# Patient Record
Sex: Male | Born: 1965 | Race: White | Hispanic: No | Marital: Married | Smoking: Current every day smoker
Health system: Southern US, Community
[De-identification: ages and names within clinical notes are randomized; demographics above are authoritative.]

## PROBLEM LIST (undated history)

## (undated) DIAGNOSIS — E669 Obesity, unspecified: Secondary | ICD-10-CM

## (undated) DIAGNOSIS — J302 Other seasonal allergic rhinitis: Secondary | ICD-10-CM

## (undated) DIAGNOSIS — R519 Headache, unspecified: Secondary | ICD-10-CM

## (undated) DIAGNOSIS — E7800 Pure hypercholesterolemia, unspecified: Secondary | ICD-10-CM

## (undated) DIAGNOSIS — I2510 Atherosclerotic heart disease of native coronary artery without angina pectoris: Secondary | ICD-10-CM

## (undated) DIAGNOSIS — G4733 Obstructive sleep apnea (adult) (pediatric): Secondary | ICD-10-CM

## (undated) DIAGNOSIS — I4891 Unspecified atrial fibrillation: Secondary | ICD-10-CM

## (undated) DIAGNOSIS — I4819 Other persistent atrial fibrillation: Secondary | ICD-10-CM

## (undated) DIAGNOSIS — Z8719 Personal history of other diseases of the digestive system: Secondary | ICD-10-CM

## (undated) DIAGNOSIS — I5032 Chronic diastolic (congestive) heart failure: Secondary | ICD-10-CM

## (undated) DIAGNOSIS — R9431 Abnormal electrocardiogram [ECG] [EKG]: Secondary | ICD-10-CM

## (undated) DIAGNOSIS — T4145XA Adverse effect of unspecified anesthetic, initial encounter: Secondary | ICD-10-CM

## (undated) DIAGNOSIS — J189 Pneumonia, unspecified organism: Secondary | ICD-10-CM

## (undated) DIAGNOSIS — K219 Gastro-esophageal reflux disease without esophagitis: Secondary | ICD-10-CM

## (undated) DIAGNOSIS — Z720 Tobacco use: Secondary | ICD-10-CM

## (undated) DIAGNOSIS — I10 Essential (primary) hypertension: Secondary | ICD-10-CM

## (undated) DIAGNOSIS — I219 Acute myocardial infarction, unspecified: Secondary | ICD-10-CM

## (undated) DIAGNOSIS — M1990 Unspecified osteoarthritis, unspecified site: Secondary | ICD-10-CM

## (undated) DIAGNOSIS — I639 Cerebral infarction, unspecified: Secondary | ICD-10-CM

## (undated) DIAGNOSIS — T8859XA Other complications of anesthesia, initial encounter: Secondary | ICD-10-CM

## (undated) DIAGNOSIS — R51 Headache: Secondary | ICD-10-CM

## (undated) HISTORY — DX: Obstructive sleep apnea (adult) (pediatric): G47.33

## (undated) HISTORY — DX: Other persistent atrial fibrillation: I48.19

## (undated) HISTORY — DX: Atherosclerotic heart disease of native coronary artery without angina pectoris: I25.10

## (undated) HISTORY — DX: Chronic diastolic (congestive) heart failure: I50.32

## (undated) HISTORY — DX: Cerebral infarction, unspecified: I63.9

## (undated) HISTORY — PX: ESOPHAGOGASTRODUODENOSCOPY (EGD) WITH ESOPHAGEAL DILATION: SHX5812

## (undated) HISTORY — PX: KNEE ARTHROSCOPY: SUR90

## (undated) HISTORY — DX: Obesity, unspecified: E66.9

## (undated) HISTORY — DX: Unspecified atrial fibrillation: I48.91

---
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
## 1996-11-03 HISTORY — PX: REPAIR OF ESOPHAGUS: SHX6062

## 1999-03-29 ENCOUNTER — Ambulatory Visit: Admission: RE | Admit: 1999-03-29 | Discharge: 1999-03-29 | Payer: Self-pay | Admitting: Unknown Physician Specialty

## 2013-05-03 DIAGNOSIS — I219 Acute myocardial infarction, unspecified: Secondary | ICD-10-CM

## 2013-05-03 HISTORY — DX: Acute myocardial infarction, unspecified: I21.9

## 2013-05-30 HISTORY — PX: CORONARY ANGIOPLASTY WITH STENT PLACEMENT: SHX49

## 2013-11-03 HISTORY — PX: LAPAROSCOPIC CHOLECYSTECTOMY: SUR755

## 2016-01-08 ENCOUNTER — Encounter: Payer: Self-pay | Admitting: Internal Medicine

## 2016-05-12 DIAGNOSIS — I2510 Atherosclerotic heart disease of native coronary artery without angina pectoris: Secondary | ICD-10-CM

## 2016-05-12 DIAGNOSIS — I4891 Unspecified atrial fibrillation: Secondary | ICD-10-CM

## 2016-05-12 DIAGNOSIS — D72829 Elevated white blood cell count, unspecified: Secondary | ICD-10-CM

## 2016-05-12 DIAGNOSIS — Z720 Tobacco use: Secondary | ICD-10-CM

## 2016-05-12 DIAGNOSIS — R0789 Other chest pain: Secondary | ICD-10-CM

## 2016-05-12 DIAGNOSIS — E785 Hyperlipidemia, unspecified: Secondary | ICD-10-CM

## 2016-05-12 DIAGNOSIS — E669 Obesity, unspecified: Secondary | ICD-10-CM

## 2016-05-12 DIAGNOSIS — R7989 Other specified abnormal findings of blood chemistry: Secondary | ICD-10-CM

## 2016-05-12 DIAGNOSIS — I10 Essential (primary) hypertension: Secondary | ICD-10-CM

## 2016-05-13 DIAGNOSIS — I4891 Unspecified atrial fibrillation: Secondary | ICD-10-CM | POA: Diagnosis not present

## 2016-05-13 DIAGNOSIS — R7989 Other specified abnormal findings of blood chemistry: Secondary | ICD-10-CM | POA: Diagnosis not present

## 2016-05-13 DIAGNOSIS — R0789 Other chest pain: Secondary | ICD-10-CM | POA: Diagnosis not present

## 2016-05-13 DIAGNOSIS — D72829 Elevated white blood cell count, unspecified: Secondary | ICD-10-CM | POA: Diagnosis not present

## 2016-05-14 DIAGNOSIS — E669 Obesity, unspecified: Secondary | ICD-10-CM

## 2016-05-14 DIAGNOSIS — E785 Hyperlipidemia, unspecified: Secondary | ICD-10-CM

## 2016-05-14 DIAGNOSIS — I2510 Atherosclerotic heart disease of native coronary artery without angina pectoris: Secondary | ICD-10-CM

## 2016-05-14 DIAGNOSIS — I4891 Unspecified atrial fibrillation: Secondary | ICD-10-CM

## 2016-05-14 DIAGNOSIS — I10 Essential (primary) hypertension: Secondary | ICD-10-CM

## 2016-05-14 DIAGNOSIS — I5043 Acute on chronic combined systolic (congestive) and diastolic (congestive) heart failure: Secondary | ICD-10-CM

## 2016-05-14 DIAGNOSIS — K219 Gastro-esophageal reflux disease without esophagitis: Secondary | ICD-10-CM

## 2016-05-15 DIAGNOSIS — E669 Obesity, unspecified: Secondary | ICD-10-CM | POA: Diagnosis not present

## 2016-05-15 DIAGNOSIS — E785 Hyperlipidemia, unspecified: Secondary | ICD-10-CM | POA: Diagnosis not present

## 2016-05-15 DIAGNOSIS — I2510 Atherosclerotic heart disease of native coronary artery without angina pectoris: Secondary | ICD-10-CM | POA: Diagnosis not present

## 2016-05-15 DIAGNOSIS — I4891 Unspecified atrial fibrillation: Secondary | ICD-10-CM | POA: Diagnosis not present

## 2016-05-16 DIAGNOSIS — I4891 Unspecified atrial fibrillation: Secondary | ICD-10-CM | POA: Diagnosis not present

## 2016-05-16 DIAGNOSIS — E669 Obesity, unspecified: Secondary | ICD-10-CM | POA: Diagnosis not present

## 2016-05-16 DIAGNOSIS — I2510 Atherosclerotic heart disease of native coronary artery without angina pectoris: Secondary | ICD-10-CM | POA: Diagnosis not present

## 2016-05-16 DIAGNOSIS — E785 Hyperlipidemia, unspecified: Secondary | ICD-10-CM | POA: Diagnosis not present

## 2016-05-25 NOTE — H&P (Signed)
OFFICE VISIT NOTES COPIED TO EPIC FOR DOCUMENTATION  . History of Present Illness Neldon Labella AGNP-C; 06/16/2016 10:26 AM) The patient is a 50 year old male, accompanied by his wife during the visit, who presents with atrial fibrillation. He has a history of CAD status post PCI and stent placement to left Cx and OM1 in 2014, hypertension, hyperlipidemia, and recent history of tobacco use, quit in March 2017. Family history significant for premature CAD in both parents, mother had CABG at age 80 and father had MI at age 36. He states he has been told in the past he has paroxysmal atrial fibrillation but was told nothing needed to be done about this. He had reported intermittent episodes of sharp left-sided chest pain since coronary angiogram in 2014. As symptoms have persisted, he was scheduled for stress test in March 2017 and was told this was normal. He presented to the emergency department on 05/12/2016 with a one-month history of progressively worsening shortness of breath with exertion and orthopnea. He had been experiencing chest discomfort on exertion over the previous day. He had been previously evaluated for similar symptoms and diagnosed with bronchitis and treated with antibiotics and steroids. Symptoms continued to worsen prompting reevaluation. He was noted to be in A. fib with RVR and BNP was markedly elevated at 2200. He was discharged home after rate control acheived with diltiazem and carvedilol and he was adequately diuresed. Out patient follow up was scheduled to discuss cardioversion after he had been on anticoagulation for 3 weeks. He was referred here by a friend as he had become discontent with his previous cardiologist he was seeing in [place].  Echocardiogram performed during hospitalization revealed reduced LVEF of 40-45%. He continues to report significant fatigue and dyspnea on exertion, however, no longer has abdominal swelling that he had reported during  hospitalization. Continues to have intermittent episodes of sharp chest pain. No PND, orthopnea, edema, dizziness, syncope, or symptoms suggestive of claudication or TIA. He also has a history of familial hyperlipidemia. Per his report, he has been on multiple statins without any change in LDL level. LDL presently in the 140s on Zetia.    Problem List/Past Medical Anderson Malta Sergeant; 06-16-16 9:15 AM) CAD (coronary artery disease) (I25.10)  Stent in 2014 Afib (I48.91)  CHF (congestive heart failure) (I50.9)  Arthritis of knee (M17.10)  HTN (hypertension) (I10)  Sleep apnea (G47.30)   Allergies Anderson Malta Sergeant; 16-Jun-2016 8:55 AM) Erythromycins   Family History Anderson Malta Sergeant; 06-16-2016 8:57 AM) Mother  Deceased. around age 54, cancer, heart issues Father  Deceased. at age 48, chf Sister 1  older, 2 MI's and TIA, diabetes; pt doesnt known age of when these happened  Social History Anderson Malta Sergeant; 2016/06/16 8:59 AM) Current tobacco use  Former smoker. quit 01/07/2015, smoked for 20 years Non Drinker/No Alcohol Use  Marital status  Married. Living Situation  Lives with spouse. Number of Children  2.  Past Surgical History Anderson Malta Sergeant; June 16, 2016 9:01 AM) Appendectomy  knee surgery  Cholecystectomy 04/2014 esophageal reconstruction  Stents placed 2014  Medication History Anderson Malta Sergeant; June 16, 2016 9:14 AM) Eliquis (5MG  Tablet, 1 Oral two times daily) Active. Carvedilol (6.25MG  Tablet, 1 Oral two times daily) Active. DilTIAZem HCl (60MG  Tablet, 1 Oral every 12 hours) Active. Spironolactone (25MG  Tablet, 1 Oral daily) Active. Torsemide (20MG  Tablet, 1 Oral daily) Active. Zolpidem Tartrate (5MG  Tablet, 1 Oral at night) Active. Fexofenadine HCl (180MG  Tablet, 1 Oral as needed) Active. Dulcolax Stool Softener (10mg  Oral as needed) Specific dose unknown -  Active. Calcium Carbonate (Antacid) (500MG  Tablet Chewable, 1 Oral daily)  Active. Cholecalciferol (2000UNIT Capsule, 1 Oral daily) Active. (Vitamin D3) Cyanocobalamin (1000MCG Tablet, 1 Oral daily) Active. (Vitamin B12) Zetia (10MG  Tablet, 1 Oral daily) Active. Garlic (300MG  Tablet, 1 Oral daily) Active. Lisinopril (2.5MG  Tablet, 1 Oral daily) Active. Magnesium Oxide (250MG  Tablet, 1 Oral daily) Active. Multivital (1 Oral daily) Active. Fish Oil (1,400mg  Oral daily) Specific dose unknown - Active. Tumeric Root Extract (1000mg  daily) Active. B Complex + C TR (1 Oral daily) Active. Zinc (100MG  Tablet, 1 Oral daily) Active. Medications Reconciled (List from hospital present)  Diagnostic Studies History (Bridgette Ebony Hail, AGNP-C; 05/23/2016 4:09 PM) Worthy Keeler  05/23/2016: Creatinine 1.19, potassium 5.6 05/12/2016: CBC normal, creatinine 0.9, potassium 4.1, BNP 2160, total cholesterol 225, triglycerides 99, HDL 60, LDL 145, HbA1c 5.9% Echocardiogram 05/13/2016 Mild LVH, LVEF 40-45%. Left atrium moderately dilated. Nuclear stress test 05/27/2013 Only able to achieve 65% of MPHR, stress EKG inconclusive. Focal antero-apical ischemia. Normal LVEF at 52%. Coronary Angiogram 05/30/2013 3.5 x 18 mm Xience DES to dominant distal left circumflex, 2.5 x 12 mm Xience to OM1. Nuclear Stress Test 04/01/2014 Negative for evidence of ischemia. LVEF 51%. Echocardiogram 04/17/2014 LVEF 55-60%. Left atrium is normal in size. Chest X-ray 01/07/2016 Cardiomegaly and pulmonary vascular congestion    Review of Systems (Bridgette Allison AGNP-C; 05/23/2016 10:22 AM) General Present- Fatigue. Not Present- Anorexia and Fever. Respiratory Present- Decreased Exercise Tolerance and Difficulty Breathing on Exertion. Cardiovascular Present- Chest Pain. Not Present- Claudications, Edema, Orthopnea, Palpitations and Paroxysmal Nocturnal Dyspnea. Gastrointestinal Not Present- Black, Tarry Stool, Change in Bowel Habits and Nausea. Neurological Not Present- Focal Neurological  Symptoms and Syncope. Endocrine Not Present- Cold Intolerance, Excessive Sweating, Heat Intolerance and Thyroid Problems. Hematology Not Present- Anemia, Easy Bruising, Petechiae and Prolonged Bleeding.  Vitals Anderson Malta Sergeant; 05/23/2016 9:30 AM) 05/23/2016 8:47 AM Weight: 238 lb Pulse: 50 (Regular)  P.OX: 95% (Room air) BP: 129/50 (Sitting, Left Arm, Standard)  Assessment & Plan (Bridgette Allison AGNP-C; 05/23/2016 4:15 PM) Paroxysmal atrial fibrillation (I48.0) Impression: EKG 05/23/2016: Atrial fibrillation with controlled ventricular response at a rate of 76 bpm, normal axis, diffuse nonspecific T-wave abnormality. Current Plans Complete electrocardiogram ([id number]) METABOLIC PANEL, BASIC ([id number]) Atherosclerosis of native coronary artery of native heart without angina pectoris (I25.10) Story: Coronary angiogram 05/30/2013: 3.5 x 18 mm Xience DES to dominant distal left circumflex, 2.5 x 12 mm Xience to OM1.  Nuclear stress test 04/01/2014: Negative for evidence of ischemia. LVEF 51%.  Nuclear stress test 05/27/2013: only able to achieve 65% of MPHR, stress EKG inconclusive. Focal antero-apical ischemia. Normal LVEF at 52%. S/P PTCA (percutaneous transluminal coronary angioplasty) (Z98.61) OSA (obstructive sleep apnea) (G47.33) Story: Does not use CPAP Chronic diastolic heart failure (99991111) Story: Echocardiogram 05/13/2016: Mild LVH, LVEF 40-45%. Left atrium moderately dilated.  CXR 01/07/2016: cardiomegaly and pulmonary vascular congestion  Echocardiogram 04/17/2014: LVEF 55-60%. Left atrium is normal in size. Current Plans Discontinued Spironolactone 25MG  (hyperkalemia). Benign essential hypertension (I10)  Current Plans Mechanism of underlying disease process and action of medications discussed with the patient. I discussed primary/secondary prevention. He presents with establish care for continued follow-up and management of atrial fibrillation and recent acute on chronic  diastolic heart failure. Rate is controlled, however given persistent fatigue, will plan for cardioversion to try to reestablish sinus rhythm. This will be scheduled after he has completed 3 weeks of anticoagulation (started eliquis July 10th 2017). I have discussed regarding risks benefits rate control vs rhythm control with the patient. Patient understands  cardiac arrest and need for CPR, aspiration pneumonia, but not limited to these.  I'll obtain records from previous cardiologist to review stress test that was performed in March 2017. Based on these records, we'll consider repeat stress testing versus coronary angiogram if symptoms persist. He has a subjective history of multiple statin failure, able to tolerate, however, was apparently ineffective at lowering LDL. He is presently on Zetia with LDL of 140. Will consider PCSK9 inhibitor if we can prove statin failure. Will discuss this further at follow up after cardioversion.  BMP obtained today reveals hyperkalemia, will discontinue spironolactone.  *I have discussed this case with Dr. Einar Gip and he personally examined the patient and participated in formulating the plan.*   Signed by Neldon Labella, AGNP-C (05/23/2016 4:16 PM)

## 2016-05-27 ENCOUNTER — Encounter (HOSPITAL_COMMUNITY)
Admission: RE | Disposition: A | Discharge status: Home / Self Care | Payer: Self-pay | Source: Ambulatory Visit | Attending: Cardiology

## 2016-05-27 ENCOUNTER — Encounter (HOSPITAL_COMMUNITY): Payer: Self-pay

## 2016-05-27 ENCOUNTER — Ambulatory Visit (HOSPITAL_COMMUNITY): Payer: BLUE CROSS/BLUE SHIELD | Admitting: Certified Registered Nurse Anesthetist

## 2016-05-27 ENCOUNTER — Ambulatory Visit (HOSPITAL_COMMUNITY)
Admission: RE | Admit: 2016-05-27 | Discharge: 2016-05-27 | Disposition: A | Discharge status: Home / Self Care | Payer: BLUE CROSS/BLUE SHIELD | Source: Ambulatory Visit | Attending: Cardiology | Admitting: Cardiology

## 2016-05-27 DIAGNOSIS — G4733 Obstructive sleep apnea (adult) (pediatric): Secondary | ICD-10-CM | POA: Diagnosis not present

## 2016-05-27 DIAGNOSIS — I4891 Unspecified atrial fibrillation: Secondary | ICD-10-CM | POA: Diagnosis not present

## 2016-05-27 DIAGNOSIS — I10 Essential (primary) hypertension: Secondary | ICD-10-CM | POA: Diagnosis not present

## 2016-05-27 DIAGNOSIS — I252 Old myocardial infarction: Secondary | ICD-10-CM | POA: Diagnosis not present

## 2016-05-27 DIAGNOSIS — Z87891 Personal history of nicotine dependence: Secondary | ICD-10-CM | POA: Insufficient documentation

## 2016-05-27 DIAGNOSIS — Z6835 Body mass index (BMI) 35.0-35.9, adult: Secondary | ICD-10-CM | POA: Insufficient documentation

## 2016-05-27 HISTORY — DX: Gastro-esophageal reflux disease without esophagitis: K21.9

## 2016-05-27 HISTORY — DX: Acute myocardial infarction, unspecified: I21.9

## 2016-05-27 HISTORY — DX: Essential (primary) hypertension: I10

## 2016-05-27 HISTORY — DX: Adverse effect of unspecified anesthetic, initial encounter: T41.45XA

## 2016-05-27 HISTORY — DX: Other complications of anesthesia, initial encounter: T88.59XA

## 2016-05-27 HISTORY — PX: CARDIOVERSION: SHX1299

## 2016-05-27 SURGERY — CARDIOVERSION
Anesthesia: Monitor Anesthesia Care

## 2016-05-27 MED ORDER — LIDOCAINE 2% (20 MG/ML) 5 ML SYRINGE
INTRAMUSCULAR | Status: AC
  Filled 2016-05-27: qty 5

## 2016-05-27 MED ORDER — PROPOFOL 10 MG/ML IV BOLUS
INTRAVENOUS | Status: AC
  Filled 2016-05-27: qty 60

## 2016-05-27 MED ORDER — SUCCINYLCHOLINE CHLORIDE 200 MG/10ML IV SOSY
PREFILLED_SYRINGE | INTRAVENOUS | Status: AC
  Filled 2016-05-27: qty 20

## 2016-05-27 MED ORDER — PROPOFOL 10 MG/ML IV BOLUS
INTRAVENOUS | Status: DC | PRN
  Administered 2016-05-27: 60 mg via INTRAVENOUS
  Administered 2016-05-27 (×2): 20 mg via INTRAVENOUS

## 2016-05-27 MED ORDER — SODIUM CHLORIDE 0.9 % IV SOLN
INTRAVENOUS | Status: DC | PRN
  Administered 2016-05-27: 13:00:00 via INTRAVENOUS

## 2016-05-27 MED ORDER — LIDOCAINE 2% (20 MG/ML) 5 ML SYRINGE
INTRAMUSCULAR | Status: AC
  Filled 2016-05-27: qty 10

## 2016-05-27 MED ORDER — EPHEDRINE 5 MG/ML INJ
INTRAVENOUS | Status: AC
  Filled 2016-05-27: qty 10

## 2016-05-27 MED ORDER — PHENYLEPHRINE 40 MCG/ML (10ML) SYRINGE FOR IV PUSH (FOR BLOOD PRESSURE SUPPORT)
PREFILLED_SYRINGE | INTRAVENOUS | Status: AC
  Filled 2016-05-27: qty 10

## 2016-05-27 NOTE — CV Procedure (Signed)
Direct current cardioversion:  Indication symptomatic A. Fibrillation.  Procedure: Using 100 mg of IV Propofol  for achieving deep sedation, synchronized direct current cardioversion performed. Patient was delivered with 120, 150, 200  Joules of electricity X 1 with success to NSR. Patient tolerated the procedure well. No immediate complication noted.

## 2016-05-27 NOTE — Interval H&P Note (Signed)
History and Physical Interval Note:  05/27/2016 1:18 PM  Danny Kelley  has presented today for surgery, with the diagnosis of A FIB   The various methods of treatment have been discussed with the patient and family. After consideration of risks, benefits and other options for treatment, the patient has consented to  Procedure(s): CARDIOVERSION (N/A) as a surgical intervention .  The patient's history has been reviewed, patient examined, no change in status, stable for surgery.  I have reviewed the patient's chart and labs.  Questions were answered to the patient's satisfaction.     [name]

## 2016-05-27 NOTE — Transfer of Care (Signed)
Immediate Anesthesia Transfer of Care Note  [name]  Procedure(s) Performed: Procedure(s): CARDIOVERSION (N/A)  Patient Location: Endoscopy Unit  Anesthesia Type:General  Level of Consciousness: awake, alert , oriented and patient cooperative  Airway & Oxygen Therapy: Patient Spontanous Breathing  Post-op Assessment: Report given to RN and Post -op Vital signs reviewed and stable  Post vital signs: Reviewed and stable  Last Vitals:  Vitals:   05/27/16 1207  BP: 115/76  Pulse: 87  Resp: 20    Last Pain: There were no vitals filed for this visit.       Complications: No apparent anesthesia complications

## 2016-05-27 NOTE — Discharge Instructions (Addendum)
Electrical Cardioversion, Care After °Refer to this sheet in the next few weeks. These instructions provide you with information on caring for yourself after your procedure. Your health care provider may also give you more specific instructions. Your treatment has been planned according to current medical practices, but problems sometimes occur. Call your health care provider if you have any problems or questions after your procedure. °WHAT TO EXPECT AFTER THE PROCEDURE °After your procedure, it is typical to have the following sensations: °· Some redness on the skin where the shocks were delivered. If this is tender, a sunburn lotion or hydrocortisone cream may help. °· Possible return of an abnormal heart rhythm within hours or days after the procedure. °HOME CARE INSTRUCTIONS °· Take medicines only as directed by your health care provider. Be sure you understand how and when to take your medicine. °· Learn how to feel your pulse and check it often. °· Limit your activity for 48 hours after the procedure or as directed by your health care provider. °· Avoid or minimize caffeine and other stimulants as directed by your health care provider. °SEEK MEDICAL CARE IF: °· You feel like your heart is beating too fast or your pulse is not regular. °· You have any questions about your medicines. °· You have bleeding that will not stop. °SEEK IMMEDIATE MEDICAL CARE IF: °· You are dizzy or feel faint. °· It is hard to breathe or you feel short of breath. °· There is a change in discomfort in your chest. °· Your speech is slurred or you have trouble moving an arm or leg on one side of your body. °· You get a serious muscle cramp that does not go away. °· Your fingers or toes turn cold or blue. °  °This information is not intended to replace advice given to you by your health care provider. Make sure you discuss any questions you have with your health care provider. °  °Document Released: 08/10/2013 Document Revised: 11/10/2014  Document Reviewed: 08/10/2013 °Elsevier Interactive Patient Education ©2016 [employer] ° °

## 2016-05-29 NOTE — Anesthesia Postprocedure Evaluation (Signed)
Anesthesia Post Note  [name]  Procedure(s) Performed: Procedure(s) (LRB): CARDIOVERSION (N/A)  Patient location during evaluation: Endoscopy Anesthesia Type: General Level of consciousness: awake Pain management: pain level controlled Vital Signs Assessment: post-procedure vital signs reviewed and stable Respiratory status: spontaneous breathing Cardiovascular status: stable Postop Assessment: no signs of nausea or vomiting Anesthetic complications: no    Last Vitals:  Vitals:   05/27/16 1340 05/27/16 1350  BP: 103/75 119/87  Pulse: 79 84  Resp: 20 17  Temp: 36.6 C     Last Pain:  Vitals:   05/27/16 1340  TempSrc: Oral                 Merwyn Hodapp

## 2016-05-29 NOTE — Anesthesia Preprocedure Evaluation (Signed)
Anesthesia Evaluation  Patient identified by MRN, date of birth, ID band Patient awake    Reviewed: Allergy & Precautions, NPO status , Patient's Chart, lab work & pertinent test results  History of Anesthesia Complications Negative for: history of anesthetic complications  Airway Mallampati: III  TM Distance: >3 FB Neck ROM: Full    Dental  (+) Dental Advisory Given   Pulmonary shortness of breath, sleep apnea , former smoker,    breath sounds clear to auscultation       Cardiovascular hypertension, + Past MI  + dysrhythmias Atrial Fibrillation  Rhythm:Irregular     Neuro/Psych negative neurological ROS  negative psych ROS   GI/Hepatic Neg liver ROS, GERD  Medicated and Controlled,  Endo/Other  Morbid obesity  Renal/GU negative Renal ROS     Musculoskeletal   Abdominal   Peds  Hematology   Anesthesia Other Findings   Reproductive/Obstetrics                             Anesthesia Physical Anesthesia Plan  ASA: III  Anesthesia Plan: General   Post-op Pain Management:    Induction: Intravenous  Airway Management Planned: Mask  Additional Equipment: None  Intra-op Plan:   Post-operative Plan:   Informed Consent: I have reviewed the patients History and Physical, chart, labs and discussed the procedure including the risks, benefits and alternatives for the proposed anesthesia with the patient or authorized representative who has indicated his/her understanding and acceptance.   Dental advisory given  Plan Discussed with: CRNA and Surgeon  Anesthesia Plan Comments:         Anesthesia Quick Evaluation

## 2016-06-09 ENCOUNTER — Other Ambulatory Visit: Payer: Self-pay | Admitting: Cardiology

## 2016-06-09 ENCOUNTER — Observation Stay (HOSPITAL_COMMUNITY)
Admission: RE | Admit: 2016-06-09 | Discharge: 2016-06-11 | Disposition: A | Discharge status: Home / Self Care | Payer: BLUE CROSS/BLUE SHIELD | Source: Ambulatory Visit | Attending: Cardiology | Admitting: Cardiology

## 2016-06-09 ENCOUNTER — Encounter (HOSPITAL_COMMUNITY): Payer: Self-pay | Admitting: General Practice

## 2016-06-09 ENCOUNTER — Institutional Professional Consult (permissible substitution): Payer: BLUE CROSS/BLUE SHIELD | Admitting: Neurology

## 2016-06-09 DIAGNOSIS — I2510 Atherosclerotic heart disease of native coronary artery without angina pectoris: Secondary | ICD-10-CM | POA: Diagnosis not present

## 2016-06-09 DIAGNOSIS — Z87891 Personal history of nicotine dependence: Secondary | ICD-10-CM | POA: Insufficient documentation

## 2016-06-09 DIAGNOSIS — G4733 Obstructive sleep apnea (adult) (pediatric): Secondary | ICD-10-CM | POA: Insufficient documentation

## 2016-06-09 DIAGNOSIS — I110 Hypertensive heart disease with heart failure: Secondary | ICD-10-CM | POA: Insufficient documentation

## 2016-06-09 DIAGNOSIS — Z8249 Family history of ischemic heart disease and other diseases of the circulatory system: Secondary | ICD-10-CM | POA: Insufficient documentation

## 2016-06-09 DIAGNOSIS — E785 Hyperlipidemia, unspecified: Secondary | ICD-10-CM | POA: Diagnosis not present

## 2016-06-09 DIAGNOSIS — Z955 Presence of coronary angioplasty implant and graft: Secondary | ICD-10-CM | POA: Insufficient documentation

## 2016-06-09 DIAGNOSIS — I252 Old myocardial infarction: Secondary | ICD-10-CM | POA: Diagnosis not present

## 2016-06-09 DIAGNOSIS — I4891 Unspecified atrial fibrillation: Secondary | ICD-10-CM | POA: Diagnosis present

## 2016-06-09 DIAGNOSIS — I480 Paroxysmal atrial fibrillation: Secondary | ICD-10-CM | POA: Diagnosis present

## 2016-06-09 DIAGNOSIS — Z7901 Long term (current) use of anticoagulants: Secondary | ICD-10-CM | POA: Insufficient documentation

## 2016-06-09 DIAGNOSIS — I5032 Chronic diastolic (congestive) heart failure: Secondary | ICD-10-CM | POA: Insufficient documentation

## 2016-06-09 LAB — BASIC METABOLIC PANEL
Anion gap: 6 (ref 5–15)
BUN: 21 mg/dL — ABNORMAL HIGH (ref 6–20)
CALCIUM: 8.8 mg/dL — AB (ref 8.9–10.3)
CO2: 27 mmol/L (ref 22–32)
CREATININE: 0.85 mg/dL (ref 0.61–1.24)
Chloride: 107 mmol/L (ref 101–111)
Glucose, Bld: 109 mg/dL — ABNORMAL HIGH (ref 65–99)
Potassium: 3.6 mmol/L (ref 3.5–5.1)
SODIUM: 140 mmol/L (ref 135–145)

## 2016-06-09 MED ORDER — SODIUM CHLORIDE 0.9% FLUSH: 3.0000 mL | INTRAVENOUS | Status: DC | PRN

## 2016-06-09 MED ORDER — SODIUM CHLORIDE 0.9% FLUSH
3.0000 mL | Freq: Two times a day (BID) | INTRAVENOUS | Status: DC
  Administered 2016-06-09 – 2016-06-11 (×4): 3 mL via INTRAVENOUS

## 2016-06-09 MED ORDER — ONDANSETRON HCL 4 MG/2ML IJ SOLN: 4.0000 mg | Freq: Four times a day (QID) | INTRAMUSCULAR | Status: DC | PRN

## 2016-06-09 MED ORDER — APIXABAN 5 MG PO TABS
5.0000 mg | ORAL_TABLET | Freq: Two times a day (BID) | ORAL | Status: DC
  Administered 2016-06-09 – 2016-06-11 (×4): 5 mg via ORAL
  Filled 2016-06-09 (×4): qty 1

## 2016-06-09 MED ORDER — SODIUM CHLORIDE 0.9 % IV SOLN: 250.0000 mL | INTRAVENOUS | Status: DC | PRN

## 2016-06-09 MED ORDER — TORSEMIDE 20 MG PO TABS
20.0000 mg | ORAL_TABLET | Freq: Every day | ORAL | Status: DC
  Administered 2016-06-10 – 2016-06-11 (×2): 20 mg via ORAL
  Filled 2016-06-09 (×2): qty 1

## 2016-06-09 MED ORDER — ZOLPIDEM TARTRATE 5 MG PO TABS
5.0000 mg | ORAL_TABLET | Freq: Every day | ORAL | Status: DC
  Administered 2016-06-09: 5 mg via ORAL
  Filled 2016-06-09 (×2): qty 1

## 2016-06-09 MED ORDER — EZETIMIBE 10 MG PO TABS
10.0000 mg | ORAL_TABLET | Freq: Every day | ORAL | Status: DC
  Administered 2016-06-10 – 2016-06-11 (×2): 10 mg via ORAL
  Filled 2016-06-09 (×2): qty 1

## 2016-06-09 MED ORDER — ROSUVASTATIN CALCIUM 10 MG PO TABS
20.0000 mg | ORAL_TABLET | Freq: Every day | ORAL | Status: DC
  Administered 2016-06-10 – 2016-06-11 (×2): 20 mg via ORAL
  Filled 2016-06-09 (×2): qty 2

## 2016-06-09 MED ORDER — SOTALOL HCL 80 MG PO TABS
80.0000 mg | ORAL_TABLET | Freq: Two times a day (BID) | ORAL | Status: DC
  Administered 2016-06-09 – 2016-06-10 (×2): 80 mg via ORAL
  Filled 2016-06-09 (×2): qty 1

## 2016-06-09 MED ORDER — ACETAMINOPHEN 325 MG PO TABS: 650.0000 mg | ORAL_TABLET | ORAL | Status: DC | PRN

## 2016-06-09 NOTE — Progress Notes (Signed)
Pt has arrived to [facility] as a direct admit. Telemetry box has been applied and CCMD has been notified. IV team has been consulted to start an IV. Pt reported he is a difficult stick and would like IV team to start IV. MD and admission nurse have been notified of pt's arrival. Pt oriented to room. Will continue to monitor.   Grant Fontana BSN, RN

## 2016-06-09 NOTE — H&P (Signed)
Danny Kelley is an 50 y.o. male.   Chief Complaint: Fatigue HPI: [name]  is a 50 y.o. male  With history of CAD status post PCI and stent placement to left Cx and OM1 in 2014, hypertension, hyperlipidemia, and recent history of tobacco use, quit in March 2017. Family history significant for premature CAD in both parents, mother had CABG at age 55 and father had MI at age 27. He has obstructive sleep apnea but has not been using CPAP, now scheduled for sleep study.  He has history of paroxysmal atrial fibrillation, hypertension, hyperlipidemia. Patient underwent direct current cardioversion on 05/27/2016, felt well for almost a week until 2 days ago suddenly felt marked generalized weakness and thought that he was back in atrial fibrillation on his OV on 06/05/2016. After long discussion, we decided to initiate Sotalol therapy to see if we could maintain sinus rhythm and now being admitted for therapeutic drug administration.   No new symptoms, patient otherwise doing well.  Denies any chest pain, shortness of breath has been stable, no PND or orthopnea.  No dizziness or syncope.  Tolerating and correlation with Eliquis with the reading diathesis.  Past Medical History:  Diagnosis Date  . Complication of anesthesia    Pt reports "they have a hard time waking me up"  . Coronary artery disease   . GERD (gastroesophageal reflux disease)   . Hypertension   . Myocardial infarction [facility])    June 2014; stents: left PDA, 1st OM  . Paroxysmal atrial fibrillation (HCC)   . Shortness of breath dyspnea   . Sleep apnea     Past Surgical History:  Procedure Laterality Date  . APPENDECTOMY  1984  . CARDIAC CATHETERIZATION    . CARDIOVERSION N/A 05/27/2016   Procedure: CARDIOVERSION;  Surgeon: [name], MD;  Location: [facility];  Service: Cardiovascular;  Laterality: N/A;  . CHOLECYSTECTOMY  2015  . CORONARY STENT PLACEMENT  04/19/2013  . ESOPHAGOGASTRODUODENOSCOPY (EGD) WITH ESOPHAGEAL  DILATION    . KNEE ARTHROSCOPY  1986  . REPAIR OF ESOPHAGUS  1998   perforation of the distal esophagus    History reviewed. No pertinent family history. Social History:  reports that he quit smoking about 4 months ago. His smoking use included Cigarettes and E-cigarettes. He has never used smokeless tobacco. He reports that he does not drink alcohol or use drugs.  Allergies:  Allergies  Allergen Reactions  . Erythromycin Other (See Comments)    Hallucinations     Review of Systems - Negative except fatigue and mild dyspnea  Blood pressure 128/66, pulse 82, temperature 98.3 F (36.8 C), temperature source Oral, resp. rate 18, height '5\' 10"'  (1.778 m), weight 107.7 kg (237 lb 8 oz), SpO2 98 %.   Body mass index is 34.08 kg/m.   General appearance: alert, appears older than stated age, no distress and moderately obese Eyes: negative findings: lids and lashes normal Neck: no adenopathy, no carotid bruit, no JVD, supple, symmetrical, trachea midline and thyroid not enlarged, symmetric, no tenderness/mass/nodules Neck: JVP - normal, carotids 2+= without bruits Resp: clear to auscultation bilaterally Chest wall: no tenderness Cardio: irregularly irregular rhythm, S1, S2 normal and no S3 or S4 GI: soft, non-tender; bowel sounds normal; no masses,  no organomegaly Extremities: extremities normal, atraumatic, no cyanosis or edema Pulses: 2+ and symmetric Skin: Skin color, texture, turgor normal. No rashes or lesions Neurologic: Grossly normal  Results for orders placed or performed during the hospital encounter of 06/09/16 (from  the past 48 hour(s))  Basic metabolic panel     Status: Abnormal   Collection Time: 06/09/16  4:27 PM  Result Value Ref Range   Sodium 140 135 - 145 mmol/L   Potassium 3.6 3.5 - 5.1 mmol/L   Chloride 107 101 - 111 mmol/L   CO2 27 22 - 32 mmol/L   Glucose, Bld 109 (H) 65 - 99 mg/dL   BUN 21 (H) 6 - 20 mg/dL   Creatinine, Ser 0.85 0.61 - 1.24 mg/dL    Calcium 8.8 (L) 8.9 - 10.3 mg/dL   GFR calc non Af Amer >60 >60 mL/min   GFR calc Af Amer >60 >60 mL/min    Comment: (NOTE) The eGFR has been calculated using the CKD EPI equation. This calculation has not been validated in all clinical situations. eGFR's persistently <60 mL/min signify possible Chronic Kidney Disease.    Anion gap 6 5 - 15   EKG (office) 06/05/2016: Atrial fibrillation with controlled ventricular response at the rate of 91 bpm, normal axis, diffuse nonspecific T-wave abnormality.  Echo Oval [place]) 05/13/2016: Mild LVH, LVEF 40-45%.  Left atrium moderately dilated.  Lexiscan sestamibi 01/08/2016: Normal sinus rhythm, nondiagnostic stress EKG, fixed apical defect insistent with scar, apical hypokinesis. LVEF 36%. No evidence of ischemia.  Coronary angiogram 05/30/2013: 3.5 x 18 mm Xience DES to dominant distal left circumflex, 2.5 x 12 mm Xience to OM1.   Medications Prior to Admission  Medication Sig Dispense Refill  . apixaban (ELIQUIS) 5 MG TABS tablet Take 5 mg by mouth 2 (two) times daily.    Marland Kitchen b complex vitamins tablet Take 1 tablet by mouth daily.    . carvedilol (COREG) 6.25 MG tablet Take 6.25 mg by mouth 2 (two) times daily with a meal.    . diltiazem (CARDIZEM) 60 MG tablet Take 60 mg by mouth 2 (two) times daily.    Marland Kitchen ezetimibe (ZETIA) 10 MG tablet Take 10 mg by mouth daily.    . Flaxseed, Linseed, (FLAX SEED OIL PO) Take 2 capsules by mouth daily.    Marland Kitchen MAGNESIUM PO Take 1 tablet by mouth daily.    . Omega-3 Fatty Acids (FISH OIL) 1200 MG CAPS Take 1,200 mg by mouth daily.    . rosuvastatin (CRESTOR) 20 MG tablet Take 20 mg by mouth daily.    Marland Kitchen torsemide (DEMADEX) 20 MG tablet Take 20 mg by mouth daily.    Marland Kitchen zolpidem (AMBIEN) 5 MG tablet Take 5 mg by mouth at bedtime.        Current Facility-Administered Medications:  .  0.9 %  sodium chloride infusion, 250 mL, Intravenous, PRN, [name], MD .  acetaminophen (TYLENOL) tablet 650 mg, 650 mg, Oral, Q4H PRN,  [name], MD .  apixaban (ELIQUIS) tablet 5 mg, 5 mg, Oral, BID, [name], MD .  ondansetron [facility]) injection 4 mg, 4 mg, Intravenous, Q6H PRN, [name], MD .  sodium chloride flush (NS) 0.9 % injection 3 mL, 3 mL, Intravenous, Q12H, [name], MD .  sodium chloride flush (NS) 0.9 % injection 3 mL, 3 mL, Intravenous, PRN, [name], MD .  sotalol (BETAPACE) tablet 80 mg, 80 mg, Oral, Q12H, [name], MD, 80 mg at 06/09/16 1728  Assessment/Plan Paroxysmal atrial fibrillation  Direct current cardioversion 05/27/2016: 120J, 150J & 200J, A. Fib to NSR.  Atherosclerosis of native coronary artery of native heart without angina pectoris   Chronic diastolic heart failure  OSA (obstructive sleep apnea)   Hypertension  Hyperlipidemia  Rec: After long discussions regarding proceeding with antiarrhythmic therapy cessation and reattempting cardioversion, patient is willing to be admitted to the hospital today, I will initiate sotalol.  His other option would be Tikosyn or amiodarone, given his young age of leg avoid amiodarone.  Blood pressure is well controlled, she is now been started on the prednisone therapy recently and will eventually need with profile testing in a month or so.  Encouraged therapy and management of sleep apnea with use of CPAP.  [name], MD 06/09/2016, 8:38 PM [facility] [place] Pager: [phone] Office: [phone] If no answer: Cell:  [phone]

## 2016-06-10 DIAGNOSIS — G4733 Obstructive sleep apnea (adult) (pediatric): Secondary | ICD-10-CM | POA: Diagnosis not present

## 2016-06-10 DIAGNOSIS — E785 Hyperlipidemia, unspecified: Secondary | ICD-10-CM | POA: Diagnosis not present

## 2016-06-10 DIAGNOSIS — I480 Paroxysmal atrial fibrillation: Secondary | ICD-10-CM | POA: Diagnosis not present

## 2016-06-10 DIAGNOSIS — I5032 Chronic diastolic (congestive) heart failure: Secondary | ICD-10-CM | POA: Diagnosis not present

## 2016-06-10 MED ORDER — SOTALOL HCL 80 MG PO TABS
80.0000 mg | ORAL_TABLET | Freq: Once | ORAL | Status: AC
  Administered 2016-06-10: 80 mg via ORAL
  Filled 2016-06-10: qty 1

## 2016-06-10 MED ORDER — SOTALOL HCL 80 MG PO TABS
120.0000 mg | ORAL_TABLET | Freq: Two times a day (BID) | ORAL | Status: DC
  Administered 2016-06-10 – 2016-06-11 (×3): 120 mg via ORAL
  Filled 2016-06-10 (×3): qty 2

## 2016-06-10 NOTE — Progress Notes (Signed)
Danny Kelley at [place] at 0.35. Will administer Sotalol as ordered.

## 2016-06-10 NOTE — Progress Notes (Signed)
At about 1444, Pt stated "I can't catch my breath, it comes and goes, and I feel dizzy." Blood pressure 111/84, pulse (!) 138, temperature 98.5 F (36.9 C), temperature source Oral, resp. rate 20, height 5\' 10"  (1.778 m), weight 107.7 kg (237 lb 8 oz), SpO2 100 %.  [name], MD notified with one time order of Sotalol 80mg  and to give 1700 dose late. Will continue to monitor.

## 2016-06-11 ENCOUNTER — Encounter (HOSPITAL_COMMUNITY)
Admission: RE | Disposition: A | Discharge status: Home / Self Care | Payer: Self-pay | Source: Ambulatory Visit | Attending: Cardiology

## 2016-06-11 ENCOUNTER — Encounter (HOSPITAL_COMMUNITY): Payer: Self-pay

## 2016-06-11 ENCOUNTER — Observation Stay (HOSPITAL_COMMUNITY): Payer: BLUE CROSS/BLUE SHIELD | Admitting: Anesthesiology

## 2016-06-11 ENCOUNTER — Other Ambulatory Visit: Payer: Self-pay

## 2016-06-11 DIAGNOSIS — I480 Paroxysmal atrial fibrillation: Secondary | ICD-10-CM | POA: Diagnosis not present

## 2016-06-11 DIAGNOSIS — E785 Hyperlipidemia, unspecified: Secondary | ICD-10-CM | POA: Diagnosis not present

## 2016-06-11 DIAGNOSIS — G4733 Obstructive sleep apnea (adult) (pediatric): Secondary | ICD-10-CM | POA: Diagnosis not present

## 2016-06-11 DIAGNOSIS — I5032 Chronic diastolic (congestive) heart failure: Secondary | ICD-10-CM | POA: Diagnosis not present

## 2016-06-11 HISTORY — PX: CARDIOVERSION: SHX1299

## 2016-06-11 LAB — MAGNESIUM: MAGNESIUM: 2.2 mg/dL (ref 1.7–2.4)

## 2016-06-11 LAB — BASIC METABOLIC PANEL
ANION GAP: 5 (ref 5–15)
BUN: 14 mg/dL (ref 6–20)
CO2: 27 mmol/L (ref 22–32)
Calcium: 8.8 mg/dL — ABNORMAL LOW (ref 8.9–10.3)
Chloride: 104 mmol/L (ref 101–111)
Creatinine, Ser: 0.82 mg/dL (ref 0.61–1.24)
GFR calc non Af Amer: >60 mL/min (ref >60)
GLUCOSE: 111 mg/dL — AB (ref 65–99)
POTASSIUM: 3.8 mmol/L (ref 3.5–5.1)
Sodium: 136 mmol/L (ref 135–145)

## 2016-06-11 SURGERY — CARDIOVERSION
Anesthesia: General

## 2016-06-11 MED ORDER — IRBESARTAN 150 MG PO TABS
75.0000 mg | ORAL_TABLET | Freq: Every day | ORAL | Status: DC
  Administered 2016-06-11: 75 mg via ORAL
  Filled 2016-06-11: qty 1

## 2016-06-11 MED ORDER — SOTALOL HCL 120 MG PO TABS: 120.0000 mg | ORAL_TABLET | Freq: Two times a day (BID) | ORAL | 2 refills | Status: DC

## 2016-06-11 MED ORDER — LIDOCAINE HCL (CARDIAC) 20 MG/ML IV SOLN
INTRAVENOUS | Status: DC | PRN
  Administered 2016-06-11: 100 mg via INTRAVENOUS

## 2016-06-11 MED ORDER — VALSARTAN-HYDROCHLOROTHIAZIDE 160-12.5 MG PO TABS: 1.0000 | ORAL_TABLET | Freq: Every day | ORAL | 1 refills | Status: DC

## 2016-06-11 MED ORDER — TORSEMIDE 20 MG PO TABS: 20.0000 mg | ORAL_TABLET | Freq: Every day | ORAL | Status: DC | PRN

## 2016-06-11 MED ORDER — SODIUM CHLORIDE 0.9 % IV SOLN
INTRAVENOUS | Status: DC | PRN
  Administered 2016-06-11: 11:00:00 via INTRAVENOUS

## 2016-06-11 MED ORDER — PROPOFOL 10 MG/ML IV BOLUS
INTRAVENOUS | Status: DC | PRN
Start: 2016-06-11 — End: 2016-06-11
  Administered 2016-06-11: 100 mg via INTRAVENOUS

## 2016-06-11 NOTE — Progress Notes (Signed)
Danny Kelley to be D/C'd Home per MD order. Discussed with the patient and all questions fully answered.    Medication List    STOP taking these medications   carvedilol 6.25 MG tablet Commonly known as:  COREG   diltiazem 60 MG tablet Commonly known as:  CARDIZEM     TAKE these medications   b complex vitamins tablet Take 1 tablet by mouth daily.   ELIQUIS 5 MG Tabs tablet Generic drug:  apixaban Take 5 mg by mouth 2 (two) times daily.   ezetimibe 10 MG tablet Commonly known as:  ZETIA Take 10 mg by mouth daily.   Fish Oil 1200 MG Caps Take 1,200 mg by mouth daily.   FLAX SEED OIL PO Take 2 capsules by mouth daily.   MAGNESIUM PO Take 1 tablet by mouth daily.   rosuvastatin 20 MG tablet Commonly known as:  CRESTOR Take 20 mg by mouth daily.   sotalol 120 MG tablet Commonly known as:  BETAPACE Take 1 tablet (120 mg total) by mouth every 12 (twelve) hours.   torsemide 20 MG tablet Commonly known as:  DEMADEX Take 1 tablet (20 mg total) by mouth daily as needed. Take only if leg swelling or shortness of breath What changed:  when to take this  reasons to take this  additional instructions   valsartan-hydrochlorothiazide 160-12.5 MG tablet Commonly known as:  DIOVAN HCT Take 1 tablet by mouth daily.   zolpidem 5 MG tablet Commonly known as:  AMBIEN Take 5 mg by mouth at bedtime.       VVS, Skin clean, dry and intact without evidence of skin break down, no evidence of skin tears noted.  IV catheter discontinued intact. Site without signs and symptoms of complications. Dressing and pressure applied.  An After Visit Summary was printed and given to the patient.  Patient escorted via [place], and D/C home via private auto.  Cyndra Numbers  06/11/2016 5:08 PM

## 2016-06-11 NOTE — Anesthesia Preprocedure Evaluation (Addendum)
Anesthesia Evaluation  Patient identified by MRN, date of birth, ID band Patient awake    Reviewed: Allergy & Precautions, NPO status , Patient's Chart, lab work & pertinent test results  History of Anesthesia Complications Negative for: history of anesthetic complications  Airway Mallampati: II  TM Distance: >3 FB     Dental  (+) Teeth Intact   Pulmonary shortness of breath, sleep apnea , Current Smoker, former smoker,  Quit smoking March 2017   Pulmonary exam normal        Cardiovascular Exercise Tolerance: Poor hypertension, + CAD and + Past MI   Rhythm:Irregular Rate:Abnormal     Neuro/Psych    GI/Hepatic GERD  ,  Endo/Other    Renal/GU      Musculoskeletal   Abdominal (+) + obese,  Abdomen: soft.    Peds  Hematology   Anesthesia Other Findings   Reproductive/Obstetrics                            Anesthesia Physical Anesthesia Plan  ASA: III  Anesthesia Plan: General   Post-op Pain Management:    Induction: Intravenous  Airway Management Planned: Mask  Additional Equipment:   Intra-op Plan:   Post-operative Plan:   Informed Consent: I have reviewed the patients History and Physical, chart, labs and discussed the procedure including the risks, benefits and alternatives for the proposed anesthesia with the patient or authorized representative who has indicated his/her understanding and acceptance.     Plan Discussed with: CRNA and Surgeon  Anesthesia Plan Comments:        Anesthesia Quick Evaluation

## 2016-06-11 NOTE — CV Procedure (Signed)
Direct current cardioversion:  Indication symptomatic A. Fibrillation.  Procedure: Using 100 mg of IV Propofol and 100 IV Lidocaine (for reducing venous pain) for achieving deep sedation, synchronized direct current cardioversion performed. Patient was delivered with 150 x 2 then 200  Joules of electricity X 1 with success to NSR. Patient tolerated the procedure well. No immediate complication noted.

## 2016-06-11 NOTE — Interval H&P Note (Signed)
History and Physical Interval Note:  06/11/2016 12:01 PM  Danny Kelley  has presented today for surgery, with the diagnosis of afib  The various methods of treatment have been discussed with the patient and family. After consideration of risks, benefits and other options for treatment, the patient has consented to  Procedure(s): CARDIOVERSION (N/A) as a surgical intervention .  The patient's history has been reviewed, patient examined, no change in status, stable for surgery.  I have reviewed the patient's chart and labs.  Questions were answered to the patient's satisfaction.     [name]

## 2016-06-11 NOTE — Discharge Summary (Signed)
Physician Discharge Summary  Patient ID: Danny Kelley MRN: [id number] DOB/AGE: 48-Apr-1967 48 y.o.  Admit date: 06/09/2016 Discharge date: 06/11/2016  Primary Discharge Diagnosis 1. Symptomatic PAF. Direct current cardioversion 05/27/2016: 120J, 150J & 200J, A. Fib to NSR. However back in A. Fib on f/u. S/P repeat cardioversion 06/11/2016 on Sotalol to sinus rhythm. CHA2DS2-VASCScore: Risk Score  2,  Yearly risk of stroke  2.2. Recommendation: Anticoagulation Yes  2. Malaise and fatigue and dyspnea 3. Chronic diastolic heart failure 4. OSA sleep study being persued. 5. Hypertension 6. Hyperlipidemia 7. Moderate Obesity. 8. CAD native vessel without angina.  Coronary angiogram 05/30/2013: 3.5 x 18 mm Xience DES to dominant distal left circumflex, 2.5 x 12 mm Xience to OM1. 58. Family h/o premature CAD, in both parents, mother had CABG at age 27 and father had MI at age 53.   Hospital Course:  Danny Kelley  is a 50 y.o. male  With history of CAD status post PCI and stent placement to left Cx and OM1 in 2014, hypertension, hyperlipidemia, and recent history of tobacco use, quit in March 2017. Family history significant for premature CAD in both parents, mother had CABG at age 45 and father had MI at age 60. He has obstructive sleep apnea but has not been using CPAP, now scheduled for sleep study.  He has history of paroxysmal atrial fibrillation, hypertension, hyperlipidemia. OSA not on thearpy, admitted for therapeutic drug administration for sotalol. After he received 5 escalating doses, underwent successful cardioversion on 06/11/16, felt stable for discharge.   Recommendations on discharge: Weight loss, keep sleep study appointment, sotalol and valsartan new and make torsemide PRN. Return to work 06/13/2016.  Discharge Exam: Blood pressure 127/87, pulse 71, temperature 97.7 F (36.5 C), resp. rate 11, height 5\' 10"  (1.778 m), weight 107.7 kg (237 lb 8 oz), SpO2 96 %.  General appearance:  alert, appears older than stated age, no distress and moderately obese Eyes: negative findings: lids and lashes normal Neck: no adenopathy, no carotid bruit, no JVD, supple, symmetrical, trachea midline and thyroid not enlarged, symmetric, no tenderness/mass/nodules Neck: JVP - normal, carotids 2+= without bruits Resp: clear to auscultation bilaterally Chest wall: no tenderness Cardio: regularly rhythm, S1, S2 normal and no S3 or S4 GI: soft, non-tender; bowel sounds normal; no masses,  no organomegaly Extremities: extremities normal, atraumatic, no cyanosis or edema Pulses: 2+ and symmetric Skin: Skin color, texture, turgor normal. No rashes or lesions Neurologic: Grossly normal  Labs:  Recent Labs Lab 06/11/16 1352  NA 136  K 3.8  CL 104  CO2 27  BUN 14  CREATININE 0.82  CALCIUM 8.8*  GLUCOSE 111*   EKG (office) 06/05/2016: Atrial fibrillation with controlled ventricular response at the rate of 91 bpm, normal axis, diffuse nonspecific T-wave abnormality.  EKG 06/11/2016: Normal sinus rhythm at rate of 72 bpm, leftward axis, normal EKG.  No evidence of ischemia, normal QT interval  Echo Oval Linsey) 05/13/2016: Mild LVH, LVEF 40-45%.  Left atrium moderately dilated.  Lexiscan sestamibi 01/08/2016: Normal sinus rhythm, nondiagnostic stress EKG, fixed apical defect insistent with scar, apical hypokinesis. LVEF 36%. No evidence of ischemia.  Coronary angiogram 05/30/2013: 3.5 x 18 mm Xience DES to dominant distal left circumflex, 2.5 x 12 mm Xience to OM1.  FOLLOW UP PLANS AND APPOINTMENTS    Medication List    STOP taking these medications   carvedilol 6.25 MG tablet Commonly known as:  COREG   diltiazem 60 MG tablet Commonly known as:  CARDIZEM  TAKE these medications   b complex vitamins tablet Take 1 tablet by mouth daily.   ELIQUIS 5 MG Tabs tablet Generic drug:  apixaban Take 5 mg by mouth 2 (two) times daily.   ezetimibe 10 MG tablet Commonly known as:   ZETIA Take 10 mg by mouth daily.   Fish Oil 1200 MG Caps Take 1,200 mg by mouth daily.   FLAX SEED OIL PO Take 2 capsules by mouth daily.   MAGNESIUM PO Take 1 tablet by mouth daily.   rosuvastatin 20 MG tablet Commonly known as:  CRESTOR Take 20 mg by mouth daily.   sotalol 120 MG tablet Commonly known as:  BETAPACE Take 1 tablet (120 mg total) by mouth every 12 (twelve) hours.   torsemide 20 MG tablet Commonly known as:  DEMADEX Take 1 tablet (20 mg total) by mouth daily as needed. Take only if leg swelling or shortness of breath What changed:  when to take this  reasons to take this  additional instructions   valsartan-hydrochlorothiazide 160-12.5 MG tablet Commonly known as:  DIOVAN HCT Take 1 tablet by mouth daily.   zolpidem 5 MG tablet Commonly known as:  AMBIEN Take 5 mg by mouth at bedtime.       [name], MD 06/11/2016, 3:45 PM  Pager: [phone] Office: [phone] If no answer: [phone]

## 2016-06-11 NOTE — Anesthesia Postprocedure Evaluation (Signed)
Anesthesia Post Note  [name]  Procedure(s) Performed: Procedure(s) (LRB): CARDIOVERSION (N/A)  Patient location during evaluation: PACU Anesthesia Type: General Level of consciousness: awake and alert Pain management: pain level controlled Vital Signs Assessment: post-procedure vital signs reviewed and stable Respiratory status: spontaneous breathing, nonlabored ventilation, respiratory function stable and patient connected to nasal cannula oxygen Cardiovascular status: blood pressure returned to baseline and stable Postop Assessment: no signs of nausea or vomiting Anesthetic complications: no    Last Vitals:  Vitals:   06/11/16 1052 06/11/16 1218  BP: (!) 138/112   Pulse: 86 72  Resp: 11 15  Temp:  36.5 C    Last Pain:  Vitals:   06/11/16 1052  TempSrc: Oral                 Lonette Stevison DAVID

## 2016-06-11 NOTE — H&P (View-Only) (Signed)
Late entry from yesterday  Subjective:  No specific complaints. Wife present at bedside and is wondering why he is in A. Fib.  Objective:  Vital Signs in the last 24 hours: Temp:  [97.8 F (36.6 C)-98.5 F (36.9 C)] 98.3 F (36.8 C) (08/09 0624) Pulse Rate:  [76-138] 76 (08/09 0624) Resp:  [18-20] 20 (08/09 0624) BP: (111-135)/(84-92) 116/92 (08/09 0624) SpO2:  [96 %-100 %] 96 % (08/09 0624)  Intake/Output from previous day: 08/08 0701 - 08/09 0700 In: 1203 [P.O.:1200; I.V.:3] Out: -    Blood pressure (!) 116/92, pulse 76, temperature 98.3 F (36.8 C), temperature source Oral, resp. rate 20, height 5\' 10"  (1.778 m), weight 107.7 kg (237 lb 8 oz), SpO2 96 %.   Physical Exam:  General appearance: alert, appears older than stated age, no distress and moderately obese Eyes: negative findings: lids and lashes normal Neck: no adenopathy, no carotid bruit, no JVD, supple, symmetrical, trachea midline and thyroid not enlarged, symmetric, no tenderness/mass/nodules Neck: JVP - normal, carotids 2+= without bruits Resp: clear to auscultation bilaterally Chest wall: no tenderness Cardio: irregularly irregular rhythm, S1, S2 normal and no S3 or S4 GI: soft, non-tender; bowel sounds normal; no masses,  no organomegaly Extremities: extremities normal, atraumatic, no cyanosis or edema Pulses: 2+ and symmetric Lab Results: BMP  Recent Labs  06/09/16 1627  NA 140  K 3.6  CL 107  CO2 27  GLUCOSE 109*  BUN 21*  CREATININE 0.85  CALCIUM 8.8*  GFRNONAA >60  GFRAA >60   Cardiac Studies:  EKG:06/10/2016: A. Fib with CVR @ 78/min. LAD, non specific T abnormality.   Echo Oval Linsey) 05/13/2016: Mild LVH, LVEF 40-45%.  Left atrium moderately dilated.  Lexiscan sestamibi 01/08/2016 Oval Linsey): Normal sinus rhythm, nondiagnostic stress EKG, fixed apical defect insistent with scar, apical hypokinesis. LVEF 36%. No evidence of ischemia.  Coronary angiogram 05/30/2013: 3.5 x 18 mm Xience DES  to dominant distal left circumflex, 2.5 x 12 mm Xience to OM1.  Assessment/Plan:  1. Symptomatic PAF. Direct current cardioversion 05/27/2016: 120J, 150J & 200J, A. Fib to NSR. However back in A. Fib on f/u. CHA2DS2-VASCScore: Risk Score  2,  Yearly risk of stroke  2.2. Recommendation: Anticoagulation Yes  2. Malaise and fatigue and dyspnea 3. Chronic diastolic heart failure 4. OSA sleep study being persued. 5. Hypertension 6. Hyperlipidemia 7. Moderate Obesity. 8. CAD native vessel without angina.  Rec: For cardioversion tomorrow. Increase Sotalol to 120 mg BID as patient has had break through RVR on tele. No NSVT, QT stable. Discussed with patient and wife regarding A. Fib risk factors.    [name], M.D. 06/11/2016, 7:09 AM [facility] Pager: [phone] Office: [phone] If no answer: [phone]

## 2016-06-11 NOTE — Progress Notes (Signed)
Late entry from yesterday  Subjective:  No specific complaints. Wife present at bedside and is wondering why he is in A. Fib.  Objective:  Vital Signs in the last 24 hours: Temp:  [97.8 F (36.6 C)-98.5 F (36.9 C)] 98.3 F (36.8 C) (08/09 0624) Pulse Rate:  [76-138] 76 (08/09 0624) Resp:  [18-20] 20 (08/09 0624) BP: (111-135)/(84-92) 116/92 (08/09 0624) SpO2:  [96 %-100 %] 96 % (08/09 0624)  Intake/Output from previous day: 08/08 0701 - 08/09 0700 In: 1203 [P.O.:1200; I.V.:3] Out: -    Blood pressure (!) 116/92, pulse 76, temperature 98.3 F (36.8 C), temperature source Oral, resp. rate 20, height 5\' 10"  (1.778 m), weight 107.7 kg (237 lb 8 oz), SpO2 96 %.   Physical Exam:  General appearance: alert, appears older than stated age, no distress and moderately obese Eyes: negative findings: lids and lashes normal Neck: no adenopathy, no carotid bruit, no JVD, supple, symmetrical, trachea midline and thyroid not enlarged, symmetric, no tenderness/mass/nodules Neck: JVP - normal, carotids 2+= without bruits Resp: clear to auscultation bilaterally Chest wall: no tenderness Cardio: irregularly irregular rhythm, S1, S2 normal and no S3 or S4 GI: soft, non-tender; bowel sounds normal; no masses,  no organomegaly Extremities: extremities normal, atraumatic, no cyanosis or edema Pulses: 2+ and symmetric Lab Results: BMP  Recent Labs  06/09/16 1627  NA 140  K 3.6  CL 107  CO2 27  GLUCOSE 109*  BUN 21*  CREATININE 0.85  CALCIUM 8.8*  GFRNONAA >60  GFRAA >60   Cardiac Studies:  EKG:06/10/2016: A. Fib with CVR @ 78/min. LAD, non specific T abnormality.   Echo Oval Linsey) 05/13/2016: Mild LVH, LVEF 40-45%.  Left atrium moderately dilated.  Lexiscan sestamibi 01/08/2016 Oval Linsey): Normal sinus rhythm, nondiagnostic stress EKG, fixed apical defect insistent with scar, apical hypokinesis. LVEF 36%. No evidence of ischemia.  Coronary angiogram 05/30/2013: 3.5 x 18 mm Xience DES  to dominant distal left circumflex, 2.5 x 12 mm Xience to OM1.  Assessment/Plan:  1. Symptomatic PAF. Direct current cardioversion 05/27/2016: 120J, 150J & 200J, A. Fib to NSR. However back in A. Fib on f/u. CHA2DS2-VASCScore: Risk Score  2,  Yearly risk of stroke  2.2. Recommendation: Anticoagulation Yes  2. Malaise and fatigue and dyspnea 3. Chronic diastolic heart failure 4. OSA sleep study being persued. 5. Hypertension 6. Hyperlipidemia 7. Moderate Obesity. 8. CAD native vessel without angina.  Rec: For cardioversion tomorrow. Increase Sotalol to 120 mg BID as patient has had break through RVR on tele. No NSVT, QT stable. Discussed with patient and wife regarding A. Fib risk factors.    [name], M.D. 06/11/2016, 7:09 AM [facility] Pager: [phone] Office: [phone] If no answer: [phone]

## 2016-06-11 NOTE — Transfer of Care (Signed)
Immediate Anesthesia Transfer of Care Note  [name]  Procedure(s) Performed: Procedure(s): CARDIOVERSION (N/A)  Patient Location: PACU  Anesthesia Type:MAC and General  Level of Consciousness: awake, alert , oriented and patient cooperative  Airway & Oxygen Therapy: Patient Spontanous Breathing and Patient connected to nasal cannula oxygen  Post-op Assessment: Report given to RN, Post -op Vital signs reviewed and stable and Patient moving all extremities  Post vital signs: Reviewed and stable  Last Vitals:  Vitals:   06/11/16 0624 06/11/16 1052  BP: (!) 116/92 (!) 138/112  Pulse: 76 86  Resp: 20 11  Temp: 36.8 C     Last Pain:  Vitals:   06/11/16 1052  TempSrc: Oral         Complications: No apparent anesthesia complications

## 2016-06-12 ENCOUNTER — Encounter (HOSPITAL_COMMUNITY): Payer: Self-pay | Admitting: Cardiology

## 2016-07-14 ENCOUNTER — Observation Stay (HOSPITAL_COMMUNITY)
Admission: EM | Admit: 2016-07-14 | Discharge: 2016-07-15 | Disposition: A | Discharge status: Home / Self Care | Payer: BLUE CROSS/BLUE SHIELD | Attending: Internal Medicine | Admitting: Internal Medicine

## 2016-07-14 ENCOUNTER — Encounter (HOSPITAL_COMMUNITY): Payer: Self-pay

## 2016-07-14 DIAGNOSIS — R7989 Other specified abnormal findings of blood chemistry: Secondary | ICD-10-CM | POA: Diagnosis present

## 2016-07-14 DIAGNOSIS — I480 Paroxysmal atrial fibrillation: Secondary | ICD-10-CM | POA: Diagnosis not present

## 2016-07-14 DIAGNOSIS — I110 Hypertensive heart disease with heart failure: Secondary | ICD-10-CM | POA: Diagnosis not present

## 2016-07-14 DIAGNOSIS — Z7901 Long term (current) use of anticoagulants: Secondary | ICD-10-CM | POA: Insufficient documentation

## 2016-07-14 DIAGNOSIS — R944 Abnormal results of kidney function studies: Secondary | ICD-10-CM | POA: Insufficient documentation

## 2016-07-14 DIAGNOSIS — E876 Hypokalemia: Secondary | ICD-10-CM | POA: Insufficient documentation

## 2016-07-14 DIAGNOSIS — Z6835 Body mass index (BMI) 35.0-35.9, adult: Secondary | ICD-10-CM | POA: Insufficient documentation

## 2016-07-14 DIAGNOSIS — E785 Hyperlipidemia, unspecified: Secondary | ICD-10-CM | POA: Diagnosis not present

## 2016-07-14 DIAGNOSIS — I252 Old myocardial infarction: Secondary | ICD-10-CM | POA: Diagnosis not present

## 2016-07-14 DIAGNOSIS — I10 Essential (primary) hypertension: Secondary | ICD-10-CM

## 2016-07-14 DIAGNOSIS — R55 Syncope and collapse: Secondary | ICD-10-CM | POA: Diagnosis not present

## 2016-07-14 DIAGNOSIS — E668 Other obesity: Secondary | ICD-10-CM | POA: Insufficient documentation

## 2016-07-14 DIAGNOSIS — Z87891 Personal history of nicotine dependence: Secondary | ICD-10-CM | POA: Diagnosis not present

## 2016-07-14 DIAGNOSIS — I2510 Atherosclerotic heart disease of native coronary artery without angina pectoris: Secondary | ICD-10-CM | POA: Diagnosis not present

## 2016-07-14 DIAGNOSIS — R748 Abnormal levels of other serum enzymes: Secondary | ICD-10-CM

## 2016-07-14 DIAGNOSIS — G4733 Obstructive sleep apnea (adult) (pediatric): Secondary | ICD-10-CM | POA: Diagnosis not present

## 2016-07-14 DIAGNOSIS — I959 Hypotension, unspecified: Secondary | ICD-10-CM | POA: Diagnosis not present

## 2016-07-14 DIAGNOSIS — I2583 Coronary atherosclerosis due to lipid rich plaque: Secondary | ICD-10-CM | POA: Insufficient documentation

## 2016-07-14 DIAGNOSIS — Z955 Presence of coronary angioplasty implant and graft: Secondary | ICD-10-CM | POA: Diagnosis not present

## 2016-07-14 DIAGNOSIS — I5032 Chronic diastolic (congestive) heart failure: Secondary | ICD-10-CM | POA: Diagnosis not present

## 2016-07-14 DIAGNOSIS — Z79899 Other long term (current) drug therapy: Secondary | ICD-10-CM | POA: Insufficient documentation

## 2016-07-14 DIAGNOSIS — I4581 Long QT syndrome: Secondary | ICD-10-CM | POA: Diagnosis not present

## 2016-07-14 DIAGNOSIS — R9431 Abnormal electrocardiogram [ECG] [EKG]: Secondary | ICD-10-CM | POA: Diagnosis present

## 2016-07-14 HISTORY — DX: Unspecified osteoarthritis, unspecified site: M19.90

## 2016-07-14 HISTORY — DX: Headache: R51

## 2016-07-14 HISTORY — DX: Pure hypercholesterolemia, unspecified: E78.00

## 2016-07-14 HISTORY — DX: Headache, unspecified: R51.9

## 2016-07-14 HISTORY — DX: Pneumonia, unspecified organism: J18.9

## 2016-07-14 HISTORY — DX: Other seasonal allergic rhinitis: J30.2

## 2016-07-14 HISTORY — DX: Personal history of other diseases of the digestive system: Z87.19

## 2016-07-14 LAB — BASIC METABOLIC PANEL
Anion gap: 11 (ref 5–15)
BUN: 23 mg/dL — ABNORMAL HIGH (ref 6–20)
CHLORIDE: 102 mmol/L (ref 101–111)
CO2: 24 mmol/L (ref 22–32)
CREATININE: 1.36 mg/dL — AB (ref 0.61–1.24)
Calcium: 9.2 mg/dL (ref 8.9–10.3)
Glucose, Bld: 177 mg/dL — ABNORMAL HIGH (ref 65–99)
POTASSIUM: 3.4 mmol/L — AB (ref 3.5–5.1)
SODIUM: 137 mmol/L (ref 135–145)

## 2016-07-14 LAB — CBC
HCT: 50.1 % (ref 39.0–52.0)
Hemoglobin: 17.6 g/dL — ABNORMAL HIGH (ref 13.0–17.0)
MCH: 29.7 pg (ref 26.0–34.0)
MCHC: 35.1 g/dL (ref 30.0–36.0)
MCV: 84.5 fL (ref 78.0–100.0)
PLATELETS: 248 10*3/uL (ref 150–400)
RBC: 5.93 MIL/uL — AB (ref 4.22–5.81)
RDW: 13.6 % (ref 11.5–15.5)
WBC: 10.1 10*3/uL (ref 4.0–10.5)

## 2016-07-14 LAB — URINALYSIS, ROUTINE W REFLEX MICROSCOPIC
Bilirubin Urine: NEGATIVE
GLUCOSE, UA: NEGATIVE mg/dL
Hgb urine dipstick: NEGATIVE
KETONES UR: NEGATIVE mg/dL
LEUKOCYTES UA: NEGATIVE
NITRITE: NEGATIVE
PROTEIN: NEGATIVE mg/dL
Specific Gravity, Urine: 1.01 (ref 1.005–1.030)
pH: 6 (ref 5.0–8.0)

## 2016-07-14 LAB — MAGNESIUM: MAGNESIUM: 2.1 mg/dL (ref 1.7–2.4)

## 2016-07-14 LAB — TROPONIN I

## 2016-07-14 MED ORDER — EZETIMIBE 10 MG PO TABS
10.0000 mg | ORAL_TABLET | Freq: Every day | ORAL | Status: DC
  Administered 2016-07-15: 10 mg via ORAL
  Filled 2016-07-14: qty 1

## 2016-07-14 MED ORDER — ACETAMINOPHEN 325 MG PO TABS
650.0000 mg | ORAL_TABLET | Freq: Four times a day (QID) | ORAL | Status: DC | PRN
Start: 2016-07-14 — End: 2016-07-15
  Administered 2016-07-14: 650 mg via ORAL
  Filled 2016-07-14: qty 2

## 2016-07-14 MED ORDER — ZOLPIDEM TARTRATE 5 MG PO TABS
5.0000 mg | ORAL_TABLET | Freq: Every evening | ORAL | Status: DC | PRN
  Administered 2016-07-15: 5 mg via ORAL
  Filled 2016-07-14: qty 1

## 2016-07-14 MED ORDER — ACETAMINOPHEN 650 MG RE SUPP: 650.0000 mg | Freq: Four times a day (QID) | RECTAL | Status: DC | PRN

## 2016-07-14 MED ORDER — ONDANSETRON HCL 4 MG/2ML IJ SOLN
4.0000 mg | Freq: Once | INTRAMUSCULAR | Status: AC
  Administered 2016-07-14: 4 mg via INTRAVENOUS
  Filled 2016-07-14: qty 2

## 2016-07-14 MED ORDER — MAGNESIUM 200 MG PO TABS: ORAL_TABLET | Freq: Every day | ORAL | Status: DC

## 2016-07-14 MED ORDER — ROSUVASTATIN CALCIUM 10 MG PO TABS
20.0000 mg | ORAL_TABLET | Freq: Every day | ORAL | Status: DC
  Administered 2016-07-15: 20 mg via ORAL
  Filled 2016-07-14: qty 2

## 2016-07-14 MED ORDER — SOTALOL HCL 80 MG PO TABS
80.0000 mg | ORAL_TABLET | Freq: Two times a day (BID) | ORAL | Status: DC
  Administered 2016-07-14: 80 mg via ORAL
  Filled 2016-07-14: qty 1

## 2016-07-14 MED ORDER — SODIUM CHLORIDE 0.9 % IV BOLUS (SEPSIS)
1000.0000 mL | Freq: Once | INTRAVENOUS | Status: AC
  Administered 2016-07-14: 1000 mL via INTRAVENOUS

## 2016-07-14 MED ORDER — ACETAMINOPHEN 325 MG PO TABS
650.0000 mg | ORAL_TABLET | Freq: Once | ORAL | Status: AC
  Administered 2016-07-14: 650 mg via ORAL
  Filled 2016-07-14: qty 2

## 2016-07-14 MED ORDER — APIXABAN 5 MG PO TABS
5.0000 mg | ORAL_TABLET | Freq: Two times a day (BID) | ORAL | Status: DC
  Administered 2016-07-14 – 2016-07-15 (×2): 5 mg via ORAL
  Filled 2016-07-14 (×2): qty 1

## 2016-07-14 MED ORDER — POTASSIUM CHLORIDE CRYS ER 20 MEQ PO TBCR
40.0000 meq | EXTENDED_RELEASE_TABLET | Freq: Once | ORAL | Status: AC
  Administered 2016-07-14: 40 meq via ORAL
  Filled 2016-07-14: qty 2

## 2016-07-14 NOTE — ED Triage Notes (Signed)
Pt. Coming from work via [place] for near syncope. Pt. Sitting in break room when he felt hot and nauseous. Pt. Walked and had to sit down because he felt like he was going to pass out. Pt. Found to be cool and clammy by EMS. Pt. Hx of Afib and sts that he had these symptoms when he had afib before. Pt. Had cardioversion 06/19/16 and once before that. Pt. Initial pressure 90/60, but increased to 126/74 after 245mL normal saline.

## 2016-07-14 NOTE — ED Provider Notes (Signed)
[facility] DEPT Provider Note   CSN: [id number] Arrival date & time: 07/14/16  1622     History   Chief Complaint Chief Complaint  Patient presents with  . Near Syncope    HPI Danny Kelley is a 50 y.o. male.   Illness  This is a new problem. The current episode started 1 to 2 hours ago. The problem occurs constantly. The problem has been resolved. Associated symptoms include headaches. Pertinent negatives include no chest pain and no shortness of breath. Nothing aggravates the symptoms. Nothing relieves the symptoms. He has tried nothing for the symptoms.    Past Medical History:  Diagnosis Date  . Arthritis    "a little in my right knee" (07/14/2016)  . Complication of anesthesia    Pt reports "they have a hard time waking me up"  . Coronary artery disease   . GERD (gastroesophageal reflux disease)    hx  . Headache    "with every heart issue that I have" (07/14/2016)  . High cholesterol   . History of hiatal hernia 1990s   "fixed it w/scope down my throat"  . Hypertension   . Myocardial infarction ([place]) 05/2013   stents: left PDA, 1st OM  . Paroxysmal atrial fibrillation (HCC)   . Pneumonia ~ 1992  . Seasonal allergies    "I take Allegra prn" (07/14/2016)  . Sleep apnea    "test done years ago; never treated" (07/14/2016)    Patient Active Problem List   Diagnosis Date Noted  . Near syncope 07/14/2016  . Coronary artery disease due to lipid rich plaque 07/14/2016  . Essential hypertension 07/14/2016  . Chronic diastolic CHF (congestive heart failure) ([place]) 07/14/2016  . Elevated serum creatinine 07/14/2016  . Hypotension, unspecified 07/14/2016  . Paroxysmal atrial fibrillation ([facility]) 06/09/2016    Past Surgical History:  Procedure Laterality Date  . APPENDECTOMY  1984  . CARDIOVERSION N/A 05/27/2016   Procedure: CARDIOVERSION;  Surgeon: [name], MD;  Location: [facility] ENDOSCOPY;  Service: Cardiovascular;  Laterality: N/A;  . CARDIOVERSION N/A 06/11/2016   Procedure: CARDIOVERSION;  Surgeon: [name], MD;  Location: [facility];  Service: Cardiovascular;  Laterality: N/A;  . CORONARY ANGIOPLASTY WITH STENT PLACEMENT  05/30/2013   "2 stents put in at [facility]" & /stent card  . ESOPHAGOGASTRODUODENOSCOPY (EGD) WITH ESOPHAGEAL DILATION  1990s X 2  . KNEE ARTHROSCOPY Right 1986; ~ 1995 X 2  . LAPAROSCOPIC CHOLECYSTECTOMY  2015  . REPAIR OF ESOPHAGUS  1998   perforation of the distal esophagus from bad heartburn       Home Medications    Prior to Admission medications   Medication Sig Start Date End Date Taking? Authorizing Provider  acetaminophen (TYLENOL) 325 MG tablet Take 325-650 mg by mouth every 6 (six) hours as needed for mild pain or headache.   Yes Historical Provider, MD  apixaban (ELIQUIS) 5 MG TABS tablet Take 5 mg by mouth 2 (two) times daily.   Yes Historical Provider, MD  b complex vitamins tablet Take 1 tablet by mouth daily.   Yes Historical Provider, MD  ezetimibe (ZETIA) 10 MG tablet Take 10 mg by mouth daily.   Yes Historical Provider, MD  Flaxseed, Linseed, (FLAX SEED OIL PO) Take 2 capsules by mouth daily.   Yes Historical Provider, MD  MAGNESIUM PO Take 1 tablet by mouth daily.   Yes Historical Provider, MD  Multiple Vitamins-Minerals (ONE-A-DAY MENS 50+ ADVANTAGE) TABS Take 1 tablet by mouth daily with breakfast.  Yes Historical Provider, MD  rosuvastatin (CRESTOR) 20 MG tablet Take 20 mg by mouth daily.   Yes Historical Provider, MD  sotalol (BETAPACE) 120 MG tablet Take 1 tablet (120 mg total) by mouth every 12 (twelve) hours. 06/11/16  Yes [name], MD  torsemide (DEMADEX) 20 MG tablet Take 1 tablet (20 mg total) by mouth daily as needed. Take only if leg swelling or shortness of breath 06/11/16  Yes [name], MD  valsartan-hydrochlorothiazide (DIOVAN HCT) 160-12.5 MG tablet Take 1 tablet by mouth daily. 06/11/16  Yes [name], MD  zolpidem (AMBIEN) 5 MG tablet Take 5 mg by mouth at bedtime.   Yes Historical  Provider, MD    Family History Family History  Problem Relation Age of Onset  . Heart disease Mother 22    CABG age 51  . Breast cancer Mother   . Tongue cancer Mother   . Diabetes Mother   . Heart attack Father 59  . Diabetes Father   . Lung cancer Father   . Congestive Heart Failure Father   . Lung cancer Paternal Uncle     Social History Social History  Substance Use Topics  . Smoking status: Former Smoker    Packs/day: 1.00    Years: 25.00    Types: Cigarettes, E-cigarettes    Quit date: 01/07/2015  . Smokeless tobacco: Never Used  . Alcohol use No     Allergies   Erythromycin   Review of Systems Review of Systems  Constitutional: Positive for appetite change, diaphoresis and fatigue.  Respiratory: Negative for shortness of breath.   Cardiovascular: Negative for chest pain.  Gastrointestinal: Positive for nausea. Negative for vomiting.  Skin: Positive for pallor.  Neurological: Positive for headaches.  All other systems reviewed and are negative.    Physical Exam Updated Vital Signs BP 119/74 (BP Location: Left Arm)   Pulse 60   Temp 98.1 F (36.7 C) (Oral)   Resp 16   Ht 5\' 9"  (1.753 m)   Wt 240 lb 1.6 oz (108.9 kg)   SpO2 99%   BMI 35.46 kg/m   Physical Exam  Constitutional: He appears well-developed and well-nourished.  HENT:  Head: Normocephalic and atraumatic.  Eyes: Conjunctivae are normal.  Neck: Neck supple.  Cardiovascular: Normal rate and regular rhythm.  Exam reveals no gallop and no friction rub.   No murmur heard. hypotensive  Pulmonary/Chest: Effort normal and breath sounds normal. No respiratory distress.  Abdominal: Soft. There is no tenderness.  Musculoskeletal: Normal range of motion. He exhibits no edema or deformity.  Neurological: He is alert.  Skin: Skin is warm and dry.  Psychiatric: He has a normal mood and affect.  Nursing note and vitals reviewed.    ED Treatments / Results  Labs (all labs ordered are listed,  but only abnormal results are displayed) Labs Reviewed  BASIC METABOLIC PANEL - Abnormal; Notable for the following:       Result Value   Potassium 3.4 (*)    Glucose, Bld 177 (*)    BUN 23 (*)    Creatinine, Ser 1.36 (*)    All other components within normal limits  CBC - Abnormal; Notable for the following:    RBC 5.93 (*)    Hemoglobin 17.6 (*)    All other components within normal limits  URINALYSIS, ROUTINE W REFLEX MICROSCOPIC (NOT AT [facility])  MAGNESIUM  TROPONIN I  TROPONIN I  BASIC METABOLIC PANEL  CBC  SODIUM, URINE, RANDOM  CREATININE, URINE,  RANDOM    EKG  EKG Interpretation  Date/Time:  Monday July 14 2016 16:29:22 EDT Ventricular Rate:  70 PR Interval:    QRS Duration: 113 QT Interval:  473 QTC Calculation: 511 R Axis:   41 Text Interpretation:  Sinus rhythm Ventricular premature complex Borderline intraventricular conduction delay Repol abnrm suggests ischemia, anterolateral Prolonged QT interval Confirmed by [facility] MD, Corene Cornea [phone]) on 07/14/2016 4:53:25 PM       Radiology No results found.  Procedures Procedures (including critical care time)  Medications Ordered in ED Medications  apixaban (ELIQUIS) tablet 5 mg (5 mg Oral Given 07/14/16 2206)  sotalol (BETAPACE) tablet 80 mg (80 mg Oral Given 07/14/16 2206)  ezetimibe (ZETIA) tablet 10 mg (not administered)  rosuvastatin (CRESTOR) tablet 20 mg (not administered)  zolpidem (AMBIEN) tablet 5 mg (not administered)  acetaminophen (TYLENOL) tablet 650 mg (650 mg Oral Given 07/14/16 2214)    Or  acetaminophen (TYLENOL) suppository 650 mg ( Rectal See Alternative 07/14/16 2214)  sodium chloride 0.9 % bolus 1,000 mL (0 mLs Intravenous Stopped 07/14/16 1852)  acetaminophen (TYLENOL) tablet 650 mg (650 mg Oral Given 07/14/16 1833)  sodium chloride 0.9 % bolus 1,000 mL (0 mLs Intravenous Stopped 07/14/16 2014)  ondansetron (ZOFRAN) injection 4 mg (4 mg Intravenous Given 07/14/16 1852)  potassium chloride SA  (K-DUR,KLOR-CON) CR tablet 40 mEq (40 mEq Oral Given 07/14/16 2206)     Initial Impression / Assessment and Plan / ED Course  I have reviewed the triage vital signs and the nursing notes.  Pertinent labs & imaging results that were available during my care of the patient were reviewed by me and considered in my medical decision making (see chart for details).  Near syncopal episode and some intermittent hypotenstion. Possibly over beta blockade. Will speak with Dr. Einar Gip regarding admission.   Clinical Course  Comment By Time  Dr. Einar Gip consulted and agrees with medicine admission for cardiac monitoring 2/2 prolonged QTc. Also recommends decreasing sotalol to 80 mg and he will see patient in morning.  Merrily Pew, MD 09/11 [phone]    Final Clinical Impressions(s) / ED Diagnoses   Final diagnoses:  Near syncope    New Prescriptions Current Discharge Medication List       Merrily Pew, MD 07/15/16 [phone]

## 2016-07-14 NOTE — H&P (Signed)
History and Physical  Patient Name: Danny Kelley     [id number]    DOB: Jul 13, 1966    DOA: 07/14/2016 PCP: No PCP Per Patient   Patient coming from: Home via EMS  Chief Complaint: Lucky Rathke  HPI: Danny Kelley is a 50 y.o. male with a past medical history significant for CAD s/p PCI x2 in 2014, pAF on apixaban and sotalol, and HTN who presents with pre-syncopal symptoms.  The patient experienced some vague malaise and headache over the weekend, but was otherwise in his usual health until this morning when he was walking into work, and had very sudden onset diaphoresis, dizziness (so that he had to hold onto the wall), and nausea. He was so weak and dizzy that he couldn't walk, so EMS were called, and he reports that a first responder at the scene told him his heart rate was "fast", and EMS also found his heart rate fast and irregular and his blood pressure low, and so brought him to the ER.   There was no chest pain, and he felt no palpitations or shortness of breath. Over the weekend he had had no fever, chills, new cough, dysuria, hematuria, or other urinary symptoms.  ED course: -Afebrile, heart rate 70s, respirations 20, blood pressure 90/60, pulse oximetry normal -Na 137, K 3.4, Cr 1.36 (baseline 0.8), WBC 10.1 K, Hgb 17.6, magnesium 2.1, troponin negative -Urinalysis without pyuria, hematuria, bacteriuria -ECG showed sinus rhythm with a PVC and new inverted T waves  Of note, the patient has had paroxysmal Afib for some time, on metoprolol, followed in [place].  He was very symptomatic, however, and so recently was evaluated here in [place] and underwent cardioversion with Dr. Einar Gip last July.  This was successful, but he reverted after 1 week and had repeat cardioversion and started sotalol, which he has been taking regularly, for 1 month without problems.    He also notes some odd, left-sided chest wall swelling (notes it in his left breast and left abdomen, shows a picture on  his phone, of very subtle *possible* chest wall swelling, never associated with leg swelling).  He was taking Demadex for this PRN, took last dose last Friday before this weekend.       ROS: Review of Systems  Constitutional: Positive for chills and diaphoresis. Negative for fever and malaise/fatigue.  Respiratory: Negative for cough, sputum production, shortness of breath and wheezing.   Cardiovascular: Negative for chest pain, palpitations, orthopnea, claudication, leg swelling and PND.  Gastrointestinal: Positive for nausea and vomiting. Negative for abdominal pain and diarrhea.  Genitourinary: Positive for flank pain (after taking Demadex only). Negative for dysuria, frequency, hematuria and urgency.  Musculoskeletal: Negative for myalgias.  Neurological: Positive for dizziness.  All other systems reviewed and are negative.         Past Medical History:  Diagnosis Date  . Complication of anesthesia    Pt reports "they have a hard time waking me up"  . Coronary artery disease   . GERD (gastroesophageal reflux disease)   . Hypertension   . Myocardial infarction [facility])    June 2014; stents: left PDA, 1st OM  . Paroxysmal atrial fibrillation (HCC)   . Shortness of breath dyspnea   . Sleep apnea     Past Surgical History:  Procedure Laterality Date  . APPENDECTOMY  1984  . CARDIAC CATHETERIZATION    . CARDIOVERSION N/A 05/27/2016   Procedure: CARDIOVERSION;  Surgeon: [name], MD;  Location: [facility];  Service:  Cardiovascular;  Laterality: N/A;  . CARDIOVERSION N/A 06/11/2016   Procedure: CARDIOVERSION;  Surgeon: [name], MD;  Location: [facility];  Service: Cardiovascular;  Laterality: N/A;  . CHOLECYSTECTOMY  2015  . CORONARY STENT PLACEMENT  04/19/2013  . ESOPHAGOGASTRODUODENOSCOPY (EGD) WITH ESOPHAGEAL DILATION    . KNEE ARTHROSCOPY  1986  . REPAIR OF ESOPHAGUS  1998   perforation of the distal esophagus    Social History: Patient lives with his wife and  son.  The patient walks unassisted.  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  He is a former smoker.  Lives in [place], [place].  No alcohol.  Allergies  Allergen Reactions  . Erythromycin Other (See Comments)    Hallucinations     Family history: family history includes Breast cancer in his mother; Congestive Heart Failure in his father; Diabetes in his father and mother; Heart attack (age of onset: 53) in his father; Heart disease (age of onset: 68) in his mother; Lung cancer in his father and paternal uncle; Tongue cancer in his mother.  Prior to Admission medications   Medication Sig Start Date End Date Taking? Authorizing Provider  acetaminophen (TYLENOL) 325 MG tablet Take 325-650 mg by mouth every 6 (six) hours as needed for mild pain or headache.   Yes Historical Provider, MD  apixaban (ELIQUIS) 5 MG TABS tablet Take 5 mg by mouth 2 (two) times daily.   Yes Historical Provider, MD  b complex vitamins tablet Take 1 tablet by mouth daily.   Yes Historical Provider, MD  ezetimibe (ZETIA) 10 MG tablet Take 10 mg by mouth daily.   Yes Historical Provider, MD  Flaxseed, Linseed, (FLAX SEED OIL PO) Take 2 capsules by mouth daily.   Yes Historical Provider, MD  MAGNESIUM PO Take 1 tablet by mouth daily.   Yes Historical Provider, MD  Multiple Vitamins-Minerals (ONE-A-DAY MENS 50+ ADVANTAGE) TABS Take 1 tablet by mouth daily with breakfast.   Yes Historical Provider, MD  rosuvastatin (CRESTOR) 20 MG tablet Take 20 mg by mouth daily.   Yes Historical Provider, MD  sotalol (BETAPACE) 120 MG tablet Take 1 tablet (120 mg total) by mouth every 12 (twelve) hours. 06/11/16  Yes [name], MD  torsemide (DEMADEX) 20 MG tablet Take 1 tablet (20 mg total) by mouth daily as needed. Take only if leg swelling or shortness of breath 06/11/16  Yes [name], MD  valsartan-hydrochlorothiazide (DIOVAN HCT) 160-12.5 MG tablet Take 1 tablet by mouth daily. 06/11/16  Yes [name], MD  zolpidem (AMBIEN) 5 MG  tablet Take 5 mg by mouth at bedtime.   Yes Historical Provider, MD       Physical Exam: BP 128/70   Pulse 58   Temp 97.8 F (36.6 C) (Oral)   Resp 15   Ht 5\' 9"  (1.753 m)   Wt 108 kg (238 lb)   SpO2 97%   BMI 35.15 kg/m  General appearance: Well-developed, adult male, alert and in no acte distress.  Has some chills when I first enter, these resolve.   Eyes: Anicteric, conjunctiva pink, lids and lashes normal. PERRL.    ENT: No nasal deformity, discharge, epistaxis.  Hearing normal. OP moist without lesions.   Neck: No neck masses.  Trachea midline.  No thyromegaly/tenderness. Lymph: No cervical or supraclavicular lymphadenopathy. Skin: Warm and dry.  No jaundice.  No suspicious rashes or lesions. Cardiac: RRR, nl S1-S2, no murmurs appreciated.  Capillary refill is brisk.  JVP not visible.  No  LE edema.  Radial and DP pulses 2+ and symmetric. Respiratory: Normal respiratory rate and rhythm.  CTAB without rales or wheezes. Abdomen: Abdomen soft.  No TTP. No ascites, distension, hepatosplenomegaly.   MSK: No deformities or effusions.  No cyanosis or clubbing. Neuro: Cranial nerves normal.  Sensation intact to light touch. Speech is fluent.  Muscle strength 5/5 in upper and lower extremities.    Psych: Sensorium intact and responding to questions, attention normal.  Behavior appropriate.  Affect normal.  Judgment and insight appear normal.     Labs on Admission:  I have personally reviewed following labs and imaging studies: CBC:  Recent Labs Lab 07/14/16 1630  WBC 10.1  HGB 17.6*  HCT 50.1  MCV 84.5  PLT [id number]   Basic Metabolic Panel:  Recent Labs Lab 07/14/16 1630  NA 137  K 3.4*  CL 102  CO2 24  GLUCOSE 177*  BUN 23*  CREATININE 1.36*  CALCIUM 9.2  MG 2.1   GFR: Estimated Creatinine Clearance: 81.3 mL/min (by C-G formula based on SCr of 1.36 mg/dL).  Cardiac Enzymes:  Recent Labs Lab 07/14/16 1759  TROPONINI <0.03        EKG: Independently  reviewed. Rate 70, QTc 511, prolonged.  New T wave inversions, diffusely.  Single PVC, otherwise sinus rhtythm.    Assessment/Plan 1. Near syncope and hypotension:  Suspect the patient had an episode of arrhythmia that caused his symptoms. It is also possible he was hypovolemic (although only took a single dose Demadex last Friday).  He improved rapidly on arrival to the ER, at which time he was back in sinus rhythm and was given fluids. No evidence of infection.  Cardiogenic shock doubted.  -Monitor on telemetry for Afib or ventricular arrhythmia -Obtain echocardiogram -Obtain cultures for temp > 101F -Hold ARB and HCTZ -Decrease sotalol    2. Elevated creatinine:  Suspect this is prerenal contraction after Demadex. -Check urine electrolytes -Repeat BMP tomorrow after fluids -Strict ins and outs -Hold ARB  3. Hypokalemia:  Magnesium normal, mild hypokalemia. Goal greater than 4. -Oral potassium now -Repeat BMP tomorrow  4. pAF:  CHADS2-VASc 3. On apixaban and sotalol. -Decreased sotalol -Continue apixaban -Monitor on telemetry  5. Chronic diastolic CHF:  Previous outside Echocardiogram with reported EF 40-45%.  None available within our system.  Appears euvolemic. -Hold PRN torsemide -Obtain echocardiogram  6. CAD and HTN:  -Hold ARB and HCTZ given hypotension -Continue Crestor and Zetia  7. Prolonged QTc:  -Decrease sotalol to 80 mg -Avoid additional QT prolonging meds -Repeat ECG tomorrow        DVT prophylaxis: On apixaban, not needed  Code Status: FULL  Family Communication: Wife and son at bedside.  An opportunity for questions was given, all questions were answered.  Disposition Plan: Anticipate observation overnight, fluids, monitor on telemetry.  Repeat ECG and obtain echocardiogram tomorrow.  Adjust sotalol. Eval by Cardiology tomorrow, and if BP stable and no new abnormal labs, home tomorrow likely. Consults called: Dr. Einar Gip Admission status:  OBS, tele At the point of initial evaluation, it is my clinical opinion that admission for OBSERVATION is reasonable and necessary because the patient's presenting complaints in the context of their chronic conditions represent sufficient risk of deterioration or significant morbidity to constitute reasonable grounds for close observation in the hospital setting, but that the patient may be medically stable for discharge from the hospital within 24 to 48 hours.    Medical decision making: Patient seen at 7:30 PM on  07/14/2016.  The patient was discussed with Dr. Dayna Barker and Dr. Einar Gip.  What exists of the patient's chart was reviewed in depth and summarized above.  Clinical condition: stable, BP improved with fluids, no further symptoms.        Edwin Dada [facility] Pager [phone]

## 2016-07-14 NOTE — ED Notes (Signed)
Admitting at bedside 

## 2016-07-14 NOTE — ED Notes (Signed)
EDP at bedside  

## 2016-07-14 NOTE — ED Notes (Signed)
Placed patient into a gown and on the monitor 

## 2016-07-15 ENCOUNTER — Other Ambulatory Visit (HOSPITAL_COMMUNITY): Payer: Self-pay

## 2016-07-15 DIAGNOSIS — R55 Syncope and collapse: Secondary | ICD-10-CM | POA: Diagnosis not present

## 2016-07-15 DIAGNOSIS — I110 Hypertensive heart disease with heart failure: Secondary | ICD-10-CM | POA: Diagnosis not present

## 2016-07-15 DIAGNOSIS — R9431 Abnormal electrocardiogram [ECG] [EKG]: Secondary | ICD-10-CM | POA: Diagnosis present

## 2016-07-15 DIAGNOSIS — I2510 Atherosclerotic heart disease of native coronary artery without angina pectoris: Secondary | ICD-10-CM | POA: Diagnosis not present

## 2016-07-15 DIAGNOSIS — I4581 Long QT syndrome: Secondary | ICD-10-CM

## 2016-07-15 DIAGNOSIS — I2583 Coronary atherosclerosis due to lipid rich plaque: Secondary | ICD-10-CM | POA: Diagnosis not present

## 2016-07-15 HISTORY — DX: Abnormal electrocardiogram (ECG) (EKG): R94.31

## 2016-07-15 LAB — BASIC METABOLIC PANEL
ANION GAP: 6 (ref 5–15)
BUN: 23 mg/dL — ABNORMAL HIGH (ref 6–20)
CALCIUM: 8.6 mg/dL — AB (ref 8.9–10.3)
CO2: 27 mmol/L (ref 22–32)
CREATININE: 1.13 mg/dL (ref 0.61–1.24)
Chloride: 106 mmol/L (ref 101–111)
Glucose, Bld: 124 mg/dL — ABNORMAL HIGH (ref 65–99)
Potassium: 3.7 mmol/L (ref 3.5–5.1)
Sodium: 139 mmol/L (ref 135–145)

## 2016-07-15 LAB — CBC
HCT: 47.6 % (ref 39.0–52.0)
HEMOGLOBIN: 16.4 g/dL (ref 13.0–17.0)
MCH: 29.1 pg (ref 26.0–34.0)
MCHC: 34.5 g/dL (ref 30.0–36.0)
MCV: 84.5 fL (ref 78.0–100.0)
PLATELETS: 229 10*3/uL (ref 150–400)
RBC: 5.63 MIL/uL (ref 4.22–5.81)
RDW: 13.9 % (ref 11.5–15.5)
WBC: 11.5 10*3/uL — ABNORMAL HIGH (ref 4.0–10.5)

## 2016-07-15 LAB — TROPONIN I

## 2016-07-15 NOTE — Consult Note (Signed)
CARDIOLOGY CONSULT NOTE  Patient ID: Danny Kelley MRN: [id number] DOB/AGE: 03/27/1966 50 y.o.  Admit date: 07/14/2016 Referring Physician  [place] Primary Physician:  No PCP Per Patient Reason for Consultation  Abnormal EKG  HPI: Danny Kelley  is a 50 y.o. male  With history of CAD status post PCI and stent placement to left Cx and OM1 in 2014, hypertension, hyperlipidemia, and recent history of tobacco use, quit in March 2017. Family history significant for premature CAD in both parents, mother had CABG at age 73 and father had MI at age 22. He has obstructive sleep apnea but has not been using CPAP, Repeat sleep study positive for severe sleep apnea, he is awaiting equipment supply.  He has history of paroxysmal atrial fibrillation, hypertension, hyperlipidemia. Patient underwent direct current cardioversion on 05/27/2016, felt well for almost a week until 2 days ago suddenly felt marked generalized weakness and thought that he was back in atrial fibrillation on his OV on 06/05/2016. Underwent cardioversion on 06/11/2016 on Sotalol and has maintained sinus rhythm.  He been doing well until yesterday while at work, suddenly felt dizzy, felt presyncopal, nauseated and diaphoretic. EMS was called, and was told to have elevated heart rate, low blood pressure, hence was recommended to come to the emergency room. In the emergency room she was found to be in sinus rhythm, serum creatinine mildly increased, he was admitted for hydration and further evaluation.  Patient states that the whole episode lasted for about 15 minutes and since then he has felt well. He states that since being in sinus rhythm, his energy level is improved, dyspnea has improved and no further fatigue. Is presently tolerating Eliquis without any bleeding diathesis. This morning he remains asymptomatic.  Past Medical History:  Diagnosis Date  . Arthritis    "a little in my right knee" (07/14/2016)  . Complication of anesthesia    Pt reports "they have a hard time waking me up"  . Coronary artery disease   . GERD (gastroesophageal reflux disease)    hx  . Headache    "with every heart issue that I have" (07/14/2016)  . High cholesterol   . History of hiatal hernia 1990s   "fixed it w/scope down my throat"  . Hypertension   . Myocardial infarction ([place]) 05/2013   stents: left PDA, 1st OM  . Paroxysmal atrial fibrillation (HCC)   . Pneumonia ~ 1992  . Seasonal allergies    "I take Allegra prn" (07/14/2016)  . Sleep apnea    "test done years ago; never treated" (07/14/2016)     Past Surgical History:  Procedure Laterality Date  . APPENDECTOMY  1984  . CARDIOVERSION N/A 05/27/2016   Procedure: CARDIOVERSION;  Surgeon: [name], MD;  Location: [facility] ENDOSCOPY;  Service: Cardiovascular;  Laterality: N/A;  . CARDIOVERSION N/A 06/11/2016   Procedure: CARDIOVERSION;  Surgeon: [name], MD;  Location: [place];  Service: Cardiovascular;  Laterality: N/A;  . CORONARY ANGIOPLASTY WITH STENT PLACEMENT  05/30/2013   "2 stents put in at [facility]" & /stent card  . ESOPHAGOGASTRODUODENOSCOPY (EGD) WITH ESOPHAGEAL DILATION  1990s X 2  . KNEE ARTHROSCOPY Right 1986; ~ 1995 X 2  . LAPAROSCOPIC CHOLECYSTECTOMY  2015  . REPAIR OF ESOPHAGUS  1998   perforation of the distal esophagus from bad heartburn     Family History  Problem Relation Age of Onset  . Heart disease Mother 66    CABG age 15  . Breast cancer Mother   .  Tongue cancer Mother   . Diabetes Mother   . Heart attack Father 36  . Diabetes Father   . Lung cancer Father   . Congestive Heart Failure Father   . Lung cancer Paternal Uncle      Social History: Married, employed, does not use tobacco products, does not drink alcohol. No history of illicit drug use.    Prescriptions Prior to Admission  Medication Sig Dispense Refill Last Dose  . acetaminophen (TYLENOL) 325 MG tablet Take 325-650 mg by mouth every 6 (six) hours as needed for mild pain or  headache.   07/14/2016 at am  . apixaban (ELIQUIS) 5 MG TABS tablet Take 5 mg by mouth 2 (two) times daily.   07/14/2016 at am  . b complex vitamins tablet Take 1 tablet by mouth daily.   07/14/2016 at am  . ezetimibe (ZETIA) 10 MG tablet Take 10 mg by mouth daily.   07/14/2016 at am  . Flaxseed, Linseed, (FLAX SEED OIL PO) Take 2 capsules by mouth daily.   07/14/2016 at am  . MAGNESIUM PO Take 1 tablet by mouth daily.   07/14/2016 at am  . Multiple Vitamins-Minerals (ONE-A-DAY MENS 50+ ADVANTAGE) TABS Take 1 tablet by mouth daily with breakfast.   07/14/2016 at am  . rosuvastatin (CRESTOR) 20 MG tablet Take 20 mg by mouth daily.   07/14/2016 at am  . sotalol (BETAPACE) 120 MG tablet Take 1 tablet (120 mg total) by mouth every 12 (twelve) hours. 60 tablet 2 07/14/2016 at am  . torsemide (DEMADEX) 20 MG tablet Take 1 tablet (20 mg total) by mouth daily as needed. Take only if leg swelling or shortness of breath   07/11/2016 at am  . valsartan-hydrochlorothiazide (DIOVAN HCT) 160-12.5 MG tablet Take 1 tablet by mouth daily. 30 tablet 1 07/14/2016 at am  . zolpidem (AMBIEN) 5 MG tablet Take 5 mg by mouth at bedtime.   07/13/2016 at pm     ROS: General: no fevers/chills/night sweats Eyes: no blurry vision, diplopia, or amaurosis ENT: no sore throat or hearing loss Resp: no cough, wheezing, or hemoptysis CV: no edema or palpitations GI: no abdominal pain, nausea, vomiting, diarrhea, or constipation GU: no dysuria, frequency, or hematuria Skin: no rash Neuro: no headache, numbness, tingling, or weakness of extremities Musculoskeletal: no joint pain or swelling Heme: no bleeding, DVT, or easy bruising Endo: no polydipsia or polyuria    Physical Exam: Blood pressure 109/66, pulse (!) 55, temperature 98.1 F (36.7 C), temperature source Oral, resp. rate 16, height 5\' 9"  (1.753 m), weight 109.1 kg (240 lb 8 oz), SpO2 96 %.  Body mass index is 35.52 kg/m.  General appearance: alert, cooperative, appears  older than stated age, no distress and moderately obese Lungs: clear to auscultation bilaterally Heart: regular rate and rhythm, S1, S2 normal, no murmur, click, rub or gallop Abdomen: soft, non-tender; bowel sounds normal; no masses,  no organomegaly Extremities: extremities normal, atraumatic, no cyanosis or edema Pulses: 2+ and symmetric Neurologic: Grossly normal  Labs:   Lab Results  Component Value Date   WBC 11.5 (H) 07/15/2016   HGB 16.4 07/15/2016   HCT 47.6 07/15/2016   MCV 84.5 07/15/2016   PLT 229 07/15/2016    Recent Labs Lab 07/15/16 0012  NA 139  K 3.7  CL 106  CO2 27  BUN 23*  CREATININE 1.13  CALCIUM 8.6*  GLUCOSE 124*   Recent Labs  07/14/16 1759 07/15/16 0012  TROPONINI <0.03 <0.03  EKG 07/14/2016: Normal sinus rhythm at a rate of 70 bpm, normal axis, PVC, diffuse nonspecific T-wave inversion in anterolateral leads and inferior leads with prolonged QT at 470 ms. Corrected QT 511 ms.  EKG 07/15/2016: Normal sinus rhythm/sinus pericardia tear 52 beats a minute, normal axis, nonspecific inferior and lateral T-wave inversion, prolonged QT at 558 ms, corrected QT 518 ms.  Echo Oval [place]) 05/13/2016: Mild LVH, LVEF 40-45%.  Left atrium moderately dilated.  Lexiscan sestamibi 01/08/2016: Normal sinus rhythm, nondiagnostic stress EKG, fixed apical defect insistent with scar, apical hypokinesis. LVEF 36%. No evidence of ischemia.  Coronary angiogram 05/30/2013: 3.5 x 18 mm Xience DES to dominant distal left circumflex, 2.5 x 12 mm Xience to OM1.  Echo: Pending  Scheduled Meds: . apixaban  5 mg Oral BID  . ezetimibe  10 mg Oral Daily  . rosuvastatin  20 mg Oral Daily   Continuous Infusions:  PRN Meds:.acetaminophen **OR** acetaminophen, zolpidem  ASSESSMENT AND PLAN:  1. Near syncope, this could be probably related to recurrence of atrial fibrillation or could be nonspecific, no EKG tracings obtained by the EMS. Although mental tachycardia with  prolonged QT is possible, clinical picture does not appear to fit into this. 2. Abnormal EKG, prolonged QT secondary to sotalol. 3. Atherosclerosis of native coronary artery of native heart without angina pectoris  H/O 05/30/2013: 3.5 x 18 mm Xience DES to dominant distal left circumflex, 2.5 x 12 mm Xience to OM1. 4. Chronic diastolic heart failure 5. Moderate obesity with OSA (obstructive sleep apnea) awaiting equipment supply  6. Hypertension 7. Hyperlipidemia  Recommendation: Discontinue sotalol, repeat EKG at 4 PM today. As long as QT does not continue to prolonged, he can be discharged home today and I will see back in the office in 3-4 days with repeat EKG for normalization of QT interval. I will consider Multaq as an antiarrhythmic therapy. Certainly he has felt well with maintaining sinus rhythm, weight loss and treatment of sleep apnea importance discussed with the patient. His ACE inhibitor and HCTZ can be restarted.  [name], MD 07/15/2016, 9:24 AM [facility]. [place] Pager: [phone] Office: [phone] If no answer Cell [phone]

## 2016-07-15 NOTE — Progress Notes (Signed)
[name] to be D/C'd Home per MD order. Discussed with the patient and all questions fully answered.    Medication List    STOP taking these medications   sotalol 120 MG tablet Commonly known as:  BETAPACE   torsemide 20 MG tablet Commonly known as:  DEMADEX     TAKE these medications   acetaminophen 325 MG tablet Commonly known as:  TYLENOL Take 325-650 mg by mouth every 6 (six) hours as needed for mild pain or headache.   b complex vitamins tablet Take 1 tablet by mouth daily.   ELIQUIS 5 MG Tabs tablet Generic drug:  apixaban Take 5 mg by mouth 2 (two) times daily.   ezetimibe 10 MG tablet Commonly known as:  ZETIA Take 10 mg by mouth daily.   FLAX SEED OIL PO Take 2 capsules by mouth daily.   MAGNESIUM PO Take 1 tablet by mouth daily.   ONE-A-DAY MENS 50+ ADVANTAGE Tabs Take 1 tablet by mouth daily with breakfast.   rosuvastatin 20 MG tablet Commonly known as:  CRESTOR Take 20 mg by mouth daily.   valsartan-hydrochlorothiazide 160-12.5 MG tablet Commonly known as:  DIOVAN HCT Take 1 tablet by mouth daily.   zolpidem 5 MG tablet Commonly known as:  AMBIEN Take 5 mg by mouth at bedtime.       VVS, Skin clean, dry and intact without evidence of skin break down, no evidence of skin tears noted.  IV catheter discontinued intact. Site without signs and symptoms of complications. Dressing and pressure applied.  An After Visit Summary was printed and given to the patient.  Patient escorted via [place], and D/C home via private auto.  [place] Numbers  07/15/2016 7:11 PM

## 2016-07-15 NOTE — Discharge Summary (Signed)
Physician Discharge Summary  Danny Kelley [id number] DOB: June 28, 1966 DOA: 07/14/2016  PCP: No PCP Per Patient  Admit date: 07/14/2016 Discharge date: 07/15/2016  Time spent: 60 minutes  Recommendations for Outpatient Follow-up:  1. Follow-up with Dr. Einar Gip, cardiology in 3-4 days. On follow-up basic metabolic profile need to be obtained to follow-up on electrolytes and renal function as well as a magnesium level. Repeat EKG will also need to be obtained.   Discharge Diagnoses:  Principal Problem:   Near syncope Active Problems:   Abnormal EKG   Hypotension, unspecified   QT prolongation   Paroxysmal atrial fibrillation (HCC)   Coronary artery disease due to lipid rich plaque   Essential hypertension   Chronic diastolic CHF (congestive heart failure) (HCC)   Elevated serum creatinine   Discharge Condition: Stable and improved  Diet recommendation: Heart healthy  Filed Weights   07/14/16 1631 07/14/16 2041 07/15/16 0407  Weight: 108 kg (238 lb) 108.9 kg (240 lb 1.6 oz) 109.1 kg (240 lb 8 oz)    History of present illness:  Per Dr. Nolon Rod is a 50 y.o. male with a past medical history significant for CAD s/p PCI x2 in 2014, pAF on apixaban and sotalol, and HTN who presented with pre-syncopal symptoms.  The patient experienced some vague malaise and headache over the weekend, but was otherwise in his usual health until the morning of admission, when he was walking into work, and had very sudden onset diaphoresis, dizziness (so that he had to hold onto the wall), and nausea. He was so weak and dizzy that he couldn't walk, so EMS were called, and he reported that a first responder at the scene told him his heart rate was "fast", and EMS also found his heart rate fast and irregular and his blood pressure low, and so brought him to the ER.   There was no chest pain, and he felt no palpitations or shortness of breath. Over the weekend he had had no fever, chills,  new cough, dysuria, hematuria, or other urinary symptoms.  ED course: -Afebrile, heart rate 70s, respirations 20, blood pressure 90/60, pulse oximetry normal -Na 137, K 3.4, Cr 1.36 (baseline 0.8), WBC 10.1 K, Hgb 17.6, magnesium 2.1, troponin negative -Urinalysis without pyuria, hematuria, bacteriuria -ECG showed sinus rhythm with a PVC and new inverted T waves  Of note, the patient has had paroxysmal Afib for some time, on metoprolol, followed in .  He was very symptomatic, however, and so recently was evaluated here in [place] and underwent cardioversion with Dr. Einar Gip last July.  This was successful, but he reverted after 1 week and had repeat cardioversion and started sotalol, which he had been taking regularly, for 1 month without problems.    He also noted some odd, left-sided chest wall swelling (notes it in his left breast and left abdomen, shows a picture on his phone, of very subtle *possible* chest wall swelling, never associated with leg swelling).  He was taking Demadex for this PRN, took last dose last Friday before this weekend.    Hospital Course:  #1 near syncope and hypotension secondary to probable atrial arrhythmia Patient was admitted with a near syncopal episode as well as hypotension felt to be secondary to an atrial arrhythmia. Patient was also felt to be hypovolemic as he had taken a dose of Demadex prior to this. Patient improved rapidly on arrival to the ED and was back in normal sinus after getting IV fluids. Patient  had no evidence of infection. Patient was placed on telemetry and monitored. Patient remained afebrile. Patient's HCTZ and ARB were held. Patient's sotalol dose was decreased and subsequently discontinued. Cardiac enzymes cycled were negative 2. Patient's slight hypokalemia was repleted. Patient was seen in consultation by cardiology Dr. Einar Gip, who reviewed the films. It was recommended to discontinue sotalol. Repeat EKG was done which  showed resolution of QTc prolongation. Was recommended that patient be discharged home with close outpatient follow-up with cardiology. Patient patient did not have any further episodes. Patient be discharged home in stable and improved condition.  #2 QTc prolongation/abnormal EKG Patient was seen in consultation by cardiology. It was felt patient's QTc prolongation/abnormal EKG was secondary to sotalol. Sotalol was subsequently discontinued. EKG was repeated that showed resolution of QTc prolongation. Patient will follow-up with cardiology in the outpatient setting approximately 3-4 days.  #3 chronic diastolic heart failure Remained stable.  #4 obesity with obstructive sleep apnea Stable.  #5 hypertension Stable.  #6 hypokalemia Repleted.  Procedures:  None  Consultations:  Cardiology: Dr. Einar Gip 07/15/2016  Discharge Exam: Vitals:   07/15/16 0409 07/15/16 1235  BP: 109/66 (!) 134/94  Pulse:  63  Resp:  18  Temp:  98.3 F (36.8 C)    General: NAD Cardiovascular: RRR Respiratory: CTAB  Discharge Instructions   Discharge Instructions    Diet - low sodium heart healthy    Complete by:  As directed   Discharge instructions    Complete by:  As directed   Stop taking sotalol and demadex. Follow up with Dr Einar Gip.   Increase activity slowly    Complete by:  As directed     Current Discharge Medication List    CONTINUE these medications which have NOT CHANGED   Details  acetaminophen (TYLENOL) 325 MG tablet Take 325-650 mg by mouth every 6 (six) hours as needed for mild pain or headache.    apixaban (ELIQUIS) 5 MG TABS tablet Take 5 mg by mouth 2 (two) times daily.    b complex vitamins tablet Take 1 tablet by mouth daily.    ezetimibe (ZETIA) 10 MG tablet Take 10 mg by mouth daily.    Flaxseed, Linseed, (FLAX SEED OIL PO) Take 2 capsules by mouth daily.    MAGNESIUM PO Take 1 tablet by mouth daily.    Multiple Vitamins-Minerals (ONE-A-DAY MENS 50+ ADVANTAGE)  TABS Take 1 tablet by mouth daily with breakfast.    rosuvastatin (CRESTOR) 20 MG tablet Take 20 mg by mouth daily.    valsartan-hydrochlorothiazide (DIOVAN HCT) 160-12.5 MG tablet Take 1 tablet by mouth daily. Qty: 30 tablet, Refills: 1    zolpidem (AMBIEN) 5 MG tablet Take 5 mg by mouth at bedtime.      STOP taking these medications     sotalol (BETAPACE) 120 MG tablet      torsemide (DEMADEX) 20 MG tablet        Allergies  Allergen Reactions  . Erythromycin Other (See Comments)    Hallucinations    Follow-up Information    [name], MD Follow up in 3 day(s).   Specialty:  Cardiology Why:  Office will call with appointment time to be seen either this Friday or on Monday. Contact information: [address] [place] [address] [phone]            The results of significant diagnostics from this hospitalization (including imaging, microbiology, ancillary and laboratory) are listed below for reference.    Significant Diagnostic Studies:  No results found.  Microbiology: No results found for this or any previous visit (from the past 240 hour(s)).   Labs: Basic Metabolic Panel:  Recent Labs Lab 07/14/16 1630 07/15/16 0012  NA 137 139  K 3.4* 3.7  CL 102 106  CO2 24 27  GLUCOSE 177* 124*  BUN 23* 23*  CREATININE 1.36* 1.13  CALCIUM 9.2 8.6*  MG 2.1  --    Liver Function Tests: No results for input(s): AST, ALT, ALKPHOS, BILITOT, PROT, ALBUMIN in the last 168 hours. No results for input(s): LIPASE, AMYLASE in the last 168 hours. No results for input(s): AMMONIA in the last 168 hours. CBC:  Recent Labs Lab 07/14/16 1630 07/15/16 0012  WBC 10.1 11.5*  HGB 17.6* 16.4  HCT 50.1 47.6  MCV 84.5 84.5  PLT 248 229   Cardiac Enzymes:  Recent Labs Lab 07/14/16 1759 07/15/16 0012  TROPONINI <0.03 <0.03   BNP: BNP (last 3 results) No results for input(s): BNP in the last 8760 hours.  ProBNP (last 3 results) No results for  input(s): PROBNP in the last 8760 hours.  CBG: No results for input(s): GLUCAP in the last 168 hours.     SignedIrine Seal MD.  [facility] 07/15/2016, 6:57 PM

## 2016-08-20 ENCOUNTER — Institutional Professional Consult (permissible substitution): Payer: Self-pay | Admitting: Neurology

## 2016-08-20 ENCOUNTER — Telehealth: Payer: Self-pay

## 2016-08-20 NOTE — Telephone Encounter (Signed)
Pt did not show for their appt with Dr. Dohmeier today.  

## 2016-08-21 ENCOUNTER — Encounter: Payer: Self-pay | Admitting: Neurology

## 2016-09-02 ENCOUNTER — Encounter: Payer: Self-pay | Admitting: Internal Medicine

## 2016-09-07 ENCOUNTER — Encounter (HOSPITAL_COMMUNITY): Payer: Self-pay

## 2016-09-07 ENCOUNTER — Inpatient Hospital Stay (HOSPITAL_COMMUNITY)
Admission: RE | Admit: 2016-09-07 | Discharge: 2016-09-09 | DRG: 309 | Disposition: A | Discharge status: Home / Self Care | Payer: BLUE CROSS/BLUE SHIELD | Source: Ambulatory Visit | Attending: Cardiology | Admitting: Cardiology

## 2016-09-07 DIAGNOSIS — E668 Other obesity: Secondary | ICD-10-CM | POA: Diagnosis present

## 2016-09-07 DIAGNOSIS — I110 Hypertensive heart disease with heart failure: Secondary | ICD-10-CM | POA: Diagnosis present

## 2016-09-07 DIAGNOSIS — Z833 Family history of diabetes mellitus: Secondary | ICD-10-CM

## 2016-09-07 DIAGNOSIS — E7800 Pure hypercholesterolemia, unspecified: Secondary | ICD-10-CM | POA: Diagnosis present

## 2016-09-07 DIAGNOSIS — M1710 Unilateral primary osteoarthritis, unspecified knee: Secondary | ICD-10-CM | POA: Diagnosis present

## 2016-09-07 DIAGNOSIS — G4709 Other insomnia: Secondary | ICD-10-CM | POA: Diagnosis present

## 2016-09-07 DIAGNOSIS — M79606 Pain in leg, unspecified: Secondary | ICD-10-CM | POA: Diagnosis present

## 2016-09-07 DIAGNOSIS — I4581 Long QT syndrome: Secondary | ICD-10-CM | POA: Diagnosis not present

## 2016-09-07 DIAGNOSIS — Z881 Allergy status to other antibiotic agents status: Secondary | ICD-10-CM | POA: Diagnosis not present

## 2016-09-07 DIAGNOSIS — I480 Paroxysmal atrial fibrillation: Secondary | ICD-10-CM | POA: Diagnosis present

## 2016-09-07 DIAGNOSIS — Z823 Family history of stroke: Secondary | ICD-10-CM | POA: Diagnosis not present

## 2016-09-07 DIAGNOSIS — I2510 Atherosclerotic heart disease of native coronary artery without angina pectoris: Secondary | ICD-10-CM | POA: Diagnosis present

## 2016-09-07 DIAGNOSIS — G4733 Obstructive sleep apnea (adult) (pediatric): Secondary | ICD-10-CM | POA: Diagnosis present

## 2016-09-07 DIAGNOSIS — Z87891 Personal history of nicotine dependence: Secondary | ICD-10-CM

## 2016-09-07 DIAGNOSIS — Z955 Presence of coronary angioplasty implant and graft: Secondary | ICD-10-CM

## 2016-09-07 DIAGNOSIS — Z7902 Long term (current) use of antithrombotics/antiplatelets: Secondary | ICD-10-CM

## 2016-09-07 DIAGNOSIS — Z79899 Other long term (current) drug therapy: Secondary | ICD-10-CM | POA: Diagnosis not present

## 2016-09-07 DIAGNOSIS — Z6835 Body mass index (BMI) 35.0-35.9, adult: Secondary | ICD-10-CM | POA: Diagnosis not present

## 2016-09-07 DIAGNOSIS — R109 Unspecified abdominal pain: Secondary | ICD-10-CM | POA: Diagnosis present

## 2016-09-07 DIAGNOSIS — T466X5A Adverse effect of antihyperlipidemic and antiarteriosclerotic drugs, initial encounter: Secondary | ICD-10-CM | POA: Diagnosis present

## 2016-09-07 DIAGNOSIS — G4489 Other headache syndrome: Secondary | ICD-10-CM | POA: Diagnosis present

## 2016-09-07 DIAGNOSIS — T462X5A Adverse effect of other antidysrhythmic drugs, initial encounter: Secondary | ICD-10-CM | POA: Diagnosis not present

## 2016-09-07 DIAGNOSIS — Z8249 Family history of ischemic heart disease and other diseases of the circulatory system: Secondary | ICD-10-CM

## 2016-09-07 DIAGNOSIS — I5032 Chronic diastolic (congestive) heart failure: Secondary | ICD-10-CM | POA: Diagnosis present

## 2016-09-07 DIAGNOSIS — Z885 Allergy status to narcotic agent status: Secondary | ICD-10-CM | POA: Diagnosis not present

## 2016-09-07 LAB — BASIC METABOLIC PANEL
ANION GAP: 6 (ref 5–15)
BUN: 12 mg/dL (ref 6–20)
CALCIUM: 9.2 mg/dL (ref 8.9–10.3)
CHLORIDE: 105 mmol/L (ref 101–111)
CO2: 26 mmol/L (ref 22–32)
Creatinine, Ser: 0.69 mg/dL (ref 0.61–1.24)
GFR calc Af Amer: >60 mL/min (ref >60)
GFR calc non Af Amer: >60 mL/min (ref >60)
GLUCOSE: 98 mg/dL (ref 65–99)
POTASSIUM: 4 mmol/L (ref 3.5–5.1)
Sodium: 137 mmol/L (ref 135–145)

## 2016-09-07 LAB — MAGNESIUM: Magnesium: 2.3 mg/dL (ref 1.7–2.4)

## 2016-09-07 MED ORDER — LORATADINE 10 MG PO TABS
10.0000 mg | ORAL_TABLET | Freq: Every day | ORAL | Status: DC
  Administered 2016-09-08 – 2016-09-09 (×2): 10 mg via ORAL
  Filled 2016-09-07 (×2): qty 1

## 2016-09-07 MED ORDER — CARVEDILOL 6.25 MG PO TABS
6.2500 mg | ORAL_TABLET | Freq: Two times a day (BID) | ORAL | Status: DC
  Administered 2016-09-07: 6.25 mg via ORAL
  Filled 2016-09-07: qty 1

## 2016-09-07 MED ORDER — VITAMIN B-12 1000 MCG PO TABS
1000.0000 ug | ORAL_TABLET | Freq: Every day | ORAL | Status: DC
  Administered 2016-09-08 – 2016-09-09 (×2): 1000 ug via ORAL
  Filled 2016-09-07 (×2): qty 1

## 2016-09-07 MED ORDER — SODIUM CHLORIDE 0.9% FLUSH: 3.0000 mL | INTRAVENOUS | Status: DC | PRN

## 2016-09-07 MED ORDER — POTASSIUM CHLORIDE CRYS ER 20 MEQ PO TBCR
20.0000 meq | EXTENDED_RELEASE_TABLET | Freq: Every day | ORAL | Status: DC | PRN
  Administered 2016-09-08: 20 meq via ORAL
  Filled 2016-09-07: qty 1

## 2016-09-07 MED ORDER — SODIUM CHLORIDE 0.9 % IV SOLN: 250.0000 mL | INTRAVENOUS | Status: DC | PRN

## 2016-09-07 MED ORDER — SODIUM CHLORIDE 0.9% FLUSH
3.0000 mL | Freq: Two times a day (BID) | INTRAVENOUS | Status: DC
  Administered 2016-09-07 – 2016-09-09 (×4): 3 mL via INTRAVENOUS

## 2016-09-07 MED ORDER — SPIRONOLACTONE 25 MG PO TABS
25.0000 mg | ORAL_TABLET | Freq: Every day | ORAL | Status: DC
  Administered 2016-09-08 – 2016-09-09 (×2): 25 mg via ORAL
  Filled 2016-09-07 (×2): qty 1

## 2016-09-07 MED ORDER — ACETAMINOPHEN 325 MG PO TABS
650.0000 mg | ORAL_TABLET | ORAL | Status: DC | PRN
  Administered 2016-09-07 – 2016-09-09 (×2): 650 mg via ORAL
  Filled 2016-09-07 (×2): qty 2

## 2016-09-07 MED ORDER — IRBESARTAN 150 MG PO TABS
150.0000 mg | ORAL_TABLET | Freq: Every day | ORAL | Status: DC
  Administered 2016-09-08: 150 mg via ORAL
  Filled 2016-09-07: qty 1

## 2016-09-07 MED ORDER — ZOLPIDEM TARTRATE 5 MG PO TABS
5.0000 mg | ORAL_TABLET | Freq: Every day | ORAL | Status: DC
  Administered 2016-09-07 – 2016-09-08 (×2): 5 mg via ORAL
  Filled 2016-09-07 (×2): qty 1

## 2016-09-07 MED ORDER — BISOPROLOL FUMARATE 5 MG PO TABS
10.0000 mg | ORAL_TABLET | Freq: Every day | ORAL | Status: DC
  Administered 2016-09-08: 10 mg via ORAL
  Filled 2016-09-07: qty 2

## 2016-09-07 MED ORDER — APIXABAN 5 MG PO TABS
5.0000 mg | ORAL_TABLET | Freq: Two times a day (BID) | ORAL | Status: DC
  Administered 2016-09-07 – 2016-09-09 (×4): 5 mg via ORAL
  Filled 2016-09-07 (×4): qty 1

## 2016-09-07 MED ORDER — ADULT MULTIVITAMIN W/MINERALS CH
1.0000 | ORAL_TABLET | Freq: Every day | ORAL | Status: DC
  Administered 2016-09-08 – 2016-09-09 (×2): 1 via ORAL
  Filled 2016-09-07 (×2): qty 1

## 2016-09-07 MED ORDER — MAGNESIUM OXIDE 400 (241.3 MG) MG PO TABS
200.0000 mg | ORAL_TABLET | Freq: Three times a day (TID) | ORAL | Status: DC
Start: 2016-09-07 — End: 2016-09-09
  Administered 2016-09-07 – 2016-09-09 (×5): 200 mg via ORAL
  Filled 2016-09-07 (×5): qty 1

## 2016-09-07 MED ORDER — MAGNESIUM OXIDE 400 (241.3 MG) MG PO TABS: 400.0000 mg | ORAL_TABLET | Freq: Every day | ORAL | Status: DC | PRN

## 2016-09-07 MED ORDER — EZETIMIBE 10 MG PO TABS
10.0000 mg | ORAL_TABLET | Freq: Every day | ORAL | Status: DC
  Administered 2016-09-08 – 2016-09-09 (×2): 10 mg via ORAL
  Filled 2016-09-07 (×2): qty 1

## 2016-09-07 MED ORDER — ROSUVASTATIN CALCIUM 10 MG PO TABS
20.0000 mg | ORAL_TABLET | Freq: Every day | ORAL | Status: DC
  Filled 2016-09-07: qty 2

## 2016-09-07 MED ORDER — DOFETILIDE 250 MCG PO CAPS
500.0000 ug | ORAL_CAPSULE | Freq: Two times a day (BID) | ORAL | Status: DC
  Administered 2016-09-07: 500 ug via ORAL
  Filled 2016-09-07 (×2): qty 2

## 2016-09-07 NOTE — Progress Notes (Signed)
Outreach made to attending.  Received verbal order to use cardiology PRN order set.  Order entered.

## 2016-09-07 NOTE — Progress Notes (Signed)
Pharmacy Review for Dofetilide (Tikosyn) Initiation  Admit Complaint: 50 y.o. male admitted 09/07/2016 with atrial fibrillation to be initiated on dofetilide.   Assessment:  Patient Exclusion Criteria: If any screening criteria checked as "Yes", then  patient  should NOT receive dofetilide until criteria item is corrected. If "Yes" please indicate correction plan.  YES  NO Patient  Exclusion Criteria Correction Plan  []  [x]  Baseline QTc interval is greater than or equal to 440 msec. IF above YES box checked dofetilide contraindicated unless patient has ICD; then may proceed if QTc 500-550 msec or with known ventricular conduction abnormalities may proceed with QTc 550-600 msec. QTc =  430   []  [x]  Magnesium level is less than 1.8 mEq/l : Last magnesium:  Lab Results  Component Value Date   MG 2.3 09/07/2016         []  [x]  Potassium level is less than 4 mEq/l : Last potassium:  Lab Results  Component Value Date   K 4.0 09/07/2016         []  [x]  Patient is known or suspected to have a digoxin level greater than 2 ng/ml: No results found for: DIGOXIN    []  [x]  Creatinine clearance less than 20 ml/min (calculated using Cockcroft-Gault, actual body weight and serum creatinine): Estimated Creatinine Clearance: 143.2 mL/min (by C-G formula based on SCr of 0.69 mg/dL).    []  [x]  Patient has received drugs known to prolong the QT intervals within the last 48 hours (phenothiazines, tricyclics or tetracyclic antidepressants, erythromycin, H-1 antihistamines, cisapride, fluoroquinolones, azithromycin). Drugs not listed above may have an, as yet, undetected potential to prolong the QT interval, updated information on QT prolonging agents is available at this website:QT prolonging agents   [x]  []  Patient received a dose of hydrochlorothiazide (Oretic) alone or in any combination including triamterene (Dyazide, Maxzide) in the last 48 hours. Diovan-HCT pta w/ last dose this morning. Spoke to Dr.  Einar Gip who was ok starting tikosyn since QTc was normal.   []  [x]  Patient received a medication known to increase dofetilide plasma concentrations prior to initial dofetilide dose:  . Trimethoprim (Primsol, Proloprim) in the last 36 hours . Verapamil (Calan, Verelan) in the last 36 hours or a sustained release dose in the last 72 hours . Megestrol (Megace) in the last 5 days  . Cimetidine (Tagamet) in the last 6 hours . Ketoconazole (Nizoral) in the last 24 hours . Itraconazole (Sporanox) in the last 48 hours  . Prochlorperazine (Compazine) in the last 36 hours    []  [x]  Patient is known to have a history of torsades de pointes; congenital or acquired long QT syndromes.   []  [x]  Patient has received a Class 1 antiarrhythmic with less than 2 half-lives since last dose. (Disopyramide, Quinidine, Procainamide, Lidocaine, Mexiletine, Flecainide, Propafenone)   []  [x]  Patient has received amiodarone therapy in the past 3 months or amiodarone level is greater than 0.3 ng/ml.    Patient has been appropriately anticoagulated with Apixaban.  Ordering provider was confirmed at [email or web address] if they are not listed on the [place] Prescribers list.  Goal of Therapy: Follow renal function, electrolytes, potential drug interactions, and dose adjustment. Provide education and 1 week supply at discharge.  Plan:  [x]   Physician selected initial dose within range recommended for patients level of renal function - will monitor for response.  []   Physician selected initial dose outside of range recommended for patients level of renal function - will discuss if the dose should be altered  at this time.   Select One Calculated CrCl  Dose q12h  [x]  > 60 ml/min 500 mcg  []  40-60 ml/min 250 mcg  []  20-40 ml/min 125 mcg   2. Follow up QTc after the first 5 doses, renal function, electrolytes (K & Mg) daily x 3     days, dose adjustment, success of initiation and facilitate 1 week discharge supply  as     clinically indicated.  3. Initiate Tikosyn education video (Call [phone] and ask for Tikosyn Video # [phone]).  4. Place Enrollment Form on the chart for discharge supply of dofetilide.   Deboraha Sprang 3:18 PM 09/07/2016

## 2016-09-07 NOTE — H&P (Signed)
OFFICE VISIT NOTES COPIED TO EPIC FOR DOCUMENTATION  . History of Present Illness Danny Page MD; 09-25-16 2:07 PM) Patient words: Acute visit for BP, HR; Last OV 07/18/2016; pt states his BP was 192/92 yesterday with irregular heart rate. He c/o pain on right flank pain, extreme leg pain to where he can barely walk and atributes to Crestor.  The patient is a 50 year old male who presents for a follow-up for Atrial fibrillation. He has a history of CAD status post PCI and stent placement to left Cx and OM1 in 2014, hypertension, hyperlipidemia, and recent history of tobacco use, quit in March 2017. Family history significant for premature CAD in both parents, mother had CABG at age 52 and father had MI at age 61. He has obstructive sleep apnea but has not been using CPAP awaiting insurance to supply him the machine.  Patient underwent direct current cardioversion on 05/27/2016, felt well for almost a week and then was back in A. Fib. He was admitted for sotalol administration and therapeutic drug monitoring for persistent atrial fibrillation, underwent successful cardioversion, did well for one month, however presented to [facility] on 07/14/2016 with near syncope and was found to have markedly prolonged QT interval, low blood pressure. Sotalol has been discontinued. He was started on Multaq and had maintained sinus until yesterday 09/01/16 stating he does not feel well and was back in A. Fib. He now presents here for add-on visit. Please see his complaints in his own words. Denies any chest pain or shortness of breath.   Problem List/Past Medical Anderson Malta Sergeant; September 25, 2016 12:22 PM) Paroxysmal atrial fibrillation (I48.0)  Direct current cardioversion ( Ist 05/27/16) 06/11/2016: 150x2, 200 x1 to NSR on Sotalol. CHA2DS2-VASc Score is 2 with yearly risk of stroke of 2.2%. (CHF; HTN; Age <34, 49-74 and >62; DM; stroke; vasc disease; gender) Atherosclerosis of native coronary  artery of native heart without angina pectoris (I25.10)  Coronary angiogram 05/30/2013: 3.5 x 18 mm Xience DES to dominant distal left circumflex, 2.5 x 12 mm Xience to OM1. Lexiscan Myoview stress test 01/08/2016: Normal sinus rhythm, nondiagnostic stress EKG, fixed apical defect consistent with scar, apical hypokinesis. LVEF 36%. No evidence of ischemia. OSA (obstructive sleep apnea) (G47.33)  Does not use CPAP Chronic diastolic heart failure (99991111)  Echocardiogram 05/13/2016: Mild LVH, LVEF 40-45%. Left atrium moderately dilated. CXR 01/07/2016: cardiomegaly and pulmonary vascular congestion Echocardiogram 04/17/2014: LVEF 55-60%. Left atrium is normal in size. Benign essential hypertension (I10)  Hypercholesteremia (E78.00)  Other insomnia (G47.09)  Labwork  Labs 06/16/2016: HB 15.8/HCT 45.7, platelets on 41. Normal indicis. BUN 15, serum creatinine 1.23, potassium 5.4. CMP otherwise normal with normal liver enzymes. 05/23/2016: Creatinine 1.19, potassium 5.6 05/12/2016: CBC normal, creatinine 0.9, potassium 4.1, BNP 2160, total cholesterol 225, triglycerides 99, HDL 60, LDL 145, HbA1c 5.9% Headache, vasomotor (G44.009)  Arthritis of knee (M17.10)  S/P PTCA (percutaneous transluminal coronary angioplasty) (Z98.61) 05/30/2013  Allergies Anderson Malta Sergeant; 09-25-2016 12:22 PM) Erythromycins  Percocet *ANALGESICS - OPIOID*  Itching.  Family History Anderson Malta Sergeant; 09-25-16 12:22 PM) Mother  Deceased. around age 75, cancer, heart issues Father  Deceased. at age 61, chf Sister 1  older, 2 MI's and TIA, diabetes; pt doesnt known age of when these happened  Social History Lolita Lenz; 2016-09-25 12:22 PM) Current tobacco use  Former smoker. quit 01/07/2015, smoked for 20 years Non Drinker/No Alcohol Use  Marital status  Married. Living Situation  Lives with spouse. Number of Children  2.  Past  Surgical History Anderson Malta Sergeant; 09/02/2016 12:22 PM) Appendectomy   knee surgery  esophageal reconstruction  Stents placed 2014 Cholecystectomy 04/2014  Medication History Anderson Malta Sergeant; 09/02/2016 12:25 PM) Zolpidem Tartrate (5MG  Tablet, 1 Tablet Oral at bedtime, Taken starting 08/17/2016) Active. Valsartan-Hydrochlorothiazide (160-12.5MG  Tablet, 1 Tablet Oral daily, Taken starting 08/17/2016) Active. Bisoprolol Fumarate (10MG  Tablet, 1 (one) Tablet Oral daily, Taken starting 07/30/2016) Active. (D/C Metoprolol) Multaq (400MG  Tablet, 1 (one) Tablet Oral two times daily, Taken starting 07/18/2016) Active. Eliquis (5MG  Tablet, 1 Tablet Tablet Tablet Oral two times daily, Taken starting 06/13/2016) Active. Zetia (10MG  Tablet, 1 Tablet Tablet Tablet Tablet Oral daily, Taken starting 05/25/2016) Active. Crestor (20MG  Tablet, 1 (one) Tablet Tablet Tablet T Oral daily, Taken starting 05/25/2016) Active. Fexofenadine HCl (180MG  Tablet, 1 Oral as needed) Active. Dulcolax Stool Softener (10mg  Oral as needed) Specific dose unknown - Active. Calcium Carbonate (Antacid) (500MG  Tablet Chewable, 1 Oral daily) Active. Cholecalciferol (2000UNIT Capsule, 1 Oral daily) Active. (Vitamin D3) Cyanocobalamin (1000MCG Tablet, 1 Oral daily) Active. (Vitamin 123456) Garlic (300MG  Tablet, 1 Oral daily) Active. Magnesium Oxide (250MG  Tablet, 1 Oral daily) Active. Multivital (1 Oral daily) Active. Fish Oil (1,400mg  Oral daily) Specific dose unknown - Active. Tumeric Root Extract (1000mg  daily) Active. B Complex + C TR (1 Oral daily) Active. Zinc (100MG  Tablet, 1 Oral daily) Active. Flax Seed Oil (1000MG  Capsule, 2400mg  Oral daily) Active. Medications Reconciled (verbally)  Diagnostic Studies History Anderson Malta Sergeant; 09/02/2016 12:22 PM) Nuclear stress test 05/27/2013 Only able to achieve 65% of MPHR, stress EKG inconclusive. Focal antero-apical ischemia. Normal LVEF at 52%. Coronary Angiogram 05/30/2013 3.5 x 18 mm Xience DES to dominant distal  left circumflex, 2.5 x 12 mm Xience to OM1. Nuclear Stress Test 04/01/2014 Negative for evidence of ischemia. LVEF 51%. Echocardiogram 04/17/2014 LVEF 55-60%. Left atrium is normal in size. Chest X-ray 01/07/2016 Cardiomegaly and pulmonary vascular congestion Lexiscan myoview stress 01/08/2016 Normal sinus rhythm, nondiagnostic stress EKG, fixed apical defect consistent with scar, apical hypokinesis. LVEF 36%. No evidence of ischemia. Echocardiogram 05/13/2016 Mild LVH, LVEF 40-45%. Left atrium moderately dilated. Cardioversion 05/27/2016 Direct current cardioversion: 120J, 150J & 200J, A. Fib to NSR.    Review of Systems Danny Page MD; 09/02/2016 2:07 PM) General Present- Fatigue. Not Present- Anorexia and Fever. Respiratory Present- Decreased Exercise Tolerance. Cardiovascular Not Present- Chest Pain, Claudications, Edema, Orthopnea, Palpitations and Paroxysmal Nocturnal Dyspnea. Gastrointestinal Not Present- Black, Tarry Stool, Change in Bowel Habits and Nausea. Neurological Present- Headaches. Not Present- Focal Neurological Symptoms and Syncope. Endocrine Not Present- Cold Intolerance, Excessive Sweating, Heat Intolerance and Thyroid Problems. Hematology Not Present- Anemia, Easy Bruising, Petechiae and Prolonged Bleeding.  Vitals Danny Page MD; 09/02/2016 2:07 PM) 09/02/2016 12:22 PM Weight: 246.06 lb Height: 70in Body Surface Area: 2.28 m Body Mass Index: 35.31 kg/m  Pulse: 70 (Irregular)  P.OX: 95% (Room air) BP: 118/80 (Sitting, Left Arm, Standard)       Physical Exam Danny Page MD; 09/02/2016 2:08 PM) General Mental Status-Alert. General Appearance-Cooperative and Appears stated age. Build & Nutrition-Well built and Moderately obese.  Head and Neck Thyroid Gland Characteristics - normal size and consistency and no palpable nodules.  Chest and Lung Exam Chest and lung exam reveals -quiet, even and easy respiratory  effort with no use of accessory muscles, non-tender and on auscultation, normal breath sounds, no adventitious sounds.  Cardiovascular Cardiovascular examination reveals -carotid auscultation reveals no bruits, abdominal aorta auscultation reveals no bruits and no prominent pulsation, femoral artery auscultation bilaterally reveals normal pulses, no  bruits, no thrills, normal pedal pulses bilaterally and no digital clubbing, cyanosis, edema, increased warmth or tenderness. Auscultation Rhythm - Irregular. Heart Sounds - S1 is variable and S2 WNL. Murmurs & Other Heart Sounds - Murmur - No murmur.  Abdomen Palpation/Percussion Normal exam - Non Tender and No hepatosplenomegaly.  Neurologic Neurologic evaluation reveals -alert and oriented x 3 with no impairment of recent or remote memory. Motor-Grossly intact without any focal deficits.  Musculoskeletal Global Assessment Left Lower Extremity - no deformities, masses or tenderness, no known fractures. Right Lower Extremity - no deformities, masses or tenderness, no known fractures.    Assessment & Plan Danny Page MD; 09/02/2016 2:11 PM) Paroxysmal atrial fibrillation (I48.0) Story: Direct current cardioversion ( Ist 05/27/16) 06/11/2016: 150x2, 200 x1 to NSR on Sotalol.  CHA2DS2-VASc Score is 2 with yearly risk of stroke of 2.2%. (CHF; HTN; Age <65, 24-74 and >75; DM; stroke; vasc disease; gender) Impression: EKG 09/02/2016: Atrial fibrillation with controlled ventricular response at the rate of 68 bpm, normal axis, normal QT interval.  EKG 9/50/2017: Normal sinus rhythm at rate of 77 bpm, normal axis. Nonspecific T abnormality. Normal QT interval 430 ms.  EKG 06/05/2016: Atrial fibrillation with controlled ventricular response at the rate of 91 bpm, normal axis, diffuse nonspecific T-wave abnormality. No significant change from EKG 05/23/2016. Current Plans Complete electrocardiogram (93000) Atherosclerosis of native  coronary artery of native heart without angina pectoris (I25.10) Story: Coronary angiogram 05/30/2013: 3.5 x 18 mm Xience DES to dominant distal left circumflex, 2.5 x 12 mm Xience to OM1.  Lexiscan Myoview stress test 01/08/2016: Normal sinus rhythm, nondiagnostic stress EKG, fixed apical defect consistent with scar, apical hypokinesis. LVEF 36%. No evidence of ischemia. Labwork Story: Labs 06/16/2016: HB 15.8/HCT 45.7, platelets on 41. Normal indicis. BUN 15, serum creatinine 1.23, potassium 5.4. CMP otherwise normal with normal liver enzymes.  05/23/2016: Creatinine 1.19, potassium 5.6  05/12/2016: CBC normal, creatinine 0.9, potassium 4.1, BNP 2160, total cholesterol 225, triglycerides 99, HDL 60, LDL 145, HbA1c 5.9% Benign essential hypertension (I10) OSA (obstructive sleep apnea) (G47.33) Story: Has still not scheduled for CPAP titration. Current Plans Mechanism of underlying disease process and action of medications discussed with the patient. He is back in atrial fibrillation with controlled ventricular response. Advised him to discontinue Multac as it has been ineffective. His only options are in amiodarone which I'm very reluctant to starting due to long-term side effects. Dofetilide would be a second choice, but needs admission and therapeutic drug monitoring. Patient is willing to do this. Advised him to discontinue Multac 3 days prior to initiation of dofetilide and patient will be admitted to the hospital over the next 4 days and if he does not convert to sinus rhythm, will need direct current cardioversion.  Otherwise blood pressure is well controlled. He has developed severe myalgias on Crestor, previously did not tolerate Lipitor, advised him to have a statin free holiday for 2 weeks followed by decreasing the dose from 20 mg to 10 mg.  With regard to his right flank pain, it is clearly musculoskeletal and easily reproducible. I'll simply reassured him. I'll see him back in the office  in 2 weeks. His blood pressure is well controlled, discussed regarding sleep apnea.  Signed by Danny Page, MD (09/02/2016 2:11 PM)

## 2016-09-08 ENCOUNTER — Other Ambulatory Visit: Payer: Self-pay

## 2016-09-08 ENCOUNTER — Encounter (HOSPITAL_COMMUNITY): Payer: Self-pay | Admitting: *Deleted

## 2016-09-08 LAB — BASIC METABOLIC PANEL
Anion gap: 8 (ref 5–15)
BUN: 10 mg/dL (ref 6–20)
CO2: 27 mmol/L (ref 22–32)
CREATININE: 0.78 mg/dL (ref 0.61–1.24)
Calcium: 8.5 mg/dL — ABNORMAL LOW (ref 8.9–10.3)
Chloride: 102 mmol/L (ref 101–111)
Glucose, Bld: 93 mg/dL (ref 65–99)
POTASSIUM: 3.6 mmol/L (ref 3.5–5.1)
SODIUM: 137 mmol/L (ref 135–145)

## 2016-09-08 LAB — MAGNESIUM: MAGNESIUM: 2.3 mg/dL (ref 1.7–2.4)

## 2016-09-08 MED ORDER — ROSUVASTATIN CALCIUM 10 MG PO TABS: 10.0000 mg | ORAL_TABLET | Freq: Every day | ORAL | Status: DC

## 2016-09-08 MED ORDER — IRBESARTAN 300 MG PO TABS
300.0000 mg | ORAL_TABLET | Freq: Every day | ORAL | Status: DC
  Administered 2016-09-09: 300 mg via ORAL
  Filled 2016-09-08: qty 1

## 2016-09-08 MED ORDER — BISOPROLOL FUMARATE 5 MG PO TABS
20.0000 mg | ORAL_TABLET | Freq: Every day | ORAL | Status: DC
  Administered 2016-09-09: 20 mg via ORAL
  Filled 2016-09-08: qty 4

## 2016-09-08 MED ORDER — DOFETILIDE 250 MCG PO CAPS
250.0000 ug | ORAL_CAPSULE | Freq: Once | ORAL | Status: AC
  Administered 2016-09-08: 250 ug via ORAL

## 2016-09-08 NOTE — Care Management Note (Addendum)
Case Management Note  Patient Details  Name: Danny Kelley MRN: [id number] Date of Birth: 05/26/1966  Subjective/Objective:      Afib, CAD, Tikosyn Load           Action/Plan: Discharge Planning: Chart reviewed. Tikosyn dc. Pt states his copay for his medications are $0 dollars.   Expected Discharge Date:  09/08/2016               Expected Discharge Plan:  Home/Self Care  In-House Referral:  NA  Discharge planning Services  NA  Post Acute Care Choice:  NA Choice offered to:     DME Arranged:  N/A DME Agency:  NA  HH Arranged:  NA HH Agency:  NA  Status of Service:  Completed, signed off  If discussed at [place] of Stay Meetings, dates discussed:    Additional Comments:  Erenest Rasher, RN 09/08/2016, 2:02 PM

## 2016-09-08 NOTE — Progress Notes (Signed)
Subjective:  Doing well and remains asymptomatic. States he could tell he was back in sinus rhythm 2 days ago.  Objective:  Vital Signs in the last 24 hours: Temp:  [97.8 F (36.6 C)-98.7 F (37.1 C)] 97.8 F (36.6 C) (11/06 0343) Pulse Rate:  [62-80] 62 (11/06 0343) Resp:  [16-20] 18 (11/06 0343) BP: (140-163)/(84-100) 146/90 (11/06 0343) SpO2:  [96 %] 96 % (11/06 0343) Weight:  [112.2 kg (247 lb 5.7 oz)] 112.2 kg (247 lb 5.7 oz) (11/05 1321)   Blood pressure (!) 144/91, pulse 64, temperature 97.8 F (36.6 C), temperature source Oral, resp. rate 20, height 5\' 10"  (1.778 m), weight 112.2 kg (247 lb 5.7 oz), SpO2 97 %.   Intake/Output from previous day: No intake/output data recorded.  Physical Exam: General appearance: alert, cooperative, appears older than stated age, no distress and moderately obese Lungs: clear to auscultation bilaterally Heart: regular rate and rhythm, S1, S2 normal, no murmur, click, rub or gallop Abdomen: soft, non-tender; bowel sounds normal; no masses,  no organomegaly Extremities: extremities normal, atraumatic, no cyanosis or edema Pulses: 2+ and symmetric Neurologic: Grossly normal Lab Results: BMP  Recent Labs  07/15/16 0012 09/07/16 1313 09/08/16 0332  NA 139 137 137  K 3.7 4.0 3.6  CL 106 105 102  CO2 27 26 27   GLUCOSE 124* 98 93  BUN 23* 12 10  CREATININE 1.13 0.69 0.78  CALCIUM 8.6* 9.2 8.5*  GFRNONAA >60 >60 >60  GFRAA >60 >60 >60   Cardiac Studies:  EKG: Normal sinus rhythm, normal axis, QT prolonged at 580 ms.  Compared to admission EKG, QT prolongation is new  Echocardiogram 08/07/2016: Left ventricle cavity is mildly dilated at 5.6 cm. Moderate concentric hypertrophy of the left ventricle. Normal global wall motion. Normal diastolic filling pattern. Calculated EF 56%. Left atrial cavity is moderately dilated at 4.7 cm. Trace tricuspid regurgitation. Unable to estimate PA pressure due to absence/minimal TR  signal..  Lexiscan sestamibi 01/08/2016: Normal sinus rhythm, nondiagnostic stress EKG, fixed apical defect insistent with scar, apical hypokinesis. LVEF 36%. No evidence of ischemia.  Coronary angiogram 05/30/2013: 3.5 x 18 mm Xience DES to dominant distal left circumflex, 2.5 x 12 mm Xience to OM1.  Scheduled Meds: . apixaban  5 mg Oral BID  . bisoprolol  10 mg Oral Daily  . dofetilide  500 mcg Oral BID  . ezetimibe  10 mg Oral Daily  . irbesartan  150 mg Oral Daily  . loratadine  10 mg Oral Daily  . magnesium oxide  200 mg Oral TID  . multivitamin with minerals  1 tablet Oral Q breakfast  . [START ON 09/16/2016] rosuvastatin  10 mg Oral Daily  . sodium chloride flush  3 mL Intravenous Q12H  . spironolactone  25 mg Oral Daily  . cyanocobalamin  1,000 mcg Oral Daily  . zolpidem  5 mg Oral QHS   Continuous Infusions: PRN Meds:.sodium chloride, acetaminophen, magnesium oxide, potassium chloride, sodium chloride flush   Assessment/Plan:  1.  Symptomatic paroxysmal atrial fibrillation. CHA2DS2-VASc Score is 2 with yearly risk of stroke of 2.2%.  2.  Coronary artery disease of native vessel without recurrence of angina pectoris 3.  Hypertension  Recommendation: Patient has receivd HCTZ.  This has been held since yesterday and patient did receive Tikosyn as per minute instructions.  I will hold off any further Tikosyn, we will recheck his EKG in the morning and if prolonged QT still persists, he would not be an ideal candidate for  Tikosyn.  I'll make recommendations tomorrow. Increase avapro to 300 mg due to hypertension.   [name], M.D. 09/08/2016, 1:13 PM [facility] [place] Pager: [phone] Office: [phone] If no answer: [phone]

## 2016-09-08 NOTE — Progress Notes (Signed)
MD paged aware of QTc >500. Orders received to hold further Tikosyn and prepare pt for discharge. Will continue to monitor pt closely.

## 2016-09-08 NOTE — Progress Notes (Signed)
MD made aware of QTc >500. Orders received to give 250 mcg dose Tikosyn now. Will reevaluate subsequent doses with MD after post dose EKG. Will continue to monitor pt closely.

## 2016-09-09 ENCOUNTER — Encounter (HOSPITAL_COMMUNITY)
Admission: RE | Disposition: A | Discharge status: Home / Self Care | Payer: Self-pay | Source: Ambulatory Visit | Attending: Cardiology

## 2016-09-09 LAB — BASIC METABOLIC PANEL
ANION GAP: 8 (ref 5–15)
BUN: 10 mg/dL (ref 6–20)
CHLORIDE: 104 mmol/L (ref 101–111)
CO2: 27 mmol/L (ref 22–32)
Calcium: 8.6 mg/dL — ABNORMAL LOW (ref 8.9–10.3)
Creatinine, Ser: 0.76 mg/dL (ref 0.61–1.24)
GFR calc non Af Amer: >60 mL/min (ref >60)
Glucose, Bld: 99 mg/dL (ref 65–99)
POTASSIUM: 3.9 mmol/L (ref 3.5–5.1)
Sodium: 139 mmol/L (ref 135–145)

## 2016-09-09 LAB — MAGNESIUM: Magnesium: 2.4 mg/dL (ref 1.7–2.4)

## 2016-09-09 SURGERY — CARDIOVERSION
Anesthesia: Monitor Anesthesia Care

## 2016-09-09 MED ORDER — BISOPROLOL FUMARATE 10 MG PO TABS: 20.0000 mg | ORAL_TABLET | Freq: Every day | ORAL | 1 refills | Status: DC

## 2016-09-09 MED ORDER — SODIUM CHLORIDE 0.9 % IV SOLN: 250.0000 mL | INTRAVENOUS | Status: DC

## 2016-09-09 MED ORDER — SODIUM CHLORIDE 0.9% FLUSH: 3.0000 mL | Freq: Two times a day (BID) | INTRAVENOUS | Status: DC

## 2016-09-09 MED ORDER — SODIUM CHLORIDE 0.9% FLUSH: 3.0000 mL | INTRAVENOUS | Status: DC | PRN

## 2016-09-09 NOTE — Progress Notes (Signed)
Discharge instructions given. Pt verbalized understanding and all questions were answered.  

## 2016-09-09 NOTE — Discharge Summary (Signed)
Physician Discharge Summary  Patient ID: Danny Kelley MRN: [id number] DOB/AGE: July 10, 1966 50 y.o.  Admit date: 09/07/2016 Discharge date: 09/09/2016   Primary Discharge Diagnosis 1. Symptomatic paroxysmal atrial fibrillation with rapid ventricular response CHA2DS2-VASc Score is 2 with yearly risk of stroke of 2.2%. 2. Coronary artery disease of the native vessel  Secondary Discharge Diagnosis 1. Hypertension 2. Hyperlipidemia  Prior procedures: Echocardiogram 08/07/2016: Left ventricle cavity is mildly dilated at 5.6 cm. Moderate concentric hypertrophy of the left ventricle. Normal global wall motion. Normal diastolic filling pattern. Calculated EF 56%. Left atrial cavity is moderately dilated at 4.7 cm. Trace tricuspid regurgitation. Unable to estimate PA pressure due to absence/minimal TR signal..  Lexiscan sestamibi 01/08/2016: Normal sinus rhythm, nondiagnostic stress EKG, fixed apical defect insistent with scar, apical hypokinesis. LVEF 36%. No evidence of ischemia.  Coronary angiogram 05/30/2013: 3.5 x 18 mm Xience DES to dominant distal left circumflex, 2.5 x 12 mm Xience to OM1.   Hospital Course: Patient was admitted to the hospital for therapeutic drug monitoring for Tikosyn. Previously he had failed sotalol due to prolonged QT interval. He was on multac, but had recurrence of atrial fibrillation. Patient developed prolonged QT with third dose administration of Tikosyn. Hence Tikosyn dose was held and watch for additional 24 hours. The QT interval did not come back to baseline. Hence it was felt that it is not safe and patient not appropriate candidate for Tikosyn.  He'll be restarted back on multaq in 24-48 hours, will be referred to electrophysiology for consideration for atrial fibrillation ablation. The dosage for bisoprolol was increased from 10 mg to 20 mg due to uncontrolled hypertension.  Discharge Exam: Blood pressure (!) 158/92, pulse (!) 58, temperature 97.8 F  (36.6 C), temperature source Oral, resp. rate 18, height 5\' 10"  (1.778 m), weight 112.2 kg (247 lb 5.7 oz), SpO2 98 %. General appearance: alert, cooperative, appears older than stated age, no distress and moderately obese Lungs: clear to auscultation bilaterally Heart: regular rate and rhythm, S1, S2 normal, no murmur, click, rub or gallop Abdomen: soft, non-tender; bowel sounds normal; no masses, no organomegaly Extremities: extremities normal, atraumatic, no cyanosis or edema Pulses: 2+ and symmetric Labs:   Lab Results  Component Value Date   WBC 11.5 (H) 07/15/2016   HGB 16.4 07/15/2016   HCT 47.6 07/15/2016   MCV 84.5 07/15/2016   PLT 229 07/15/2016    Recent Labs Lab 09/09/16 0344  NA 139  K 3.9  CL 104  CO2 27  BUN 10  CREATININE 0.76  CALCIUM 8.6*  GLUCOSE 99   EKG 09/09/2016: Normal sinus rhythm at rate of 60 beats a minute, prolonged QT interval at 530 ms.  FOLLOW UP PLANS AND APPOINTMENTS    Medication List    STOP taking these medications   Magnesium 500 MG Tabs     TAKE these medications   acetaminophen 500 MG tablet Commonly known as:  TYLENOL Take 1,000 mg by mouth every 6 (six) hours as needed for headache (pain).   b complex vitamins tablet Take 1 tablet by mouth daily.   bisoprolol 10 MG tablet Commonly known as:  ZEBETA Take 2 tablets (20 mg total) by mouth daily. What changed:  how much to take   cholecalciferol 1000 units tablet Commonly known as:  VITAMIN D Take 1,000 Units by mouth daily.   ELIQUIS 5 MG Tabs tablet Generic drug:  apixaban Take 5 mg by mouth 2 (two) times daily.   ezetimibe 10 MG tablet  Commonly known as:  ZETIA Take 10 mg by mouth daily.   fexofenadine 180 MG tablet Commonly known as:  ALLEGRA Take 180 mg by mouth daily as needed (seasonal allergies).   Fish Oil 1200 MG Caps Take 1,200 mg by mouth daily.   FLAX SEED OIL PO Take 2,400 mg by mouth daily.   multivitamin with minerals Tabs tablet Take 1  tablet by mouth daily. Centrum   phenylephrine-shark liver oil-mineral oil-petrolatum 0.25-3-14-71.9 % rectal ointment Commonly known as:  PREPARATION H Place 1 application rectally 2 (two) times daily as needed for hemorrhoids.   rosuvastatin 20 MG tablet Commonly known as:  CRESTOR Take 10 mg by mouth daily.   shark liver oil-cocoa butter 0.25-3-85.5 % suppository Commonly known as:  PREPARATION H Place 1 suppository rectally 2 (two) times daily as needed for hemorrhoids.   valsartan-hydrochlorothiazide 160-12.5 MG tablet Commonly known as:  DIOVAN HCT Take 1 tablet by mouth daily.   zolpidem 5 MG tablet Commonly known as:  AMBIEN Take 5 mg by mouth at bedtime.       [name], MD 09/09/2016, 1:29 PM  Pager: [phone] Office: [phone] If no answer: [phone]

## 2016-09-10 ENCOUNTER — Ambulatory Visit: Payer: MEDICAID | Admitting: Neurology

## 2016-09-29 ENCOUNTER — Ambulatory Visit (INDEPENDENT_AMBULATORY_CARE_PROVIDER_SITE_OTHER): Payer: BLUE CROSS/BLUE SHIELD | Admitting: Internal Medicine

## 2016-09-29 ENCOUNTER — Encounter: Payer: Self-pay | Admitting: Internal Medicine

## 2016-09-29 VITALS — BP 128/100 | HR 58 | Ht 70.5 in | Wt 249.4 lb

## 2016-09-29 DIAGNOSIS — I480 Paroxysmal atrial fibrillation: Secondary | ICD-10-CM

## 2016-09-29 DIAGNOSIS — I4819 Other persistent atrial fibrillation: Secondary | ICD-10-CM

## 2016-09-29 DIAGNOSIS — I481 Persistent atrial fibrillation: Secondary | ICD-10-CM | POA: Diagnosis not present

## 2016-09-29 DIAGNOSIS — G4733 Obstructive sleep apnea (adult) (pediatric): Secondary | ICD-10-CM

## 2016-09-29 NOTE — Patient Instructions (Signed)
Medication Instructions:  Your physician recommends that you continue on your current medications as directed. Please refer to the Current Medication list given to you today.    Labwork: Your physician recommends that you return for lab work on 10/29/16 at 11:00am---You do not have to fast   Testing/Procedures: Your physician has requested that you have cardiac CT. Cardiac computed tomography (CT) is a painless test that uses an x-ray machine to take clear, detailed pictures of your heart. For further information please visit [email or web address]. Please follow instruction sheet as given----week of 11/04/16---office will call with time and instructions  Your physician has recommended that you have an ablation. Catheter ablation is a medical procedure used to treat some cardiac arrhythmias (irregular heartbeats). During catheter ablation, a long, thin, flexible tube is put into a blood vessel in your groin (upper thigh), or neck. This tube is called an ablation catheter. It is then guided to your heart through the blood vessel. Radio frequency waves destroy small areas of heart tissue where abnormal heartbeats may cause an arrhythmia to start. Please see the instruction sheet given to you today.---11/13/16  Please arrive at [facility] [place] not eat or drink after midnight the night prior to the procedure Do not take any medications the morning of the procedure Plan for one night stay and you will need someone to drive you home at discharge  Follow-Up: Your physician recommends that you schedule a follow-up appointment in: 4 weeks from 11/13/16 in afib clinic and 3 months from 11/13/16 with Dr Rayann Heman

## 2016-09-29 NOTE — Progress Notes (Signed)
Electrophysiology Office Note   Date:  09/29/2016   ID:  Danny Kelley, DOB [birth year], MRN [id number]  PCP:  none Cardiologist:  Dr Einar Gip Primary Electrophysiologist: Danny Grayer, MD    Chief Complaint  Patient presents with  . Atrial Fibrillation     History of Present Illness: Danny Kelley is a 50 y.o. male who presents today for electrophysiology evaluation.   The patient reports inially being diagnosed with atrial fibrillation June 2017 after presenting with symptoms of shortness of breath, orthopnea, edema, and decreased exercise tolerance.  He presented to the ER in [place] and was diagnosed with URI.  His symptoms worsened and he presented to urgent care where he was found to have afib.  He went to [facility] and was initiated on medical therapy.  He went to Dr Einar Gip for further evaluation.  He was placed on eliquis and underwent cardioversion 05/27/2016.  He felt well for about a week before returning to afib.  He was admitted for sotalol and underwent repeat cardioversion.  He did well initially.  He developed presyncope and was found to have prolonged QT.  Sotalol was discontinued.  He was tried on [place] but could not continue this due to qt prolongation.  He was placed on multaq.  He has had intermittent afib since that time.  He feels "much better" in normal rhythm.  He is unaware of triggers for afib. Today, he denies symptoms of chest pain, shortness of breath, orthopnea, PND, lower extremity edema, claudication, dizziness, syncope, bleeding, or neurologic sequela. The patient is tolerating medications without difficulties and is otherwise without complaint today.    Past Medical History:  Diagnosis Date  . Arthritis    "a little in my right knee" (07/14/2016)  . CAD (coronary artery disease)   . Chronic congestive heart failure with left ventricular diastolic dysfunction ([place])   . Complication of anesthesia    Pt reports "they have a hard time waking me up"    . GERD (gastroesophageal reflux disease)    hx  . Headache    "with every heart issue that I have" (07/14/2016)  . High cholesterol   . History of hiatal hernia 1990s   "fixed it w/scope down my throat"  . Hypertension   . Myocardial infarction 05/2013   stents: left PDA, 1st OM at [facility]  . Obesity   . Obstructive sleep apnea    previous test "inconclusive" per patient,  has appointment with [facility] 10/06/16 for evaluation  . Persistent atrial fibrillation ([place])   . Pneumonia ~ 1992  . Seasonal allergies    "I take Allegra prn" (07/14/2016)   Past Surgical History:  Procedure Laterality Date  . APPENDECTOMY  1984  . CARDIOVERSION N/A 05/27/2016   Procedure: CARDIOVERSION;  Surgeon: [name], MD;  Location: [facility] ENDOSCOPY;  Service: Cardiovascular;  Laterality: N/A;  . CARDIOVERSION N/A 06/11/2016   Procedure: CARDIOVERSION;  Surgeon: [name], MD;  Location: [place];  Service: Cardiovascular;  Laterality: N/A;  . CORONARY ANGIOPLASTY WITH STENT PLACEMENT  05/30/2013   "2 stents put in at [facility]" & /stent card  . ESOPHAGOGASTRODUODENOSCOPY (EGD) WITH ESOPHAGEAL DILATION  1990s X 2  . KNEE ARTHROSCOPY Right 1986; ~ 1995 X 2  . LAPAROSCOPIC CHOLECYSTECTOMY  2015  . REPAIR OF ESOPHAGUS  1998   perforation of the distal esophagus from bad heartburn     Current Outpatient Prescriptions  Medication Sig Dispense Refill  . acetaminophen (TYLENOL) 500 MG  tablet Take 1,000 mg by mouth every 6 (six) hours as needed for headache (pain).    Marland Kitchen apixaban (ELIQUIS) 5 MG TABS tablet Take 5 mg by mouth 2 (two) times daily.    Marland Kitchen b complex vitamins tablet Take 1 tablet by mouth daily.    . bisoprolol (ZEBETA) 10 MG tablet Take 2 tablets (20 mg total) by mouth daily. 60 tablet 1  . cholecalciferol (VITAMIN D) 1000 units tablet Take 1,000 Units by mouth daily.    Marland Kitchen dronedarone (MULTAQ) 400 MG tablet Take 400 mg by mouth 2 (two) times daily with a meal.    . ezetimibe  (ZETIA) 10 MG tablet Take 10 mg by mouth daily.    . fexofenadine (ALLEGRA) 180 MG tablet Take 180 mg by mouth daily as needed (seasonal allergies).     . Flaxseed, Linseed, (FLAX SEED OIL PO) Take 2,400 mg by mouth daily.    . Multiple Vitamin (MULTIVITAMIN WITH MINERALS) TABS tablet Take 1 tablet by mouth daily. Centrum    . Omega-3 Fatty Acids (FISH OIL) 1200 MG CAPS Take 1,200 mg by mouth daily.    . phenylephrine-shark liver oil-mineral oil-petrolatum (PREPARATION H) 0.25-3-14-71.9 % rectal ointment Place 1 application rectally 2 (two) times daily as needed for hemorrhoids.    . shark liver oil-cocoa butter (PREPARATION H) 0.25-3-85.5 % suppository Place 1 suppository rectally 2 (two) times daily as needed for hemorrhoids.    . valsartan-hydrochlorothiazide (DIOVAN HCT) 160-12.5 MG tablet Take 1 tablet by mouth daily. 30 tablet 1  . zolpidem (AMBIEN) 5 MG tablet Take 5 mg by mouth at bedtime.     No current facility-administered medications for this visit.     Allergies:   Crestor [rosuvastatin]; Erythromycin; and Percocet [oxycodone-acetaminophen]   Social History:  The patient  reports that he has been smoking Cigarettes and E-cigarettes.  He has a 25.00 pack-year smoking history. He has never used smokeless tobacco. He reports that he does not drink alcohol or use drugs.   Family History:  The patient's family history includes Breast cancer in his mother; Congestive Heart Failure in his father; Diabetes in his father and mother; Heart attack (age of onset: 27) in his father; Heart disease (age of onset: 75) in his mother; Lung cancer in his father and paternal uncle; Tongue cancer in his mother.    ROS:  Please see the history of present illness.   All other systems are reviewed and negative.    PHYSICAL EXAM: VS:  BP (!) 128/100   Pulse (!) 58   Ht 5' 10.5" (1.791 m)   Wt 249 lb 6.4 oz (113.1 kg)   BMI 35.28 kg/m  , BMI Body mass index is 35.28 kg/m. GEN: overweight, in no  acute distress  HEENT: normal  Neck: no JVD, carotid bruits, or masses Cardiac: RRR; no murmurs, rubs, or gallops,no edema  Respiratory:  clear to auscultation bilaterally, normal work of breathing GI: soft, nontender, nondistended, + BS MS: no deformity or atrophy  Skin: warm and dry  Neuro:  Strength and sensation are intact Psych: euthymic mood, full affect  EKG:  EKG is ordered today. The ekg ordered today shows sinus bradycardia 58 bpm, PR 158 msec, QRS 100 msec, Qtc 435 msec, nonspecific St/T changes  Recent Labs: 07/15/2016: Hemoglobin 16.4; Platelets 229 09/09/2016: BUN 10; Creatinine, Ser 0.76; Magnesium 2.4; Potassium 3.9; Sodium 139    Lipid Panel  No results found for: CHOL, TRIG, HDL, CHOLHDL, VLDL, LDLCALC, LDLDIRECT   Wt  Readings from Last 3 Encounters:  09/29/16 249 lb 6.4 oz (113.1 kg)  09/07/16 247 lb 5.7 oz (112.2 kg)  07/15/16 240 lb 8 oz (109.1 kg)      Other studies Reviewed: Additional studies/ records that were reviewed today include: Dr Irven Shelling records (12 pages), echo 08/07/2016 Review of the above records today demonstrates: EF 56%, LVEDD 56 msec, LA 4.7 cm, trace MR, trace TR   ASSESSMENT AND PLAN:  1.  Persistent atrial fibrillation The patient has symptomatic atrial fibrillation.  He has required cardioversion twice frequently.  He has failed medical therapy with sotalol, tikosyn, and multaq.  He has GI symptoms with multaq. Therapeutic strategies for afib including medicine and ablation were discussed in detail with the patient today. Risk, benefits, and alternatives to EP study and radiofrequency ablation for afib were also discussed in detail today. These risks include but are not limited to stroke, bleeding, vascular damage, tamponade, perforation, damage to the esophagus, lungs, and other structures, pulmonary vein stenosis, worsening renal function, and death.  He is aware that with prior esophageal surgery that risks to esophagus may be  increased. The patient understands these risk and wishes to proceed.  We will therefore proceed with catheter ablation at the next available time. Will obtain cardiac CT prior to ablation to evaluate for LAA thrombus and to evaluate his CAD further.   He has had some tachypalpitations since childhood.  EPS will therefore be performed at time of ablation.  2. ? OSA Importance of evaluation and treatment were discussed today He has a scheduled appointment ordered with neurology  3. CAD Cardiac CT to evaluate prior to ablation  4. Overweight I have reviewed the patients BMI and decreased success rates with ablation at length today.  Weight loss is strongly advised.  Per Guijian et al (PACE 2013; [id number]), patients with BMI 25-29.9 (obese) have a 27% increase in AF recurrence post ablation.  Patients with BMI >30 have a 31% increase in AF recurrence post ablation when compared to those with BMI <25.  5. HTN Stable No change required today   Signed, Danny Grayer, MD  09/29/2016   [place] [address] [phone] (office) [phone] (fax)

## 2016-10-06 ENCOUNTER — Telehealth: Payer: Self-pay | Admitting: *Deleted

## 2016-10-06 ENCOUNTER — Ambulatory Visit: Payer: MEDICAID | Admitting: Neurology

## 2016-10-06 NOTE — Telephone Encounter (Signed)
no showed new patient appt 

## 2016-10-09 ENCOUNTER — Encounter: Payer: Self-pay | Admitting: Neurology

## 2016-10-20 ENCOUNTER — Telehealth: Payer: Self-pay | Admitting: Internal Medicine

## 2016-10-20 NOTE — Telephone Encounter (Signed)
LVMOM to call and schedule Cardiac Ct.

## 2016-10-22 ENCOUNTER — Encounter: Payer: Self-pay | Admitting: Internal Medicine

## 2016-10-29 ENCOUNTER — Other Ambulatory Visit: Payer: BLUE CROSS/BLUE SHIELD | Admitting: *Deleted

## 2016-10-29 ENCOUNTER — Telehealth: Payer: Self-pay | Admitting: Neurology

## 2016-10-29 DIAGNOSIS — I480 Paroxysmal atrial fibrillation: Secondary | ICD-10-CM

## 2016-10-29 LAB — CBC WITH DIFFERENTIAL/PLATELET
BASOS PCT: 0 %
Basophils Absolute: 0 cells/uL (ref 0–200)
Eosinophils Absolute: 214 cells/uL (ref 15–500)
Eosinophils Relative: 2 %
HCT: 47.4 % (ref 38.5–50.0)
Hemoglobin: 16.1 g/dL (ref 13.2–17.1)
LYMPHS PCT: 22 %
Lymphs Abs: 2354 cells/uL (ref 850–3900)
MCH: 29.4 pg (ref 27.0–33.0)
MCHC: 34 g/dL (ref 32.0–36.0)
MCV: 86.5 fL (ref 80.0–100.0)
MONO ABS: 856 {cells}/uL (ref 200–950)
MONOS PCT: 8 %
MPV: 11 fL (ref 7.5–12.5)
Neutro Abs: 7276 cells/uL (ref 1500–7800)
Neutrophils Relative %: 68 %
PLATELETS: 253 10*3/uL (ref 140–400)
RBC: 5.48 MIL/uL (ref 4.20–5.80)
RDW: 13.3 % (ref 11.0–15.0)
WBC: 10.7 10*3/uL (ref 3.8–10.8)

## 2016-10-29 LAB — BASIC METABOLIC PANEL
BUN: 13 mg/dL (ref 7–25)
CO2: 28 mmol/L (ref 20–31)
CREATININE: 0.79 mg/dL (ref 0.60–1.35)
Calcium: 8.7 mg/dL (ref 8.6–10.3)
Chloride: 105 mmol/L (ref 98–110)
Glucose, Bld: 137 mg/dL — ABNORMAL HIGH (ref 65–99)
POTASSIUM: 3.9 mmol/L (ref 3.5–5.3)
Sodium: 139 mmol/L (ref 135–146)

## 2016-10-29 NOTE — Telephone Encounter (Signed)
Pt called to r/s appt. Operator advised him he had 2 no show and 2 c/a appointment and could not r/s. Advised him to call referring provider to schedule an appt at another practice. He understood the  Policy   FYI

## 2016-11-10 ENCOUNTER — Ambulatory Visit (HOSPITAL_COMMUNITY)
Admission: RE | Admit: 2016-11-10 | Discharge: 2016-11-10 | Disposition: A | Discharge status: Home / Self Care | Payer: BLUE CROSS/BLUE SHIELD | Source: Ambulatory Visit | Attending: Internal Medicine | Admitting: Internal Medicine

## 2016-11-10 ENCOUNTER — Encounter (HOSPITAL_COMMUNITY): Payer: Self-pay

## 2016-11-10 DIAGNOSIS — I4891 Unspecified atrial fibrillation: Secondary | ICD-10-CM | POA: Diagnosis not present

## 2016-11-10 DIAGNOSIS — I480 Paroxysmal atrial fibrillation: Secondary | ICD-10-CM | POA: Diagnosis present

## 2016-11-10 DIAGNOSIS — I2510 Atherosclerotic heart disease of native coronary artery without angina pectoris: Secondary | ICD-10-CM | POA: Insufficient documentation

## 2016-11-10 MED ORDER — METOPROLOL TARTRATE 5 MG/5ML IV SOLN
5.0000 mg | INTRAVENOUS | Status: DC | PRN
  Administered 2016-11-10: 5 mg via INTRAVENOUS

## 2016-11-10 MED ORDER — NITROGLYCERIN 0.4 MG SL SUBL
SUBLINGUAL_TABLET | SUBLINGUAL | Status: AC
  Filled 2016-11-10: qty 1

## 2016-11-10 MED ORDER — METOPROLOL TARTRATE 5 MG/5ML IV SOLN
INTRAVENOUS | Status: AC
  Filled 2016-11-10: qty 10

## 2016-11-10 MED ORDER — NITROGLYCERIN 0.4 MG SL SUBL
0.4000 mg | SUBLINGUAL_TABLET | Freq: Once | SUBLINGUAL | Status: AC
Start: 2016-11-10 — End: 2016-11-10
  Administered 2016-11-10: 0.4 mg via SUBLINGUAL

## 2016-11-10 MED ORDER — IOPAMIDOL (ISOVUE-370) INJECTION 76%
INTRAVENOUS | Status: AC
  Administered 2016-11-10: 100 mL
  Filled 2016-11-10: qty 100

## 2016-11-12 ENCOUNTER — Other Ambulatory Visit: Payer: Self-pay | Admitting: Nurse Practitioner

## 2016-11-13 ENCOUNTER — Encounter (HOSPITAL_COMMUNITY): Payer: Self-pay

## 2016-11-13 ENCOUNTER — Encounter (HOSPITAL_COMMUNITY)
Admission: RE | Disposition: A | Discharge status: Home / Self Care | Payer: Self-pay | Source: Ambulatory Visit | Attending: Internal Medicine

## 2016-11-13 ENCOUNTER — Ambulatory Visit (HOSPITAL_BASED_OUTPATIENT_CLINIC_OR_DEPARTMENT_OTHER): Payer: BLUE CROSS/BLUE SHIELD

## 2016-11-13 ENCOUNTER — Ambulatory Visit (HOSPITAL_COMMUNITY): Payer: BLUE CROSS/BLUE SHIELD | Admitting: Anesthesiology

## 2016-11-13 ENCOUNTER — Ambulatory Visit (HOSPITAL_COMMUNITY)
Admission: RE | Admit: 2016-11-13 | Discharge: 2016-11-14 | Disposition: A | Discharge status: Home / Self Care | Payer: BLUE CROSS/BLUE SHIELD | Source: Ambulatory Visit | Attending: Internal Medicine | Admitting: Internal Medicine

## 2016-11-13 DIAGNOSIS — Z803 Family history of malignant neoplasm of breast: Secondary | ICD-10-CM | POA: Insufficient documentation

## 2016-11-13 DIAGNOSIS — I2510 Atherosclerotic heart disease of native coronary artery without angina pectoris: Secondary | ICD-10-CM | POA: Insufficient documentation

## 2016-11-13 DIAGNOSIS — Z801 Family history of malignant neoplasm of trachea, bronchus and lung: Secondary | ICD-10-CM | POA: Insufficient documentation

## 2016-11-13 DIAGNOSIS — E669 Obesity, unspecified: Secondary | ICD-10-CM | POA: Insufficient documentation

## 2016-11-13 DIAGNOSIS — I4891 Unspecified atrial fibrillation: Secondary | ICD-10-CM | POA: Diagnosis present

## 2016-11-13 DIAGNOSIS — Z6835 Body mass index (BMI) 35.0-35.9, adult: Secondary | ICD-10-CM | POA: Insufficient documentation

## 2016-11-13 DIAGNOSIS — G4733 Obstructive sleep apnea (adult) (pediatric): Secondary | ICD-10-CM | POA: Insufficient documentation

## 2016-11-13 DIAGNOSIS — I5032 Chronic diastolic (congestive) heart failure: Secondary | ICD-10-CM | POA: Insufficient documentation

## 2016-11-13 DIAGNOSIS — Z885 Allergy status to narcotic agent status: Secondary | ICD-10-CM | POA: Diagnosis not present

## 2016-11-13 DIAGNOSIS — F17210 Nicotine dependence, cigarettes, uncomplicated: Secondary | ICD-10-CM | POA: Insufficient documentation

## 2016-11-13 DIAGNOSIS — I481 Persistent atrial fibrillation: Secondary | ICD-10-CM | POA: Diagnosis not present

## 2016-11-13 DIAGNOSIS — E7800 Pure hypercholesterolemia, unspecified: Secondary | ICD-10-CM | POA: Diagnosis not present

## 2016-11-13 DIAGNOSIS — Z833 Family history of diabetes mellitus: Secondary | ICD-10-CM | POA: Insufficient documentation

## 2016-11-13 DIAGNOSIS — K449 Diaphragmatic hernia without obstruction or gangrene: Secondary | ICD-10-CM | POA: Insufficient documentation

## 2016-11-13 DIAGNOSIS — K219 Gastro-esophageal reflux disease without esophagitis: Secondary | ICD-10-CM | POA: Diagnosis not present

## 2016-11-13 DIAGNOSIS — Z8249 Family history of ischemic heart disease and other diseases of the circulatory system: Secondary | ICD-10-CM | POA: Insufficient documentation

## 2016-11-13 DIAGNOSIS — Z7901 Long term (current) use of anticoagulants: Secondary | ICD-10-CM | POA: Diagnosis not present

## 2016-11-13 DIAGNOSIS — M1990 Unspecified osteoarthritis, unspecified site: Secondary | ICD-10-CM | POA: Insufficient documentation

## 2016-11-13 DIAGNOSIS — I110 Hypertensive heart disease with heart failure: Secondary | ICD-10-CM | POA: Insufficient documentation

## 2016-11-13 DIAGNOSIS — I252 Old myocardial infarction: Secondary | ICD-10-CM | POA: Insufficient documentation

## 2016-11-13 DIAGNOSIS — I4819 Other persistent atrial fibrillation: Secondary | ICD-10-CM | POA: Diagnosis present

## 2016-11-13 HISTORY — PX: TEE WITHOUT CARDIOVERSION: SHX5443

## 2016-11-13 HISTORY — PX: ELECTROPHYSIOLOGIC STUDY: SHX172A

## 2016-11-13 LAB — POCT ACTIVATED CLOTTING TIME
ACTIVATED CLOTTING TIME: 285 s
ACTIVATED CLOTTING TIME: 296 s
ACTIVATED CLOTTING TIME: 180 s
ACTIVATED CLOTTING TIME: 191 s
Activated Clotting Time: 312 seconds

## 2016-11-13 LAB — MRSA PCR SCREENING: MRSA by PCR: NEGATIVE

## 2016-11-13 SURGERY — ATRIAL FIBRILLATION ABLATION
Anesthesia: General

## 2016-11-13 SURGERY — ECHOCARDIOGRAM, TRANSESOPHAGEAL
Anesthesia: Moderate Sedation

## 2016-11-13 MED ORDER — FENTANYL CITRATE (PF) 100 MCG/2ML IJ SOLN
INTRAMUSCULAR | Status: AC
  Filled 2016-11-13: qty 2

## 2016-11-13 MED ORDER — MIDAZOLAM HCL 5 MG/ML IJ SOLN
INTRAMUSCULAR | Status: AC
  Filled 2016-11-13: qty 2

## 2016-11-13 MED ORDER — PHENYLEPHRINE HCL 10 MG/ML IJ SOLN
INTRAMUSCULAR | Status: DC | PRN
  Administered 2016-11-13 (×3): 80 ug via INTRAVENOUS
  Administered 2016-11-13: 40 ug via INTRAVENOUS
  Administered 2016-11-13: 80 ug via INTRAVENOUS

## 2016-11-13 MED ORDER — ROCURONIUM BROMIDE 100 MG/10ML IV SOLN
INTRAVENOUS | Status: DC | PRN
  Administered 2016-11-13: 10 mg via INTRAVENOUS
  Administered 2016-11-13: 50 mg via INTRAVENOUS
  Administered 2016-11-13: 10 mg via INTRAVENOUS

## 2016-11-13 MED ORDER — IOPAMIDOL (ISOVUE-370) INJECTION 76%
INTRAVENOUS | Status: AC
  Filled 2016-11-13: qty 50

## 2016-11-13 MED ORDER — PHENYLEPHRINE HCL 10 MG/ML IJ SOLN
INTRAVENOUS | Status: DC | PRN
  Administered 2016-11-13: 25 ug/min via INTRAVENOUS

## 2016-11-13 MED ORDER — ZOLPIDEM TARTRATE 5 MG PO TABS
5.0000 mg | ORAL_TABLET | Freq: Every day | ORAL | Status: DC
  Administered 2016-11-13: 5 mg via ORAL
  Filled 2016-11-13: qty 1

## 2016-11-13 MED ORDER — BISOPROLOL FUMARATE 5 MG PO TABS
20.0000 mg | ORAL_TABLET | Freq: Every day | ORAL | Status: DC
  Administered 2016-11-14: 20 mg via ORAL
  Filled 2016-11-13: qty 4

## 2016-11-13 MED ORDER — HEPARIN SODIUM (PORCINE) 1000 UNIT/ML IJ SOLN
INTRAMUSCULAR | Status: DC | PRN
  Administered 2016-11-13: 3000 [IU] via INTRAVENOUS
  Administered 2016-11-13: 14000 [IU] via INTRAVENOUS
  Administered 2016-11-13: 4000 [IU] via INTRAVENOUS
  Administered 2016-11-13: 2000 [IU] via INTRAVENOUS

## 2016-11-13 MED ORDER — IOPAMIDOL (ISOVUE-370) INJECTION 76%
INTRAVENOUS | Status: DC | PRN
  Administered 2016-11-13: 4 mL via INTRAVENOUS

## 2016-11-13 MED ORDER — IRBESARTAN 150 MG PO TABS
150.0000 mg | ORAL_TABLET | Freq: Every day | ORAL | Status: DC
  Administered 2016-11-13 – 2016-11-14 (×2): 150 mg via ORAL
  Filled 2016-11-13 (×2): qty 1

## 2016-11-13 MED ORDER — HYDROCHLOROTHIAZIDE 12.5 MG PO CAPS
12.5000 mg | ORAL_CAPSULE | Freq: Every day | ORAL | Status: DC
  Administered 2016-11-13 – 2016-11-14 (×2): 12.5 mg via ORAL
  Filled 2016-11-13 (×2): qty 1

## 2016-11-13 MED ORDER — SODIUM CHLORIDE 0.9 % IV SOLN: INTRAVENOUS | Status: DC

## 2016-11-13 MED ORDER — FENTANYL CITRATE (PF) 100 MCG/2ML IJ SOLN
INTRAMUSCULAR | Status: DC | PRN
  Administered 2016-11-13 (×2): 50 ug via INTRAVENOUS
  Administered 2016-11-13: 100 ug via INTRAVENOUS

## 2016-11-13 MED ORDER — FENTANYL CITRATE (PF) 100 MCG/2ML IJ SOLN
INTRAMUSCULAR | Status: DC | PRN
Start: 2016-11-13 — End: 2016-11-13
  Administered 2016-11-13: 25 ug via INTRAVENOUS
  Administered 2016-11-13: 50 ug via INTRAVENOUS
  Administered 2016-11-13: 25 ug via INTRAVENOUS

## 2016-11-13 MED ORDER — VALSARTAN-HYDROCHLOROTHIAZIDE 160-12.5 MG PO TABS: 1.0000 | ORAL_TABLET | Freq: Every day | ORAL | Status: DC

## 2016-11-13 MED ORDER — HYDRALAZINE HCL 20 MG/ML IJ SOLN
INTRAMUSCULAR | Status: DC | PRN
  Administered 2016-11-13: 10 mg via INTRAVENOUS

## 2016-11-13 MED ORDER — MIDAZOLAM HCL 5 MG/5ML IJ SOLN
INTRAMUSCULAR | Status: DC | PRN
  Administered 2016-11-13: 2 mg via INTRAVENOUS

## 2016-11-13 MED ORDER — BUPIVACAINE HCL (PF) 0.25 % IJ SOLN
INTRAMUSCULAR | Status: DC | PRN
  Administered 2016-11-13: 30 mL

## 2016-11-13 MED ORDER — SODIUM CHLORIDE 0.9 % IV SOLN
INTRAVENOUS | Status: DC
  Administered 2016-11-13: 500 mL via INTRAVENOUS

## 2016-11-13 MED ORDER — BUTAMBEN-TETRACAINE-BENZOCAINE 2-2-14 % EX AERO
INHALATION_SPRAY | CUTANEOUS | Status: DC | PRN
  Administered 2016-11-13: 2 via TOPICAL

## 2016-11-13 MED ORDER — SUCCINYLCHOLINE CHLORIDE 20 MG/ML IJ SOLN
INTRAMUSCULAR | Status: DC | PRN
  Administered 2016-11-13: 140 mg via INTRAVENOUS

## 2016-11-13 MED ORDER — HEPARIN SODIUM (PORCINE) 1000 UNIT/ML IJ SOLN
INTRAMUSCULAR | Status: DC | PRN
  Administered 2016-11-13 (×2): 1000 [IU] via INTRAVENOUS

## 2016-11-13 MED ORDER — DEXAMETHASONE SODIUM PHOSPHATE 4 MG/ML IJ SOLN
INTRAMUSCULAR | Status: DC | PRN
  Administered 2016-11-13: 4 mg via INTRAVENOUS

## 2016-11-13 MED ORDER — BUPIVACAINE HCL (PF) 0.25 % IJ SOLN
INTRAMUSCULAR | Status: AC
  Filled 2016-11-13: qty 30

## 2016-11-13 MED ORDER — PROPOFOL 10 MG/ML IV BOLUS
INTRAVENOUS | Status: DC | PRN
  Administered 2016-11-13: 200 mg via INTRAVENOUS
  Administered 2016-11-13: 20 mg via INTRAVENOUS
  Administered 2016-11-13: 30 mg via INTRAVENOUS

## 2016-11-13 MED ORDER — PROTAMINE SULFATE 10 MG/ML IV SOLN
INTRAVENOUS | Status: DC | PRN
  Administered 2016-11-13 (×2): 10 mg via INTRAVENOUS

## 2016-11-13 MED ORDER — ISOPROTERENOL HCL 0.2 MG/ML IJ SOLN
INTRAVENOUS | Status: DC | PRN
  Administered 2016-11-13: 10 ug/min via INTRAVENOUS

## 2016-11-13 MED ORDER — SODIUM CHLORIDE 0.9 % IV SOLN: 250.0000 mL | INTRAVENOUS | Status: DC | PRN

## 2016-11-13 MED ORDER — SUGAMMADEX SODIUM 500 MG/5ML IV SOLN
INTRAVENOUS | Status: DC | PRN
  Administered 2016-11-13: 340 mg via INTRAVENOUS

## 2016-11-13 MED ORDER — SODIUM CHLORIDE 0.9% FLUSH: 3.0000 mL | INTRAVENOUS | Status: DC | PRN

## 2016-11-13 MED ORDER — ISOPROTERENOL HCL 0.2 MG/ML IJ SOLN
INTRAMUSCULAR | Status: AC
  Filled 2016-11-13: qty 5

## 2016-11-13 MED ORDER — SODIUM CHLORIDE 0.9% FLUSH
3.0000 mL | Freq: Two times a day (BID) | INTRAVENOUS | Status: DC
  Administered 2016-11-13 – 2016-11-14 (×2): 3 mL via INTRAVENOUS

## 2016-11-13 MED ORDER — SODIUM CHLORIDE 0.9 % IV SOLN
INTRAVENOUS | Status: DC | PRN
  Administered 2016-11-13 (×2): via INTRAVENOUS

## 2016-11-13 MED ORDER — ACETAMINOPHEN 325 MG PO TABS: 650.0000 mg | ORAL_TABLET | ORAL | Status: DC | PRN

## 2016-11-13 MED ORDER — ONDANSETRON HCL 4 MG/2ML IJ SOLN: 4.0000 mg | Freq: Four times a day (QID) | INTRAMUSCULAR | Status: DC | PRN

## 2016-11-13 MED ORDER — ONDANSETRON HCL 4 MG/2ML IJ SOLN
INTRAMUSCULAR | Status: DC | PRN
  Administered 2016-11-13: 4 mg via INTRAVENOUS

## 2016-11-13 MED ORDER — MIDAZOLAM HCL 10 MG/2ML IJ SOLN
INTRAMUSCULAR | Status: DC | PRN
  Administered 2016-11-13 (×2): 2 mg via INTRAVENOUS
  Administered 2016-11-13: 1 mg via INTRAVENOUS
  Administered 2016-11-13: 2 mg via INTRAVENOUS

## 2016-11-13 MED ORDER — APIXABAN 5 MG PO TABS
5.0000 mg | ORAL_TABLET | Freq: Two times a day (BID) | ORAL | Status: DC
  Administered 2016-11-13 – 2016-11-14 (×2): 5 mg via ORAL
  Filled 2016-11-13 (×2): qty 1

## 2016-11-13 MED ORDER — HEPARIN SODIUM (PORCINE) 1000 UNIT/ML IJ SOLN
INTRAMUSCULAR | Status: AC
  Filled 2016-11-13: qty 1

## 2016-11-13 SURGICAL SUPPLY — 18 items
BAG SNAP BAND KOVER 36X36 (MISCELLANEOUS) ×3 IMPLANT
BLANKET WARM UNDERBOD FULL ACC (MISCELLANEOUS) ×3 IMPLANT
CATH NAVISTAR SMARTTOUCH DF (ABLATOR) ×3 IMPLANT
CATH SOUNDSTAR 3D IMAGING (CATHETERS) ×3 IMPLANT
CATH VARIABLE LASSO NAV 2515 (CATHETERS) ×3 IMPLANT
CATH WEBSTER BI DIR CS D-F CRV (CATHETERS) ×3 IMPLANT
COVER SWIFTLINK CONNECTOR (BAG) ×3 IMPLANT
NEEDLE TRANSEP BRK 71CM 407200 (NEEDLE) ×3 IMPLANT
PACK EP LATEX FREE (CUSTOM PROCEDURE TRAY) ×2
PACK EP LF (CUSTOM PROCEDURE TRAY) ×1 IMPLANT
PAD DEFIB LIFELINK (PAD) ×3 IMPLANT
PATCH CARTO3 (PAD) ×3 IMPLANT
SHEATH AVANTI 11F 11CM (SHEATH) ×3 IMPLANT
SHEATH PINNACLE 7F 10CM (SHEATH) ×6 IMPLANT
SHEATH PINNACLE 9F 10CM (SHEATH) ×3 IMPLANT
SHEATH SWARTZ TS SL2 63CM 8.5F (SHEATH) ×3 IMPLANT
SHIELD RADPAD SCOOP 12X17 (MISCELLANEOUS) ×3 IMPLANT
TUBING SMART ABLATE COOLFLOW (TUBING) ×3 IMPLANT

## 2016-11-13 NOTE — H&P (Signed)
Electrophysiology Office Note   Date:  09/29/2016   ID:  Danny Kelley, DOB Oct 18, 1966, MRN [id number]  PCP:  none Cardiologist:  Dr Einar Gip Primary Electrophysiologist: [name], MD          Chief Complaint  Patient presents with  . Atrial Fibrillation     History of Present Illness: Danny Kelley is a 51 y.o. male who presents today for electrophysiology evaluation.   The patient reports inially being diagnosed with atrial fibrillation June 2017 after presenting with symptoms of shortness of breath, orthopnea, edema, and decreased exercise tolerance.  He presented to the ER in [place] and was diagnosed with URI.  His symptoms worsened and he presented to urgent care where he was found to have afib.  He went to [facility] and was initiated on medical therapy.  He went to Dr Einar Gip for further evaluation.  He was placed on eliquis and underwent cardioversion 05/27/2016.  He felt well for about a week before returning to afib.  He was admitted for sotalol and underwent repeat cardioversion.  He did well initially.  He developed presyncope and was found to have prolonged QT.  Sotalol was discontinued.  He was tried on [place] but could not continue this due to qt prolongation.  He was placed on multaq.  He has had intermittent afib since that time.  He feels "much better" in normal rhythm.  He is unaware of triggers for afib. Today, he denies symptoms of chest pain, shortness of breath, orthopnea, PND, lower extremity edema, claudication, dizziness, syncope, bleeding, or neurologic sequela. The patient is tolerating medications without difficulties and is otherwise without complaint today.        Past Medical History:  Diagnosis Date  . Arthritis    "a little in my right knee" (07/14/2016)  . CAD (coronary artery disease)   . Chronic congestive heart failure with left ventricular diastolic dysfunction ([place])   . Complication of anesthesia    Pt reports "they have  a hard time waking me up"  . GERD (gastroesophageal reflux disease)    hx  . Headache    "with every heart issue that I have" (07/14/2016)  . High cholesterol   . History of hiatal hernia 1990s   "fixed it w/scope down my throat"  . Hypertension   . Myocardial infarction 05/2013   stents: left PDA, 1st OM at [facility]  . Obesity   . Obstructive sleep apnea    previous test "inconclusive" per patient,  has appointment with [facility] 10/06/16 for evaluation  . Persistent atrial fibrillation ([place])   . Pneumonia ~ 1992  . Seasonal allergies    "I take Allegra prn" (07/14/2016)        Past Surgical History:  Procedure Laterality Date  . APPENDECTOMY  1984  . CARDIOVERSION N/A 05/27/2016   Procedure: CARDIOVERSION;  Surgeon: [name], MD;  Location: [facility] ENDOSCOPY;  Service: Cardiovascular;  Laterality: N/A;  . CARDIOVERSION N/A 06/11/2016   Procedure: CARDIOVERSION;  Surgeon: [name], MD;  Location: [place];  Service: Cardiovascular;  Laterality: N/A;  . CORONARY ANGIOPLASTY WITH STENT PLACEMENT  05/30/2013   "2 stents put in at [facility]" & /stent card  . ESOPHAGOGASTRODUODENOSCOPY (EGD) WITH ESOPHAGEAL DILATION  1990s X 2  . KNEE ARTHROSCOPY Right 1986; ~ 1995 X 2  . LAPAROSCOPIC CHOLECYSTECTOMY  2015  . REPAIR OF ESOPHAGUS  1998   perforation of the distal esophagus from bad heartburn  Current Outpatient Prescriptions  Medication Sig Dispense Refill  . acetaminophen (TYLENOL) 500 MG tablet Take 1,000 mg by mouth every 6 (six) hours as needed for headache (pain).    Marland Kitchen apixaban (ELIQUIS) 5 MG TABS tablet Take 5 mg by mouth 2 (two) times daily.    Marland Kitchen b complex vitamins tablet Take 1 tablet by mouth daily.    . bisoprolol (ZEBETA) 10 MG tablet Take 2 tablets (20 mg total) by mouth daily. 60 tablet 1  . cholecalciferol (VITAMIN D) 1000 units tablet Take 1,000 Units by mouth daily.    Marland Kitchen dronedarone (MULTAQ) 400 MG  tablet Take 400 mg by mouth 2 (two) times daily with a meal.    . ezetimibe (ZETIA) 10 MG tablet Take 10 mg by mouth daily.    . fexofenadine (ALLEGRA) 180 MG tablet Take 180 mg by mouth daily as needed (seasonal allergies).     . Flaxseed, Linseed, (FLAX SEED OIL PO) Take 2,400 mg by mouth daily.    . Multiple Vitamin (MULTIVITAMIN WITH MINERALS) TABS tablet Take 1 tablet by mouth daily. Centrum    . Omega-3 Fatty Acids (FISH OIL) 1200 MG CAPS Take 1,200 mg by mouth daily.    . phenylephrine-shark liver oil-mineral oil-petrolatum (PREPARATION H) 0.25-3-14-71.9 % rectal ointment Place 1 application rectally 2 (two) times daily as needed for hemorrhoids.    . shark liver oil-cocoa butter (PREPARATION H) 0.25-3-85.5 % suppository Place 1 suppository rectally 2 (two) times daily as needed for hemorrhoids.    . valsartan-hydrochlorothiazide (DIOVAN HCT) 160-12.5 MG tablet Take 1 tablet by mouth daily. 30 tablet 1  . zolpidem (AMBIEN) 5 MG tablet Take 5 mg by mouth at bedtime.     No current facility-administered medications for this visit.     Allergies:   Crestor [rosuvastatin]; Erythromycin; and Percocet [oxycodone-acetaminophen]   Social History:  The patient  reports that he has been smoking Cigarettes and E-cigarettes.  He has a 25.00 pack-year smoking history. He has never used smokeless tobacco. He reports that he does not drink alcohol or use drugs.   Family History:  The patient's family history includes Breast cancer in his mother; Congestive Heart Failure in his father; Diabetes in his father and mother; Heart attack (age of onset: 63) in his father; Heart disease (age of onset: 47) in his mother; Lung cancer in his father and paternal uncle; Tongue cancer in his mother.    ROS:  Please see the history of present illness.   All other systems are reviewed and negative.    PHYSICAL EXAM: VS:  BP (!) 128/100   Pulse (!) 58   Ht 5' 10.5" (1.791 m)   Wt 249 lb  6.4 oz (113.1 kg)   BMI 35.28 kg/m  , BMI Body mass index is 35.28 kg/m. GEN: overweight, in no acute distress  HEENT: normal  Neck: no JVD, carotid bruits, or masses Cardiac: RRR; no murmurs, rubs, or gallops,no edema  Respiratory:  clear to auscultation bilaterally, normal work of breathing GI: soft, nontender, nondistended, + BS MS: no deformity or atrophy  Skin: warm and dry  Neuro:  Strength and sensation are intact Psych: euthymic mood, full affect  EKG:  EKG is ordered today. The ekg ordered today Kelley sinus bradycardia 58 bpm, PR 158 msec, QRS 100 msec, Qtc 435 msec, nonspecific St/T changes  Recent Labs: 07/15/2016: Hemoglobin 16.4; Platelets 229 09/09/2016: BUN 10; Creatinine, Ser 0.76; Magnesium 2.4; Potassium 3.9; Sodium 139    Lipid Panel  Labs (Brief)  No results found for: CHOL, TRIG, HDL, CHOLHDL, VLDL, LDLCALC, LDLDIRECT        Wt Readings from Last 3 Encounters:  09/29/16 249 lb 6.4 oz (113.1 kg)  09/07/16 247 lb 5.7 oz (112.2 kg)  07/15/16 240 lb 8 oz (109.1 kg)      Other studies Reviewed: Additional studies/ records that were reviewed today include: Dr Irven Shelling records (12 pages), echo 08/07/2016 Review of the above records today demonstrates: EF 56%, LVEDD 56 msec, LA 4.7 cm, trace MR, trace TR   ASSESSMENT AND PLAN:  1.  Persistent atrial fibrillation The patient has symptomatic atrial fibrillation.  He has required cardioversion twice frequently.  He has failed medical therapy with sotalol, tikosyn, and multaq.  He has GI symptoms with multaq. Therapeutic strategies for afib including medicine and ablation were discussed in detail with the patient today. Risk, benefits, and alternatives to EP study and radiofrequency ablation for afib were also discussed in detail today. These risks include but are not limited to stroke, bleeding, vascular damage, tamponade, perforation, damage to the esophagus, lungs, and other structures, pulmonary vein  stenosis, worsening renal function, and death.  He is aware that with prior esophageal surgery that risks to esophagus may be increased. The patient understands these risk and wishes to proceed.  We will therefore proceed with catheter ablation at the next available time. Will obtain cardiac CT prior to ablation to evaluate for LAA thrombus and to evaluate his CAD further.   He has had some tachypalpitations since childhood.  EPS will therefore be performed at time of ablation.  2. ? OSA Importance of evaluation and treatment were discussed today He has a scheduled appointment ordered with neurology  3. CAD Cardiac CT to evaluate prior to ablation  4. Overweight I have reviewed the patients BMI and decreased success rates with ablation at length today.  Weight loss is strongly advised.  Per Guijian et al (PACE 2013; [id number]), patients with BMI 25-29.9 (obese) have a 27% increase in AF recurrence post ablation.  Patients with BMI >30 have a 31% increase in AF recurrence post ablation when compared to those with BMI <25.  5. HTN Stable No change required today   Signed, [name], MD  09/29/2016   [place] [address] [phone] (office) [phone] (fax)    Diagnoses    Codes Comments  PAF (paroxysmal atrial fibrillation) ([place]) - Primary ICD-9-CM: 427.31 ICD-10-CM: I48.0   Persistent atrial fibrillation ([place])  ICD-9-CM: 427.31 ICD-10-CM: I48.1   Obstructive sleep apnea  ICD-9-CM: 327.23 ICD-10-CM: G47.33        Reason for Visit   Atrial Fibrillation   Reason for Visit History    Discontinued Medications    Reason for Discontinue  rosuvastatin (CRESTOR) 20 MG tablet Side effect (s)  Level of Service   Level of Service  PR OFFICE CONSULTATION NEW/ESTAB PATIENT 80 MIN [99245]   Follow-up and Disposition   Routing History  Follow-up and Disposition History    All Charges for This Encounter     Code Description Service Date Service Provider Modifiers Qty  [phone] PR OFFICE CONSULTATION NEW/ESTAB PATIENT 80 MIN 09/29/2016 [name], MD  1  93000 PR ELECTROCARDIOGRAM, COMPLETE 09/29/2016 [name], MD  1  Routing History   From: [name], MD On: 09/29/2016 4:08 PM  To: [name], MD  Priority: Routine  AVS Reports   Date/Time Report Action User  09/29/2016 4:05 PM  After Visit Summary Printed Dionicio Stall, RN  Patient Instructions   Medication Instructions:  Your physician recommends that you continue on your current medications as directed. Please refer to the Current Medication list given to you today.    Labwork: Your physician recommends that you return for lab work on 10/29/16 at 11:00am---You do not have to fast   Testing/Procedures: Your physician has requested that you have cardiac CT. Cardiac computed tomography (CT) is a painless test that uses an x-ray machine to take clear, detailed pictures of your heart. For further information please visit [email or web address]. Please follow instruction sheet as given----week of 11/04/16---office will call with time and instructions  Your physician has recommended that you have an ablation. Catheter ablation is a medical procedure used to treat some cardiac arrhythmias (irregular heartbeats). During catheter ablation, a long, thin, flexible tube is put into a blood vessel in your groin (upper thigh), or neck. This tube is called an ablation catheter. It is then guided to your heart through the blood vessel. Radio frequency waves destroy small areas of heart tissue where abnormal heartbeats may cause an arrhythmia to start. Please see the instruction sheet given to you today.---11/13/16  Please arrive at [facility] [place] not eat or drink after midnight the night prior to the procedure Do not take any medications the morning of the procedure Plan for one night  stay and you will need someone to drive you home at discharge    The patient presents for TEE prior to ablation. I've discussed the case with Dr. Rayann Heman. He has had a CT with contrast but the atrial appendage tip was not well visualized. The patient has some esophageal issues but does not have swallowing issues currently .  Will proceed with TEE with A-fib ablation to follow     Mertie Moores, MD  11/13/2016 8:09 AM    [place],  [place], [place]  [address] Pager [phone] Phone: [phone]; Fax: [phone]

## 2016-11-13 NOTE — CV Procedure (Signed)
     Transesophageal Echocardiogram Note  NELL ECKERSON [id number] 02/08/1966  Procedure: Transesophageal Echocardiogram Indications: atrial fib  Procedure Details Consent: Obtained Time Out: Verified patient identification, verified procedure, site/side was marked, verified correct patient position, special equipment/implants available, Radiology Safety Procedures followed,  medications/allergies/relevent history reviewed, required imaging and test results available.  Performed  Medications:  During this procedure the patient is administered a total of Versed 7 mg and Fentanyl 100 mcg  to achieve and maintain moderate conscious sedation.  The patient's heart rate, blood pressure, and oxygen saturation are monitored continuously during the procedure. The period of conscious sedation is 30 minutes, of which I was present face-to-face 100% of this time.  Left Ventrical:  Normal LV function  Mitral Valve: normal   Aortic Valve: normal   Tricuspid Valve: normal   Pulmonic Valve: normal  Left Atrium/ Left atrial appendage: no thrombi  Atrial septum: no PFO or ASD by color flow   Aorta: normal   He received Hydralazine 10 mg IV for a BP of 0000000     Complications: No apparent complications Patient did tolerate procedure well.   Thayer Headings, Brooke Bonito., MD, [facility] 11/13/2016, 8:35 AM

## 2016-11-13 NOTE — Anesthesia Procedure Notes (Signed)
Procedure Name: Intubation Date/Time: 11/13/2016 12:05 PM Performed by: Jenne Campus Pre-anesthesia Checklist: Patient identified, Emergency Drugs available, Suction available and Patient being monitored Patient Re-evaluated:Patient Re-evaluated prior to inductionOxygen Delivery Method: Circle System Utilized Preoxygenation: Pre-oxygenation with 100% oxygen Intubation Type: IV induction Ventilation: Mask ventilation without difficulty Laryngoscope Size: Miller and 3 Grade View: Grade II Tube type: Oral Tube size: 7.5 mm Number of attempts: 1 Airway Equipment and Method: Stylet and Oral airway Placement Confirmation: ETT inserted through vocal cords under direct vision,  positive ETCO2 and breath sounds checked- equal and bilateral Secured at: 22 cm Tube secured with: Tape Dental Injury: Teeth and Oropharynx as per pre-operative assessment

## 2016-11-13 NOTE — Progress Notes (Signed)
Following TEE, pt's BP remained elevated 201/142.  10mg  IV hydralazine given per Dr Elmarie Shiley order.  Pt will continue to be monitored.  Vista Lawman, RN

## 2016-11-13 NOTE — Progress Notes (Signed)
Site area: right groin 3 fv sheaths Site Prior to Removal:  Level  0 Pressure Applied For:  20 minutes  Manual:   yes Patient Status During Pull:  stable Post Pull Site:  Level  0 Post Pull Instructions Given:  yes Post Pull Pulses Present: yes Dressing Applied:  Gauze/tegaderm Bedrest begins @  1630 Comments:  IV saline locked

## 2016-11-13 NOTE — Anesthesia Preprocedure Evaluation (Addendum)
Anesthesia Evaluation  Patient identified by MRN, date of birth, ID band Patient awake    Reviewed: Allergy & Precautions, NPO status , Patient's Chart, lab work & pertinent test results  History of Anesthesia Complications Negative for: history of anesthetic complications  Airway Mallampati: III  TM Distance: >3 FB Neck ROM: Full    Dental  (+) Dental Advisory Given, Poor Dentition   Pulmonary shortness of breath, sleep apnea , Current Smoker, former smoker,    breath sounds clear to auscultation       Cardiovascular hypertension, Pt. on medications + Past MI  Normal cardiovascular exam+ dysrhythmias Atrial Fibrillation  Rhythm:Regular     Neuro/Psych negative neurological ROS  negative psych ROS   GI/Hepatic Neg liver ROS, GERD  Medicated and Controlled,  Endo/Other  Morbid obesity  Renal/GU negative Renal ROS     Musculoskeletal   Abdominal   Peds  Hematology   Anesthesia Other Findings   Reproductive/Obstetrics                           Anesthesia Physical  Anesthesia Plan  ASA: III  Anesthesia Plan: General   Post-op Pain Management:    Induction: Intravenous  Airway Management Planned: Oral ETT  Additional Equipment:   Intra-op Plan:   Post-operative Plan: Extubation in OR  Informed Consent: I have reviewed the patients History and Physical, chart, labs and discussed the procedure including the risks, benefits and alternatives for the proposed anesthesia with the patient or authorized representative who has indicated his/her understanding and acceptance.   Dental advisory given  Plan Discussed with: CRNA, Surgeon and Anesthesiologist  Anesthesia Plan Comments:        Anesthesia Quick Evaluation

## 2016-11-13 NOTE — H&P (Signed)
Chief Complaint  Patient presents with  . Atrial Fibrillation     History of Present Illness: Danny Kelley is a 51 y.o. male who presents today for afib ablation.   The patient reports inially being diagnosed with atrial fibrillation June 2017 after presenting with symptoms of shortness of breath, orthopnea, edema, and decreased exercise tolerance.  He presented to the ER in [place] and was diagnosed with URI.  His symptoms worsened and he presented to urgent care where he was found to have afib.  He went to [facility] and was initiated on medical therapy.  He went to Dr Einar Gip for further evaluation.  He was placed on eliquis and underwent cardioversion 05/27/2016.  He felt well for about a week before returning to afib.  He was admitted for sotalol and underwent repeat cardioversion.  He did well initially.  He developed presyncope and was found to have prolonged QT.  Sotalol was discontinued.  He was tried on [place] but could not continue this due to qt prolongation.  He was placed on multaq.  He has had intermittent afib since that time.  He feels "much better" in normal rhythm.  He is unaware of triggers for afib. Today, he denies symptoms of chest pain, shortness of breath, orthopnea, PND, lower extremity edema, claudication, dizziness, syncope, bleeding, or neurologic sequela. The patient is tolerating medications without difficulties and is otherwise without complaint today.        Past Medical History:  Diagnosis Date  . Arthritis    "a little in my right knee" (07/14/2016)  . CAD (coronary artery disease)   . Chronic congestive heart failure with left ventricular diastolic dysfunction ([place])   . Complication of anesthesia    Pt reports "they have a hard time waking me up"  . GERD (gastroesophageal reflux disease)    hx  . Headache    "with every heart issue that I have" (07/14/2016)  . High cholesterol   . History of hiatal hernia 1990s   "fixed it w/scope  down my throat"  . Hypertension   . Myocardial infarction 05/2013   stents: left PDA, 1st OM at [facility]  . Obesity   . Obstructive sleep apnea    previous test "inconclusive" per patient,  has appointment with [facility] 10/06/16 for evaluation  . Persistent atrial fibrillation ([place])   . Pneumonia ~ 1992  . Seasonal allergies    "I take Allegra prn" (07/14/2016)        Past Surgical History:  Procedure Laterality Date  . APPENDECTOMY  1984  . CARDIOVERSION N/A 05/27/2016   Procedure: CARDIOVERSION;  Surgeon: [name], MD;  Location: [facility] ENDOSCOPY;  Service: Cardiovascular;  Laterality: N/A;  . CARDIOVERSION N/A 06/11/2016   Procedure: CARDIOVERSION;  Surgeon: [name], MD;  Location: [facility];  Service: Cardiovascular;  Laterality: N/A;  . CORONARY ANGIOPLASTY WITH STENT PLACEMENT  05/30/2013   "2 stents put in at [facility]" & /stent card  . ESOPHAGOGASTRODUODENOSCOPY (EGD) WITH ESOPHAGEAL DILATION  1990s X 2  . KNEE ARTHROSCOPY Right 1986; ~ 1995 X 2  . LAPAROSCOPIC CHOLECYSTECTOMY  2015  . REPAIR OF ESOPHAGUS  1998   perforation of the distal esophagus from bad heartburn           Current Outpatient Prescriptions  Medication Sig Dispense Refill  . acetaminophen (TYLENOL) 500 MG tablet Take 1,000 mg by mouth every 6 (six) hours as needed for headache (pain).    Marland Kitchen apixaban (ELIQUIS)  5 MG TABS tablet Take 5 mg by mouth 2 (two) times daily.    Marland Kitchen b complex vitamins tablet Take 1 tablet by mouth daily.    . bisoprolol (ZEBETA) 10 MG tablet Take 2 tablets (20 mg total) by mouth daily. 60 tablet 1  . cholecalciferol (VITAMIN D) 1000 units tablet Take 1,000 Units by mouth daily.    Marland Kitchen dronedarone (MULTAQ) 400 MG tablet Take 400 mg by mouth 2 (two) times daily with a meal.    . ezetimibe (ZETIA) 10 MG tablet Take 10 mg by mouth daily.    . fexofenadine (ALLEGRA) 180 MG tablet Take 180 mg by mouth daily as needed (seasonal  allergies).     . Flaxseed, Linseed, (FLAX SEED OIL PO) Take 2,400 mg by mouth daily.    . Multiple Vitamin (MULTIVITAMIN WITH MINERALS) TABS tablet Take 1 tablet by mouth daily. Centrum    . Omega-3 Fatty Acids (FISH OIL) 1200 MG CAPS Take 1,200 mg by mouth daily.    . phenylephrine-shark liver oil-mineral oil-petrolatum (PREPARATION H) 0.25-3-14-71.9 % rectal ointment Place 1 application rectally 2 (two) times daily as needed for hemorrhoids.    . shark liver oil-cocoa butter (PREPARATION H) 0.25-3-85.5 % suppository Place 1 suppository rectally 2 (two) times daily as needed for hemorrhoids.    . valsartan-hydrochlorothiazide (DIOVAN HCT) 160-12.5 MG tablet Take 1 tablet by mouth daily. 30 tablet 1  . zolpidem (AMBIEN) 5 MG tablet Take 5 mg by mouth at bedtime.     No current facility-administered medications for this visit.     Allergies:   Crestor [rosuvastatin]; Erythromycin; and Percocet [oxycodone-acetaminophen]   Social History:  The patient  reports that he has been smoking Cigarettes and E-cigarettes.  He has a 25.00 pack-year smoking history. He has never used smokeless tobacco. He reports that he does not drink alcohol or use drugs.   Family History:  The patient's family history includes Breast cancer in his mother; Congestive Heart Failure in his father; Diabetes in his father and mother; Heart attack (age of onset: 37) in his father; Heart disease (age of onset: 30) in his mother; Lung cancer in his father and paternal uncle; Tongue cancer in his mother.    ROS:  Please see the history of present illness.   All other systems are reviewed and negative.    PHYSICAL EXAM: Vitals:   11/13/16 0732  BP: (!) 165/115  Pulse: 83  Resp: 18  Temp: 98 F (36.7 C)   GEN: overweight, in no acute distress  HEENT: normal  Neck: no JVD, carotid bruits, or masses Cardiac: RRR; no murmurs, rubs, or gallops,no edema  Respiratory:  clear to auscultation  bilaterally, normal work of breathing GI: soft, nontender, nondistended, + BS MS: no deformity or atrophy  Skin: warm and dry  Neuro:  Strength and sensation are intact Psych: euthymic mood, full affect  EKG:  EKG is ordered today. The ekg ordered today shows sinus rhythm  Recent Labs: reviewed     Wt Readings from Last 3 Encounters:  09/29/16 249 lb 6.4 oz (113.1 kg)  09/07/16 247 lb 5.7 oz (112.2 kg)  07/15/16 240 lb 8 oz (109.1 kg)     ASSESSMENT AND PLAN:  1.  Persistent atrial fibrillation The patient has symptomatic atrial fibrillation.  He has required cardioversion twice frequently.  He has failed medical therapy with sotalol, tikosyn, and multaq.  He has GI symptoms with multaq. Therapeutic strategies for afib including medicine and ablation were discussed  in detail with the patient today. Risk, benefits, and alternatives to EP study and radiofrequency ablation for afib were also discussed in detail today. These risks include but are not limited to stroke, bleeding, vascular damage, tamponade, perforation, damage to the esophagus, lungs, and other structures, pulmonary vein stenosis, worsening renal function, and death.  He is aware that with prior esophageal surgery that risks to esophagus may be increased. The patient understands these risk and wishes to proceed.  He reports compliance with eliquis without interuption.  LAA not well visualized on cardiac CT (Reviewed).  Will therefore proceed with TEE by Dr Acie Fredrickson.  Though he has had prior esophagitis and has required dilatation, he denies any symptoms of dysphagia currently.  [name] MD, [facility] 11/13/2016 8:07 AM

## 2016-11-13 NOTE — Discharge Summary (Signed)
ELECTROPHYSIOLOGY PROCEDURE DISCHARGE SUMMARY    Patient ID: Danny Kelley,  MRN: [id number], DOB/AGE: 1966-06-26 51 y.o.  Admit date: 11/13/2016 Discharge date: 11/14/16  Primary Care Physician: No PCP Per Patient  Primary Cardiologist: Dr. Einar Gip Electrophysiologist: [name], MD  Primary Discharge Diagnosis:  1. Persistent Afib     CHA2DS2Vasc is at least 2, on Eliquis  Secondary Discharge Diagnosis:  1. HTN 2. CAD  Procedures This Admission:  1.  Electrophysiology study and radiofrequency catheter ablation on 11/13/16 by Dr [name].   This study demonstrated  CONCLUSIONS: 1. Sinus rhythm upon presentation.   2. Intracardiac echo reveals a moderate sized left atrium with four separate pulmonary veins without evidence of pulmonary vein stenosis. 3. Successful electrical isolation and anatomical encircling of all four pulmonary veins with radiofrequency current. 4. No inducible arrhythmias following ablation both on and off of Isuprel 5. No early apparent complications.  Brief HPI: Danny Kelley is a 51 y.o. male with a history of persistent atrial fibrillation.  They have failed medical therapy with Sotalol,Tikosyn, multaq. Risks, benefits, and alternatives to catheter ablation of atrial fibrillation were reviewed with the patient who wished to proceed.  The patient underwent TEE prior to the procedure which demonstrated normal LV function and no LAA thrombus.    Hospital Course:  The patient was admitted and underwent EPS/RFCA of atrial fibrillation with details as outlined above.  They were monitored on telemetry overnight which demonstrated SR, occ PVCs.  Groin was without complication on the day of discharge.  The patient was examined by Dr. Curt Bears and considered to be stable for discharge.  Wound care and restrictions were reviewed with the patient.  The patient will be seen back by Roderic Palau, NP in 4 weeks and Dr Rayann Heman in 12 weeks for post ablation follow  up.    Physical Exam: Vitals:   11/13/16 2049 11/14/16 0011 11/14/16 0546 11/14/16 0748  BP: (!) 148/109 124/64 119/78 135/82  Pulse: 98 89 86 81  Resp: 18 18 19 17   Temp:  98.4 F (36.9 C) 98.6 F (37 C) 97.5 F (36.4 C)  TempSrc:  Oral Oral Oral  SpO2: 96% 96% 95% 96%  Weight:   250 lb 3.2 oz (113.5 kg)   Height:         GEN- The patient is well appearing, alert and oriented x 3 today.   HEENT: normocephalic, atraumatic; sclera clear, conjunctiva pink; hearing intact; oropharynx clear; neck supple  Lungs- Clear to ausculation bilaterally, normal work of breathing.  No wheezes, rales, rhonchi Heart- RRR, no murmurs, rubs or gallops  GI- soft, non-tender, non-distended Extremities- no clubbing, cyanosis, or edema; DP/PT/radial pulses 2+ bilaterally, right groin without hematoma/bruit, is soft non-tender, no bleeding. MS- no significant deformity or atrophy Skin- warm and dry, no rash or lesion Psych- euthymic mood, full affect Neuro- strength and sensation are intact   Labs:   Lab Results  Component Value Date   WBC 10.7 10/29/2016   HGB 16.1 10/29/2016   HCT 47.4 10/29/2016   MCV 86.5 10/29/2016   PLT 253 10/29/2016   No results for input(s): NA, K, CL, CO2, BUN, CREATININE, CALCIUM, PROT, BILITOT, ALKPHOS, ALT, AST, GLUCOSE in the last 168 hours.  Invalid input(s): LABALBU   Discharge Medications:  Allergies as of 11/14/2016      Reactions   Crestor [rosuvastatin] Other (See Comments)   Severe leg cramps   Percocet [oxycodone-acetaminophen] Itching, Swelling, Rash, Other (See Comments)  Swelling to face, hands, mouth   Erythromycin Other (See Comments)   Hallucinations      Medication List    TAKE these medications   acetaminophen 500 MG tablet Commonly known as:  TYLENOL Take 1,000 mg by mouth every 6 (six) hours as needed for headache (pain).   b complex vitamins tablet Take 1 tablet by mouth daily.   bisoprolol 10 MG tablet Commonly known as:   ZEBETA Take 2 tablets (20 mg total) by mouth daily.   cholecalciferol 1000 units tablet Commonly known as:  VITAMIN D Take 1,000 Units by mouth daily.   dronedarone 400 MG tablet Commonly known as:  MULTAQ Take 400 mg by mouth 2 (two) times daily with a meal.   ELIQUIS 5 MG Tabs tablet Generic drug:  apixaban Take 5 mg by mouth 2 (two) times daily.   ezetimibe 10 MG tablet Commonly known as:  ZETIA Take 10 mg by mouth daily.   fexofenadine 180 MG tablet Commonly known as:  ALLEGRA Take 180 mg by mouth daily as needed (seasonal allergies).   Fish Oil 1200 MG Caps Take 1,200 mg by mouth daily.   FLAX SEED OIL PO Take 2,400 mg by mouth daily.   multivitamin with minerals Tabs tablet Take 1 tablet by mouth daily. Centrum   phenylephrine-shark liver oil-mineral oil-petrolatum 0.25-3-14-71.9 % rectal ointment Commonly known as:  PREPARATION H Place 1 application rectally 2 (two) times daily as needed for hemorrhoids.   shark liver oil-cocoa butter 0.25-3-85.5 % suppository Commonly known as:  PREPARATION H Place 1 suppository rectally 2 (two) times daily as needed for hemorrhoids.   valsartan-hydrochlorothiazide 160-12.5 MG tablet Commonly known as:  DIOVAN HCT Take 1 tablet by mouth daily.   zolpidem 5 MG tablet Commonly known as:  AMBIEN Take 5 mg by mouth at bedtime.       Disposition:  Home  Discharge Instructions    Diet - low sodium heart healthy    Complete by:  As directed    Increase activity slowly    Complete by:  As directed      Follow-up Information    [facility] Follow up on 12/15/2016.   Specialty:  Cardiology Why:  2:30PM Contact information: [address] [id number] [address] [place] [phone]       [name], MD Follow up on 01/28/2017.   Specialty:  Cardiology Why:  11:00AM Contact information: [place] [address] [phone]            Duration of Discharge Encounter: Greater than 30 minutes including physician time.  Venetia Night, PA-C 11/14/2016 9:13 AM

## 2016-11-13 NOTE — Transfer of Care (Signed)
Immediate Anesthesia Transfer of Care Note  [name]  Procedure(s) Performed: Procedure(s): Atrial Fibrillation Ablation (N/A)  Patient Location: PACU  Anesthesia Type:General  Level of Consciousness: awake, oriented and patient cooperative  Airway & Oxygen Therapy: Patient Spontanous Breathing and Patient connected to face mask oxygen  Post-op Assessment: Report given to RN and Post -op Vital signs reviewed and stable  Post vital signs: Reviewed  Last Vitals:  Vitals:   11/13/16 1540 11/13/16 1545  BP: 119/81 123/86  Pulse: 89 87  Resp: 12 15  Temp:      Last Pain:  Vitals:   11/13/16 0845  TempSrc: Oral         Complications: No apparent anesthesia complications

## 2016-11-13 NOTE — Progress Notes (Signed)
  Echocardiogram Echocardiogram Transesophageal has been performed.  Donata Clay 11/13/2016, 9:31 AM

## 2016-11-14 ENCOUNTER — Telehealth: Payer: Self-pay | Admitting: Physician Assistant

## 2016-11-14 ENCOUNTER — Encounter (HOSPITAL_COMMUNITY): Payer: Self-pay | Admitting: Internal Medicine

## 2016-11-14 DIAGNOSIS — I5032 Chronic diastolic (congestive) heart failure: Secondary | ICD-10-CM | POA: Diagnosis not present

## 2016-11-14 DIAGNOSIS — I110 Hypertensive heart disease with heart failure: Secondary | ICD-10-CM | POA: Diagnosis not present

## 2016-11-14 DIAGNOSIS — I2510 Atherosclerotic heart disease of native coronary artery without angina pectoris: Secondary | ICD-10-CM | POA: Diagnosis not present

## 2016-11-14 DIAGNOSIS — I481 Persistent atrial fibrillation: Secondary | ICD-10-CM | POA: Diagnosis not present

## 2016-11-14 NOTE — Discharge Instructions (Signed)
No driving for 1 week. No lifting over 5 lbs for 1 week. No vigorous or sexual activity for 1 week. You may return to work on 11/20/16. Keep procedure site clean & dry. If you notice increased pain, swelling, bleeding or pus, call/return!  You may shower, but no soaking baths/hot tubs/pools for 1 week.     You have an appointment set up with the [facility].  Multiple studies have shown that being followed by a dedicated atrial fibrillation clinic in addition to the standard care you receive from your other physicians improves health. We believe that enrollment in the atrial fibrillation clinic will allow [place] to better care for you.   The phone number to the [facility] is [phone]. The clinic is staffed Monday through Friday from 8:30am to 5pm.  Parking Directions: The clinic is located in the Heart and Vascular Building connected to [facility]. 1)From [address] turn on to [address] and go to the 3rd entrance  (Heart and Vascular entrance) on the right. 2)Look to the right for Heart &Vascular Parking Garage. 3)A code for the entrance is required please call the clinic to receive this.   4)Take the elevators to the 1st floor. Registration is in the room with the glass walls at the end of the hallway.  If you have any trouble parking or locating the clinic, please dont hesitate to call [phone].

## 2016-11-14 NOTE — Anesthesia Postprocedure Evaluation (Signed)
Anesthesia Post Note  [name]  Procedure(s) Performed: Procedure(s) (LRB): Atrial Fibrillation Ablation (N/A)  Patient location during evaluation: PACU Anesthesia Type: General Level of consciousness: sedated Pain management: pain level controlled Vital Signs Assessment: post-procedure vital signs reviewed and stable Respiratory status: spontaneous breathing and respiratory function stable Cardiovascular status: stable Anesthetic complications: no              [name] DANIEL

## 2016-11-14 NOTE — Telephone Encounter (Signed)
I called in a Rx for Protonix 40mg  one PO daily to the patient's listed pharmacy, not included on his discharge medicines/instructions.  I called the patient to inform him and provide these instructions.  I left a message at his listed home number (his listed preferred mobile number did not answer and no option to leave a message) that I left a one time rx for 45 days regime of protonix for him at his pharmacy, which is routine for post ablation procedure, if he had any questions to call.  Tommye Standard, [facility]

## 2016-11-14 NOTE — Telephone Encounter (Signed)
Addendum:  I  spoke with the patient, he is aware of the Protonix Rx, was given instructions on how to take the medication.  He is feeling well without symptoms.  No bleeding or procedure site concerns.  He is reminded of his activity restrictions.  Tommye Standard, [facility]

## 2016-11-17 NOTE — Telephone Encounter (Signed)
Opened in error

## 2016-12-15 ENCOUNTER — Encounter (HOSPITAL_COMMUNITY): Payer: Self-pay | Admitting: Nurse Practitioner

## 2016-12-15 ENCOUNTER — Ambulatory Visit (HOSPITAL_COMMUNITY)
Admission: RE | Admit: 2016-12-15 | Discharge: 2016-12-15 | Disposition: A | Discharge status: Home / Self Care | Payer: BLUE CROSS/BLUE SHIELD | Source: Ambulatory Visit | Attending: Nurse Practitioner | Admitting: Nurse Practitioner

## 2016-12-15 VITALS — BP 122/76 | HR 69 | Ht 70.0 in | Wt 245.2 lb

## 2016-12-15 DIAGNOSIS — I4891 Unspecified atrial fibrillation: Secondary | ICD-10-CM | POA: Diagnosis not present

## 2016-12-15 DIAGNOSIS — I2510 Atherosclerotic heart disease of native coronary artery without angina pectoris: Secondary | ICD-10-CM | POA: Insufficient documentation

## 2016-12-15 DIAGNOSIS — Z8249 Family history of ischemic heart disease and other diseases of the circulatory system: Secondary | ICD-10-CM | POA: Insufficient documentation

## 2016-12-15 DIAGNOSIS — G4733 Obstructive sleep apnea (adult) (pediatric): Secondary | ICD-10-CM | POA: Diagnosis not present

## 2016-12-15 DIAGNOSIS — I481 Persistent atrial fibrillation: Secondary | ICD-10-CM | POA: Diagnosis not present

## 2016-12-15 DIAGNOSIS — E7800 Pure hypercholesterolemia, unspecified: Secondary | ICD-10-CM | POA: Insufficient documentation

## 2016-12-15 DIAGNOSIS — I110 Hypertensive heart disease with heart failure: Secondary | ICD-10-CM | POA: Diagnosis not present

## 2016-12-15 DIAGNOSIS — E669 Obesity, unspecified: Secondary | ICD-10-CM | POA: Insufficient documentation

## 2016-12-15 DIAGNOSIS — Z888 Allergy status to other drugs, medicaments and biological substances status: Secondary | ICD-10-CM | POA: Diagnosis not present

## 2016-12-15 DIAGNOSIS — F17210 Nicotine dependence, cigarettes, uncomplicated: Secondary | ICD-10-CM | POA: Insufficient documentation

## 2016-12-15 DIAGNOSIS — I5032 Chronic diastolic (congestive) heart failure: Secondary | ICD-10-CM | POA: Diagnosis not present

## 2016-12-15 DIAGNOSIS — Z885 Allergy status to narcotic agent status: Secondary | ICD-10-CM | POA: Diagnosis not present

## 2016-12-15 DIAGNOSIS — Z9889 Other specified postprocedural states: Secondary | ICD-10-CM | POA: Diagnosis not present

## 2016-12-15 DIAGNOSIS — Z801 Family history of malignant neoplasm of trachea, bronchus and lung: Secondary | ICD-10-CM | POA: Insufficient documentation

## 2016-12-15 DIAGNOSIS — Z803 Family history of malignant neoplasm of breast: Secondary | ICD-10-CM | POA: Insufficient documentation

## 2016-12-15 DIAGNOSIS — Z79899 Other long term (current) drug therapy: Secondary | ICD-10-CM | POA: Insufficient documentation

## 2016-12-15 DIAGNOSIS — K219 Gastro-esophageal reflux disease without esophagitis: Secondary | ICD-10-CM | POA: Insufficient documentation

## 2016-12-15 DIAGNOSIS — I4819 Other persistent atrial fibrillation: Secondary | ICD-10-CM

## 2016-12-15 DIAGNOSIS — Z833 Family history of diabetes mellitus: Secondary | ICD-10-CM | POA: Diagnosis not present

## 2016-12-15 DIAGNOSIS — K449 Diaphragmatic hernia without obstruction or gangrene: Secondary | ICD-10-CM | POA: Insufficient documentation

## 2016-12-15 DIAGNOSIS — I252 Old myocardial infarction: Secondary | ICD-10-CM | POA: Insufficient documentation

## 2016-12-15 DIAGNOSIS — Z800 Family history of malignant neoplasm of digestive organs: Secondary | ICD-10-CM | POA: Insufficient documentation

## 2016-12-15 NOTE — Progress Notes (Signed)
Primary Care Physician: No PCP Per Patient Referring Physician: Dr. Rosanne Ashing is a 51 y.o. male with a h/o afib, failing sotalol, tikosyn and multaq. He had an ablation with Dr. Rayann Heman, 1/11. He describes possibly one episode of afib since the procedure but did not check his blood pressure or HR to see if  vital signs confirmed afib. He denies any swallowing difficulties and or rt groin issues since the procedure.  Today, he denies symptoms of palpitations, chest pain, shortness of breath, orthopnea, PND, lower extremity edema, dizziness, presyncope, syncope, or neurologic sequela. The patient is tolerating medications without difficulties and is otherwise without complaint today.   Past Medical History:  Diagnosis Date  . Arthritis    "a little in my right knee" (07/14/2016)  . CAD (coronary artery disease)   . Chronic congestive heart failure with left ventricular diastolic dysfunction ([place])   . Complication of anesthesia    Pt reports "they have a hard time waking me up"  . GERD (gastroesophageal reflux disease)    hx  . Headache    "with every heart issue that I have" (07/14/2016)  . High cholesterol   . History of hiatal hernia 1990s   "fixed it w/scope down my throat"  . Hypertension   . Myocardial infarction 05/2013   stents: left PDA, 1st OM at [facility]  . Obesity   . Obstructive sleep apnea    previous test "inconclusive" per patient,  has appointment with [facility] 10/06/16 for evaluation  . Persistent atrial fibrillation ([place])   . Pneumonia ~ 1992  . Seasonal allergies    "I take Allegra prn" (07/14/2016)   Past Surgical History:  Procedure Laterality Date  . APPENDECTOMY  1984  . CARDIOVERSION N/A 05/27/2016   Procedure: CARDIOVERSION;  Surgeon: [name], MD;  Location: [facility] ENDOSCOPY;  Service: Cardiovascular;  Laterality: N/A;  . CARDIOVERSION N/A 06/11/2016   Procedure: CARDIOVERSION;  Surgeon: [name], MD;  Location: [place];   Service: Cardiovascular;  Laterality: N/A;  . CORONARY ANGIOPLASTY WITH STENT PLACEMENT  05/30/2013   "2 stents put in at [facility]" & /stent card  . ELECTROPHYSIOLOGIC STUDY N/A 11/13/2016   Procedure: Atrial Fibrillation Ablation;  Surgeon: [name], MD;  Location: [place] CV LAB;  Service: Cardiovascular;  Laterality: N/A;  . ESOPHAGOGASTRODUODENOSCOPY (EGD) WITH ESOPHAGEAL DILATION  1990s X 2  . KNEE ARTHROSCOPY Right 1986; ~ 1995 X 2  . LAPAROSCOPIC CHOLECYSTECTOMY  2015  . REPAIR OF ESOPHAGUS  1998   perforation of the distal esophagus from bad heartburn  . TEE WITHOUT CARDIOVERSION N/A 11/13/2016   Procedure: TRANSESOPHAGEAL ECHOCARDIOGRAM (TEE);  Surgeon: Thayer Headings, MD;  Location: [facility] ENDOSCOPY;  Service: Cardiovascular;  Laterality: N/A;    Current Outpatient Prescriptions  Medication Sig Dispense Refill  . acetaminophen (TYLENOL) 500 MG tablet Take 1,000 mg by mouth every 6 (six) hours as needed for headache (pain).    Marland Kitchen apixaban (ELIQUIS) 5 MG TABS tablet Take 5 mg by mouth 2 (two) times daily.    Marland Kitchen b complex vitamins tablet Take 1 tablet by mouth daily.    . bisoprolol (ZEBETA) 10 MG tablet Take 2 tablets (20 mg total) by mouth daily. 60 tablet 1  . cholecalciferol (VITAMIN D) 1000 units tablet Take 1,000 Units by mouth daily.    Marland Kitchen dronedarone (MULTAQ) 400 MG tablet Take 400 mg by mouth 2 (two) times daily with a meal.    . ezetimibe (  ZETIA) 10 MG tablet Take 10 mg by mouth daily.    . fexofenadine (ALLEGRA) 180 MG tablet Take 180 mg by mouth daily as needed (seasonal allergies).     . Flaxseed, Linseed, (FLAX SEED OIL PO) Take 2,400 mg by mouth daily.    . Multiple Vitamin (MULTIVITAMIN WITH MINERALS) TABS tablet Take 1 tablet by mouth daily. Centrum    . Omega-3 Fatty Acids (FISH OIL) 1200 MG CAPS Take 1,200 mg by mouth daily.    . phenylephrine-shark liver oil-mineral oil-petrolatum (PREPARATION H) 0.25-3-14-71.9 % rectal ointment Place 1 application rectally 2  (two) times daily as needed for hemorrhoids.    . shark liver oil-cocoa butter (PREPARATION H) 0.25-3-85.5 % suppository Place 1 suppository rectally 2 (two) times daily as needed for hemorrhoids.    . valsartan-hydrochlorothiazide (DIOVAN HCT) 160-12.5 MG tablet Take 1 tablet by mouth daily. 30 tablet 1  . zolpidem (AMBIEN) 5 MG tablet Take 5 mg by mouth at bedtime.     No current facility-administered medications for this encounter.     Allergies  Allergen Reactions  . Crestor [Rosuvastatin] Other (See Comments)    Severe leg cramps  . Percocet [Oxycodone-Acetaminophen] Itching, Swelling, Rash and Other (See Comments)    Swelling to face, hands, mouth  . Erythromycin Other (See Comments)    Hallucinations     Social History   Social History  . Marital status: Married    Spouse name: N/A  . Number of children: N/A  . Years of education: N/A   Occupational History  . Not on file.   Social History Main Topics  . Smoking status: Light Tobacco Smoker    Packs/day: 1.00    Years: 25.00    Types: Cigarettes, E-cigarettes    Last attempt to quit: 01/07/2015  . Smokeless tobacco: Never Used     Comment: As of 09/29/16-smoking 6-7 cigarettes a day  . Alcohol use No  . Drug use: No  . Sexual activity: Yes   Other Topics Concern  . Not on file   Social History Narrative   Pt lives in [place] with spouse and 91 year old son.   Works Nurse, adult for [employer].    Family History  Problem Relation Age of Onset  . Heart disease Mother 15    CABG age 32  . Breast cancer Mother   . Tongue cancer Mother   . Diabetes Mother   . Heart attack Father 27    Father had around 7 heart attacks per pt  . Diabetes Father   . Lung cancer Father   . Congestive Heart Failure Father   . Lung cancer Paternal Uncle     ROS- All systems are reviewed and negative except as per the HPI above  Physical Exam: Vitals:   12/15/16 1443  BP: 122/76  Pulse: 69  Weight: 245 lb 3.2  oz (111.2 kg)  Height: 5\' 10"  (1.778 m)   Wt Readings from Last 3 Encounters:  12/15/16 245 lb 3.2 oz (111.2 kg)  11/14/16 250 lb 3.2 oz (113.5 kg)  09/29/16 249 lb 6.4 oz (113.1 kg)    Labs: Lab Results  Component Value Date   NA 139 10/29/2016   K 3.9 10/29/2016   CL 105 10/29/2016   CO2 28 10/29/2016   GLUCOSE 137 (H) 10/29/2016   BUN 13 10/29/2016   CREATININE 0.79 10/29/2016   CALCIUM 8.7 10/29/2016   MG 2.4 09/09/2016   No results found for: INR No results  found for: CHOL, HDL, LDLCALC, TRIG   GEN- The patient is well appearing, alert and oriented x 3 today.   Head- normocephalic, atraumatic Eyes-  Sclera clear, conjunctiva pink Ears- hearing intact Oropharynx- clear Neck- supple, no JVP Lymph- no cervical lymphadenopathy Lungs- Clear to ausculation bilaterally, normal work of breathing Heart- Regular rate and rhythm, no murmurs, rubs or gallops, PMI not laterally displaced GI- soft, NT, ND, + BS Extremities- no clubbing, cyanosis, or edema MS- no significant deformity or atrophy Skin- no rash or lesion Psych- euthymic mood, full affect Neuro- strength and sensation are intact  EKG- Sinus rhythm at 69 bpm, PR int 154 ms, qrs int 88 ms, qtc 454 ms Epic records reviewed    Assessment and Plan: 1. Persistent afib Afib ablation 11/14/16 and appears to be be staying in SR Continue eliquis 5 mg a day for CHA2DS2VASc score of at least 2 Continue multaq 400 mg bid and bisoprolol 10 mg a day  F/u with Dr. Rayann Heman as scheduled 3/28 afib clinic as needed

## 2016-12-16 ENCOUNTER — Observation Stay (HOSPITAL_COMMUNITY): Payer: BLUE CROSS/BLUE SHIELD

## 2016-12-16 ENCOUNTER — Emergency Department (HOSPITAL_COMMUNITY): Payer: BLUE CROSS/BLUE SHIELD

## 2016-12-16 ENCOUNTER — Observation Stay (HOSPITAL_COMMUNITY)
Admission: EM | Admit: 2016-12-16 | Discharge: 2016-12-16 | Disposition: A | Discharge status: Home / Self Care | Payer: BLUE CROSS/BLUE SHIELD | Attending: Internal Medicine | Admitting: Internal Medicine

## 2016-12-16 ENCOUNTER — Encounter (HOSPITAL_COMMUNITY): Payer: Self-pay | Admitting: Emergency Medicine

## 2016-12-16 ENCOUNTER — Observation Stay (HOSPITAL_BASED_OUTPATIENT_CLINIC_OR_DEPARTMENT_OTHER): Payer: BLUE CROSS/BLUE SHIELD

## 2016-12-16 DIAGNOSIS — E7800 Pure hypercholesterolemia, unspecified: Secondary | ICD-10-CM | POA: Diagnosis not present

## 2016-12-16 DIAGNOSIS — I10 Essential (primary) hypertension: Secondary | ICD-10-CM

## 2016-12-16 DIAGNOSIS — I481 Persistent atrial fibrillation: Secondary | ICD-10-CM | POA: Insufficient documentation

## 2016-12-16 DIAGNOSIS — K449 Diaphragmatic hernia without obstruction or gangrene: Secondary | ICD-10-CM | POA: Diagnosis not present

## 2016-12-16 DIAGNOSIS — Z9861 Coronary angioplasty status: Secondary | ICD-10-CM | POA: Diagnosis not present

## 2016-12-16 DIAGNOSIS — G451 Carotid artery syndrome (hemispheric): Secondary | ICD-10-CM

## 2016-12-16 DIAGNOSIS — I6789 Other cerebrovascular disease: Secondary | ICD-10-CM

## 2016-12-16 DIAGNOSIS — E876 Hypokalemia: Secondary | ICD-10-CM | POA: Diagnosis present

## 2016-12-16 DIAGNOSIS — Z8249 Family history of ischemic heart disease and other diseases of the circulatory system: Secondary | ICD-10-CM | POA: Insufficient documentation

## 2016-12-16 DIAGNOSIS — Z720 Tobacco use: Secondary | ICD-10-CM

## 2016-12-16 DIAGNOSIS — Z6834 Body mass index (BMI) 34.0-34.9, adult: Secondary | ICD-10-CM | POA: Diagnosis not present

## 2016-12-16 DIAGNOSIS — G459 Transient cerebral ischemic attack, unspecified: Principal | ICD-10-CM | POA: Insufficient documentation

## 2016-12-16 DIAGNOSIS — R4781 Slurred speech: Secondary | ICD-10-CM | POA: Insufficient documentation

## 2016-12-16 DIAGNOSIS — M1711 Unilateral primary osteoarthritis, right knee: Secondary | ICD-10-CM | POA: Insufficient documentation

## 2016-12-16 DIAGNOSIS — Z833 Family history of diabetes mellitus: Secondary | ICD-10-CM | POA: Insufficient documentation

## 2016-12-16 DIAGNOSIS — I639 Cerebral infarction, unspecified: Secondary | ICD-10-CM | POA: Diagnosis not present

## 2016-12-16 DIAGNOSIS — M1990 Unspecified osteoarthritis, unspecified site: Secondary | ICD-10-CM | POA: Diagnosis not present

## 2016-12-16 DIAGNOSIS — I5032 Chronic diastolic (congestive) heart failure: Secondary | ICD-10-CM | POA: Insufficient documentation

## 2016-12-16 DIAGNOSIS — I110 Hypertensive heart disease with heart failure: Secondary | ICD-10-CM | POA: Diagnosis not present

## 2016-12-16 DIAGNOSIS — F17210 Nicotine dependence, cigarettes, uncomplicated: Secondary | ICD-10-CM | POA: Diagnosis not present

## 2016-12-16 DIAGNOSIS — R4701 Aphasia: Secondary | ICD-10-CM | POA: Diagnosis not present

## 2016-12-16 DIAGNOSIS — I2583 Coronary atherosclerosis due to lipid rich plaque: Secondary | ICD-10-CM | POA: Insufficient documentation

## 2016-12-16 DIAGNOSIS — Z888 Allergy status to other drugs, medicaments and biological substances status: Secondary | ICD-10-CM | POA: Diagnosis not present

## 2016-12-16 DIAGNOSIS — G4733 Obstructive sleep apnea (adult) (pediatric): Secondary | ICD-10-CM

## 2016-12-16 DIAGNOSIS — Z79899 Other long term (current) drug therapy: Secondary | ICD-10-CM | POA: Insufficient documentation

## 2016-12-16 DIAGNOSIS — K219 Gastro-esophageal reflux disease without esophagitis: Secondary | ICD-10-CM | POA: Diagnosis not present

## 2016-12-16 DIAGNOSIS — R531 Weakness: Secondary | ICD-10-CM | POA: Insufficient documentation

## 2016-12-16 DIAGNOSIS — Z9049 Acquired absence of other specified parts of digestive tract: Secondary | ICD-10-CM | POA: Insufficient documentation

## 2016-12-16 DIAGNOSIS — I2510 Atherosclerotic heart disease of native coronary artery without angina pectoris: Secondary | ICD-10-CM | POA: Insufficient documentation

## 2016-12-16 DIAGNOSIS — I480 Paroxysmal atrial fibrillation: Secondary | ICD-10-CM | POA: Diagnosis not present

## 2016-12-16 DIAGNOSIS — Z801 Family history of malignant neoplasm of trachea, bronchus and lung: Secondary | ICD-10-CM | POA: Insufficient documentation

## 2016-12-16 DIAGNOSIS — Z881 Allergy status to other antibiotic agents status: Secondary | ICD-10-CM | POA: Diagnosis not present

## 2016-12-16 DIAGNOSIS — I252 Old myocardial infarction: Secondary | ICD-10-CM | POA: Insufficient documentation

## 2016-12-16 DIAGNOSIS — H532 Diplopia: Secondary | ICD-10-CM | POA: Diagnosis not present

## 2016-12-16 DIAGNOSIS — E782 Mixed hyperlipidemia: Secondary | ICD-10-CM | POA: Diagnosis not present

## 2016-12-16 DIAGNOSIS — Z885 Allergy status to narcotic agent status: Secondary | ICD-10-CM | POA: Insufficient documentation

## 2016-12-16 DIAGNOSIS — Z955 Presence of coronary angioplasty implant and graft: Secondary | ICD-10-CM | POA: Insufficient documentation

## 2016-12-16 DIAGNOSIS — Z803 Family history of malignant neoplasm of breast: Secondary | ICD-10-CM | POA: Insufficient documentation

## 2016-12-16 DIAGNOSIS — Z800 Family history of malignant neoplasm of digestive organs: Secondary | ICD-10-CM | POA: Insufficient documentation

## 2016-12-16 DIAGNOSIS — Z7982 Long term (current) use of aspirin: Secondary | ICD-10-CM | POA: Insufficient documentation

## 2016-12-16 DIAGNOSIS — Z7901 Long term (current) use of anticoagulants: Secondary | ICD-10-CM | POA: Insufficient documentation

## 2016-12-16 DIAGNOSIS — K029 Dental caries, unspecified: Secondary | ICD-10-CM | POA: Insufficient documentation

## 2016-12-16 HISTORY — DX: Tobacco use: Z72.0

## 2016-12-16 LAB — CBC
HCT: 46.1 % (ref 39.0–52.0)
Hemoglobin: 16.1 g/dL (ref 13.0–17.0)
MCH: 29.4 pg (ref 26.0–34.0)
MCHC: 34.9 g/dL (ref 30.0–36.0)
MCV: 84.1 fL (ref 78.0–100.0)
PLATELETS: 243 10*3/uL (ref 150–400)
RBC: 5.48 MIL/uL (ref 4.22–5.81)
RDW: 13.5 % (ref 11.5–15.5)
WBC: 10 10*3/uL (ref 4.0–10.5)

## 2016-12-16 LAB — COMPREHENSIVE METABOLIC PANEL
ALT: 26 U/L (ref 17–63)
ANION GAP: 9 (ref 5–15)
AST: 25 U/L (ref 15–41)
Albumin: 3.3 g/dL — ABNORMAL LOW (ref 3.5–5.0)
Alkaline Phosphatase: 88 U/L (ref 38–126)
BILIRUBIN TOTAL: 0.2 mg/dL — AB (ref 0.3–1.2)
BUN: 13 mg/dL (ref 6–20)
CALCIUM: 9.1 mg/dL (ref 8.9–10.3)
CO2: 23 mmol/L (ref 22–32)
CREATININE: 0.9 mg/dL (ref 0.61–1.24)
Chloride: 103 mmol/L (ref 101–111)
Glucose, Bld: 123 mg/dL — ABNORMAL HIGH (ref 65–99)
Potassium: 3.3 mmol/L — ABNORMAL LOW (ref 3.5–5.1)
SODIUM: 135 mmol/L (ref 135–145)
TOTAL PROTEIN: 6.7 g/dL (ref 6.5–8.1)

## 2016-12-16 LAB — I-STAT CHEM 8, ED
BUN: 15 mg/dL (ref 6–20)
CALCIUM ION: 1.1 mmol/L — AB (ref 1.15–1.40)
CHLORIDE: 103 mmol/L (ref 101–111)
Creatinine, Ser: 0.8 mg/dL (ref 0.61–1.24)
GLUCOSE: 119 mg/dL — AB (ref 65–99)
HCT: 47 % (ref 39.0–52.0)
Hemoglobin: 16 g/dL (ref 13.0–17.0)
Potassium: 3.3 mmol/L — ABNORMAL LOW (ref 3.5–5.1)
Sodium: 139 mmol/L (ref 135–145)
TCO2: 25 mmol/L (ref 0–100)

## 2016-12-16 LAB — DIFFERENTIAL
Basophils Absolute: 0 10*3/uL (ref 0.0–0.1)
Basophils Relative: 0 %
EOS PCT: 3 %
Eosinophils Absolute: 0.3 10*3/uL (ref 0.0–0.7)
LYMPHS ABS: 3.6 10*3/uL (ref 0.7–4.0)
LYMPHS PCT: 36 %
MONO ABS: 0.8 10*3/uL (ref 0.1–1.0)
MONOS PCT: 8 %
NEUTROS ABS: 5.4 10*3/uL (ref 1.7–7.7)
Neutrophils Relative %: 53 %

## 2016-12-16 LAB — PROTIME-INR
INR: 1.02
PROTHROMBIN TIME: 13.4 s (ref 11.4–15.2)

## 2016-12-16 LAB — I-STAT TROPONIN, ED: Troponin i, poc: 0 ng/mL (ref 0.00–0.08)

## 2016-12-16 LAB — ECHOCARDIOGRAM COMPLETE
Height: 70 in
Weight: 3820.13 oz

## 2016-12-16 LAB — LIPID PANEL
CHOL/HDL RATIO: 4.3 ratio
Cholesterol: 230 mg/dL — ABNORMAL HIGH (ref 0–200)
HDL: 54 mg/dL (ref >40)
LDL CALC: 141 mg/dL — AB (ref 0–99)
Triglycerides: 175 mg/dL — ABNORMAL HIGH (ref <150)
VLDL: 35 mg/dL (ref 0–40)

## 2016-12-16 LAB — APTT: APTT: 33 s (ref 24–36)

## 2016-12-16 MED ORDER — ASPIRIN 325 MG PO TABS
325.0000 mg | ORAL_TABLET | Freq: Every day | ORAL | Status: DC
  Administered 2016-12-16: 325 mg via ORAL
  Filled 2016-12-16: qty 1

## 2016-12-16 MED ORDER — APIXABAN 5 MG PO TABS
5.0000 mg | ORAL_TABLET | Freq: Two times a day (BID) | ORAL | Status: DC
  Administered 2016-12-16: 5 mg via ORAL
  Filled 2016-12-16 (×2): qty 1

## 2016-12-16 MED ORDER — IOPAMIDOL (ISOVUE-370) INJECTION 76%
INTRAVENOUS | Status: AC
  Administered 2016-12-16: 100 mL via INTRAVENOUS
  Filled 2016-12-16: qty 50

## 2016-12-16 MED ORDER — STROKE: EARLY STAGES OF RECOVERY BOOK
Freq: Once | Status: AC
  Administered 2016-12-16: 10:00:00
  Filled 2016-12-16: qty 1

## 2016-12-16 MED ORDER — ASPIRIN EC 81 MG PO TBEC
81.0000 mg | DELAYED_RELEASE_TABLET | Freq: Every day | ORAL | Status: DC
Start: 2016-12-17 — End: 2016-12-16

## 2016-12-16 MED ORDER — SODIUM CHLORIDE 0.9 % IV SOLN
30.0000 meq | Freq: Once | INTRAVENOUS | Status: AC
  Administered 2016-12-16: 30 meq via INTRAVENOUS
  Filled 2016-12-16: qty 15

## 2016-12-16 MED ORDER — SENNOSIDES-DOCUSATE SODIUM 8.6-50 MG PO TABS
1.0000 | ORAL_TABLET | Freq: Every evening | ORAL | Status: DC | PRN
  Filled 2016-12-16: qty 1

## 2016-12-16 MED ORDER — SODIUM CHLORIDE 0.9 % IV SOLN
INTRAVENOUS | Status: DC
  Administered 2016-12-16: 07:00:00 via INTRAVENOUS

## 2016-12-16 MED ORDER — NICOTINE 21 MG/24HR TD PT24: 21.0000 mg | MEDICATED_PATCH | Freq: Every day | TRANSDERMAL | Status: DC | PRN

## 2016-12-16 MED ORDER — ASPIRIN 81 MG PO TBEC: 81.0000 mg | DELAYED_RELEASE_TABLET | Freq: Every day | ORAL | 1 refills | Status: DC

## 2016-12-16 MED ORDER — ACETAMINOPHEN 325 MG PO TABS
650.0000 mg | ORAL_TABLET | Freq: Four times a day (QID) | ORAL | Status: DC | PRN
  Administered 2016-12-16: 650 mg via ORAL
  Filled 2016-12-16: qty 2

## 2016-12-16 MED ORDER — EZETIMIBE 10 MG PO TABS
10.0000 mg | ORAL_TABLET | Freq: Every day | ORAL | Status: DC
  Administered 2016-12-16: 10 mg via ORAL

## 2016-12-16 MED ORDER — LORAZEPAM 2 MG/ML IJ SOLN
1.0000 mg | Freq: Once | INTRAMUSCULAR | Status: AC | PRN
  Administered 2016-12-16: 1 mg via INTRAVENOUS
  Filled 2016-12-16: qty 1

## 2016-12-16 MED ORDER — NICOTINE 21 MG/24HR TD PT24: 21.0000 mg | MEDICATED_PATCH | Freq: Every day | TRANSDERMAL | 0 refills | Status: DC

## 2016-12-16 NOTE — Evaluation (Signed)
Occupational Therapy Evaluation Patient Details Name: Danny Kelley MRN: [id number] DOB: September 16, 1966 Today's Date: 12/16/2016    History of Present Illness 51 y.o. male admitted to [facility] on 12/16/16 for right sided weakness and slurred speech.  CT and MRI are negative for acute events.  Further cardiac and stroke workup pending.  TIA suspected.  Pt with significant PMHx of A-fib, obesity, MI, HTN, HA, CHF, CAD, R knee arthroscopy, coronary angioplasty with stent, and cardioversion.   Clinical Impression   PTA, pt was independent with ADL and functional mobility and working full time as a maintenance man. Pt currently requires min assist for dressing tasks due to decreased R UE strength and fine/gross motor coordination. Pt and family report that functional use of R UE is much improved but not to baseline. Pt would benefit from continued OT services while admitted to improve independence with ADL and functional use of R Ue. Initiated HEP for fine/gross motor coordination as well as R UE strengthening with therapy putty and Theraband provided along with handout. Family and pt demonstrate understanding. Do not anticipate need for further OT services post-acute D/C.    Follow Up Recommendations  No OT follow up;Supervision/Assistance - 24 hour    Equipment Recommendations  None recommended by OT    Recommendations for Other Services       Precautions / Restrictions Precautions Precautions: Fall Precaution Comments: pt is mildly unsteady on his feet Restrictions Weight Bearing Restrictions: No      Mobility Bed Mobility Overal bed mobility: Modified Independent             General bed mobility comments: Slow but able to complete without physical assistance.  Transfers Overall transfer level: Needs assistance Equipment used: None Transfers: Sit to/from Stand Sit to Stand: Supervision         General transfer comment: Increased time required and supervision for safety.     Balance Overall balance assessment: Needs assistance Sitting-balance support: Feet supported;No upper extremity supported Sitting balance-Leahy Scale: Good     Standing balance support: No upper extremity supported;During functional activity Standing balance-Leahy Scale: Good Standing balance comment: Able to ambulate without physical assistance but slowly and cautiously.                            ADL Overall ADL's : Needs assistance/impaired Eating/Feeding: Set up;Sitting   Grooming: Supervision/safety;Standing   Upper Body Bathing: Supervision/ safety;Sitting   Lower Body Bathing: Supervison/ safety;Sit to/from stand   Upper Body Dressing : Sitting;Minimal assistance Upper Body Dressing Details (indicate cue type and reason): Min assist for fasteners. Lower Body Dressing: Minimal assistance;Sit to/from stand Lower Body Dressing Details (indicate cue type and reason): Min assist to don R shoe. Toilet Transfer: Supervision/safety;Ambulation;Regular Toilet   Toileting- Water quality scientist and Hygiene: Supervision/safety;Sit to/from stand       Functional mobility during ADLs: Supervision/safety General ADL Comments: Difficulty with R hand coordination and strength impacting ADL independence.     Vision Vision Assessment?: Yes Eye Alignment: Within Functional Limits Ocular Range of Motion: Within Functional Limits Alignment/Gaze Preference: Within Defined Limits Tracking/Visual Pursuits: Able to track stimulus in all quads without difficulty Saccades: Within functional limits Visual Fields: No apparent deficits Additional Comments: Reports some blurred and double vision yesterday but this has resolved.   Perception Perception Perception Tested?: No   Praxis Praxis Praxis tested?: Within functional limits    Pertinent Vitals/Pain Pain Assessment: Faces Faces Pain Scale: Hurts little more Pain  Location: headache Pain Intervention(s): Limited activity  within patient's tolerance;Repositioned;Monitored during session     Hand Dominance Right   Extremity/Trunk Assessment Upper Extremity Assessment Upper Extremity Assessment: RUE deficits/detail RUE Deficits / Details: Decreased strength (overall 4/5) and coordination. Lag with opposition testing and undershooting noted with finger to nose testing. RUE Coordination: decreased fine motor;decreased gross motor   Lower Extremity Assessment Lower Extremity Assessment: Defer to PT evaluation   Cervical / Trunk Assessment Cervical / Trunk Assessment: Normal   Communication Communication Communication: No difficulties   Cognition Arousal/Alertness: Awake/alert Behavior During Therapy: WFL for tasks assessed/performed Overall Cognitive Status: Within Functional Limits for tasks assessed                     General Comments       Exercises Exercises: General Upper Extremity;Hand exercises;Other exercises Other Exercises Other Exercises: Reviewed and demonstrated fine motor coordination HEP including digit lifts, digit composite flexion/extension as well as abduction/adduction, finger to thumb opposition, and functional activities including crafts, beading string, and shuffling cards. Handouts provided.    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        OT Problem List: Decreased strength;Decreased activity tolerance;Impaired balance (sitting and/or standing);Decreased safety  awareness;Decreased coordination;Impaired UE functional use   OT Treatment/Interventions: Self-care/ADL training;Therapeutic exercise;Energy conservation;Therapeutic activities;Patient/family education;Balance training    OT Goals(Current goals can be found in the care plan section) Acute Rehab OT Goals Patient Stated Goal: to return to normal OT Goal Formulation: With patient Time For Goal Achievement: 12/23/16 Potential to Achieve Goals: Good ADL Goals Pt Will Perform Grooming: with modified independence;standing (including gathering items) Pt Will Perform Tub/Shower Transfer: with modified independence;ambulating;Tub transfer Pt/caregiver will Perform Home Exercise Program: Right Upper extremity;Increased strength;Independently;With written HEP provided (R UE strengthening and fine/gross motor coordination.)  OT Frequency: Min 1X/week   Barriers to D/C:            Co-evaluation              End of Session Nurse Communication: Mobility status  Activity Tolerance: Patient tolerated treatment well Patient left: with family/visitor present (In bathroom)   Time: [id number] OT Time Calculation (min): 28 min Charges:  OT General Charges $OT Visit: 1 Procedure OT Evaluation $OT Eval Moderate Complexity: 1 Procedure OT Treatments $Therapeutic Exercise: 8-22 mins G-Codes: OT G-codes **NOT FOR INPATIENT CLASS** Functional Assessment Tool Used: clinical judgement Functional Limitation: Self care Self Care Current Status [id number]): At least 1 percent but less than 20 percent impaired, limited or restricted Self Care Goal Status [id number]): 0 percent impaired, limited or restricted  Norman Herrlich, [place] OTR/L  Pager: [place] A Kristen Fromm 12/16/2016, 5:37 PM

## 2016-12-16 NOTE — ED Triage Notes (Signed)
Pt presents from home with [facility] for stroke like symptoms including slurred speech, double vision, headache, and tingling to the R side extremties; Wife at bedside states that the symptoms began at 0220am and immediately called 911

## 2016-12-16 NOTE — H&P (Signed)
History and Physical    Danny Kelley [id number] DOB: [birth year] DOA: 12/16/2016  Referring MD/NP/PA: Dr. Claudine Mouton PCP: No PCP Per Patient  Patient coming from: home via EMS  Chief Complaint: Slurred speech  HPI: Danny Kelley is a 51 y.o. male with medical history significant of  CAD s/p PCI x2 in 2014, pAFs/p ablations on apixaban,  and HTN; who presents with slurred speech and right sided weakness. Symptoms began while the patient was watching television with his wife approximately 2:20 AM. He initially reported having double vision. His wife noted that his speech was slurred and his right hand was shaking. The patient reportedly tried to stand up but was unable. His wife reportedly called EMS immediately. Patient denies having any palpitations, chest pain, shortness of breath, nausea, vomiting, diarrhea, or abdominal pain. Associated symptoms include left-sided headache. At baseline the patient is right handed. Prior to the onset of symptoms tonight wife notes that he was covered in some chemical from work which was unusual for him. Patient unsure of what the chemical was, but his wife question and this had something do with his acute presentation. Of note patient had his office visit at the atrial fibrillation clinic yesterday afternoon and  had underwent ablation with Dr. Rayann Heman on 1/11.  ED Course: Patient presented to the hospital as a code stroke. Initial CT scan of the brain was negative. Patient was not able to receive TPA due to being on Eliquis. TRH called to complete stroke workup.  Review of Systems: As per HPI otherwise 10 point review of systems negative.   Past Medical History:  Diagnosis Date  . Arthritis    "a little in my right knee" (07/14/2016)  . CAD (coronary artery disease)   . Chronic congestive heart failure with left ventricular diastolic dysfunction ([place])   . Complication of anesthesia    Pt reports "they have a hard time waking me up"  . GERD (gastroesophageal  reflux disease)    hx  . Headache    "with every heart issue that I have" (07/14/2016)  . High cholesterol   . History of hiatal hernia 1990s   "fixed it w/scope down my throat"  . Hypertension   . Myocardial infarction 05/2013   stents: left PDA, 1st OM at [facility]  . Obesity   . Obstructive sleep apnea    previous test "inconclusive" per patient,  has appointment with [facility] 10/06/16 for evaluation  . Persistent atrial fibrillation ([place])   . Pneumonia ~ 1992  . Seasonal allergies    "I take Allegra prn" (07/14/2016)    Past Surgical History:  Procedure Laterality Date  . APPENDECTOMY  1984  . CARDIOVERSION N/A 05/27/2016   Procedure: CARDIOVERSION;  Surgeon: [name], MD;  Location[facility] ENDOSCOPY;  Service: Cardiovascular;  Laterality: N/A;  . CARDIOVERSION N/A 06/11/2016   Procedure: CARDIOVERSION;  Surgeon: [name], MD;  Location: [place];  Service: Cardiovascular;  Laterality: N/A;  . CORONARY ANGIOPLASTY WITH STENT PLACEMENT  05/30/2013   "2 stents put in at [facility]" & /stent card  . ELECTROPHYSIOLOGIC STUDY N/A 11/13/2016   Procedure: Atrial Fibrillation Ablation;  Surgeon: [name], MD;  Location: [place] [facility] LAB;  Service: Cardiovascular;  Laterality: N/A;  . ESOPHAGOGASTRODUODENOSCOPY (EGD) WITH ESOPHAGEAL DILATION  1990s X 2  . KNEE ARTHROSCOPY Right 1986; ~ 1995 X 2  . LAPAROSCOPIC CHOLECYSTECTOMY  2015  . REPAIR OF ESOPHAGUS  1998   perforation of the distal esophagus from  bad heartburn  . TEE WITHOUT CARDIOVERSION N/A 11/13/2016   Procedure: TRANSESOPHAGEAL ECHOCARDIOGRAM (TEE);  Surgeon: Thayer Headings, MD;  Location: [facility] ENDOSCOPY;  Service: Cardiovascular;  Laterality: N/A;     reports that he has been smoking Cigarettes and E-cigarettes.  He has a 25.00 pack-year smoking history. He has never used smokeless tobacco. He reports that he does not drink alcohol or use drugs.  Allergies  Allergen Reactions  . Crestor  [Rosuvastatin] Other (See Comments)    Severe leg cramps  . Percocet [Oxycodone-Acetaminophen] Itching, Swelling, Rash and Other (See Comments)    Swelling to face, hands, mouth  . Erythromycin Other (See Comments)    Hallucinations     Family History  Problem Relation Age of Onset  . Heart disease Mother 62    CABG age 68  . Breast cancer Mother   . Tongue cancer Mother   . Diabetes Mother   . Heart attack Father 87    Father had around 7 heart attacks per pt  . Diabetes Father   . Lung cancer Father   . Congestive Heart Failure Father   . Lung cancer Paternal Uncle     Prior to Admission medications   Medication Sig Start Date End Date Taking? Authorizing Provider  acetaminophen (TYLENOL) 500 MG tablet Take 1,000 mg by mouth every 6 (six) hours as needed for headache (pain).    Historical Provider, MD  apixaban (ELIQUIS) 5 MG TABS tablet Take 5 mg by mouth 2 (two) times daily.    Historical Provider, MD  b complex vitamins tablet Take 1 tablet by mouth daily.    Historical Provider, MD  bisoprolol (ZEBETA) 10 MG tablet Take 2 tablets (20 mg total) by mouth daily. 09/09/16   [name], MD  cholecalciferol (VITAMIN D) 1000 units tablet Take 1,000 Units by mouth daily.    Historical Provider, MD  dronedarone (MULTAQ) 400 MG tablet Take 400 mg by mouth 2 (two) times daily with a meal.    Historical Provider, MD  ezetimibe (ZETIA) 10 MG tablet Take 10 mg by mouth daily.    Historical Provider, MD  fexofenadine (ALLEGRA) 180 MG tablet Take 180 mg by mouth daily as needed (seasonal allergies).     Historical Provider, MD  Flaxseed, Linseed, (FLAX SEED OIL PO) Take 2,400 mg by mouth daily.    Historical Provider, MD  Multiple Vitamin (MULTIVITAMIN WITH MINERALS) TABS tablet Take 1 tablet by mouth daily. Centrum    Historical Provider, MD  Omega-3 Fatty Acids (FISH OIL) 1200 MG CAPS Take 1,200 mg by mouth daily.    Historical Provider, MD  phenylephrine-shark liver oil-mineral  oil-petrolatum (PREPARATION H) 0.25-3-14-71.9 % rectal ointment Place 1 application rectally 2 (two) times daily as needed for hemorrhoids.    Historical Provider, MD  shark liver oil-cocoa butter (PREPARATION H) 0.25-3-85.5 % suppository Place 1 suppository rectally 2 (two) times daily as needed for hemorrhoids.    Historical Provider, MD  valsartan-hydrochlorothiazide (DIOVAN HCT) 160-12.5 MG tablet Take 1 tablet by mouth daily. 06/11/16   [name], MD  zolpidem (AMBIEN) 5 MG tablet Take 5 mg by mouth at bedtime.    Historical Provider, MD    Physical Exam:   Constitutional: NAD, calm, comfortable Vitals:   12/16/16 0402 12/16/16 0403 12/16/16 0415 12/16/16 0421  BP: 144/91  (!) 134/102   Pulse: 72  65   Resp: 20  20   Temp: 98.1 F (36.7 C)   98.1 F (36.7 C)  TempSrc:  Oral     SpO2: 96%  95%   Weight:  108.3 kg (238 lb 12.1 oz)    Height:       Eyes: PERRL, lids and conjunctivae normal ENMT: Mucous membranes are moist. Posterior pharynx clear of any exudate or lesions.Normal dentition.  Neck: normal, supple, no masses, no thyromegaly Respiratory: clear to auscultation bilaterally, no wheezing, no crackles. Normal respiratory effort. No accessory muscle use.  Cardiovascular: Regular rate and rhythm, no murmurs / rubs / gallops. No extremity edema. 2+ pedal pulses. No carotid bruits.  Abdomen: no tenderness, no masses palpated. No hepatosplenomegaly. Bowel sounds positive.  Musculoskeletal: no clubbing / cyanosis. No joint deformity upper and lower extremities. Good ROM, no contractures. Normal muscle tone.  Skin: no rashes, lesions, ulcers. No induration Neurologic: CN 2-12 grossly intact. Sensation intact, DTR normal. Strength 4/5 on the right upper and lower extremities. 5 out of 5 on the left upper and lower extremity. Speech is slurred. Psychiatric: Normal judgment and insight. Alert and oriented x 3. Normal mood.     Labs on Admission: I have personally reviewed following  labs and imaging studies  CBC:  Recent Labs Lab 12/16/16 0335 12/16/16 0339  WBC 10.0  --   NEUTROABS 5.4  --   HGB 16.1 16.0  HCT 46.1 47.0  MCV 84.1  --   PLT 243  --    Basic Metabolic Panel:  Recent Labs Lab 12/16/16 0335 12/16/16 0339  NA 135 139  K 3.3* 3.3*  CL 103 103  CO2 23  --   GLUCOSE 123* 119*  BUN 13 15  CREATININE 0.90 0.80  CALCIUM 9.1  --    GFR: Estimated Creatinine Clearance: 139.1 mL/min (by C-G formula based on SCr of 0.8 mg/dL). Liver Function Tests:  Recent Labs Lab 12/16/16 0335  AST 25  ALT 26  ALKPHOS 88  BILITOT 0.2*  PROT 6.7  ALBUMIN 3.3*   No results for input(s): LIPASE, AMYLASE in the last 168 hours. No results for input(s): AMMONIA in the last 168 hours. Coagulation Profile:  Recent Labs Lab 12/16/16 0335  INR 1.02   Cardiac Enzymes: No results for input(s): CKTOTAL, CKMB, CKMBINDEX, TROPONINI in the last 168 hours. BNP (last 3 results) No results for input(s): PROBNP in the last 8760 hours. HbA1C: No results for input(s): HGBA1C in the last 72 hours. CBG: No results for input(s): GLUCAP in the last 168 hours. Lipid Profile: No results for input(s): CHOL, HDL, LDLCALC, TRIG, CHOLHDL, LDLDIRECT in the last 72 hours. Thyroid Function Tests: No results for input(s): TSH, T4TOTAL, FREET4, T3FREE, THYROIDAB in the last 72 hours. Anemia Panel: No results for input(s): VITAMINB12, FOLATE, FERRITIN, TIBC, IRON, RETICCTPCT in the last 72 hours. Urine analysis:    Component Value Date/Time   COLORURINE YELLOW 07/14/2016 [place] 07/14/2016 1835   LABSPEC 1.010 07/14/2016 1835   PHURINE 6.0 07/14/2016 1835   GLUCOSEU NEGATIVE 07/14/2016 1835   HGBUR NEGATIVE 07/14/2016 1835   BILIRUBINUR NEGATIVE 07/14/2016 1835   KETONESUR NEGATIVE 07/14/2016 1835   PROTEINUR NEGATIVE 07/14/2016 1835   NITRITE NEGATIVE 07/14/2016 1835   LEUKOCYTESUR NEGATIVE 07/14/2016 1835   Sepsis Labs: No results found for  this or any previous visit (from the past 240 hour(s)).   Radiological Exams on Admission: Ct Angio Head W Or Wo Contrast  Result Date: 12/16/2016 CLINICAL DATA:  LEFT upper extremity tingling. History of hypertension, hypercholesterolemia and atrial verbally. EXAM: CT ANGIOGRAPHY HEAD AND NECK TECHNIQUE: Multidetector  CT imaging of the head and neck was performed using the standard protocol during bolus administration of intravenous contrast. Multiplanar CT image reconstructions and MIPs were obtained to evaluate the vascular anatomy. Carotid stenosis measurements (when applicable) are obtained utilizing NASCET criteria, using the distal internal carotid diameter as the denominator. CONTRAST:  100 cc Isovue 370 COMPARISON:  CT HEAD December 16, 2016 at 0339 hours FINDINGS: CTA NECK AORTIC ARCH: Normal appearance of the thoracic arch, normal branch pattern. The origins of the innominate, left Common carotid artery and subclavian artery are widely patent. RIGHT CAROTID SYSTEM: Common carotid artery is widely patent, coursing in a straight line fashion. Normal appearance of the carotid bifurcation without hemodynamically significant stenosis by NASCET criteria. Normal appearance of the included internal carotid artery. LEFT CAROTID SYSTEM: Common carotid artery is widely patent, coursing in a straight line fashion. Normal appearance of the carotid bifurcation without hemodynamically significant stenosis by NASCET criteria. Normal appearance of the included internal carotid artery. VERTEBRAL ARTERIES:LEFT vertebral artery arises directly from the aortic arch, normal variant . RIGHT vertebral artery is dominant. Trace calcific atherosclerosis P1 segment. Normal appearance of the vertebral arteries, which appear widely patent. SKELETON: No acute osseous process though bone windows have not been submitted. Multiple predominately LEFT maxillary dental caries and periapical lucency/abscess. OTHER NECK: Soft tissues of  the neck are non-acute though, not tailored for evaluation. CTA HEAD ANTERIOR CIRCULATION: Normal appearance of the cervical internal carotid arteries, petrous, cavernous and supra clinoid internal carotid arteries. Widely patent anterior communicating artery. Patent anterior cerebral arteries, mild luminal regularity distal RIGHT callosum marginal ACA. Moderate stenosis LEFT M2 origin, inferior segment. Moderate tandem stenoses LEFT MCA. No large vessel occlusion, hemodynamically significant stenosis, dissection, contrast extravasation or aneurysm. POSTERIOR CIRCULATION: LEFT vertebral artery predominantly terminate in the posterior inferior cerebellar artery. Moderate luminal irregularity of the basilar artery which remains patent. Patent vertebrobasilar junction and basilar artery, as well as main branch vessels. Moderate tandem stenoses RIGHT greater than LEFT posterior cerebral arteries. No large vessel occlusion, hemodynamically significant stenosis, dissection, contrast extravasation or aneurysm. VENOUS SINUSES: Major dural venous sinuses are patent though not tailored for evaluation on this angiographic examination. ANATOMIC VARIANTS: None. DELAYED PHASE: Not performed. IMPRESSION: CTA NECK: No hemodynamically significant stenosis or acute vascular process in the neck. CTA HEAD: No emergent large vessel occlusion or high-grade stenosis. Multifocal moderate stenoses LEFT MCA, bilateral PCA's compatible with atherosclerosis. Critical Value/emergent results were called by telephone at the time of interpretation on 12/16/2016 at 4:00 am to Dr. Roland Rack , who verbally acknowledged these results. Electronically Signed   By: Elon Alas M.D.   On: 12/16/2016 04:19   Ct Angio Neck W Or Wo Contrast  Result Date: 12/16/2016 CLINICAL DATA:  LEFT upper extremity tingling. History of hypertension, hypercholesterolemia and atrial verbally. EXAM: CT ANGIOGRAPHY HEAD AND NECK TECHNIQUE: Multidetector CT  imaging of the head and neck was performed using the standard protocol during bolus administration of intravenous contrast. Multiplanar CT image reconstructions and MIPs were obtained to evaluate the vascular anatomy. Carotid stenosis measurements (when applicable) are obtained utilizing NASCET criteria, using the distal internal carotid diameter as the denominator. CONTRAST:  100 cc Isovue 370 COMPARISON:  CT HEAD December 16, 2016 at 0339 hours FINDINGS: CTA NECK AORTIC ARCH: Normal appearance of the thoracic arch, normal branch pattern. The origins of the innominate, left Common carotid artery and subclavian artery are widely patent. RIGHT CAROTID SYSTEM: Common carotid artery is widely patent, coursing in a straight line  fashion. Normal appearance of the carotid bifurcation without hemodynamically significant stenosis by NASCET criteria. Normal appearance of the included internal carotid artery. LEFT CAROTID SYSTEM: Common carotid artery is widely patent, coursing in a straight line fashion. Normal appearance of the carotid bifurcation without hemodynamically significant stenosis by NASCET criteria. Normal appearance of the included internal carotid artery. VERTEBRAL ARTERIES:LEFT vertebral artery arises directly from the aortic arch, normal variant . RIGHT vertebral artery is dominant. Trace calcific atherosclerosis P1 segment. Normal appearance of the vertebral arteries, which appear widely patent. SKELETON: No acute osseous process though bone windows have not been submitted. Multiple predominately LEFT maxillary dental caries and periapical lucency/abscess. OTHER NECK: Soft tissues of the neck are non-acute though, not tailored for evaluation. CTA HEAD ANTERIOR CIRCULATION: Normal appearance of the cervical internal carotid arteries, petrous, cavernous and supra clinoid internal carotid arteries. Widely patent anterior communicating artery. Patent anterior cerebral arteries, mild luminal regularity distal  RIGHT callosum marginal ACA. Moderate stenosis LEFT M2 origin, inferior segment. Moderate tandem stenoses LEFT MCA. No large vessel occlusion, hemodynamically significant stenosis, dissection, contrast extravasation or aneurysm. POSTERIOR CIRCULATION: LEFT vertebral artery predominantly terminate in the posterior inferior cerebellar artery. Moderate luminal irregularity of the basilar artery which remains patent. Patent vertebrobasilar junction and basilar artery, as well as main branch vessels. Moderate tandem stenoses RIGHT greater than LEFT posterior cerebral arteries. No large vessel occlusion, hemodynamically significant stenosis, dissection, contrast extravasation or aneurysm. VENOUS SINUSES: Major dural venous sinuses are patent though not tailored for evaluation on this angiographic examination. ANATOMIC VARIANTS: None. DELAYED PHASE: Not performed. IMPRESSION: CTA NECK: No hemodynamically significant stenosis or acute vascular process in the neck. CTA HEAD: No emergent large vessel occlusion or high-grade stenosis. Multifocal moderate stenoses LEFT MCA, bilateral PCA's compatible with atherosclerosis. Critical Value/emergent results were called by telephone at the time of interpretation on 12/16/2016 at 4:00 am to Dr. Roland Rack , who verbally acknowledged these results. Electronically Signed   By: Elon Alas M.D.   On: 12/16/2016 04:19   Ct Head Code Stroke W/o Cm  Result Date: 12/16/2016 CLINICAL DATA:  Code stroke. LEFT upper extremity arm tingling. History of hypertension, hypercholesterolemia, atrial fibrillation. EXAM: CT HEAD WITHOUT CONTRAST TECHNIQUE: Contiguous axial images were obtained from the base of the skull through the vertex without intravenous contrast. COMPARISON:  CT HEAD May 27, 2013 FINDINGS: BRAIN: The ventricles and sulci are normal. No intraparenchymal hemorrhage, mass effect nor midline shift. No acute large vascular territory infarcts. No abnormal extra-axial  fluid collections. Basal cisterns are patent. VASCULAR: Trace calcific atherosclerosis of the carotid siphons. Dense arterial and venous structures compatible with hemoconcentration. SKULL/SOFT TISSUES: No skull fracture. No significant soft tissue swelling. ORBITS/SINUSES: The included ocular globes and orbital contents are normal.The mastoid aircells and included paranasal sinuses are well-aerated. OTHER: None. ASPECTS [facility] Stroke Program Early CT Score) - Ganglionic level infarction (caudate, lentiform nuclei, internal capsule, insula, M1-M3 cortex): 7 - Supraganglionic infarction (M4-M6 cortex): 3 Total score (0-10 with 10 being normal): 10 IMPRESSION: 1. Negative CT HEAD. 2. ASPECTS is 10. 3. Critical Value/emergent results were called by telephone at the time of interpretation on 12/16/2016 at 3:45 am to Dr. Leonel Ramsay, Neurology who verbally acknowledged these results. Electronically Signed   By: Elon Alas M.D.   On: 12/16/2016 03:48    EKG: Independently reviewed. Sinus rhythm with PVC  Assessment/Plan Suspected CVA: Acute. Patient presents with slurred speech and right-sided upper and lower extremity weakness. Initial CT scan of the brain showed no acute  abnormalities. - Admit to telemetry bed - Stroke order set initiated   - Neuro checks  - Check MRI/MRA, echocardiogram, carotid doppler U/S - Check hemoglobin A1c and lipid panel in a.m. - PT/OT/Sp to eval and treat - Allowing for permissive hypertension - ASA - Appreciate neurology consultative services    Dysarthria and right-sided weakness - Treatment as seen above  Paroxysmal atrial fibrillation on Eliquis: Chadsvasc score= >2.  - Continue Eliquis  Hypokalemia: Acute. Initial potassium 3.3.  - Potassium chloride 30 mEq IV 1 dose now  - Continue to monitor and replace as needed   Essential hypertension - Allowing for permissive hypertension - restart home bp medications when advised  Tobacco abuse: Patient  reports continued tobacco use. - Counseled on need of cessation of tobacco - nicotine patch offered  DVT prophylaxis:  On Elqiuis Code Status: Full Family Communication: Discussed  plan of care with the patient  Disposition Plan: TBD Consults called: Neurology Admission status: observation  Norval Morton MD [facility] Pager [phone]  If 7PM-7AM, please contact night-coverage [email or web address] Password [email or web address]  12/16/2016, 4:30 AM

## 2016-12-16 NOTE — ED Notes (Signed)
Patient transported to MRI with transport.

## 2016-12-16 NOTE — ED Notes (Signed)
Pt returned to ER from MRI at this time; will continue to monitor; will transport to floor after shift change blackout time

## 2016-12-16 NOTE — Care Management Note (Signed)
Case Management Note  Patient Details  Name: EFTON WINCHELL MRN: [id number] Date of Birth: 1966/07/29  Subjective/Objective:                    Action/Plan: Pt discharging home with self care and family. No f/u per PT/OT and no DME needs. Pt has insurance and does have a PCP: [facility]. No further needs per CM.   Expected Discharge Date:  12/16/16               Expected Discharge Plan:  Home/Self Care  In-House Referral:     Discharge planning Services     Post Acute Care Choice:    Choice offered to:     DME Arranged:    DME Agency:     HH Arranged:    HH Agency:     Status of Service:  Completed, signed off  If discussed at [employer] of Stay Meetings, dates discussed:    Additional Comments:  Pollie Friar, RN 12/16/2016, 2:51 PM

## 2016-12-16 NOTE — Progress Notes (Signed)
STROKE TEAM PROGRESS NOTE   HISTORY OF PRESENT ILLNESS (per record) Danny Kelley is a 51 y.o. male with a history of atrial fibrillation and coronary artery disease who presents with aphasia and right-sided weakness. His wife states that they're watching TV when just before 2:30 AM 12/16/2016  (LKW) she notes that he was slurring his speech. He is brought into the emergency room as a code stroke where he was found to have some right-sided weakness and aphasia. CT was done which did not reveal any hemorrhage and a CTA was performed which did not demonstrate any large vessel occlusion. Patient was not administered IV t-PA secondary to being anticoagulated. He was admitted for further evaluation and treatment.   SUBJECTIVE (INTERVAL HISTORY) Son, daughter and wife are at bedside. Pt stated that his right sided weakness improved but not at baseline. Speech better but sometimes hesitant. He had recent AV ablation by Dr. Rayann Heman, follows with Dr. Einar Gip for afib, on eliquis. Still smoking just PTA. HLD much improved on zetia, not on any PCSK9 inhibitors yet.    OBJECTIVE Temp:  [97.7 F (36.5 C)-98.1 F (36.7 C)] 97.7 F (36.5 C) (02/13 0745) Pulse Rate:  [58-72] 58 (02/13 0745) Cardiac Rhythm: Normal sinus rhythm (02/13 0757) Resp:  [14-21] 20 (02/13 0745) BP: (122-157)/(76-104) 147/104 (02/13 0745) SpO2:  [91 %-98 %] 98 % (02/13 0745) Weight:  [108.3 kg (238 lb 12.1 oz)-111.2 kg (245 lb 3.2 oz)] 108.3 kg (238 lb 12.1 oz) (02/13 0403)  CBC:  Recent Labs Lab 12/16/16 0335 12/16/16 0339  WBC 10.0  --   NEUTROABS 5.4  --   HGB 16.1 16.0  HCT 46.1 47.0  MCV 84.1  --   PLT 243  --     Basic Metabolic Panel:  Recent Labs Lab 12/16/16 0335 12/16/16 0339  NA 135 139  K 3.3* 3.3*  CL 103 103  CO2 23  --   GLUCOSE 123* 119*  BUN 13 15  CREATININE 0.90 0.80  CALCIUM 9.1  --     Lipid Panel:    Component Value Date/Time   CHOL 230 (H) 12/16/2016 0500   TRIG 175 (H) 12/16/2016 0500    HDL 54 12/16/2016 0500   CHOLHDL 4.3 12/16/2016 0500   VLDL 35 12/16/2016 0500   LDLCALC 141 (H) 12/16/2016 0500   HgbA1c: No results found for: HGBA1C Urine Drug Screen: No results found for: LABOPIA, COCAINSCRNUR, LABBENZ, AMPHETMU, THCU, LABBARB    IMAGING I have personally reviewed the radiological images below and agree with the radiology interpretations.  Ct Head Code Stroke W/o Cm 12/16/2016 1. Negative CT HEAD. 2. ASPECTS is 10.   Ct Angio Head W Or Wo Contrast 12/16/2016 No emergent large vessel occlusion or high-grade stenosis. Multifocal moderate stenoses LEFT MCA, bilateral PCA's compatible with atherosclerosis.   Ct Angio Neck W Or Wo Contrast 12/16/2016 No hemodynamically significant stenosis or acute vascular process in the neck.   Mr Brain 43 Contrast Mr Jodene Nam Head/brain Wo Cm 12/16/2016 Normal appearance of the brain itself. No evidence of old or acute insult. Normal intracranial MR angiography of the large and medium size vessels. The distal left vertebral artery is diminutive, which I favor as being congenital rather than acquired.   2-D echocardiogram - Left ventricle: The cavity size was normal. There was moderate concentric hypertrophy. Systolic function was normal. The estimated ejection fraction was in the range of 55% to 60%. Wall motion was normal; there were no regional wall motion abnormalities. Left  ventricular diastolic function parameters were normal. - Aortic valve: Trileaflet; normal thickness, mildly calcified leaflets. - Left atrium: The atrium was mildly dilated. Anterior-posterior dimension: 44 mm.   PHYSICAL EXAM  Temp:  [97.7 F (36.5 C)-98.1 F (36.7 C)] 97.7 F (36.5 C) (02/13 0745) Pulse Rate:  [58-72] 58 (02/13 0745) Resp:  [14-21] 20 (02/13 0745) BP: (122-157)/(76-104) 147/104 (02/13 0745) SpO2:  [91 %-98 %] 98 % (02/13 0745) Weight:  [238 lb 12.1 oz (108.3 kg)-245 lb 3.2 oz (111.2 kg)] 238 lb 12.1 oz (108.3 kg) (02/13  0403)  General - Well nourished, well developed, in no apparent distress.  Ophthalmologic - Fundi not visualized due to eye movement.  Cardiovascular - Regular rate and rhythm.  Mental Status -  Level of arousal and orientation to time, place, and person were intact. Language including expression, naming, repetition, comprehension was assessed and found intact. No aphasia or neglect.  Fund of Knowledge was assessed and was intact.  Cranial Nerves II - XII - II - Visual field intact OU. III, IV, VI - Extraocular movements intact. V - Facial sensation intact bilaterally. VII - Facial movement intact bilaterally. VIII - Hearing & vestibular intact bilaterally. X - Palate elevates symmetrically. XI - Chin turning & shoulder shrug intact bilaterally. XII - Tongue protrusion intact.  Motor Strength - The patient's strength was normal in all extremities except mild giveaway weakness on the RUE and RLE and pronator drift was absent.  Bulk was normal and fasciculations were absent.   Motor Tone - Muscle tone was assessed at the neck and appendages and was normal.  Reflexes - The patient's reflexes were 1+ in all extremities and he had no pathological reflexes.  Sensory - Light touch, temperature/pinprick were assessed and were symmetrical except subjective RUE decreased sensation about 80% of the left.    Coordination - The patient had normal movements in the hands with no ataxia or dysmetria.  Tremor was absent.  Gait and Station - not tested   ASSESSMENT/PLAN Mr. Danny Kelley is a 51 y.o. male with history of A. fib on anticoagulation and coronary artery disease presenting with right-sided weakness and aphasia. He did not receive IV t-PA due to being on anticoagulation.   L brain TIA - risk factor including afib, HTN, HLD, smoking, and OSA  Code stroke CT negative. Aspects = 10   CTA head no LVO. Moderate stenosis L MCA, B PCAs  CTA neck no LVO  MRI  normal.  no stroke  MRA   no significant stenosis  2D Echo  EF 55-60% with no source of embolus  LDL 141, follows with cardiology, much improved with zetia as per pt  HgbA1c pending  Apixaban for VTE prophylaxis  Diet Heart Room service appropriate? Yes; Fluid consistency: Thin  Eliquis (apixaban) daily prior to admission, now on Eliquis (apixaban) daily and ASA 325mg  daily. Agree to continue ASA on top of eliquis for better stroke prevention (both afib and small vessel disease), but recommend to decrease ASA to 81mg  to avoid bleeding side effect.    Patient counseled to be compliant with his antithrombotic medications  Ongoing aggressive stroke risk factor management  Therapy recommendations:  No PT  Disposition:  Return hom  Atrial Fibrillation  Home anticoagulation:  Eliquis (apixaban) daily continued in the hospital  Continue eliquis on discharge  Follows with Dr. Einar Gip and Dr. Rayann Heman    Hypertension  Stable  Permissive hypertension (OK if <180/105) for 24-48 hours post stroke and then gradually  normalized within 5-7 days.  BP goal normotensive  Hyperlipidemia  Home meds:  Fish oil and zetia, allergic to statins  LDL 141, as per pt, much improved from before  Continue zetia  Follows with Dr. Einar Gip  Consider PCSK9 inhibitor in the future  Tobacco abuse  Current smoker  Smoking cessation counseling provided  Nicotine patch provided  Pt is willing to quit  OSA  Symptom and sign of OSA at home  Has been referred to sleep study by Dr. Einar Gip  Compliance with appointment  Other Stroke Risk Factors  Obesity, Body mass index is 34.26 kg/m., recommend weight loss, diet and exercise as appropriate   Coronary artery disease s/p stents  Chronic congestive heart failure with left ventricular diastolic dysfunction  Hospital day # 0  Neurology will sign off. Please call with questions. Pt will follow up with Cecille Rubin NP at [facility] in about 6 weeks. Thanks for the  consult.  Rosalin Hawking, MD PhD Stroke Neurology 12/16/2016 1:49 PM   To contact Stroke Continuity provider, please refer to [email or web address] After hours, contact General Neurology

## 2016-12-16 NOTE — Discharge Instructions (Addendum)
Follow with PCP in 1-2 weeks  Please get a complete blood count and chemistry panel checked by your Primary MD at your next visit, and again as instructed by your Primary MD. Please get your medications reviewed and adjusted by your Primary MD.  Please request your Primary MD to go over all Hospital Tests and Procedure/Radiological results at the follow up, please get all Hospital records sent to your Prim MD by signing hospital release before you go home.  If you had Pneumonia of Lung problems at the Hospital: Please get a 2 view Chest X ray done in 6-8 weeks after hospital discharge or sooner if instructed by your Primary MD.  If you have Congestive Heart Failure: Please call your Cardiologist or Primary MD anytime you have any of the following symptoms:  1) 3 pound weight gain in 24 hours or 5 pounds in 1 week  2) shortness of breath, with or without a dry hacking cough  3) swelling in the hands, feet or stomach  4) if you have to sleep on extra pillows at night in order to breathe  Follow cardiac low salt diet and 1.5 lit/day fluid restriction.  If you have diabetes Accuchecks 4 times/day, Once in AM empty stomach and then before each meal. Log in all results and show them to your primary doctor at your next visit. If any glucose reading is under 80 or above 300 call your primary MD immediately.  If you have Seizure/Convulsions/Epilepsy: Please do not drive, operate heavy machinery, participate in activities at heights or participate in high speed sports until you have seen by Primary MD or a Neurologist and advised to do so again.  If you had Gastrointestinal Bleeding: Please ask your Primary MD to check a complete blood count within one week of discharge or at your next visit. Your endoscopic/colonoscopic biopsies that are pending at the time of discharge, will also need to followed by your Primary MD.  Get Medicines reviewed and adjusted. Please take all your medications with you  for your next visit with your Primary MD  Please request your Primary MD to go over all hospital tests and procedure/radiological results at the follow up, please ask your Primary MD to get all Hospital records sent to his/her office.  If you experience worsening of your admission symptoms, develop shortness of breath, life threatening emergency, suicidal or homicidal thoughts you must seek medical attention immediately by calling 911 or calling your MD immediately  if symptoms less severe.  You must read complete instructions/literature along with all the possible adverse reactions/side effects for all the Medicines you take and that have been prescribed to you. Take any new Medicines after you have completely understood and accpet all the possible adverse reactions/side effects.   Do not drive or operate heavy machinery when taking Pain medications.   Do not take more than prescribed Pain, Sleep and Anxiety Medications  Special Instructions: If you have smoked or chewed Tobacco  in the last 2 yrs please stop smoking, stop any regular Alcohol  and or any Recreational drug use.  Wear Seat belts while driving.  Please note You were cared for by a hospitalist during your hospital stay. If you have any questions about your discharge medications or the care you received while you were in the hospital after you are discharged, you can call the unit and asked to speak with the hospitalist on call if the hospitalist that took care of you is not available. Once you are  discharged, your primary care physician will handle any further medical issues. Please note that NO REFILLS for any discharge medications will be authorized once you are discharged, as it is imperative that you return to your primary care physician (or establish a relationship with a primary care physician if you do not have one) for your aftercare needs so that they can reassess your need for medications and monitor your lab values.  You  can reach the hospitalist office at phone [phone] or fax [phone]   If you do not have a primary care physician, you can call [phone] for a physician referral.  Activity: As tolerated with Full fall precautions use walker/cane & assistance as needed  Diet: heart healthy  Disposition Home     Information on my medicine - ELIQUIS (apixaban)  This medication education was reviewed with me or my healthcare representative as part of my discharge preparation.  The pharmacist that spoke with me during my hospital stay was:  Wayland Salinas, [facility]  Why was Eliquis prescribed for you? Eliquis was prescribed for you to reduce the risk of a blood clot forming that can cause a stroke if you have a medical condition called atrial fibrillation (a type of irregular heartbeat).  What do You need to know about Eliquis ? Take your Eliquis TWICE DAILY - one tablet in the morning and one tablet in the evening with or without food. If you have difficulty swallowing the tablet whole please discuss with your pharmacist how to take the medication safely.  Take Eliquis exactly as prescribed by your doctor and DO NOT stop taking Eliquis without talking to the doctor who prescribed the medication.  Stopping may increase your risk of developing a stroke.  Refill your prescription before you run out.  After discharge, you should have regular check-up appointments with your healthcare provider that is prescribing your Eliquis.  In the future your dose may need to be changed if your kidney function or weight changes by a significant amount or as you get older.  What do you do if you miss a dose? If you miss a dose, take it as soon as you remember on the same day and resume taking twice daily.  Do not take more than one dose of ELIQUIS at the same time to make up a missed dose.  Important Safety Information A possible side effect of Eliquis is bleeding. You should call your healthcare  provider right away if you experience any of the following: ? Bleeding from an injury or your nose that does not stop. ? Unusual colored urine (red or dark brown) or unusual colored stools (red or black). ? Unusual bruising for unknown reasons. ? A serious fall or if you hit your head (even if there is no bleeding).  Some medicines may interact with Eliquis and might increase your risk of bleeding or clotting while on Eliquis. To help avoid this, consult your healthcare provider or pharmacist prior to using any new prescription or non-prescription medications, including herbals, vitamins, non-steroidal anti-inflammatory drugs (NSAIDs) and supplements.  This website has more information on Eliquis (apixaban): http://www.eliquis.com/eliquis/home

## 2016-12-16 NOTE — Progress Notes (Signed)
  Echocardiogram 2D Echocardiogram has been performed.  Tresa Res 12/16/2016, 11:15 AM

## 2016-12-16 NOTE — Progress Notes (Signed)
Patient seen and examined this morning, admitted by Dr. Tamala Julian overnight, H&P reviewed and agree with the assessment and plan. In brief, this is a 51 year old gentleman with history of coronary artery disease, paroxysmal atrial fibrillation, hypertension, who presented with right-sided weakness as well as slurred speech. Symptoms started all of a sudden around 2 AM while he was watching TV. He was brought as a code stroke.    TIA - Right-sided weakness and slurred speech, he still symptomatic on my evaluation this morning, however MRI of the brain was negative. Complete TIA workup with echocardiogram, carotids, PT OT. Neurology was consulted, and stroke team will evaluate patient today. - ?, Acute migraine given significant headache prior to his symptoms  Paroxysmal A. Fib - Continue Apixaban - He is status post radiofrequency catheter ablation in January 2018 by Dr. Rayann Heman  Tobacco abuse - Counseled for cessation  Hyperlipidemia - His LDL is elevated, however has intolerance to statin with severe leg cramps  Hypertension - Hold home antihypertensives for now   Dillan Candela M. Cruzita Lederer, MD [facility] [phone]  If 7PM-7AM, please contact night-coverage [email or web address] Password [facility]  12/12/2016, 5:37 AM

## 2016-12-16 NOTE — Discharge Summary (Addendum)
Physician Discharge Summary  Danny Kelley [id number] DOB: [birth year] DOA: 12/16/2016  PCP: No PCP Per Patient  Admit date: 12/16/2016 Discharge date: 12/29/2016  Admitted From: home Disposition:  home  Recommendations for Outpatient Follow-up:  1. Follow up with PCP in 1-2 weeks 2. Follow up with Dr. Leonie Man in 2 months  Home Health: none Equipment/Devices: none  Discharge Condition: stable CODE STATUS: Full Diet recommendation: heart healthy  HPI: Per Dr. Tamala Julian, MARKJOSEPH CHEE is a 51 y.o. male with medical history significant of  CAD s/p PCI x2 in 2014, pAFs/p ablations on apixaban,  and HTN; who presents with slurred speech and right sided weakness. Symptoms began while the patient was watching television with his wife approximately 2:20 AM. He initially reported having double vision. His wife noted that his speech was slurred and his right hand was shaking. The patient reportedly tried to stand up but was unable. His wife reportedly called EMS immediately. Patient denies having any palpitations, chest pain, shortness of breath, nausea, vomiting, diarrhea, or abdominal pain. Associated symptoms include left-sided headache. At baseline the patient is right handed. Prior to the onset of symptoms tonight wife notes that he was covered in some chemical from work which was unusual for him. Patient unsure of what the chemical was, but his wife question and this had something do with his acute presentation. Of note patient had his office visit at the atrial fibrillation clinic yesterday afternoon and  had underwent ablation with Dr. Rayann Heman on 1/11.  Hospital Course: Discharge Diagnoses:  Active Problems:   Paroxysmal atrial fibrillation (HCC)   Essential hypertension   Tobacco abuse   Hypokalemia   Mixed hyperlipidemia   OSA (obstructive sleep apnea)   Morbid obesity (HCC)   Hemispheric carotid artery syndrome   TIA - patient was admitted to the hospital with right-sided weakness  and slurred speech. Neurology was consulted while hospitalized. He underwent an MRI which was negative for CVA. 2D echo was with normal EF, no WMA and no diastolic dysfunction. PT evaluated patient without need for follow up therapy after discharge. He improved, was discharged home in stable condition with close outpatient follow up. Per Neurology, will add Aspirin 81 mg to his Eliquis.  Paroxysmal A. Fib - Continue Apixaban, he is status post radiofrequency catheter ablation in January 2018 by Dr. Rayann Heman. Now in sinus Tobacco abuse - Counseled for cessation, nicotine prescription on d/c  Hyperlipidemia - His LDL is elevated, however has intolerance to statin with severe leg cramps. Continue Zetia. Hypertension - resume home medications.    Discharge Instructions  Discharge Instructions    Ambulatory referral to Neurology    Complete by:  As directed    Follow up with NP Cecille Rubin at [facility] in about 2 months. Thanks.     Allergies as of 12/16/2016      Reactions   Crestor [rosuvastatin] Other (See Comments)   Severe leg cramps   Percocet [oxycodone-acetaminophen] Itching, Swelling, Rash, Other (See Comments)   Swelling to face, hands, mouth   Erythromycin Other (See Comments)   Hallucinations      Medication List    TAKE these medications   acetaminophen 500 MG tablet Commonly known as:  TYLENOL Take 1,000 mg by mouth every 6 (six) hours as needed for headache (pain).   aspirin 81 MG EC tablet Take 1 tablet (81 mg total) by mouth daily.   b complex vitamins tablet Take 1 tablet by mouth daily.   bisoprolol 10 MG  tablet Commonly known as:  ZEBETA Take 2 tablets (20 mg total) by mouth daily.   cholecalciferol 1000 units tablet Commonly known as:  VITAMIN D Take 1,000 Units by mouth daily.   dronedarone 400 MG tablet Commonly known as:  MULTAQ Take 400 mg by mouth 2 (two) times daily with a meal.   ELIQUIS 5 MG Tabs tablet Generic drug:  apixaban Take 5 mg by mouth 2  (two) times daily.   ezetimibe 10 MG tablet Commonly known as:  ZETIA Take 10 mg by mouth daily.   fexofenadine 180 MG tablet Commonly known as:  ALLEGRA Take 180 mg by mouth daily as needed (seasonal allergies).   Fish Oil 1200 MG Caps Take 1,200 mg by mouth daily.   FLAX SEED OIL PO Take 2,400 mg by mouth daily.   multivitamin with minerals Tabs tablet Take 1 tablet by mouth daily. Centrum   nicotine 21 mg/24hr patch Commonly known as:  NICODERM CQ - dosed in mg/24 hours Place 1 patch (21 mg total) onto the skin daily.   phenylephrine-shark liver oil-mineral oil-petrolatum 0.25-3-14-71.9 % rectal ointment Commonly known as:  PREPARATION H Place 1 application rectally 2 (two) times daily as needed for hemorrhoids.   shark liver oil-cocoa butter 0.25-3-85.5 % suppository Commonly known as:  PREPARATION H Place 1 suppository rectally 2 (two) times daily as needed for hemorrhoids.   valsartan-hydrochlorothiazide 160-12.5 MG tablet Commonly known as:  DIOVAN HCT Take 1 tablet by mouth daily.   zolpidem 5 MG tablet Commonly known as:  AMBIEN Take 5 mg by mouth at bedtime.      Follow-up Information    Dennie Bible, NP. Schedule an appointment as soon as possible for a visit in 6 week(s).   Specialty:  Family Medicine Contact information: [address] [place] [address] [phone]        Antony Contras, MD. Schedule an appointment as soon as possible for a visit in 2 month(s).   Specialties:  Neurology, Radiology Contact information: [address] [place] [address] [phone]          Allergies  Allergen Reactions  . Crestor [Rosuvastatin] Other (See Comments)    Severe leg cramps  . Percocet [Oxycodone-Acetaminophen] Itching, Swelling, Rash and Other (See Comments)    Swelling to face, hands, mouth  . Erythromycin Other (See Comments)    Hallucinations     Consultations:  Neurology    Procedures/Studies:  2D echo  Study Conclusions - Left ventricle: The cavity size was normal. There was moderate concentric hypertrophy. Systolic function was normal. The estimated ejection fraction was in the range of 55% to 60%. Wall motion was normal; there were no regional wall motion abnormalities. Left ventricular diastolic function parameters were normal. - Aortic valve: Trileaflet; normal thickness, mildly calcified leaflets. - Left atrium: The atrium was mildly dilated. Anterior-posterior dimension: 44 mm.  Ct Angio Head W Or Wo Contrast  Result Date: 12/16/2016 CLINICAL DATA:  LEFT upper extremity tingling. History of hypertension, hypercholesterolemia and atrial verbally. EXAM: CT ANGIOGRAPHY HEAD AND NECK TECHNIQUE: Multidetector CT imaging of the head and neck was performed using the standard protocol during bolus administration of intravenous contrast. Multiplanar CT image reconstructions and MIPs were obtained to evaluate the vascular anatomy. Carotid stenosis measurements (when applicable) are obtained utilizing NASCET criteria, using the distal internal carotid diameter as the denominator. CONTRAST:  100 cc Isovue 370 COMPARISON:  CT HEAD December 16, 2016 at 0339 hours FINDINGS: CTA NECK AORTIC ARCH:  Normal appearance of the thoracic arch, normal branch pattern. The origins of the innominate, left Common carotid artery and subclavian artery are widely patent. RIGHT CAROTID SYSTEM: Common carotid artery is widely patent, coursing in a straight line fashion. Normal appearance of the carotid bifurcation without hemodynamically significant stenosis by NASCET criteria. Normal appearance of the included internal carotid artery. LEFT CAROTID SYSTEM: Common carotid artery is widely patent, coursing in a straight line fashion. Normal appearance of the carotid bifurcation without hemodynamically significant stenosis by NASCET criteria. Normal appearance of the included internal carotid artery.  VERTEBRAL ARTERIES:LEFT vertebral artery arises directly from the aortic arch, normal variant . RIGHT vertebral artery is dominant. Trace calcific atherosclerosis P1 segment. Normal appearance of the vertebral arteries, which appear widely patent. SKELETON: No acute osseous process though bone windows have not been submitted. Multiple predominately LEFT maxillary dental caries and periapical lucency/abscess. OTHER NECK: Soft tissues of the neck are non-acute though, not tailored for evaluation. CTA HEAD ANTERIOR CIRCULATION: Normal appearance of the cervical internal carotid arteries, petrous, cavernous and supra clinoid internal carotid arteries. Widely patent anterior communicating artery. Patent anterior cerebral arteries, mild luminal regularity distal RIGHT callosum marginal ACA. Moderate stenosis LEFT M2 origin, inferior segment. Moderate tandem stenoses LEFT MCA. No large vessel occlusion, hemodynamically significant stenosis, dissection, contrast extravasation or aneurysm. POSTERIOR CIRCULATION: LEFT vertebral artery predominantly terminate in the posterior inferior cerebellar artery. Moderate luminal irregularity of the basilar artery which remains patent. Patent vertebrobasilar junction and basilar artery, as well as main branch vessels. Moderate tandem stenoses RIGHT greater than LEFT posterior cerebral arteries. No large vessel occlusion, hemodynamically significant stenosis, dissection, contrast extravasation or aneurysm. VENOUS SINUSES: Major dural venous sinuses are patent though not tailored for evaluation on this angiographic examination. ANATOMIC VARIANTS: None. DELAYED PHASE: Not performed. IMPRESSION: CTA NECK: No hemodynamically significant stenosis or acute vascular process in the neck. CTA HEAD: No emergent large vessel occlusion or high-grade stenosis. Multifocal moderate stenoses LEFT MCA, bilateral PCA's compatible with atherosclerosis. Critical Value/emergent results were called by  telephone at the time of interpretation on 12/16/2016 at 4:00 am to Dr. Roland Rack , who verbally acknowledged these results. Electronically Signed   By: Elon Alas M.D.   On: 12/16/2016 04:19   Ct Angio Neck W Or Wo Contrast  Result Date: 12/16/2016 CLINICAL DATA:  LEFT upper extremity tingling. History of hypertension, hypercholesterolemia and atrial verbally. EXAM: CT ANGIOGRAPHY HEAD AND NECK TECHNIQUE: Multidetector CT imaging of the head and neck was performed using the standard protocol during bolus administration of intravenous contrast. Multiplanar CT image reconstructions and MIPs were obtained to evaluate the vascular anatomy. Carotid stenosis measurements (when applicable) are obtained utilizing NASCET criteria, using the distal internal carotid diameter as the denominator. CONTRAST:  100 cc Isovue 370 COMPARISON:  CT HEAD December 16, 2016 at 0339 hours FINDINGS: CTA NECK AORTIC ARCH: Normal appearance of the thoracic arch, normal branch pattern. The origins of the innominate, left Common carotid artery and subclavian artery are widely patent. RIGHT CAROTID SYSTEM: Common carotid artery is widely patent, coursing in a straight line fashion. Normal appearance of the carotid bifurcation without hemodynamically significant stenosis by NASCET criteria. Normal appearance of the included internal carotid artery. LEFT CAROTID SYSTEM: Common carotid artery is widely patent, coursing in a straight line fashion. Normal appearance of the carotid bifurcation without hemodynamically significant stenosis by NASCET criteria. Normal appearance of the included internal carotid artery. VERTEBRAL ARTERIES:LEFT vertebral artery arises directly from the aortic arch, normal variant . RIGHT  vertebral artery is dominant. Trace calcific atherosclerosis P1 segment. Normal appearance of the vertebral arteries, which appear widely patent. SKELETON: No acute osseous process though bone windows have not been  submitted. Multiple predominately LEFT maxillary dental caries and periapical lucency/abscess. OTHER NECK: Soft tissues of the neck are non-acute though, not tailored for evaluation. CTA HEAD ANTERIOR CIRCULATION: Normal appearance of the cervical internal carotid arteries, petrous, cavernous and supra clinoid internal carotid arteries. Widely patent anterior communicating artery. Patent anterior cerebral arteries, mild luminal regularity distal RIGHT callosum marginal ACA. Moderate stenosis LEFT M2 origin, inferior segment. Moderate tandem stenoses LEFT MCA. No large vessel occlusion, hemodynamically significant stenosis, dissection, contrast extravasation or aneurysm. POSTERIOR CIRCULATION: LEFT vertebral artery predominantly terminate in the posterior inferior cerebellar artery. Moderate luminal irregularity of the basilar artery which remains patent. Patent vertebrobasilar junction and basilar artery, as well as main branch vessels. Moderate tandem stenoses RIGHT greater than LEFT posterior cerebral arteries. No large vessel occlusion, hemodynamically significant stenosis, dissection, contrast extravasation or aneurysm. VENOUS SINUSES: Major dural venous sinuses are patent though not tailored for evaluation on this angiographic examination. ANATOMIC VARIANTS: None. DELAYED PHASE: Not performed. IMPRESSION: CTA NECK: No hemodynamically significant stenosis or acute vascular process in the neck. CTA HEAD: No emergent large vessel occlusion or high-grade stenosis. Multifocal moderate stenoses LEFT MCA, bilateral PCA's compatible with atherosclerosis. Critical Value/emergent results were called by telephone at the time of interpretation on 12/16/2016 at 4:00 am to Dr. Roland Rack , who verbally acknowledged these results. Electronically Signed   By: Elon Alas M.D.   On: 12/16/2016 04:19   Mr Brain Wo Contrast  Result Date: 12/16/2016 CLINICAL DATA:  History of atrial fibrillation. Acute  presentation with aphasia and right-sided weakness which began 4 hours ago. EXAM: MRI HEAD WITHOUT CONTRAST MRA HEAD WITHOUT CONTRAST TECHNIQUE: Multiplanar, multiecho pulse sequences of the brain and surrounding structures were obtained without intravenous contrast. Angiographic images of the head were obtained using MRA technique without contrast. COMPARISON:  CT study same day FINDINGS: MRI HEAD FINDINGS Brain: The brain has normal appearance without evidence of malformation, atrophy, old or acute small or large vessel infarction, hemorrhage, hydrocephalus or extra-axial collection. No pituitary abnormality. Vascular: Major vessels at the base of the brain show flow. Skull and upper cervical spine: Normal Sinuses/Orbits: Clear/ normal. Other: None significant. MRA HEAD FINDINGS Both internal carotid arteries are widely patent into the brain. No siphon stenosis. The anterior and middle cerebral vessels are patent without proximal stenosis, aneurysm or vascular malformation. Both vertebral arteries are widely patent to the basilar. Distal left vertebral artery is diminutive. This is favored to be congenital. Acquired stenotic disease felt less likely. No basilar stenosis. Posterior circulation branch vessels appear normal. IMPRESSION: Normal appearance of the brain itself. No evidence of old or acute insult. Normal intracranial MR angiography of the large and medium size vessels. The distal left vertebral artery is diminutive, which I favor as being congenital rather than acquired. Electronically Signed   By: Nelson Chimes M.D.   On: 12/16/2016 06:32   Mr Jodene Nam Head/brain [id number] Cm  Result Date: 12/16/2016 CLINICAL DATA:  History of atrial fibrillation. Acute presentation with aphasia and right-sided weakness which began 4 hours ago. EXAM: MRI HEAD WITHOUT CONTRAST MRA HEAD WITHOUT CONTRAST TECHNIQUE: Multiplanar, multiecho pulse sequences of the brain and surrounding structures were obtained without intravenous  contrast. Angiographic images of the head were obtained using MRA technique without contrast. COMPARISON:  CT study same day FINDINGS: MRI HEAD FINDINGS Brain:  The brain has normal appearance without evidence of malformation, atrophy, old or acute small or large vessel infarction, hemorrhage, hydrocephalus or extra-axial collection. No pituitary abnormality. Vascular: Major vessels at the base of the brain show flow. Skull and upper cervical spine: Normal Sinuses/Orbits: Clear/ normal. Other: None significant. MRA HEAD FINDINGS Both internal carotid arteries are widely patent into the brain. No siphon stenosis. The anterior and middle cerebral vessels are patent without proximal stenosis, aneurysm or vascular malformation. Both vertebral arteries are widely patent to the basilar. Distal left vertebral artery is diminutive. This is favored to be congenital. Acquired stenotic disease felt less likely. No basilar stenosis. Posterior circulation branch vessels appear normal. IMPRESSION: Normal appearance of the brain itself. No evidence of old or acute insult. Normal intracranial MR angiography of the large and medium size vessels. The distal left vertebral artery is diminutive, which I favor as being congenital rather than acquired. Electronically Signed   By: Nelson Chimes M.D.   On: 12/16/2016 06:32   Ct Head Code Stroke W/o Cm  Result Date: 12/16/2016 CLINICAL DATA:  Code stroke. LEFT upper extremity arm tingling. History of hypertension, hypercholesterolemia, atrial fibrillation. EXAM: CT HEAD WITHOUT CONTRAST TECHNIQUE: Contiguous axial images were obtained from the base of the skull through the vertex without intravenous contrast. COMPARISON:  CT HEAD May 27, 2013 FINDINGS: BRAIN: The ventricles and sulci are normal. No intraparenchymal hemorrhage, mass effect nor midline shift. No acute large vascular territory infarcts. No abnormal extra-axial fluid collections. Basal cisterns are patent. VASCULAR: Trace  calcific atherosclerosis of the carotid siphons. Dense arterial and venous structures compatible with hemoconcentration. SKULL/SOFT TISSUES: No skull fracture. No significant soft tissue swelling. ORBITS/SINUSES: The included ocular globes and orbital contents are normal.The mastoid aircells and included paranasal sinuses are well-aerated. OTHER: None. ASPECTS [facility] Stroke Program Early CT Score) - Ganglionic level infarction (caudate, lentiform nuclei, internal capsule, insula, M1-M3 cortex): 7 - Supraganglionic infarction (M4-M6 cortex): 3 Total score (0-10 with 10 being normal): 10 IMPRESSION: 1. Negative CT HEAD. 2. ASPECTS is 10. 3. Critical Value/emergent results were called by telephone at the time of interpretation on 12/16/2016 at 3:45 am to Dr. Leonel Ramsay, Neurology who verbally acknowledged these results. Electronically Signed   By: Elon Alas M.D.   On: 12/16/2016 03:48     Subjective: - no chest pain, shortness of breath, no abdominal pain, nausea or vomiting.   Discharge Exam: Vitals:   12/16/16 1145 12/16/16 1345  BP: 136/89 132/86  Pulse: 63 63  Resp: 18 16  Temp: 97.7 F (36.5 C) 97.9 F (36.6 C)   Vitals:   12/16/16 0745 12/16/16 0945 12/16/16 1145 12/16/16 1345  BP: (!) 147/104 (!) 141/92 136/89 132/86  Pulse: (!) 58 62 63 63  Resp: 20 18 18 16   Temp: 97.7 F (36.5 C) 97.9 F (36.6 C) 97.7 F (36.5 C) 97.9 F (36.6 C)  TempSrc: Oral Oral Oral Oral  SpO2: 98% 97% 95% 96%  Weight:      Height:        General: Pt is alert, awake, not in acute distress Cardiovascular: RRR, S1/S2 +, no rubs, no gallops Respiratory: CTA bilaterally, no wheezing, no rhonchi Abdominal: Soft, NT, ND, bowel sounds + Extremities: no edema, no cyanosis    The results of significant diagnostics from this hospitalization (including imaging, microbiology, ancillary and laboratory) are listed below for reference.     Microbiology: No results found for this or any previous  visit (from the past 240 hour(s)).  Labs: BNP (last 3 results) No results for input(s): BNP in the last 8760 hours. Basic Metabolic Panel: No results for input(s): NA, K, CL, CO2, GLUCOSE, BUN, CREATININE, CALCIUM, MG, PHOS in the last 168 hours. Liver Function Tests: No results for input(s): AST, ALT, ALKPHOS, BILITOT, PROT, ALBUMIN in the last 168 hours. No results for input(s): LIPASE, AMYLASE in the last 168 hours. No results for input(s): AMMONIA in the last 168 hours. CBC: No results for input(s): WBC, NEUTROABS, HGB, HCT, MCV, PLT in the last 168 hours. Cardiac Enzymes: No results for input(s): CKTOTAL, CKMB, CKMBINDEX, TROPONINI in the last 168 hours. BNP: Invalid input(s): POCBNP CBG: No results for input(s): GLUCAP in the last 168 hours. D-Dimer No results for input(s): DDIMER in the last 72 hours. Hgb A1c No results for input(s): HGBA1C in the last 72 hours. Lipid Profile No results for input(s): CHOL, HDL, LDLCALC, TRIG, CHOLHDL, LDLDIRECT in the last 72 hours. Thyroid function studies No results for input(s): TSH, T4TOTAL, T3FREE, THYROIDAB in the last 72 hours.  Invalid input(s): FREET3 Anemia work up No results for input(s): VITAMINB12, FOLATE, FERRITIN, TIBC, IRON, RETICCTPCT in the last 72 hours. Urinalysis    Component Value Date/Time   COLORURINE YELLOW 07/14/2016 [place] 07/14/2016 1835   LABSPEC 1.010 07/14/2016 1835   PHURINE 6.0 07/14/2016 1835   GLUCOSEU NEGATIVE 07/14/2016 1835   HGBUR NEGATIVE 07/14/2016 [place] NEGATIVE 07/14/2016 1835   KETONESUR NEGATIVE 07/14/2016 1835   PROTEINUR NEGATIVE 07/14/2016 1835   NITRITE NEGATIVE 07/14/2016 1835   LEUKOCYTESUR NEGATIVE 07/14/2016 1835   Sepsis Labs Invalid input(s): PROCALCITONIN,  WBC,  LACTICIDVEN Microbiology No results found for this or any previous visit (from the past 240 hour(s)).   Time coordinating discharge: 40 minutes  SIGNED:  Marzetta Board,  MD  [facility] 12/29/2016, 2:15 PM Pager [phone]  If 7PM-7AM, please contact night-coverage [email or web address] Password [email or web address]

## 2016-12-16 NOTE — Consult Note (Signed)
Neurology Consultation Reason for Consult: Aphasia Referring Physician: Oni  CC: Aphasia  History is obtained from: Wife  HPI: Danny Kelley is a 51 y.o. male with a history of atrial fibrillation and coronary artery disease who presents with aphasia and right-sided weakness. His wife states that they're watching TV when just before 2:30 AM she notes that he was slurring his speech. He is brought into the emergency room as a code stroke where he was found to have some right-sided weakness and aphasia. CT was done which did not reveal any hemorrhage and a CTA was performed which did not demonstrate any large vessel occlusion.   LKW: 2:20 AM tpa given?: no, anticoagulated    ROS: A 14 point ROS was performed and is negative except as noted in the HPI.   Past Medical History:  Diagnosis Date  . Arthritis    "a little in my right knee" (07/14/2016)  . CAD (coronary artery disease)   . Chronic congestive heart failure with left ventricular diastolic dysfunction ([place])   . Complication of anesthesia    Pt reports "they have a hard time waking me up"  . GERD (gastroesophageal reflux disease)    hx  . Headache    "with every heart issue that I have" (07/14/2016)  . High cholesterol   . History of hiatal hernia 1990s   "fixed it w/scope down my throat"  . Hypertension   . Myocardial infarction 05/2013   stents: left PDA, 1st OM at [facility]  . Obesity   . Obstructive sleep apnea    previous test "inconclusive" per patient,  has appointment with [facility] 10/06/16 for evaluation  . Persistent atrial fibrillation ([place])   . Pneumonia ~ 1992  . Seasonal allergies    "I take Allegra prn" (07/14/2016)     Family History  Problem Relation Age of Onset  . Heart disease Mother 83    CABG age 67  . Breast cancer Mother   . Tongue cancer Mother   . Diabetes Mother   . Heart attack Father 57    Father had around 7 heart attacks per pt  . Diabetes Father   . Lung  cancer Father   . Congestive Heart Failure Father   . Lung cancer Paternal Uncle      Social History:  reports that he has been smoking Cigarettes and E-cigarettes.  He has a 25.00 pack-year smoking history. He has never used smokeless tobacco. He reports that he does not drink alcohol or use drugs.   Exam: Current vital signs: BP 141/97   Pulse 65   Temp 98.1 F (36.7 C)   Resp 19   Ht 5\' 10"  (1.778 m)   Wt 108.3 kg (238 lb 12.1 oz)   SpO2 91%   BMI 34.26 kg/m  Vital signs in last 24 hours: Temp:  [98.1 F (36.7 C)] 98.1 F (36.7 C) (02/13 0421) Pulse Rate:  [65-72] 65 (02/13 0430) Resp:  [19-21] 19 (02/13 0430) BP: (122-144)/(76-102) 141/97 (02/13 0430) SpO2:  [91 %-96 %] 91 % (02/13 0430) Weight:  [108.3 kg (238 lb 12.1 oz)-111.2 kg (245 lb 3.2 oz)] 108.3 kg (238 lb 12.1 oz) (02/13 0403)   Physical Exam  Constitutional: Appears well-developed and well-nourished.  Psych: Affect appropriate to situation Eyes: No scleral injection HENT: No OP obstrucion Head: Normocephalic.  Cardiovascular: Normal rate and regular rhythm.  Respiratory: Effort normal and breath sounds normal to anterior ascultation GI: Soft.  No distension. There  is no tenderness.  Skin: WDI  Neuro: Mental Status: Patient is awake, alert, he has a moderate expressive aphasia Cranial Nerves: II: Visual Fields are full. Pupils are equal, round, and reactive to light.   III,IV, VI: No clear disconjugate gaze and he is able to look both left and right but he does have some diplopia, worse on left gaze V: Facial sensation is diminished on the right VII: Facial movement is weak on the right VIII: hearing is intact to voice X: Uvula elevates symmetrically XI: Shoulder shrug is symmetric. XII: tongue is midline without atrophy or fasciculations.  Motor: Tone is normal. Bulk is normal. 5/5 strength was present on the left, he has 4+/5 weakness of the right arm and 4-/5 of the right leg   Sensory: Sensation is diminished on the right Cerebellar: FNF intact bilaterally      I have reviewed labs in epic and the results pertinent to this consultation are: CMP-unremarkable  I have reviewed the images obtained: CT head-no acute findings, CTA head-atherosclerotic disease  Impression: 51 year old male with atrial fibrillation on apixaban presents with right-sided weakness and aphasia. I strongly suspect that he has had a stroke, possibly ACA territory on the right. He does not have any large vessel occlusion that would be an intervention candidate and TPA is not option due to anticoagulation  I would favor holding anticoagulation at least until MRI is performed to assess for signs of the infarct. With his atherosclerotic disease, it is strongly possible that this represents atherosclerosis as opposed to atrial fibrillation as an etiology.  Recommendations: 1. HgbA1c, fasting lipid panel 2. MRI, MRA  of the brain without contrast 3. Frequent neuro checks 4. Echocardiogram 5. Carotid dopplers 6. Prophylactic therapy-Antiplatelet med: Aspirin - dose 325mg  PO or 300mg  PR 7. Risk factor modification 8. Telemetry monitoring 9. PT consult, OT consult, Speech consult 10. please page stroke NP  Or  PA  Or MD  from 8am -4 pm starting 2/13 as this patient will be followed by the stroke team at this point.   You can look them up on [email or web address]     Roland Rack, MD [facility] [phone]  If 7pm- 7am, please page neurology on call as listed in [place].

## 2016-12-16 NOTE — ED Provider Notes (Signed)
[facility] DEPT Provider Note   CSN: [id number] Arrival date & time: 12/16/16  [id number]  By signing my name below, I, Dolores Hoose, attest that this documentation has been prepared under the direction and in the presence of Everlene Balls, MD . Electronically Signed: Dolores Hoose, Scribe. 12/16/2016. 3:53 AM.  History   Chief Complaint Chief Complaint  Patient presents with  . Code Stroke  . Aphasia  . Diplopia  . Headache   The history is provided by the patient. No language interpreter was used.    HPI Comments:  Danny Kelley is a 51 y.o. male with pmhx of HTN, MI, HLD and CAD, brought in by ambulance, who presents to the Emergency Department complaining of sudden-onset, gradually resolving right-sided weakness beginning about 1.5 hours ago at 2:20AM on 12/16/2016. Per EMS, pt was watching television when his wife noted he began having difficulty forming words and he reported blurry vision. Pt is compliant with his daily Eliquis.   Past Medical History:  Diagnosis Date  . Arthritis    "a little in my right knee" (07/14/2016)  . CAD (coronary artery disease)   . Chronic congestive heart failure with left ventricular diastolic dysfunction ([place])   . Complication of anesthesia    Pt reports "they have a hard time waking me up"  . GERD (gastroesophageal reflux disease)    hx  . Headache    "with every heart issue that I have" (07/14/2016)  . High cholesterol   . History of hiatal hernia 1990s   "fixed it w/scope down my throat"  . Hypertension   . Myocardial infarction 05/2013   stents: left PDA, 1st OM at [facility]  . Obesity   . Obstructive sleep apnea    previous test "inconclusive" per patient,  has appointment with [facility] 10/06/16 for evaluation  . Persistent atrial fibrillation ([place])   . Pneumonia ~ 1992  . Seasonal allergies    "I take Allegra prn" (07/14/2016)    Patient Active Problem List   Diagnosis Date Noted  . A-fib ([place]) 11/13/2016  .  Abnormal EKG 07/15/2016  . QT prolongation 07/15/2016  . Near syncope 07/14/2016  . Coronary artery disease due to lipid rich plaque 07/14/2016  . Essential hypertension 07/14/2016  . Chronic diastolic CHF (congestive heart failure) ([place]) 07/14/2016  . Elevated serum creatinine 07/14/2016  . Hypotension, unspecified 07/14/2016  . Paroxysmal atrial fibrillation ([place]) 06/09/2016    Past Surgical History:  Procedure Laterality Date  . APPENDECTOMY  1984  . CARDIOVERSION N/A 05/27/2016   Procedure: CARDIOVERSION;  Surgeon: [name], MD;  Location: [facility] ENDOSCOPY;  Service: Cardiovascular;  Laterality: N/A;  . CARDIOVERSION N/A 06/11/2016   Procedure: CARDIOVERSION;  Surgeon: [name], MD;  Location: [place];  Service: Cardiovascular;  Laterality: N/A;  . CORONARY ANGIOPLASTY WITH STENT PLACEMENT  05/30/2013   "2 st[facility] put in at [facility]" & /stent card  . ELECTROPHYSIOLOGIC STUDY N/A 11/13/2016   Procedure: Atrial Fibrillation Ablation;  Surgeon: [name], MD;  Location: [place] CV LAB;  Service: Cardiovascular;  Laterality: N/A;  . ESOPHAGOGASTRODUODENOSCOPY (EGD) WITH ESOPHAGEAL DILATION  1990s X 2  . KNEE ARTHROSCOPY Right 1986; ~ 1995 X 2  . LAPAROSCOPIC CHOLECYSTECTOMY  2015  . REPAIR OF ESOPHAGUS  1998   perforation of the distal esophagus from bad heartburn  . TEE WITHOUT CARDIOVERSION N/A 11/13/2016   Procedure: TRANSESOPHAGEAL ECHOCARDIOGRAM (TEE);  Surgeon: Thayer Headings, MD;  Location: [place];  Service: Cardiovascular;  Laterality: N/A;       Home Medications    Prior to Admission medications   Medication Sig Start Date End Date Taking? Authorizing Provider  acetaminophen (TYLENOL) 500 MG tablet Take 1,000 mg by mouth every 6 (six) hours as needed for headache (pain).    Historical Provider, MD  apixaban (ELIQUIS) 5 MG TABS tablet Take 5 mg by mouth 2 (two) times daily.    Historical Provider, MD  b complex vitamins tablet Take 1 tablet by mouth daily.     Historical Provider, MD  bisoprolol (ZEBETA) 10 MG tablet Take 2 tablets (20 mg total) by mouth daily. 09/09/16   [name], MD  cholecalciferol (VITAMIN D) 1000 units tablet Take 1,000 Units by mouth daily.    Historical Provider, MD  dronedarone (MULTAQ) 400 MG tablet Take 400 mg by mouth 2 (two) times daily with a meal.    Historical Provider, MD  ezetimibe (ZETIA) 10 MG tablet Take 10 mg by mouth daily.    Historical Provider, MD  fexofenadine (ALLEGRA) 180 MG tablet Take 180 mg by mouth daily as needed (seasonal allergies).     Historical Provider, MD  Flaxseed, Linseed, (FLAX SEED OIL PO) Take 2,400 mg by mouth daily.    Historical Provider, MD  Multiple Vitamin (MULTIVITAMIN WITH MINERALS) TABS tablet Take 1 tablet by mouth daily. Centrum    Historical Provider, MD  Omega-3 Fatty Acids (FISH OIL) 1200 MG CAPS Take 1,200 mg by mouth daily.    Historical Provider, MD  phenylephrine-shark liver oil-mineral oil-petrolatum (PREPARATION H) 0.25-3-14-71.9 % rectal ointment Place 1 application rectally 2 (two) times daily as needed for hemorrhoids.    Historical Provider, MD  shark liver oil-cocoa butter (PREPARATION H) 0.25-3-85.5 % suppository Place 1 suppository rectally 2 (two) times daily as needed for hemorrhoids.    Historical Provider, MD  valsartan-hydrochlorothiazide (DIOVAN HCT) 160-12.5 MG tablet Take 1 tablet by mouth daily. 06/11/16   [name], MD  zolpidem (AMBIEN) 5 MG tablet Take 5 mg by mouth at bedtime.    Historical Provider, MD    Family History Family History  Problem Relation Age of Onset  . Heart disease Mother 98    CABG age 47  . Breast cancer Mother   . Tongue cancer Mother   . Diabetes Mother   . Heart attack Father 58    Father had around 7 heart attacks per pt  . Diabetes Father   . Lung cancer Father   . Congestive Heart Failure Father   . Lung cancer Paternal Uncle     Social History Social History  Substance Use Topics  . Smoking status: Light  Tobacco Smoker    Packs/day: 1.00    Years: 25.00    Types: Cigarettes, E-cigarettes    Last attempt to quit: 01/07/2015  . Smokeless tobacco: Never Used     Comment: As of 09/29/16-smoking 6-7 cigarettes a day  . Alcohol use No     Allergies   Crestor [rosuvastatin]; Percocet [oxycodone-acetaminophen]; and Erythromycin   Review of Systems Review of Systems   Physical Exam Updated Vital Signs Ht 5\' 10"  (1.778 m)   Wt 245 lb 2.4 oz (111.2 kg)   SpO2 92%   BMI 35.18 kg/m   Physical Exam  Constitutional: He is oriented to person, place, and time. Vital signs are normal. He appears well-developed and well-nourished.  Non-toxic appearance. He does not appear ill. No distress.  Slow to respond to questioning.  HENT:  Head: Normocephalic  and atraumatic.  Nose: Nose normal.  Mouth/Throat: Oropharynx is clear and moist. No oropharyngeal exudate.  Eyes: Conjunctivae and EOM are normal. Pupils are equal, round, and reactive to light. No scleral icterus.  Neck: Normal range of motion. Neck supple. No tracheal deviation, no edema, no erythema and normal range of motion present. No thyroid mass and no thyromegaly present.  Cardiovascular: Normal rate, regular rhythm, S1 normal, S2 normal, normal heart sounds, intact distal pulses and normal pulses.  Exam reveals no gallop and no friction rub.   No murmur heard. Pulmonary/Chest: Effort normal and breath sounds normal. No respiratory distress. He has no wheezes. He has no rhonchi. He has no rales.  Abdominal: Soft. Normal appearance and bowel sounds are normal. He exhibits no distension, no ascites and no mass. There is no hepatosplenomegaly. There is no tenderness. There is no rebound, no guarding and no CVA tenderness.  Musculoskeletal: Normal range of motion. He exhibits edema. He exhibits no tenderness.  2+ Edema in bilateral lower extremities.   Lymphadenopathy:    He has no cervical adenopathy.  Neurological: He is alert and oriented  to person, place, and time. He has normal strength. No cranial nerve deficit or sensory deficit.  4/5 Strength RU and RL extremities. 5/5 strength in LU and LL extremities.  Normal cerebellar testing.  Double vision in peripheral visual fields.   Skin: Skin is warm, dry and intact. No petechiae and no rash noted. He is not diaphoretic. No erythema. No pallor.  Nursing note and vitals reviewed.    ED Treatments / Results  DIAGNOSTIC STUDIES:  Oxygen Saturation is 92% on RA, low by my interpretation.    COORDINATION OF CARE:  3:59 AM Discussed treatment plan with pt at bedside which includes blood work and pt agreed to plan.  Labs (all labs ordered are listed, but only abnormal results are displayed) Labs Reviewed  I-STAT CHEM 8, ED - Abnormal; Notable for the following:       Result Value   Potassium 3.3 (*)    Glucose, Bld 119 (*)    Calcium, Ion 1.10 (*)    All other components within normal limits  CBC  DIFFERENTIAL  PROTIME-INR  APTT  COMPREHENSIVE METABOLIC PANEL  I-STAT TROPOININ, ED  CBG MONITORING, ED    EKG  EKG Interpretation None       Radiology Ct Head Code Stroke W/o Cm  Result Date: 12/16/2016 CLINICAL DATA:  Code stroke. LEFT upper extremity arm tingling. History of hypertension, hypercholesterolemia, atrial fibrillation. EXAM: CT HEAD WITHOUT CONTRAST TECHNIQUE: Contiguous axial images were obtained from the base of the skull through the vertex without intravenous contrast. COMPARISON:  CT HEAD May 27, 2013 FINDINGS: BRAIN: The ventricles and sulci are normal. No intraparenchymal hemorrhage, mass effect nor midline shift. No acute large vascular territory infarcts. No abnormal extra-axial fluid collections. Basal cisterns are patent. VASCULAR: Trace calcific atherosclerosis of the carotid siphons. Dense arterial and venous structures compatible with hemoconcentration. SKULL/SOFT TISSUES: No skull fracture. No significant soft tissue swelling.  ORBITS/SINUSES: The included ocular globes and orbital contents are normal.The mastoid aircells and included paranasal sinuses are well-aerated. OTHER: None. ASPECTS [facility] Stroke Program Early CT Score) - Ganglionic level infarction (caudate, lentiform nuclei, internal capsule, insula, M1-M3 cortex): 7 - Supraganglionic infarction (M4-M6 cortex): 3 Total score (0-10 with 10 being normal): 10 IMPRESSION: 1. Negative CT HEAD. 2. ASPECTS is 10. 3. Critical Value/emergent results were called by telephone at the time of interpretation on 12/16/2016 at 3:45  am to Dr. Leonel Ramsay, Neurology who verbally acknowledged these results. Electronically Signed   By: Elon Alas M.D.   On: 12/16/2016 03:48    Procedures Procedures (including critical care time)  Medications Ordered in ED Medications  iopamidol (ISOVUE-370) 76 % injection (100 mLs Intravenous Contrast Given 12/16/16 0345)     Initial Impression / Assessment and Plan / ED Course  I have reviewed the triage vital signs and the nursing notes.  Pertinent labs & imaging results that were available during my care of the patient were reviewed by me and considered in my medical decision making (see chart for details).      Patient presents to the ED for stroke like symptoms.  His exam is concerning and he has significant comorbidities that can cause a stroke.  Dr. Leonel Ramsay is recommending admission for further work up.  CT neg.  Patient on eloquis, so no tpa considered.  Will continue to monitor and admit to hospitalist for further care.      Final Clinical Impressions(s) / ED Diagnoses   Final diagnoses:  Stroke (cerebrum) ([place])    New Prescriptions New Prescriptions   No medications on file     I personally performed the services described in this documentation, which was scribed in my presence. The recorded information has been reviewed and is accurate.       Everlene Balls, MD 12/16/16 [phone]

## 2016-12-16 NOTE — Progress Notes (Signed)
Patient is discharged from room 5M09 at this time. Alert and in stable condition. IV site d/c'd as well as tele. Instructions read to patient and family with understanding verbalized. Left unit via wheelchair with all belongings at side.

## 2016-12-16 NOTE — Evaluation (Signed)
Physical Therapy Evaluation Patient Details Name: Danny Kelley MRN: [id number] DOB: December 25, 1965 Today's Date: 12/16/2016   History of Present Illness  51 y.o. male admitted to [facility] on 12/16/16 for right sided weakness and slurred speech.  CT and MRI are negative for acute events.  Further cardiac and stroke workup pending.  TIA suspected.  Pt with significant PMHx of A-fib, obesity, MI, HTN, HA, CHF, CAD, R knee arthroscopy, coronary angioplasty with stent, and cardioversion.  Clinical Impression  Pt was able to walk the hallway with the support of the IV pole, ascend and descend stairs simulating home entry and has inconsistencies in his tested muscle strength and his ability to walk without his right knee buckling (see details below).  He will likely not need any f/u therapy or an assistive device at discharge, but PT will follow acutely for balance and gait training.     Follow Up Recommendations No PT follow up;Supervision - Intermittent    Equipment Recommendations  None recommended by PT    Recommendations for Other Services   NA    Precautions / Restrictions Precautions Precautions: Fall Precaution Comments: pt is mildly unsteady on her feet      Mobility  Bed Mobility Overal bed mobility: Modified Independent             General bed mobility comments: slow, but able to sit up from flat bed without rail.   Transfers Overall transfer level: Needs assistance Equipment used: None Transfers: Sit to/from Stand Sit to Stand: Supervision         General transfer comment: supervision for safety due to slow speed, but no LOB or buckling.   Ambulation/Gait Ambulation/Gait assistance: Min guard Ambulation Distance (Feet): 200 Feet Assistive device: None;1 person hand held assist (IV pole) Gait Pattern/deviations: Step-through pattern;Decreased dorsiflexion - right;Decreased step length - right Gait velocity: decreased Gait velocity interpretation: Below normal speed  for age/gender General Gait Details: Pt with slow gait pattern and inconsistant right foot drag (at times he drags at times he DF).  Pt holding IV pole for most of gait for support.   Stairs Stairs: Yes Stairs assistance: Supervision Stair Management: One rail Right;Step to pattern;Forwards Number of Stairs: 2 General stair comments: Verbal cues to lead up with his strong and down with his weak leg.  Pt reliant on rail, however, no buckling noted in his right leg.          Balance Overall balance assessment: Needs assistance Sitting-balance support: Feet supported;No upper extremity supported Sitting balance-Leahy Scale: Good     Standing balance support: Bilateral upper extremity supported;No upper extremity supported;Single extremity supported Standing balance-Leahy Scale: Fair                               Pertinent Vitals/Pain Pain Assessment: 0-10 Pain Score: 5  Pain Location: headache Pain Descriptors / Indicators: Aching Pain Intervention(s): Limited activity within patient's tolerance;Repositioned;Premedicated before session;Monitored during session    [place] expects to be discharged to:: Private residence Living Arrangements: Spouse/significant other Available Help at Discharge: Family;Available 24 hours/day Type of Home: House Home Access: Stairs to enter Entrance Stairs-Rails: Right Entrance Stairs-Number of Steps: 2-3 (at all entrances) Home Layout: One level Home Equipment: None      Prior Function Level of Independence: Independent         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   Functionally, pt demonstrated different strength with inconsistant foot drag, slow speed, but good coordination, no buckling in right leg even on stairs which I would expect with weak tested strength.  Functional strength does not match his MMT supine in the bed.   RLE Sensation:  (intact to LT)    Cervical / Trunk Assessment Cervical / Trunk Assessment: Normal  Communication   Communication: No difficulties  Cognition Arousal/Alertness: Awake/alert Behavior During Therapy: WFL for tasks assessed/performed Overall Cognitive Status: Within Functional Limits for tasks assessed                      General Comments General comments (skin integrity, edema, etc.): Educated pt on going slowly, being careful, not driving (as it is his driving leg that is affected) and making sure to do the stairs holding a railing.         Assessment/Plan    PT Assessment Patient needs continued PT services  PT Problem List Decreased strength;Decreased activity tolerance;Decreased balance;Decreased mobility;Decreased coordination;Decreased knowledge of use of DME;Pain          PT Treatment Interventions DME instruction;Gait training;Stair training;Functional mobility training;Therapeutic activities;Therapeutic exercise;Balance training;Neuromuscular re-education;Patient/family education    PT Goals (Current goals can be found in the Care Plan section)  Acute Rehab PT Goals Patient Stated Goal: to return to normal PT Goal Formulation: With patient Time For Goal Achievement: 12/30/16 Potential to Achieve Goals: Good    Frequency Min 4X/week           End of Session   Activity Tolerance: Patient limited by fatigue Patient left: in bed;with call bell/phone within reach;with family/visitor present      Functional Assessment Tool Used: assist level Functional Limitation: Mobility: Walking and moving around Mobility: Walking and Moving Around Current Status [id number]): At least 1 percent but  less than 20 percent impaired, limited or restricted Mobility: Walking and Moving Around Goal Status [phone]): 0 percent impaired, limited or restricted    Time: [id number] PT Time Calculation (min) (ACUTE ONLY): 28 min   Charges:   PT Evaluation $PT Eval Moderate Complexity: 1 Procedure PT Treatments $Gait Training: 8-22 mins   PT G Codes:   PT G-Codes **NOT FOR INPATIENT CLASS** Functional Assessment Tool Used: assist level Functional Limitation: Mobility: Walking and moving around Mobility: Walking and Moving Around Current Status [id number]): At least 1 percent but less than 20 percent impaired, limited or restricted Mobility: Walking and Moving Around Goal Status [phone]): 0 percent impaired, limited or restricted    Danny Klem B. [place], [place], DPT [phone]   12/16/2016, 12:04 PM

## 2016-12-17 LAB — HEMOGLOBIN A1C
Hgb A1c MFr Bld: 6 % — ABNORMAL HIGH (ref 4.8–5.6)
MEAN PLASMA GLUCOSE: 126 mg/dL

## 2017-01-28 ENCOUNTER — Encounter: Payer: Self-pay | Admitting: Internal Medicine

## 2017-01-28 ENCOUNTER — Encounter (INDEPENDENT_AMBULATORY_CARE_PROVIDER_SITE_OTHER): Payer: Self-pay

## 2017-01-28 ENCOUNTER — Ambulatory Visit (INDEPENDENT_AMBULATORY_CARE_PROVIDER_SITE_OTHER): Payer: BLUE CROSS/BLUE SHIELD | Admitting: Internal Medicine

## 2017-01-28 VITALS — BP 112/76 | HR 93 | Ht 70.0 in | Wt 245.4 lb

## 2017-01-28 DIAGNOSIS — I480 Paroxysmal atrial fibrillation: Secondary | ICD-10-CM

## 2017-01-28 DIAGNOSIS — I10 Essential (primary) hypertension: Secondary | ICD-10-CM

## 2017-01-28 MED ORDER — AMLODIPINE BESYLATE 5 MG PO TABS
5.0000 mg | ORAL_TABLET | Freq: Every day | ORAL | 3 refills | Status: DC
Start: 2017-01-28 — End: 2018-09-16

## 2017-01-28 NOTE — Progress Notes (Signed)
Electrophysiology Office Note   Date:  01/30/2017   ID:  [name], DOB [birth year], MRN [id number]  PCP:  none Cardiologist:  Dr Einar Gip Primary Electrophysiologist: [name], MD    Chief Complaint  Patient presents with  . Atrial Fibrillation     History of Present Illness: Danny Kelley is a 51 y.o. male who presents today for electrophysiology evaluation.   He is s/p ablation.  He recently had a TIA.  ASA has been added to his eliquis.  He has not had any symptomatic episodes of atrial fibrillation.  Denies symptoms related to his ablation.  Today, he denies symptoms of chest pain, shortness of breath, orthopnea, PND, lower extremity edema, claudication, dizziness, syncope, bleeding, or subsequent neurologic sequela. The patient is tolerating medications without difficulties and is otherwise without complaint today.    Past Medical History:  Diagnosis Date  . Arthritis    "a little in my right knee" (07/14/2016)  . CAD (coronary artery disease)   . Chronic congestive heart failure with left ventricular diastolic dysfunction (Danny Kelley)   . Complication of anesthesia    Pt reports "they have a hard time waking me up"  . GERD (gastroesophageal reflux disease)    hx  . Headache    "with every heart issue that I have" (07/14/2016)  . High cholesterol   . History of hiatal hernia 1990s   "fixed it w/scope down my throat"  . Hypertension   . Myocardial infarction 05/2013   stents: left PDA, 1st OM at [facility]  . Obesity   . Obstructive sleep apnea    previous test "inconclusive" per patient,  has appointment with [facility] 10/06/16 for evaluation  . Persistent atrial fibrillation ([place])   . Pneumonia ~ 1992  . Seasonal allergies    "I take Allegra prn" (07/14/2016)   Past Surgical History:  Procedure Laterality Date  . APPENDECTOMY  1984  . CARDIOVERSION N/A 05/27/2016   Procedure: CARDIOVERSION;  Surgeon: [name], MD;  Location: [facility] ENDOSCOPY;   Service: Cardiovascular;  Laterality: N/A;  . CARDIOVERSION N/A 06/11/2016   Procedure: CARDIOVERSION;  Surgeon: [name], MD;  Location: [facility];  Service: Cardiovascular;  Laterality: N/A;  . CORONARY ANGIOPLASTY WITH STENT PLACEMENT  05/30/2013   "2 stents put in at [facility]" & /stent card  . ELECTROPHYSIOLOGIC STUDY N/A 11/13/2016   Procedure: Atrial Fibrillation Ablation;  Surgeon: [name], MD;  Location: [facility];  Service: Cardiovascular;  Laterality: N/A;  . ESOPHAGOGASTRODUODENOSCOPY (EGD) WITH ESOPHAGEAL DILATION  1990s X 2  . KNEE ARTHROSCOPY Right 1986; ~ 1995 X 2  . LAPAROSCOPIC CHOLECYSTECTOMY  2015  . REPAIR OF ESOPHAGUS  1998   perforation of the distal esophagus from bad heartburn  . TEE WITHOUT CARDIOVERSION N/A 11/13/2016   Procedure: TRANSESOPHAGEAL ECHOCARDIOGRAM (TEE);  Surgeon: Thayer Headings, MD;  Location: [facility] ENDOSCOPY;  Service: Cardiovascular;  Laterality: N/A;     Current Outpatient Prescriptions  Medication Sig Dispense Refill  . acetaminophen (TYLENOL) 500 MG tablet Take 1,000 mg by mouth every 6 (six) hours as needed for headache (pain).    Marland Kitchen amLODipine (NORVASC) 5 MG tablet Take 1 tablet (5 mg total) by mouth daily. 90 tablet 3  . apixaban (ELIQUIS) 5 MG TABS tablet Take 5 mg by mouth 2 (two) times daily.    Marland Kitchen aspirin EC 81 MG EC tablet Take 1 tablet (81 mg total) by mouth daily. 30 tablet 1  . b complex  vitamins tablet Take 1 tablet by mouth daily.    . bisoprolol (ZEBETA) 10 MG tablet Take 2 tablets (20 mg total) by mouth daily. 60 tablet 1  . ezetimibe (ZETIA) 10 MG tablet Take 10 mg by mouth daily.    . fexofenadine (ALLEGRA) 180 MG tablet Take 180 mg by mouth daily as needed (seasonal allergies).     . Flaxseed, Linseed, (FLAX SEED OIL PO) Take 1,200 mg by mouth daily.     . Multiple Vitamin (MULTIVITAMIN WITH MINERALS) TABS tablet Take 1 tablet by mouth daily. Centrum    . nicotine (NICODERM CQ - DOSED IN MG/24 HOURS) 21 mg/24hr  patch Place 1 patch (21 mg total) onto the skin daily. 28 patch 0  . Omega-3 Fatty Acids (FISH OIL OMEGA-3 PO) Take 540 mg by mouth daily.    . phenylephrine-shark liver oil-mineral oil-petrolatum (PREPARATION H) 0.25-3-14-71.9 % rectal ointment Place 1 application rectally 2 (two) times daily as needed for hemorrhoids.    . promethazine (PHENERGAN) 25 MG tablet Take 25 mg by mouth every 6 (six) hours as needed for nausea or vomiting (for 5 days).    . shark liver oil-cocoa butter (PREPARATION H) 0.25-3-85.5 % suppository Place 1 suppository rectally 2 (two) times daily as needed for hemorrhoids.    . torsemide (DEMADEX) 20 MG tablet Take 20 mg by mouth as directed. Take as needed for swelling    . valsartan-hydrochlorothiazide (DIOVAN-HCT) 320-25 MG tablet Take 1 tablet by mouth daily.    Marland Kitchen zolpidem (AMBIEN) 5 MG tablet Take 5 mg by mouth at bedtime.    . cholecalciferol (VITAMIN D) 1000 units tablet Take 1,000 Units by mouth daily.     No current facility-administered medications for this visit.     Allergies:   Crestor [rosuvastatin]; Percocet [oxycodone-acetaminophen]; and Erythromycin   Social History:  The patient  reports that he quit smoking about 6 weeks ago. His smoking use included Cigarettes and E-cigarettes. He has a 25.00 pack-year smoking history. He has never used smokeless tobacco. He reports that he does not drink alcohol or use drugs.   Family History:  The patient's family history includes Breast cancer in his mother; Congestive Heart Failure in his father; Diabetes in his father and mother; Heart attack (age of onset: 32) in his father; Heart disease (age of onset: 33) in his mother; Lung cancer in his father and paternal uncle; Tongue cancer in his mother.    ROS:  Please see the history of present illness.   All other systems are reviewed and negative.    PHYSICAL EXAM: VS:  BP 112/76   Pulse 93   Ht 5\' 10"  (1.778 m)   Wt 245 lb 6.4 oz (111.3 kg)   SpO2 96%   BMI  35.21 kg/m  , BMI Body mass index is 35.21 kg/m. GEN: overweight, in no acute distress, dissheveled HEENT: normal  Neck: no JVD, carotid bruits, or masses Cardiac: RRR; no murmurs, rubs, or gallops,no edema  Respiratory:  clear to auscultation bilaterally, normal work of breathing GI: soft, nontender, nondistended, + BS MS: no deformity or atrophy  Skin: warm and dry  Neuro:  Strength and sensation are intact Psych: euthymic mood, full affect  EKG:  EKG is ordered today. The ekg ordered today shows sinus rhythm 93 bpm,  nonspecific St/T changes  Recent Labs: 09/09/2016: Magnesium 2.4 12/16/2016: ALT 26; BUN 15; Creatinine, Ser 0.80; Hemoglobin 16.0; Platelets 243; Potassium 3.3; Sodium 139    Lipid Panel  Component Value Date/Time   CHOL 230 (H) 12/16/2016 0500   TRIG 175 (H) 12/16/2016 0500   HDL 54 12/16/2016 0500   CHOLHDL 4.3 12/16/2016 0500   VLDL 35 12/16/2016 0500   LDLCALC 141 (H) 12/16/2016 0500     Wt Readings from Last 3 Encounters:  01/28/17 245 lb 6.4 oz (111.3 kg)  12/16/16 238 lb 12.1 oz (108.3 kg)  12/15/16 245 lb 3.2 oz (111.2 kg)      Other studies Reviewed: Additional studies/ records that were reviewed today include: hospital records   ASSESSMENT AND PLAN:  1.  Persistent atrial fibrillation Doing well s/p ablation, without recurrence Stop multaq Continue anticoagulation, given recent TIA symptoms, may require long term anticoagulation with ASA and eliquis.  Will defer to neurology.    2. ? OSA To be followed by neurology  3. Overweight lifstyle modification encouraged  4. HTN He reports frequently elevated BP recently with occasional headaches Increase norvasc to 5mg  daily 2 gram sodium diet Lifestyle change encouraged  Return to see me in 3 months Follow-up with neurology is encouraged Follow-up with Dr Einar Gip as scheduled  Signed, [name], MD    [address] [place]  [phone] (office) [phone] (fax)

## 2017-01-28 NOTE — Patient Instructions (Signed)
Medication Instructions:  Your physician has recommended you make the following change in your medication:  1) Stop Multaq 2) Increase Norvasc to 5 mg daily   Labwork: None ordered   Testing/Procedures: None ordered   Follow-Up: Your physician recommends that you schedule a follow-up appointment in: 3 months with Dr Rayann Heman   Any Other Special Instructions Will Be Listed Below (If Applicable).  Low-Sodium Eating Plan Sodium, which is an element that makes up salt, helps you maintain a healthy balance of fluids in your body. Too much sodium can increase your blood pressure and cause fluid and waste to be held in your body. Your health care provider or dietitian may recommend following this plan if you have high blood pressure (hypertension), kidney disease, liver disease, or heart failure. Eating less sodium can help lower your blood pressure, reduce swelling, and protect your heart, liver, and kidneys. What are tips for following this plan? General guidelines   Most people on this plan should limit their sodium intake to 1,500-2,000 mg (milligrams) of sodium each day. Reading food labels   The Nutrition Facts label lists the amount of sodium in one serving of the food. If you eat more than one serving, you must multiply the listed amount of sodium by the number of servings.  Choose foods with less than 140 mg of sodium per serving.  Avoid foods with 300 mg of sodium or more per serving. Shopping   Look for lower-sodium products, often labeled as "low-sodium" or "no salt added."  Always check the sodium content even if foods are labeled as "unsalted" or "no salt added".  Buy fresh foods.  Avoid canned foods and premade or frozen meals.  Avoid canned, cured, or processed meats  Buy breads that have less than 80 mg of sodium per slice. Cooking   Eat more home-cooked food and less restaurant, buffet, and fast food.  Avoid adding salt when cooking. Use salt-free  seasonings or herbs instead of table salt or sea salt. Check with your health care provider or pharmacist before using salt substitutes.  Cook with plant-based oils, such as canola, sunflower, or olive oil. Meal planning   When eating at a restaurant, ask that your food be prepared with less salt or no salt, if possible.  Avoid foods that contain MSG (monosodium glutamate). MSG is sometimes added to [place] food, bouillon, and some canned foods. What foods are recommended? The items listed may not be a complete list. Talk with your dietitian about what dietary choices are best for you. Grains  Low-sodium cereals, including oats, puffed wheat and rice, and shredded wheat. Low-sodium crackers. Unsalted rice. Unsalted pasta. Low-sodium bread. Whole-grain breads and whole-grain pasta. Vegetables  Fresh or frozen vegetables. "No salt added" canned vegetables. "No salt added" tomato sauce and paste. Low-sodium or reduced-sodium tomato and vegetable juice. Fruits  Fresh, frozen, or canned fruit. Fruit juice. Meats and other protein foods  Fresh or frozen (no salt added) meat, poultry, seafood, and fish. Low-sodium canned tuna and salmon. Unsalted nuts. Dried peas, beans, and lentils without added salt. Unsalted canned beans. Eggs. Unsalted nut butters. Dairy  Milk. Soy milk. Cheese that is naturally low in sodium, such as ricotta cheese, fresh mozzarella, or Swiss cheese Low-sodium or reduced-sodium cheese. Cream cheese. Yogurt. Fats and oils  Unsalted butter. Unsalted margarine with no trans fat. Vegetable oils such as canola or olive oils. Seasonings and other foods  Fresh and dried herbs and spices. Salt-free seasonings. Low-sodium mustard and ketchup. Sodium-free  salad dressing. Sodium-free light mayonnaise. Fresh or refrigerated horseradish. Lemon juice. Vinegar. Homemade, reduced-sodium, or low-sodium soups. Unsalted popcorn and pretzels. Low-salt or salt-free chips. What foods are not  recommended? The items listed may not be a complete list. Talk with your dietitian about what dietary choices are best for you. Grains  Instant hot cereals. Bread stuffing, pancake, and biscuit mixes. Croutons. Seasoned rice or pasta mixes. Noodle soup cups. Boxed or frozen macaroni and cheese. Regular salted crackers. Self-rising flour. Vegetables  Sauerkraut, pickled vegetables, and relishes. Olives. [place] fries. Onion rings. Regular canned vegetables (not low-sodium or reduced-sodium). Regular canned tomato sauce and paste (not low-sodium or reduced-sodium). Regular tomato and vegetable juice (not low-sodium or reduced-sodium). Frozen vegetables in sauces. Meats and other protein foods  Meat or fish that is salted, canned, smoked, spiced, or pickled. Bacon, ham, sausage, hotdogs, corned beef, chipped beef, packaged lunch meats, salt pork, jerky, pickled herring, anchovies, regular canned tuna, sardines, salted nuts. Dairy  Processed cheese and cheese spreads. Cheese curds. Blue cheese. Feta cheese. String cheese. Regular cottage cheese. Buttermilk. Canned milk. Fats and oils  Salted butter. Regular margarine. Ghee. Bacon fat. Seasonings and other foods  Onion salt, garlic salt, seasoned salt, table salt, and sea salt. Canned and packaged gravies. Worcestershire sauce. Tartar sauce. Barbecue sauce. Teriyaki sauce. Soy sauce, including reduced-sodium. Steak sauce. Fish sauce. Oyster sauce. Cocktail sauce. Horseradish that you find on the shelf. Regular ketchup and mustard. Meat flavorings and tenderizers. Bouillon cubes. Hot sauce and Tabasco sauce. Premade or packaged marinades. Premade or packaged taco seasonings. Relishes. Regular salad dressings. Salsa. Potato and tortilla chips. Corn chips and puffs. Salted popcorn and pretzels. Canned or dried soups. Pizza. Frozen entrees and pot pies. Summary  Eating less sodium can help lower your blood pressure, reduce swelling, and protect your heart,  liver, and kidneys.  Most people on this plan should limit their sodium intake to 1,500-2,000 mg (milligrams) of sodium each day.  Canned, boxed, and frozen foods are high in sodium. Restaurant foods, fast foods, and pizza are also very high in sodium. You also get sodium by adding salt to food.  Try to cook at home, eat more fresh fruits and vegetables, and eat less fast food, canned, processed, or prepared foods. This information is not intended to replace advice given to you by your health care provider. Make sure you discuss any questions you have with your health care provider. Document Released: 04/11/2002 Document Revised: 10/13/2016 Document Reviewed: 10/13/2016 Elsevier Interactive Patient Education  2017 [employer].      If you need a refill on your cardiac medications before your next appointment, please call your pharmacy.

## 2017-02-18 ENCOUNTER — Encounter: Payer: Self-pay | Admitting: Nurse Practitioner

## 2017-02-18 ENCOUNTER — Ambulatory Visit (INDEPENDENT_AMBULATORY_CARE_PROVIDER_SITE_OTHER): Payer: BLUE CROSS/BLUE SHIELD | Admitting: Nurse Practitioner

## 2017-02-18 VITALS — BP 122/82 | HR 56 | Ht 69.0 in | Wt 251.2 lb

## 2017-02-18 DIAGNOSIS — G459 Transient cerebral ischemic attack, unspecified: Secondary | ICD-10-CM | POA: Diagnosis not present

## 2017-02-18 DIAGNOSIS — E782 Mixed hyperlipidemia: Secondary | ICD-10-CM

## 2017-02-18 DIAGNOSIS — I4891 Unspecified atrial fibrillation: Secondary | ICD-10-CM

## 2017-02-18 DIAGNOSIS — I10 Essential (primary) hypertension: Secondary | ICD-10-CM

## 2017-02-18 NOTE — Progress Notes (Signed)
[facility]  [name] DOB: 05/23/1966   REASON FOR VISIT: Hospital follow-up for left brain TIA with risk factors of atrial fibrillation hypertension hyperlipidemia smoking and obstructive sleep apnea HISTORY FROM: Patient and wife    HISTORY OF PRESENT ILLNESS: Danny Kelley, 51 year old male returns for hospital follow-up. He has a history of atrial fibrillation and coronary artery disease. He presented to the hospital with aphasia and right-sided weakness. He had some slurring of speech he was not administered IV TPA secondary to being anticoagulated. He had recent ablation by Dr. Rayann Heman, follows with Dr. Einar Gip for atrial fibrillation. MRI of brain normal MRA no significant stenosis. CTA of the head no LVO. Moderate stenosis left MCA . CTA of the neck no LVO. 2-D echo EF 55-60% no source of embolus. LDL 141 hemoglobin A1c 6. He returns for follow-up today without further stroke or TIA symptoms. He remains on aspirin 0.81 and eliquis for secondary stroke prevention and atrial fibrillation. He has stopped smoking. Blood pressure in the office today 122/82. He is on Zetia and fish oil  for hyperlipidemia. He has failed statins in the past. Consider PCSK9 inhibitor in the future. He has had a sleep study but has not obtained the results. He complains with headaches since his stroke and has been placed on Trokendi by PCP. He is on 50 mg and was made aware this can be titrated up over time. Also made aware of that untreated obstructive sleep apnea increases headaches. He returns for reevaluation   REVIEW OF SYSTEMS: Full 14 system review of systems performed and notable only for those listed, all others are neg:  Constitutional: Fatigue  Cardiovascular: Palpitations Ear/Nose/Throat: neg  Skin: neg Eyes: Blurred vision and light sensitivity Respiratory: Shortness of breath, chest tightness Gastroitestinal: neg  Hematology/Lymphatic: neg  Endocrine:  neg Musculoskeletal: Walking difficulty, neck stiffness Allergy/Immunology: neg Neurological: Headache Psychiatric: Decreased concentration and depression Sleep : Snoring, daytime sleepiness ALLERGIES: Allergies  Allergen Reactions  . Crestor [Rosuvastatin] Other (See Comments)    Severe leg cramps  . Percocet [Oxycodone-Acetaminophen] Itching, Swelling, Rash and Other (See Comments)    Swelling to face, hands, mouth  . Erythromycin Other (See Comments)    Hallucinations     HOME MEDICATIONS: Outpatient Medications Prior to Visit  Medication Sig Dispense Refill  . acetaminophen (TYLENOL) 500 MG tablet Take 1,000 mg by mouth every 6 (six) hours as needed for headache (pain).    Marland Kitchen amLODipine (NORVASC) 5 MG tablet Take 1 tablet (5 mg total) by mouth daily. 90 tablet 3  . apixaban (ELIQUIS) 5 MG TABS tablet Take 5 mg by mouth 2 (two) times daily.    Marland Kitchen aspirin EC 81 MG EC tablet Take 1 tablet (81 mg total) by mouth daily. 30 tablet 1  . b complex vitamins tablet Take 1 tablet by mouth daily.    . bisoprolol (ZEBETA) 10 MG tablet Take 2 tablets (20 mg total) by mouth daily. 60 tablet 1  . cholecalciferol (VITAMIN D) 1000 units tablet Take 1,000 Units by mouth daily.    Marland Kitchen ezetimibe (ZETIA) 10 MG tablet Take 10 mg by mouth daily.    . fexofenadine (ALLEGRA) 180 MG tablet Take 180 mg by mouth daily as needed (seasonal allergies).     . Flaxseed, Linseed, (FLAX SEED OIL PO) Take 1,200 mg by mouth daily.     . Multiple Vitamin (MULTIVITAMIN WITH MINERALS) TABS tablet Take 1 tablet by mouth daily. Centrum    . nicotine (  NICODERM CQ - DOSED IN MG/24 HOURS) 21 mg/24hr patch Place 1 patch (21 mg total) onto the skin daily. 28 patch 0  . Omega-3 Fatty Acids (FISH OIL OMEGA-3 PO) Take 540 mg by mouth daily.    . phenylephrine-shark liver oil-mineral oil-petrolatum (PREPARATION H) 0.25-3-14-71.9 % rectal ointment Place 1 application rectally 2 (two) times daily as needed for hemorrhoids.    .  promethazine (PHENERGAN) 25 MG tablet Take 25 mg by mouth every 6 (six) hours as needed for nausea or vomiting (for 5 days).    . shark liver oil-cocoa butter (PREPARATION H) 0.25-3-85.5 % suppository Place 1 suppository rectally 2 (two) times daily as needed for hemorrhoids.    . torsemide (DEMADEX) 20 MG tablet Take 20 mg by mouth as directed. Take as needed for swelling    . valsartan-hydrochlorothiazide (DIOVAN-HCT) 320-25 MG tablet Take 1 tablet by mouth daily.    Marland Kitchen zolpidem (AMBIEN) 5 MG tablet Take 5 mg by mouth at bedtime.     No facility-administered medications prior to visit.     PAST MEDICAL HISTORY: Past Medical History:  Diagnosis Date  . Arthritis    "a little in my right knee" (07/14/2016)  . CAD (coronary artery disease)   . Chronic congestive heart failure with left ventricular diastolic dysfunction ([place])   . Complication of anesthesia    Pt reports "they have a hard time waking me up"  . GERD (gastroesophageal reflux disease)    hx  . Headache    "with every heart issue that I have" (07/14/2016)  . High cholesterol   . History of hiatal hernia 1990s   "fixed it w/scope down my throat"  . Hypertension   . Myocardial infarction [facility]) 05/2013   stents: left PDA, 1st OM at [facility]  . Obesity   . Obstructive sleep apnea    previous test "inconclusive" per patient,  has appointment with [facility] 10/06/16 for evaluation  . Persistent atrial fibrillation ([place])   . Pneumonia ~ 1992  . Seasonal allergies    "I take Allegra prn" (07/14/2016)    PAST SURGICAL HISTORY: Past Surgical History:  Procedure Laterality Date  . APPENDECTOMY  1984  . CARDIOVERSION N/A 05/27/2016   Procedure: CARDIOVERSION;  Surgeon: [name], MD;  Location: [facility] ENDOSCOPY;  Service: Cardiovascular;  Laterality: N/A;  . CARDIOVERSION N/A 06/11/2016   Procedure: CARDIOVERSION;  Surgeon: [name], MD;  Location: [place];  Service: Cardiovascular;  Laterality: N/A;  .  CORONARY ANGIOPLASTY WITH STENT PLACEMENT  05/30/2013   "2 stents put in at [facility]" & /stent card  . ELECTROPHYSIOLOGIC STUDY N/A 11/13/2016   Procedure: Atrial Fibrillation Ablation;  Surgeon: [name], MD;  Location: [place] CV LAB;  Service: Cardiovascular;  Laterality: N/A;  . ESOPHAGOGASTRODUODENOSCOPY (EGD) WITH ESOPHAGEAL DILATION  1990s X 2  . KNEE ARTHROSCOPY Right 1986; ~ 1995 X 2  . LAPAROSCOPIC CHOLECYSTECTOMY  2015  . REPAIR OF ESOPHAGUS  1998   perforation of the distal esophagus from bad heartburn  . TEE WITHOUT CARDIOVERSION N/A 11/13/2016   Procedure: TRANSESOPHAGEAL ECHOCARDIOGRAM (TEE);  Surgeon: Thayer Headings, MD;  Location: [facility] ENDOSCOPY;  Service: Cardiovascular;  Laterality: N/A;    FAMILY HISTORY: Family History  Problem Relation Age of Onset  . Heart disease Mother 27    CABG age 55  . Breast cancer Mother   . Tongue cancer Mother   . Diabetes Mother   . Heart attack Father 23    Father  had around 7 heart attacks per pt  . Diabetes Father   . Lung cancer Father   . Congestive Heart Failure Father   . Lung cancer Paternal Uncle     SOCIAL HISTORY: Social History   Social History  . Marital status: Married    Spouse name: N/A  . Number of children: N/A  . Years of education: N/A   Occupational History  . Not on file.   Social History Main Topics  . Smoking status: Former Smoker    Packs/day: 1.00    Years: 25.00    Types: Cigarettes, E-cigarettes    Quit date: 12/17/2016  . Smokeless tobacco: Never Used     Comment: As of 09/29/16-smoking 6-7 cigarettes a day  . Alcohol use No  . Drug use: No  . Sexual activity: Yes   Other Topics Concern  . Not on file   Social History Narrative   Pt lives in [place] with spouse and 40 year old son.   Works Nurse, adult for [employer].     PHYSICAL EXAM  Vitals:   02/18/17 1421  BP: 122/82  Pulse: (!) 56  Weight: 251 lb 3.2 oz (113.9 kg)  Height: 5\' 9"  (1.753 m)   Body  mass index is 37.1 kg/m.  Generalized: Well developed,Obese male in no acute distress  Head: normocephalic and atraumatic,. Oropharynx benign  Neck: Supple, no carotid bruits  Cardiac: Regular rate rhythm, no murmur  Musculoskeletal: No deformity   Neurological examination   Mentation: Alert oriented to time, place, history taking. Attention span and concentration appropriate. Recent and remote memory intact.  Follows all commands speech and language fluent.   Cranial nerve II-XII: Pupils were equal round reactive to light extraocular movements were full, visual field were full on confrontational test. Facial sensation and strength were normal. hearing was intact to finger rubbing bilaterally. Uvula tongue midline. head turning and shoulder shrug were normal and symmetric.Tongue protrusion into cheek strength was normal. Motor: normal bulk and tone, full strength in the BUE, BLE, fine finger movements normal, no pronator drift. No focal weakness Sensory: normal and symmetric to light touch, pinprick, and  Vibration, in the upper and lower extremities Coordination: finger-nose-finger, heel-to-shin bilaterally, no dysmetria Reflexes: 1+ upper lower and symmetric, plantar responses were flexor bilaterally. Gait and Station: Rising up from seated position without assistance, normal stance,  moderate stride, good arm swing, smooth turning, able to perform tiptoe, and heel walking without difficulty. Tandem gait is mildly unsteady  DIAGNOSTIC DATA (LABS, IMAGING, TESTING) - I reviewed patient records, labs, notes, testing and imaging myself where available.  Lab Results  Component Value Date   WBC 10.0 12/16/2016   HGB 16.0 12/16/2016   HCT 47.0 12/16/2016   MCV 84.1 12/16/2016   PLT 243 12/16/2016      Component Value Date/Time   NA 139 12/16/2016 0339   K 3.3 (L) 12/16/2016 0339   CL 103 12/16/2016 0339   CO2 23 12/16/2016 0335   GLUCOSE 119 (H) 12/16/2016 0339   BUN 15 12/16/2016  0339   CREATININE 0.80 12/16/2016 0339   CREATININE 0.79 10/29/2016 1108   CALCIUM 9.1 12/16/2016 0335   PROT 6.7 12/16/2016 0335   ALBUMIN 3.3 (L) 12/16/2016 0335   AST 25 12/16/2016 0335   ALT 26 12/16/2016 0335   ALKPHOS 88 12/16/2016 0335   BILITOT 0.2 (L) 12/16/2016 0335   GFRNONAA >60 12/16/2016 0335   GFRAA >60 12/16/2016 0335   Lab Results  Component  Value Date   CHOL 230 (H) 12/16/2016   HDL 54 12/16/2016   LDLCALC 141 (H) 12/16/2016   TRIG 175 (H) 12/16/2016   CHOLHDL 4.3 12/16/2016   Lab Results  Component Value Date   HGBA1C 6.0 (H) 12/16/2016    ASSESSMENT AND PLAN  51 y.o. year old male  has a past medical history of  CAD (coronary artery disease); Chronic congestive heart failure with left ventricular diastolic dysfunction ([place]);  Headache; High cholesterol; Hypertension; Myocardial infarction ([place]) (05/2013); Obesity; Obstructive sleep apnea; Persistent atrial fibrillation ([place]); here for follow-up Hospital follow-up for left brain TIA with risk factors of atrial fibrillation hypertension hyperlipidemia smoking and obstructive sleep apnea  PLAN: Stressed the importance of management of risk factors to prevent further TIA/stroke Continue aspirin .81mg  and eliquis for secondary stroke prevention and atrial fibrilation facies are Maintain strict control of hypertension with blood pressure goal below 130/90, today's reading 122/82 continue antihypertensive medications Cholesterol with LDL cholesterol less than 100,   most recent 141 continue Zetia and  fish oil  Exercise by walking, slowly increase , eat healthy diet with whole grains,  fresh fruits and vegetables limit salt Congratulations on stopping smoking  F/U in 4 months Discussed risk for recurrent stroke/ TIA and answered additional questions This was a visit requiring 30 minutes and medical decision making of high complexity with extensive review of history, hospital chart, counseling and answering questions  for patient and wife Dennie Bible, [facility], [facility], [facility] [address], [place] [address] [phone]

## 2017-02-18 NOTE — Patient Instructions (Addendum)
Stressed the importance of management of risk factors to prevent further TIA/stroke Continue aspirin .81mg  and eliquis for secondary stroke prevention and atrial fibrilation facies are Maintain strict control of hypertension with blood pressure goal below 130/90, today's reading 122/82 continue antihypertensive medications Cholesterol with LDL cholesterol less than 100,   most recent 141 continue Zetia and  fish oil  Exercise by walking, slowly increase , eat healthy diet with whole grains,  fresh fruits and vegetables limit salt Congratulations on stopping smoking  F/U in 4 months Stroke Prevention Some medical conditions and behaviors are associated with an increased chance of having a stroke. You may prevent a stroke by making healthy choices and managing medical conditions. How can I reduce my risk of having a stroke?  Stay physically active. Get at least 30 minutes of activity on most or all days.  Do not smoke. It may also be helpful to avoid exposure to secondhand smoke.  Limit alcohol use. Moderate alcohol use is considered to be:  No more than 2 drinks per day for men.  No more than 1 drink per day for nonpregnant women.  Eat healthy foods. This involves:  Eating 5 or more servings of fruits and vegetables a day.  Making dietary changes that address high blood pressure (hypertension), high cholesterol, diabetes, or obesity.  Manage your cholesterol levels.  Making food choices that are high in fiber and low in saturated fat, trans fat, and cholesterol may control cholesterol levels.  Take any prescribed medicines to control cholesterol as directed by your health care provider.  Manage your diabetes.  Controlling your carbohydrate and sugar intake is recommended to manage diabetes.  Take any prescribed medicines to control diabetes as directed by your health care provider.  Control your hypertension.  Making food choices that are low in salt (sodium), saturated fat,  trans fat, and cholesterol is recommended to manage hypertension.  Ask your health care provider if you need treatment to lower your blood pressure. Take any prescribed medicines to control hypertension as directed by your health care provider.  If you are 2-60 years of age, have your blood pressure checked every 3-5 years. If you are 84 years of age or older, have your blood pressure checked every year.  Maintain a healthy weight.  Reducing calorie intake and making food choices that are low in sodium, saturated fat, trans fat, and cholesterol are recommended to manage weight.  Stop drug abuse.  Avoid taking birth control pills.  Talk to your health care provider about the risks of taking birth control pills if you are over 69 years old, smoke, get migraines, or have ever had a blood clot.  Get evaluated for sleep disorders (sleep apnea).  Talk to your health care provider about getting a sleep evaluation if you snore a lot or have excessive sleepiness.  Take medicines only as directed by your health care provider.  For some people, aspirin or blood thinners (anticoagulants) are helpful in reducing the risk of forming abnormal blood clots that can lead to stroke. If you have the irregular heart rhythm of atrial fibrillation, you should be on a blood thinner unless there is a good reason you cannot take them.  Understand all your medicine instructions.  Make sure that other conditions (such as anemia or atherosclerosis) are addressed. Get help right away if:  You have sudden weakness or numbness of the face, arm, or leg, especially on one side of the body.  Your face or eyelid droops to  one side.  You have sudden confusion.  You have trouble speaking (aphasia) or understanding.  You have sudden trouble seeing in one or both eyes.  You have sudden trouble walking.  You have dizziness.  You have a loss of balance or coordination.  You have a sudden, severe headache with no  known cause.  You have new chest pain or an irregular heartbeat. Any of these symptoms may represent a serious problem that is an emergency. Do not wait to see if the symptoms will go away. Get medical help at once. Call your local emergency services (911 in [place] Do not drive yourself to the hospital. This information is not intended to replace advice given to you by your health care provider. Make sure you discuss any questions you have with your health care provider. Document Released: 11/27/2004 Document Revised: 03/27/2016 Document Reviewed: 04/22/2013 Elsevier Interactive Patient Education  2017 [employer].

## 2017-02-19 NOTE — Progress Notes (Signed)
I reviewed above note and agree with the assessment and plan.  Please contact Katrina to start PCSK9 inhibitor, i.e. Repatha, prior authorization. Thanks.   Rosalin Hawking, MD PhD Stroke Neurology 02/19/2017 5:57 PM

## 2017-05-04 ENCOUNTER — Ambulatory Visit: Payer: BLUE CROSS/BLUE SHIELD | Admitting: Internal Medicine

## 2017-05-08 ENCOUNTER — Encounter: Payer: Self-pay | Admitting: Internal Medicine

## 2017-08-09 IMAGING — MR MR MRA HEAD W/O CM
9 of 12 series · 32 of 48 positions shown · non-contrast
Comparison: CT study same day

CLINICAL DATA: History of atrial fibrillation. Acute presentation
with aphasia and right-sided weakness which began 4 hours ago.

EXAM:
MRI HEAD WITHOUT CONTRAST
MRA HEAD WITHOUT CONTRAST
TECHNIQUE: Multiplanar, multiecho pulse sequences of the brain and surrounding
structures were obtained without intravenous contrast. Angiographic
images of the head were obtained using MRA technique without
contrast.

[Series 4: T1 · sagittal · 5.0mm · 0.47mm/px · 1 of 23 slices shown]
[im 1/23]
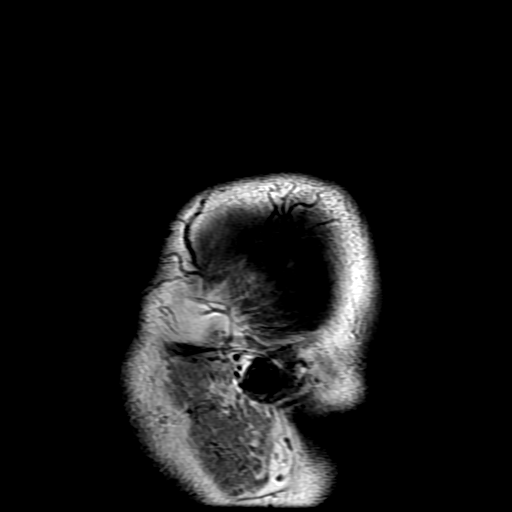

[Series 5: DWI · axial · 3.0mm · 1.09mm/px · z∈[-75,+63]mm · 7 of 94 slices shown (1 of 4)]
[im 1/94]
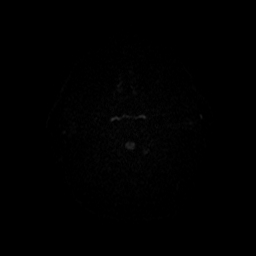
[im 16/94]
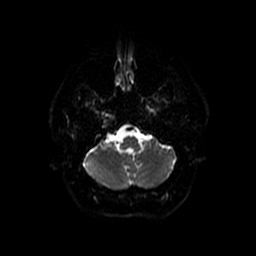
[im 32/94]
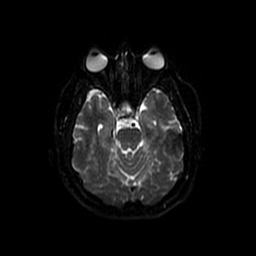
[im 47/94]
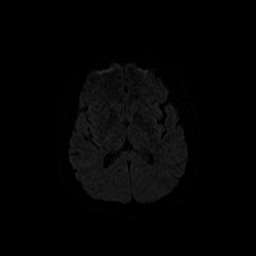
[im 63/94]
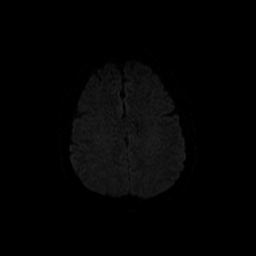
[im 78/94]
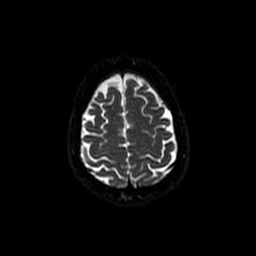
[im 94/94]
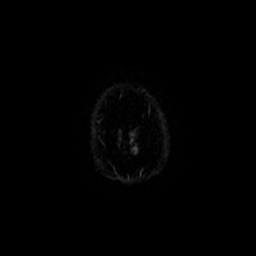

[Series 6: (id) mt fs · axial · 1.4mm · 0.43mm/px · z∈[-65,+0]mm · 6 of 136 slices shown]
[im 1/136]
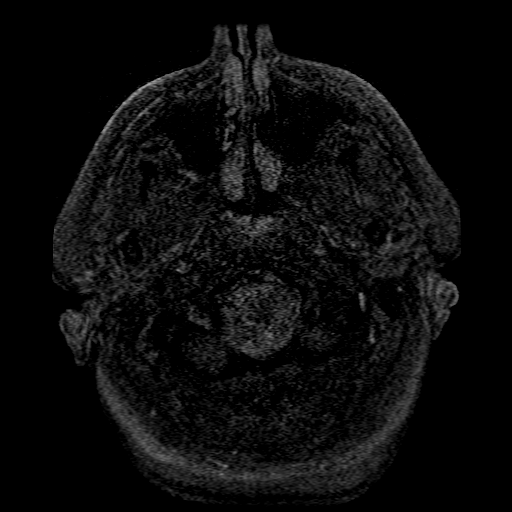
[im 28/136]
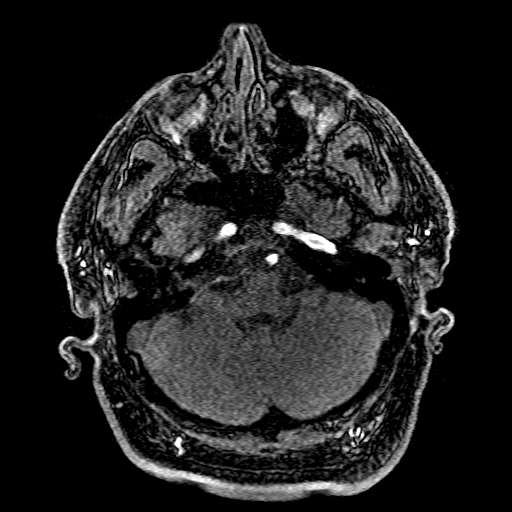
[im 41/136]
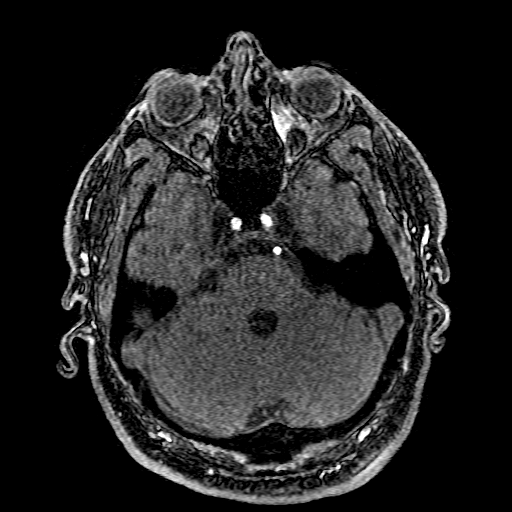
[im 55/136]
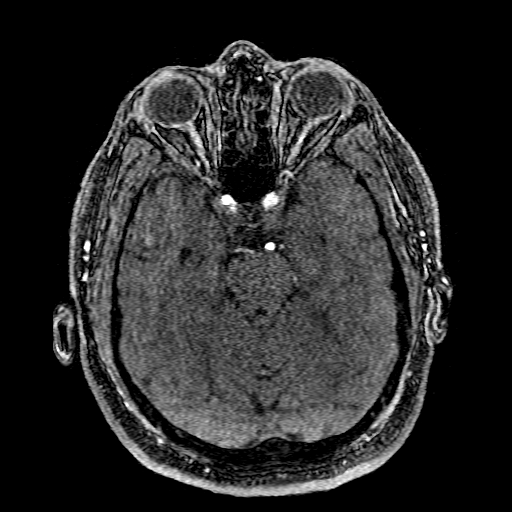
[im 82/136]
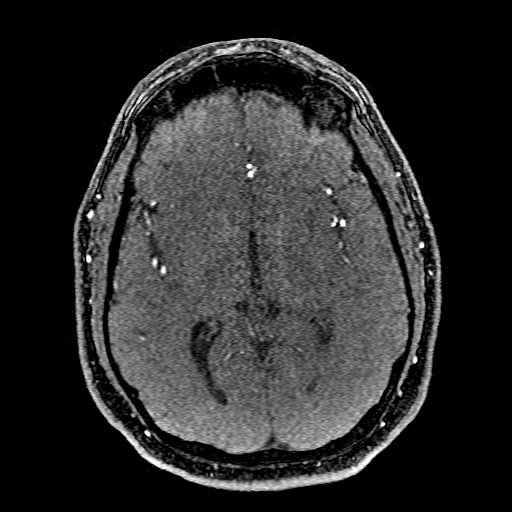
[im 95/136]
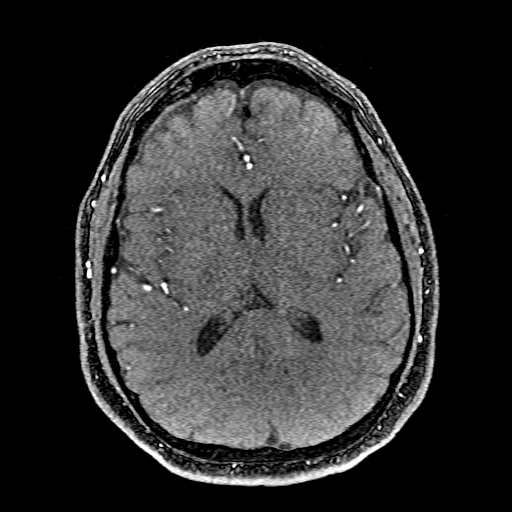

[Series 7: T2 · axial · 5.0mm · 0.43mm/px · z∈[-77,+61]mm · 2 of 24 slices shown (1 of 2)]
[im 1/24]
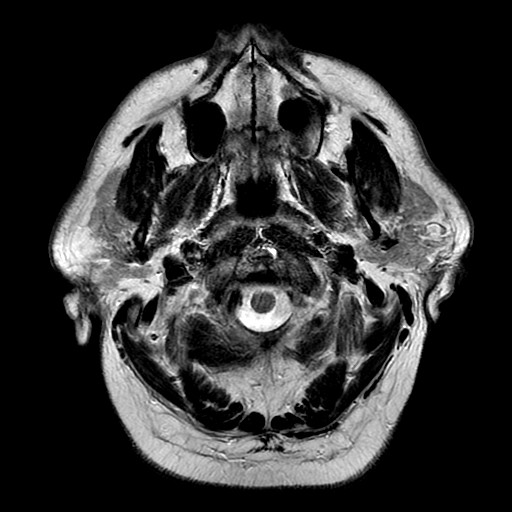
[im 24/24]
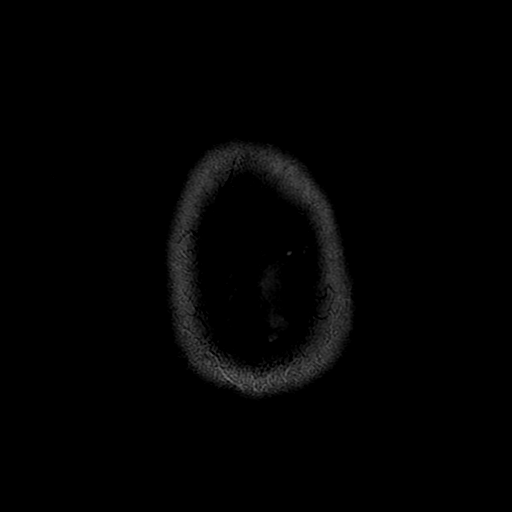

[Series 8: FLAIR · axial · 5.0mm · 0.43mm/px · z∈[-77,+61]mm · 2 of 24 slices shown]
[im 1/24]
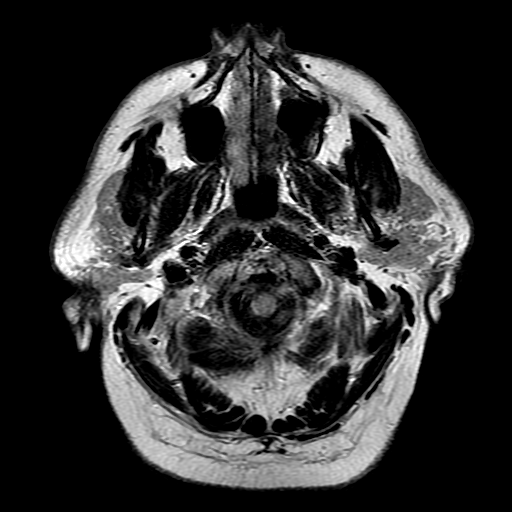
[im 24/24]
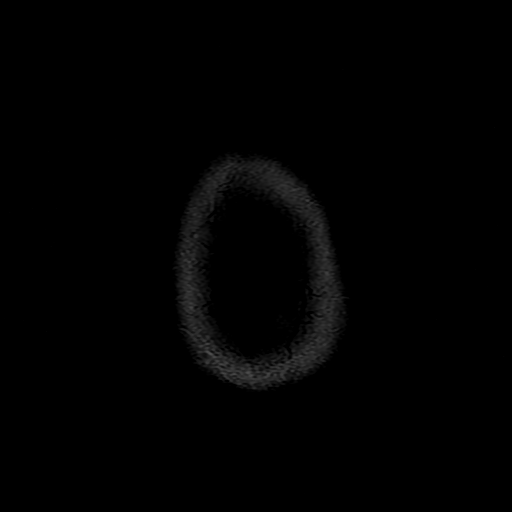

[Series 9: DWI · coronal · 5.0mm · 1.09mm/px · 5 of 66 slices shown (2 of 4)]
[im 1/66]
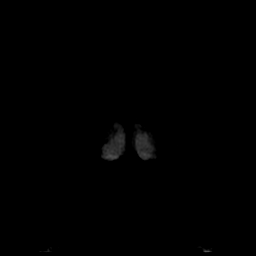
[im 17/66]
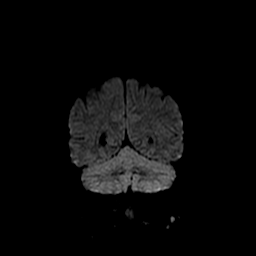
[im 33/66]
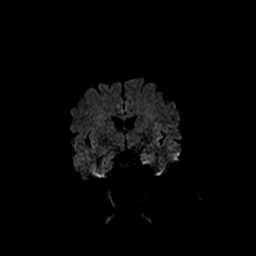
[im 49/66]
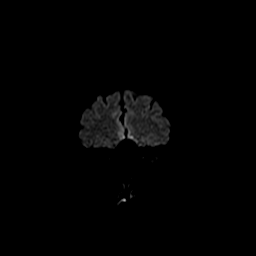
[im 66/66]
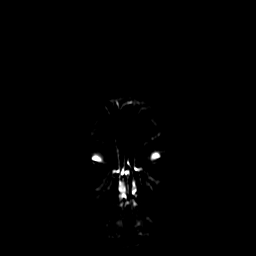

[Series 12: T2 · coronal · 5.0mm · 0.39mm/px · 2 of 25 slices shown (2 of 2)]
[im 1/25]
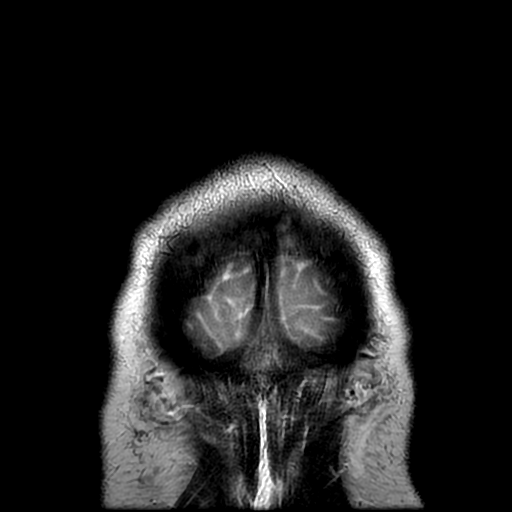
[im 25/25]
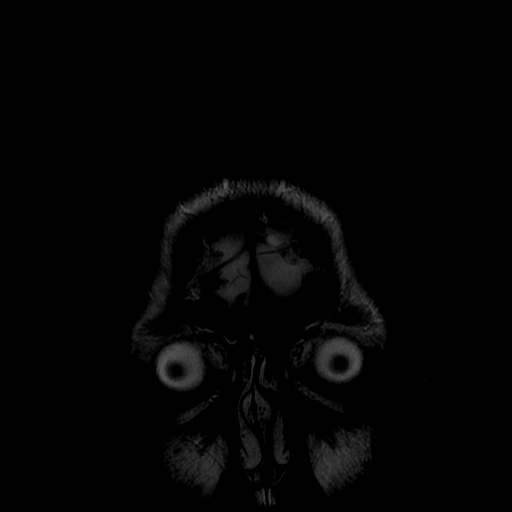

[Series 500: DWI · axial · 3.0mm · 1.09mm/px · z∈[-75,+63]mm · 4 of 47 slices shown (3 of 4)]
[im 1/47]
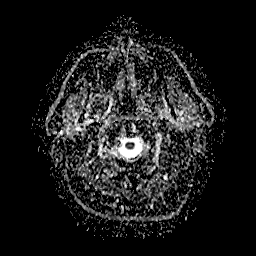
[im 16/47]
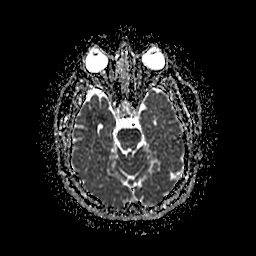
[im 31/47]
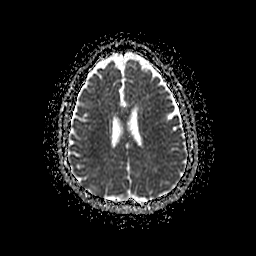
[im 47/47]
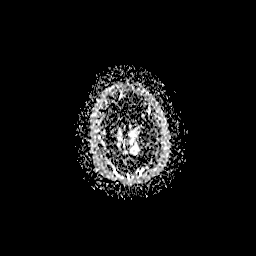

[Series 900: DWI · coronal · 5.0mm · 1.09mm/px · 3 of 33 slices shown (4 of 4)]
[im 1/33]
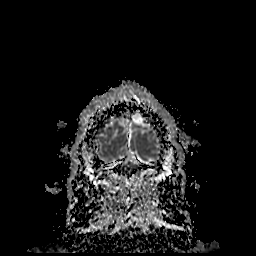
[im 17/33]
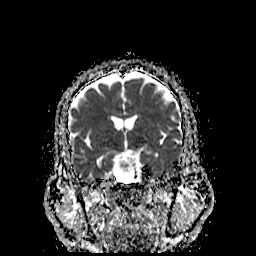
[im 33/33]
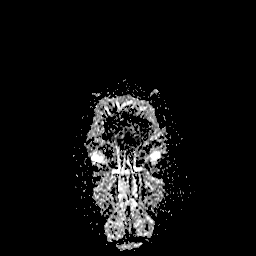

[32 of 48 positions shown; findings below may reference images not displayed]

FINDINGS: MRI HEAD FINDINGS

Brain: The brain has normal appearance without evidence of
malformation, atrophy, old or acute small or large vessel
infarction, hemorrhage, hydrocephalus or extra-axial collection. No
pituitary abnormality.

Vascular: Major vessels at the base of the brain show flow.

Skull and upper cervical spine: Normal

Sinuses/Orbits: Clear/ normal.

Other: None significant.

MRA HEAD FINDINGS

Both internal carotid arteries are widely patent into the brain. No
siphon stenosis. The anterior and middle cerebral vessels are patent
without proximal stenosis, aneurysm or vascular malformation.

Both vertebral arteries are widely patent to the basilar. Distal
left vertebral artery is diminutive. This is favored to be
congenital. Acquired stenotic disease felt less likely. No basilar
stenosis. Posterior circulation branch vessels appear normal.
IMPRESSION: Normal appearance of the brain itself. No evidence of old or acute
insult.

Normal intracranial MR angiography of the large and medium size
vessels. The distal left vertebral artery is diminutive, which I
favor as being congenital rather than acquired.

## 2017-08-09 IMAGING — CT CT HEAD CODE STROKE
3 series · 15 of 47 positions shown, 18 images · non-contrast
Comparison: CT HEAD May 27, 2013

CLINICAL DATA: Code stroke. LEFT upper extremity arm tingling.
History of hypertension, hypercholesterolemia, atrial fibrillation.

EXAM:
CT HEAD WITHOUT CONTRAST
TECHNIQUE: Contiguous axial images were obtained from the base of the skull
through the vertex without intravenous contrast.

[Series 2: head 5.0 st · axial · 0.43mm/px · z∈[-113,+17]mm · 9 of 32 slices shown, 12 images]
[im 3/32  brain]
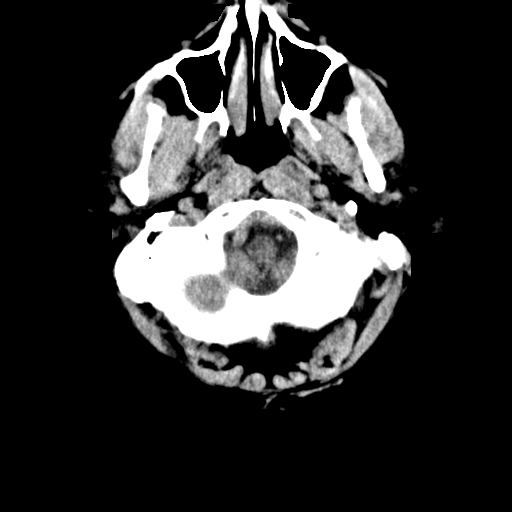
[im 3/32  bone]
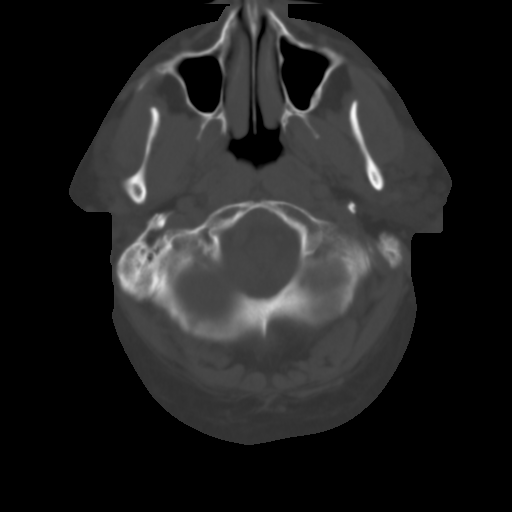
[im 6/32  brain]
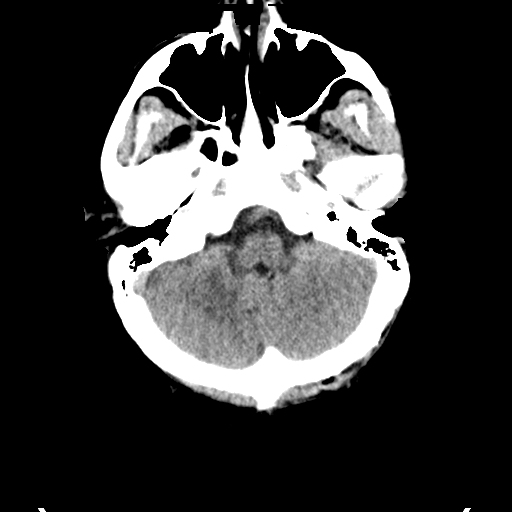
[im 9/32  brain]
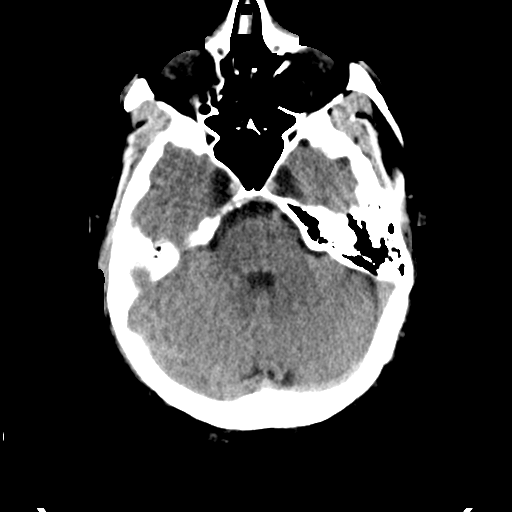
[im 12/32  brain]
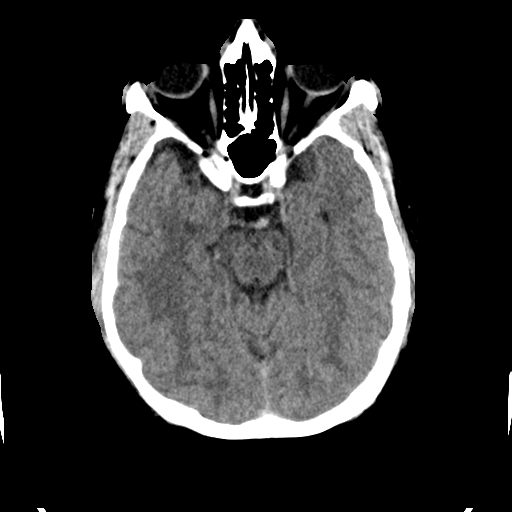
[im 17/32  brain]
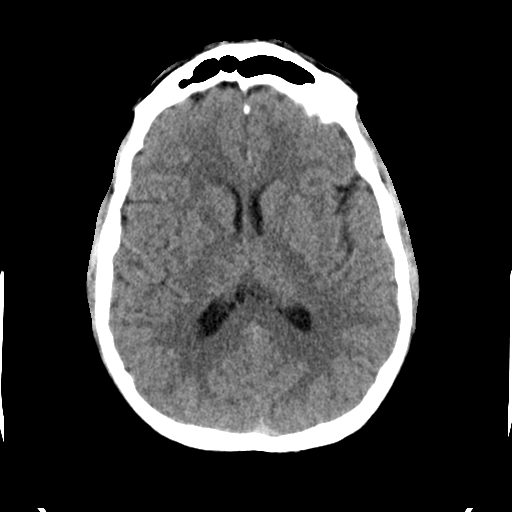
[im 17/32  bone]
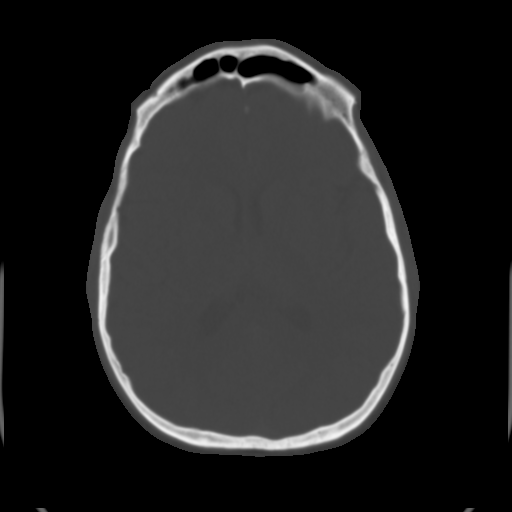
[im 20/32  brain]
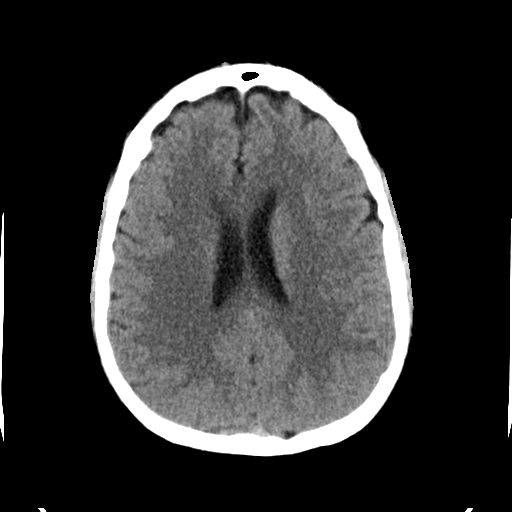
[im 23/32  brain]
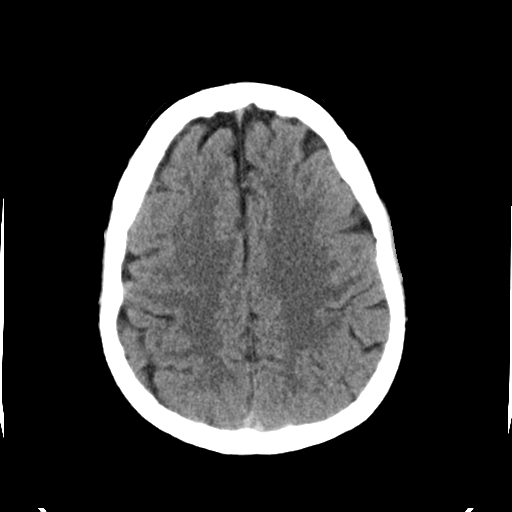
[im 26/32  brain]
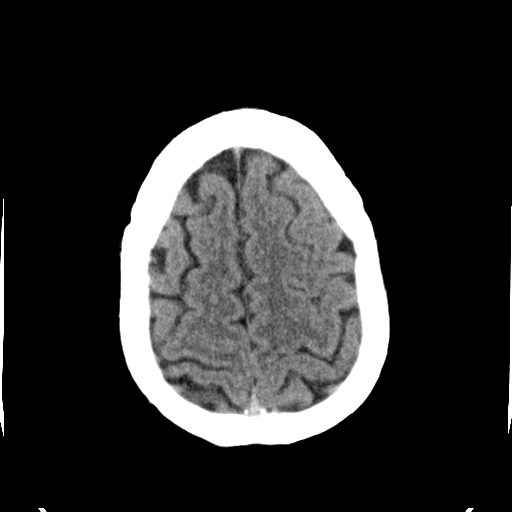
[im 29/32  brain]
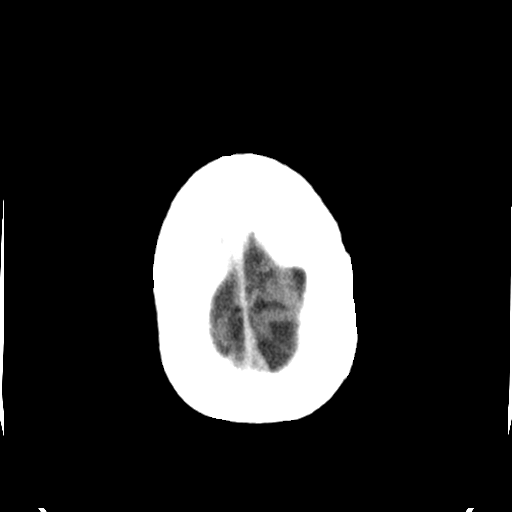
[im 29/32  bone]
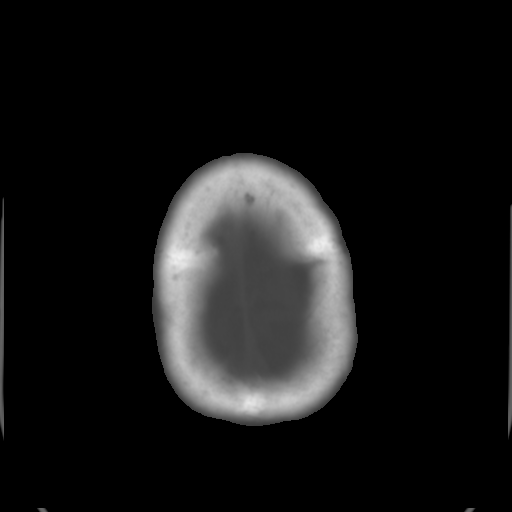

[Series 4: head 3.0 cor st · coronal · 0.31mm/px · 3 of 64 slices shown]
[im 22/64  brain]
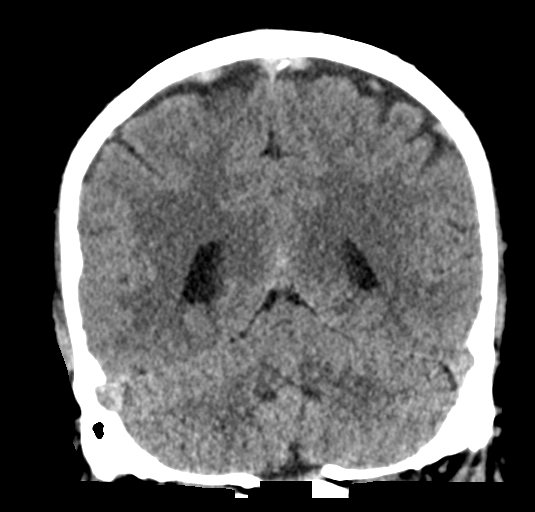
[im 29/64  brain]
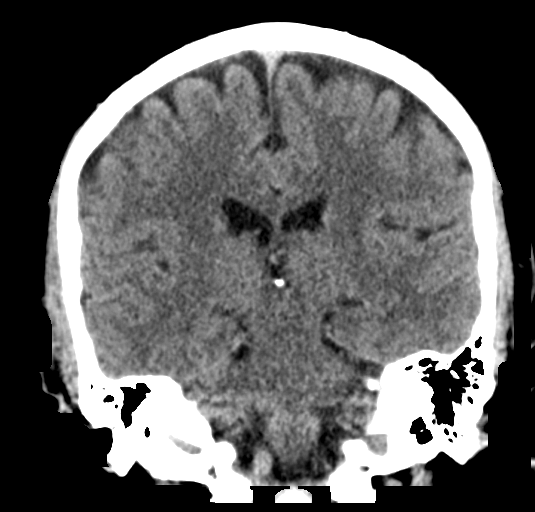
[im 36/64  brain]
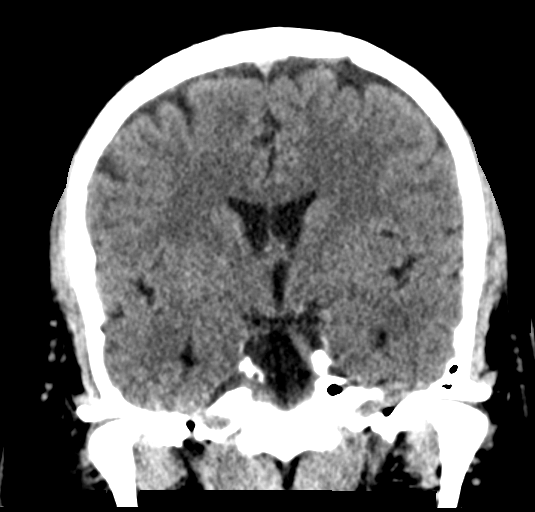

[Series 5: head 3.0 sag st · sagittal · 0.31mm/px · 3 of 55 slices shown]
[im 19/55  brain]
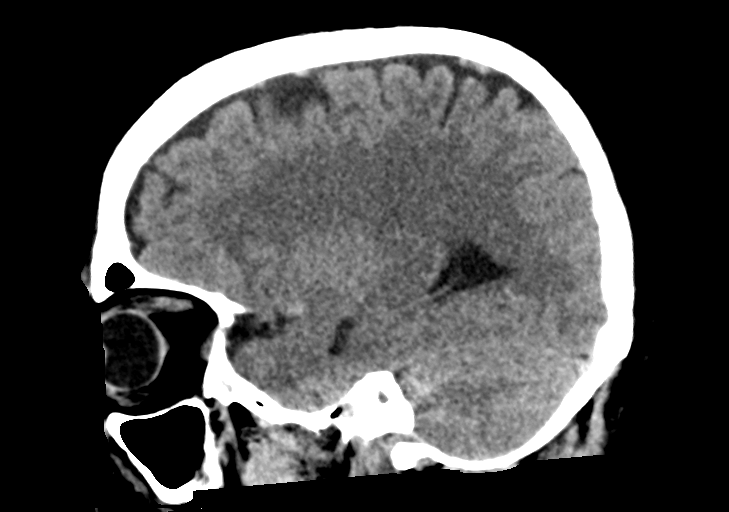
[im 28/55  brain]
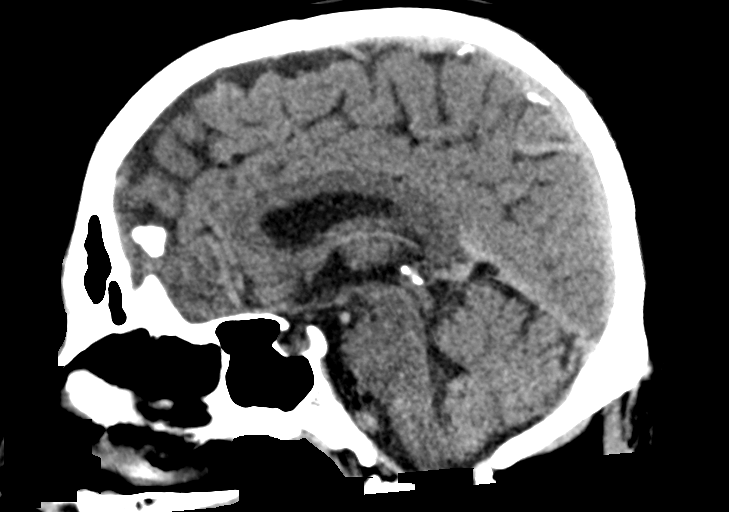
[im 37/55  brain]
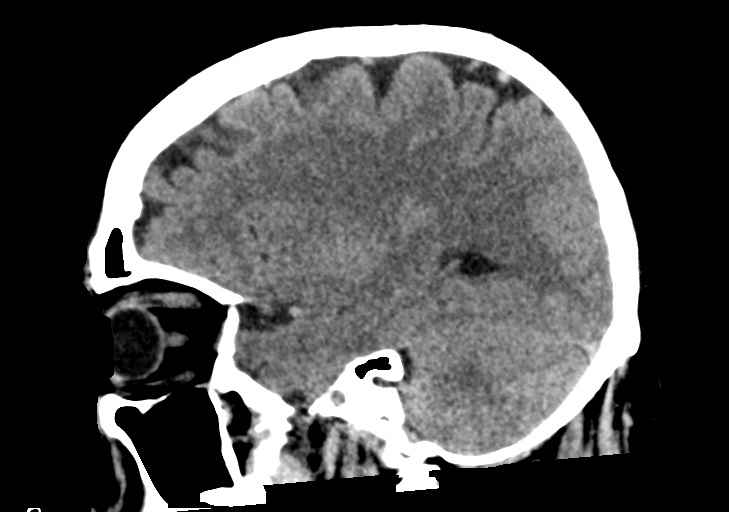

[15 of 47 positions shown; findings below may reference images not displayed]

FINDINGS: BRAIN: The ventricles and sulci are normal. No intraparenchymal
hemorrhage, mass effect nor midline shift. No acute large vascular
territory infarcts. No abnormal extra-axial fluid collections. Basal
cisterns are patent.

VASCULAR: Trace calcific atherosclerosis of the carotid siphons.
Dense arterial and venous structures compatible with
hemoconcentration.

SKULL/SOFT TISSUES: No skull fracture. No significant soft tissue
swelling.

ORBITS/SINUSES: The included ocular globes and orbital contents are
normal.The mastoid aircells and included paranasal sinuses are
well-aerated.

OTHER: None.

ASPECTS (Alberta Stroke Program Early CT Score)

- Ganglionic level infarction (caudate, lentiform nuclei, internal
capsule, insula, M1-M3 cortex): 7

- Supraganglionic infarction (M4-M6 cortex): 3

Total score (0-10 with 10 being normal): 10
IMPRESSION: 1. Negative CT HEAD.
2. ASPECTS is 10.
3. Critical Value/emergent results were called by telephone at the
time of interpretation on 12/16/2016 at [DATE] to Dr. Jaelyn,
Neurology who verbally acknowledged these results.

## 2017-11-27 ENCOUNTER — Other Ambulatory Visit: Payer: Self-pay

## 2017-11-27 ENCOUNTER — Inpatient Hospital Stay (HOSPITAL_COMMUNITY)
Admission: AD | Disposition: A | Discharge status: Home / Self Care | Payer: Self-pay | Source: Other Acute Inpatient Hospital | Attending: Cardiology

## 2017-11-27 ENCOUNTER — Inpatient Hospital Stay (HOSPITAL_COMMUNITY)
Admission: AD | Admit: 2017-11-27 | Discharge: 2017-11-28 | DRG: 247 | Disposition: A | Discharge status: Home / Self Care | Payer: BLUE CROSS/BLUE SHIELD | Source: Other Acute Inpatient Hospital | Attending: Cardiology | Admitting: Cardiology

## 2017-11-27 ENCOUNTER — Encounter (HOSPITAL_COMMUNITY): Payer: Self-pay

## 2017-11-27 ENCOUNTER — Inpatient Hospital Stay (HOSPITAL_COMMUNITY): Payer: BLUE CROSS/BLUE SHIELD

## 2017-11-27 DIAGNOSIS — I209 Angina pectoris, unspecified: Secondary | ICD-10-CM | POA: Diagnosis present

## 2017-11-27 DIAGNOSIS — Z888 Allergy status to other drugs, medicaments and biological substances status: Secondary | ICD-10-CM

## 2017-11-27 DIAGNOSIS — Z87891 Personal history of nicotine dependence: Secondary | ICD-10-CM

## 2017-11-27 DIAGNOSIS — I25110 Atherosclerotic heart disease of native coronary artery with unstable angina pectoris: Principal | ICD-10-CM | POA: Diagnosis present

## 2017-11-27 DIAGNOSIS — E669 Obesity, unspecified: Secondary | ICD-10-CM | POA: Diagnosis present

## 2017-11-27 DIAGNOSIS — Z8249 Family history of ischemic heart disease and other diseases of the circulatory system: Secondary | ICD-10-CM | POA: Diagnosis not present

## 2017-11-27 DIAGNOSIS — Z6833 Body mass index (BMI) 33.0-33.9, adult: Secondary | ICD-10-CM | POA: Diagnosis not present

## 2017-11-27 DIAGNOSIS — R739 Hyperglycemia, unspecified: Secondary | ICD-10-CM | POA: Diagnosis present

## 2017-11-27 DIAGNOSIS — Z7901 Long term (current) use of anticoagulants: Secondary | ICD-10-CM | POA: Diagnosis not present

## 2017-11-27 DIAGNOSIS — Z9049 Acquired absence of other specified parts of digestive tract: Secondary | ICD-10-CM

## 2017-11-27 DIAGNOSIS — Z885 Allergy status to narcotic agent status: Secondary | ICD-10-CM

## 2017-11-27 DIAGNOSIS — E7800 Pure hypercholesterolemia, unspecified: Secondary | ICD-10-CM | POA: Diagnosis present

## 2017-11-27 DIAGNOSIS — I480 Paroxysmal atrial fibrillation: Secondary | ICD-10-CM

## 2017-11-27 DIAGNOSIS — Z8673 Personal history of transient ischemic attack (TIA), and cerebral infarction without residual deficits: Secondary | ICD-10-CM | POA: Diagnosis not present

## 2017-11-27 DIAGNOSIS — Z881 Allergy status to other antibiotic agents status: Secondary | ICD-10-CM

## 2017-11-27 DIAGNOSIS — Z79899 Other long term (current) drug therapy: Secondary | ICD-10-CM | POA: Diagnosis not present

## 2017-11-27 DIAGNOSIS — I252 Old myocardial infarction: Secondary | ICD-10-CM

## 2017-11-27 DIAGNOSIS — Z8701 Personal history of pneumonia (recurrent): Secondary | ICD-10-CM | POA: Diagnosis not present

## 2017-11-27 DIAGNOSIS — Z7982 Long term (current) use of aspirin: Secondary | ICD-10-CM | POA: Diagnosis not present

## 2017-11-27 DIAGNOSIS — I110 Hypertensive heart disease with heart failure: Secondary | ICD-10-CM | POA: Diagnosis present

## 2017-11-27 DIAGNOSIS — Z955 Presence of coronary angioplasty implant and graft: Secondary | ICD-10-CM | POA: Diagnosis not present

## 2017-11-27 DIAGNOSIS — I5032 Chronic diastolic (congestive) heart failure: Secondary | ICD-10-CM | POA: Diagnosis present

## 2017-11-27 DIAGNOSIS — J302 Other seasonal allergic rhinitis: Secondary | ICD-10-CM | POA: Diagnosis present

## 2017-11-27 DIAGNOSIS — Z833 Family history of diabetes mellitus: Secondary | ICD-10-CM

## 2017-11-27 DIAGNOSIS — I10 Essential (primary) hypertension: Secondary | ICD-10-CM

## 2017-11-27 DIAGNOSIS — I200 Unstable angina: Secondary | ICD-10-CM | POA: Diagnosis present

## 2017-11-27 HISTORY — PX: LEFT HEART CATH AND CORONARY ANGIOGRAPHY: CATH118249

## 2017-11-27 HISTORY — PX: CORONARY STENT INTERVENTION: CATH118234

## 2017-11-27 LAB — TROPONIN I
TROPONIN I: 0.04 ng/mL — AB (ref <0.03)
Troponin I: <0.03 ng/mL (ref <0.03)

## 2017-11-27 LAB — CBC
HEMATOCRIT: 40.2 % (ref 39.0–52.0)
Hemoglobin: 13.6 g/dL (ref 13.0–17.0)
MCH: 28.6 pg (ref 26.0–34.0)
MCHC: 33.8 g/dL (ref 30.0–36.0)
MCV: 84.5 fL (ref 78.0–100.0)
Platelets: 237 10*3/uL (ref 150–400)
RBC: 4.76 MIL/uL (ref 4.22–5.81)
RDW: 13.1 % (ref 11.5–15.5)
WBC: 10.8 10*3/uL — ABNORMAL HIGH (ref 4.0–10.5)

## 2017-11-27 LAB — POCT ACTIVATED CLOTTING TIME: ACTIVATED CLOTTING TIME: 252 s

## 2017-11-27 LAB — BASIC METABOLIC PANEL
Anion gap: 11 (ref 5–15)
BUN: 14 mg/dL (ref 6–20)
CO2: 24 mmol/L (ref 22–32)
CREATININE: 0.86 mg/dL (ref 0.61–1.24)
Calcium: 7.8 mg/dL — ABNORMAL LOW (ref 8.9–10.3)
Chloride: 105 mmol/L (ref 101–111)
GFR calc Af Amer: >60 mL/min (ref >60)
GLUCOSE: 94 mg/dL (ref 65–99)
POTASSIUM: 2.9 mmol/L — AB (ref 3.5–5.1)
Sodium: 140 mmol/L (ref 135–145)

## 2017-11-27 LAB — ECHOCARDIOGRAM COMPLETE
Height: 69 in
Weight: 3654.34 oz

## 2017-11-27 LAB — MRSA PCR SCREENING: MRSA by PCR: NEGATIVE

## 2017-11-27 LAB — HEMOGLOBIN A1C
HEMOGLOBIN A1C: 6 % — AB (ref 4.8–5.6)
MEAN PLASMA GLUCOSE: 125.5 mg/dL

## 2017-11-27 LAB — PROTIME-INR
INR: 1.09
PROTHROMBIN TIME: 14 s (ref 11.4–15.2)

## 2017-11-27 LAB — LIPID PANEL
Cholesterol: 198 mg/dL (ref 0–200)
HDL: 40 mg/dL — ABNORMAL LOW (ref >40)
LDL CALC: 142 mg/dL — AB (ref 0–99)
Total CHOL/HDL Ratio: 5 RATIO
Triglycerides: 78 mg/dL (ref <150)
VLDL: 16 mg/dL (ref 0–40)

## 2017-11-27 LAB — HIV ANTIBODY (ROUTINE TESTING W REFLEX): HIV Screen 4th Generation wRfx: NONREACTIVE

## 2017-11-27 SURGERY — LEFT HEART CATH AND CORONARY ANGIOGRAPHY
Anesthesia: LOCAL

## 2017-11-27 MED ORDER — SODIUM CHLORIDE 0.9 % IV SOLN: 250.0000 mL | INTRAVENOUS | Status: DC | PRN

## 2017-11-27 MED ORDER — AMLODIPINE BESYLATE 5 MG PO TABS
5.0000 mg | ORAL_TABLET | Freq: Every day | ORAL | Status: DC
  Administered 2017-11-27 – 2017-11-28 (×2): 5 mg via ORAL
  Filled 2017-11-27 (×2): qty 1

## 2017-11-27 MED ORDER — DRONEDARONE HCL 400 MG PO TABS
400.0000 mg | ORAL_TABLET | Freq: Two times a day (BID) | ORAL | Status: DC
  Administered 2017-11-27 – 2017-11-28 (×2): 400 mg via ORAL
  Filled 2017-11-27 (×3): qty 1

## 2017-11-27 MED ORDER — MIDAZOLAM HCL 2 MG/2ML IJ SOLN
INTRAMUSCULAR | Status: AC
  Filled 2017-11-27: qty 2

## 2017-11-27 MED ORDER — FENTANYL CITRATE (PF) 100 MCG/2ML IJ SOLN
INTRAMUSCULAR | Status: DC | PRN
  Administered 2017-11-27: 25 ug via INTRAVENOUS

## 2017-11-27 MED ORDER — LORATADINE 10 MG PO TABS
10.0000 mg | ORAL_TABLET | Freq: Every day | ORAL | Status: DC
  Administered 2017-11-27 – 2017-11-28 (×2): 10 mg via ORAL
  Filled 2017-11-27 (×2): qty 1

## 2017-11-27 MED ORDER — NITROGLYCERIN 0.4 MG SL SUBL: 0.4000 mg | SUBLINGUAL_TABLET | SUBLINGUAL | Status: DC | PRN

## 2017-11-27 MED ORDER — VERAPAMIL HCL 2.5 MG/ML IV SOLN
INTRAVENOUS | Status: AC
  Filled 2017-11-27: qty 2

## 2017-11-27 MED ORDER — LIDOCAINE HCL (PF) 1 % IJ SOLN
INTRAMUSCULAR | Status: DC | PRN
  Administered 2017-11-27: 2 mL

## 2017-11-27 MED ORDER — POTASSIUM CHLORIDE 20 MEQ/15ML (10%) PO SOLN
40.0000 meq | Freq: Once | ORAL | Status: AC
Start: 2017-11-27 — End: 2017-11-27
  Administered 2017-11-27: 40 meq via ORAL
  Filled 2017-11-27: qty 30

## 2017-11-27 MED ORDER — VERAPAMIL HCL 2.5 MG/ML IV SOLN
INTRA_ARTERIAL | Status: DC | PRN
  Administered 2017-11-27: 7 mL via INTRA_ARTERIAL

## 2017-11-27 MED ORDER — HEPARIN (PORCINE) IN NACL 100-0.45 UNIT/ML-% IJ SOLN
1300.0000 [IU]/h | INTRAMUSCULAR | Status: DC
  Administered 2017-11-27: 1300 [IU]/h via INTRAVENOUS

## 2017-11-27 MED ORDER — VITAMIN D 1000 UNITS PO TABS
1000.0000 [IU] | ORAL_TABLET | Freq: Every day | ORAL | Status: DC
  Administered 2017-11-27 – 2017-11-28 (×2): 1000 [IU] via ORAL
  Filled 2017-11-27 (×4): qty 1

## 2017-11-27 MED ORDER — METOPROLOL TARTRATE 25 MG PO TABS: 25.0000 mg | ORAL_TABLET | Freq: Two times a day (BID) | ORAL | Status: DC

## 2017-11-27 MED ORDER — SODIUM CHLORIDE 0.9% FLUSH
3.0000 mL | Freq: Two times a day (BID) | INTRAVENOUS | Status: DC
  Administered 2017-11-27: 3 mL via INTRAVENOUS

## 2017-11-27 MED ORDER — SODIUM CHLORIDE 0.9% FLUSH: 3.0000 mL | INTRAVENOUS | Status: DC | PRN

## 2017-11-27 MED ORDER — SODIUM CHLORIDE 0.9 % WEIGHT BASED INFUSION
1.0000 mL/kg/h | INTRAVENOUS | Status: AC
  Administered 2017-11-27: 1 mL/kg/h via INTRAVENOUS

## 2017-11-27 MED ORDER — HEPARIN (PORCINE) IN NACL 2-0.9 UNIT/ML-% IJ SOLN
INTRAMUSCULAR | Status: AC
  Filled 2017-11-27: qty 1000

## 2017-11-27 MED ORDER — FISH OIL OMEGA-3 1000 MG PO CAPS: 540.0000 mg | ORAL_CAPSULE | Freq: Every day | ORAL | Status: DC

## 2017-11-27 MED ORDER — HEPARIN SODIUM (PORCINE) 1000 UNIT/ML IJ SOLN
INTRAMUSCULAR | Status: AC
  Filled 2017-11-27: qty 1

## 2017-11-27 MED ORDER — NITROGLYCERIN 1 MG/10 ML FOR IR/CATH LAB
INTRA_ARTERIAL | Status: AC
  Filled 2017-11-27: qty 10

## 2017-11-27 MED ORDER — LIDOCAINE HCL 1 % IJ SOLN
INTRAMUSCULAR | Status: AC
  Filled 2017-11-27: qty 20

## 2017-11-27 MED ORDER — OMEGA-3-ACID ETHYL ESTERS 1 G PO CAPS
1.0000 g | ORAL_CAPSULE | Freq: Two times a day (BID) | ORAL | Status: DC
  Administered 2017-11-27 – 2017-11-28 (×3): 1 g via ORAL
  Filled 2017-11-27 (×3): qty 1

## 2017-11-27 MED ORDER — CLOPIDOGREL BISULFATE 75 MG PO TABS
75.0000 mg | ORAL_TABLET | Freq: Every day | ORAL | Status: DC
  Administered 2017-11-28: 75 mg via ORAL
  Filled 2017-11-27: qty 1

## 2017-11-27 MED ORDER — NITROGLYCERIN 1 MG/10 ML FOR IR/CATH LAB
INTRA_ARTERIAL | Status: DC | PRN
  Administered 2017-11-27: 100 ug via INTRACORONARY

## 2017-11-27 MED ORDER — CLOPIDOGREL BISULFATE 300 MG PO TABS
ORAL_TABLET | ORAL | Status: DC | PRN
  Administered 2017-11-27: 600 mg via ORAL

## 2017-11-27 MED ORDER — VALSARTAN-HYDROCHLOROTHIAZIDE 320-25 MG PO TABS: 1.0000 | ORAL_TABLET | Freq: Every day | ORAL | Status: DC

## 2017-11-27 MED ORDER — ASPIRIN 300 MG RE SUPP: 300.0000 mg | RECTAL | Status: AC

## 2017-11-27 MED ORDER — HEPARIN SODIUM (PORCINE) 1000 UNIT/ML IJ SOLN
INTRAMUSCULAR | Status: DC | PRN
  Administered 2017-11-27 (×2): 3000 [IU] via INTRAVENOUS
  Administered 2017-11-27: 6000 [IU] via INTRAVENOUS

## 2017-11-27 MED ORDER — ACETAMINOPHEN 325 MG PO TABS: 650.0000 mg | ORAL_TABLET | ORAL | Status: DC | PRN

## 2017-11-27 MED ORDER — FENTANYL CITRATE (PF) 100 MCG/2ML IJ SOLN: 50.0000 ug | Freq: Once | INTRAMUSCULAR | Status: DC

## 2017-11-27 MED ORDER — IOPAMIDOL (ISOVUE-370) INJECTION 76%
INTRAVENOUS | Status: AC
  Filled 2017-11-27: qty 100

## 2017-11-27 MED ORDER — POTASSIUM CHLORIDE 20 MEQ/15ML (10%) PO SOLN
ORAL | Status: AC
  Filled 2017-11-27: qty 30

## 2017-11-27 MED ORDER — TOPIRAMATE ER 50 MG PO CAP24: 1.0000 | ORAL_CAPSULE | Freq: Every morning | ORAL | Status: DC

## 2017-11-27 MED ORDER — EZETIMIBE 10 MG PO TABS
10.0000 mg | ORAL_TABLET | Freq: Every day | ORAL | Status: DC
  Administered 2017-11-27 – 2017-11-28 (×2): 10 mg via ORAL
  Filled 2017-11-27 (×2): qty 1

## 2017-11-27 MED ORDER — CLOPIDOGREL BISULFATE 300 MG PO TABS
ORAL_TABLET | ORAL | Status: AC
  Filled 2017-11-27: qty 2

## 2017-11-27 MED ORDER — HYDROCHLOROTHIAZIDE 25 MG PO TABS
25.0000 mg | ORAL_TABLET | Freq: Every day | ORAL | Status: DC
  Administered 2017-11-27 – 2017-11-28 (×2): 25 mg via ORAL
  Filled 2017-11-27 (×2): qty 1

## 2017-11-27 MED ORDER — IRBESARTAN 300 MG PO TABS
300.0000 mg | ORAL_TABLET | Freq: Every day | ORAL | Status: DC
  Administered 2017-11-27 – 2017-11-28 (×2): 300 mg via ORAL
  Filled 2017-11-27 (×2): qty 1

## 2017-11-27 MED ORDER — HEPARIN (PORCINE) IN NACL 2-0.9 UNIT/ML-% IJ SOLN
INTRAMUSCULAR | Status: AC | PRN
  Administered 2017-11-27: 1000 mL

## 2017-11-27 MED ORDER — MIDAZOLAM HCL 2 MG/2ML IJ SOLN
INTRAMUSCULAR | Status: DC | PRN
Start: 2017-11-27 — End: 2017-11-27
  Administered 2017-11-27: 2 mg via INTRAVENOUS

## 2017-11-27 MED ORDER — BISOPROLOL FUMARATE 10 MG PO TABS
20.0000 mg | ORAL_TABLET | Freq: Every day | ORAL | Status: DC
  Administered 2017-11-27 – 2017-11-28 (×2): 20 mg via ORAL
  Filled 2017-11-27 (×2): qty 2

## 2017-11-27 MED ORDER — ASPIRIN EC 81 MG PO TBEC
81.0000 mg | DELAYED_RELEASE_TABLET | Freq: Every day | ORAL | Status: DC
  Administered 2017-11-28: 81 mg via ORAL
  Filled 2017-11-27: qty 1

## 2017-11-27 MED ORDER — ROSUVASTATIN CALCIUM 20 MG PO TABS: 20.0000 mg | ORAL_TABLET | Freq: Every day | ORAL | Status: DC

## 2017-11-27 MED ORDER — FENTANYL CITRATE (PF) 100 MCG/2ML IJ SOLN
INTRAMUSCULAR | Status: AC
  Filled 2017-11-27: qty 2

## 2017-11-27 MED ORDER — NITROGLYCERIN IN D5W 200-5 MCG/ML-% IV SOLN
INTRAVENOUS | Status: DC | PRN
  Administered 2017-11-27: 10 ug/min via INTRAVENOUS

## 2017-11-27 MED ORDER — SODIUM CHLORIDE 0.9% FLUSH
3.0000 mL | Freq: Two times a day (BID) | INTRAVENOUS | Status: DC
  Administered 2017-11-28: 3 mL via INTRAVENOUS

## 2017-11-27 MED ORDER — SODIUM CHLORIDE 0.9 % WEIGHT BASED INFUSION: 3.0000 mL/kg/h | INTRAVENOUS | Status: AC

## 2017-11-27 MED ORDER — ASPIRIN 81 MG PO CHEW: 324.0000 mg | CHEWABLE_TABLET | ORAL | Status: AC

## 2017-11-27 MED ORDER — EZETIMIBE 10 MG PO TABS: 10.0000 mg | ORAL_TABLET | Freq: Every day | ORAL | Status: DC

## 2017-11-27 MED ORDER — SODIUM CHLORIDE 0.9 % WEIGHT BASED INFUSION
1.0000 mL/kg/h | INTRAVENOUS | Status: DC
  Administered 2017-11-27: 1 mL/kg/h via INTRAVENOUS

## 2017-11-27 SURGICAL SUPPLY — 15 items
CATH OPTITORQUE TIG 4.0 5F (CATHETERS) ×2 IMPLANT
CATH VISTA GUIDE 6FR JL3.5 (CATHETERS) ×2 IMPLANT
CATH VISTA GUIDE 6FR XB4 (CATHETERS) ×2 IMPLANT
DEVICE RAD COMP TR BAND LRG (VASCULAR PRODUCTS) ×2 IMPLANT
GLIDESHEATH SLEND A-KIT 6F 20G (SHEATH) ×2 IMPLANT
GUIDEWIRE INQWIRE 1.5J.035X260 (WIRE) ×1 IMPLANT
INQWIRE 1.5J .035X260CM (WIRE) ×2
KIT ENCORE 26 ADVANTAGE (KITS) ×2 IMPLANT
KIT HEART LEFT (KITS) ×2 IMPLANT
KIT HEMO VALVE WATCHDOG (MISCELLANEOUS) ×2 IMPLANT
PACK CARDIAC CATHETERIZATION (CUSTOM PROCEDURE TRAY) ×2 IMPLANT
STENT RESOLUTE ONYX 4.5X18 (Permanent Stent) ×2 IMPLANT
TRANSDUCER W/STOPCOCK (MISCELLANEOUS) ×2 IMPLANT
TUBING CIL FLEX 10 FLL-RA (TUBING) ×2 IMPLANT
WIRE COUGAR XT STRL 190CM (WIRE) ×2 IMPLANT

## 2017-11-27 NOTE — Progress Notes (Signed)
Back from cath lab. Right radial site without bleeding. TR band on . Drinking fluids and tolerating. Family in at bedside updated on status.

## 2017-11-27 NOTE — Progress Notes (Signed)
No chest pain since before cath. Right cath site with no hematoma, covered with dressing, tegaderm. Swelling in fingers has decreased over the afternoon, keeping right hand/arm elevated. Ambulating to Bathroom, denies any c/o

## 2017-11-27 NOTE — Progress Notes (Signed)
ANTICOAGULATION CONSULT NOTE - Initial Consult  Pharmacy Consult for heparin Indication: chest pain/ACS  Allergies  Allergen Reactions  . Crestor [Rosuvastatin] Other (See Comments)    Severe leg cramps  . Percocet [Oxycodone-Acetaminophen] Itching, Swelling, Rash and Other (See Comments)    Swelling to face, hands, mouth  . Erythromycin Other (See Comments)    Hallucinations     Patient Measurements:   Heparin Dosing Weight:   Vital Signs:    Labs: No results for input(s): HGB, HCT, PLT, APTT, LABPROT, INR, HEPARINUNFRC, HEPRLOWMOCWT, CREATININE, CKTOTAL, CKMB, TROPONINI in the last 72 hours.  CrCl cannot be calculated (Patient's most recent lab result is older than the maximum 21 days allowed.).   Medical History: Past Medical History:  Diagnosis Date  . Arthritis    "a little in my right knee" (07/14/2016)  . CAD (coronary artery disease)   . Chronic congestive heart failure with left ventricular diastolic dysfunction ([place])   . Complication of anesthesia    Pt reports "they have a hard time waking me up"  . GERD (gastroesophageal reflux disease)    hx  . Headache    "with every heart issue that I have" (07/14/2016)  . High cholesterol   . History of hiatal hernia 1990s   "fixed it w/scope down my throat"  . Hypertension   . Myocardial infarction [facility]) 05/2013   stents: left PDA, 1st OM at [facility]  . Obesity   . Obstructive sleep apnea    previous test "inconclusive" per patient,  has appointment with [facility]rology 10/06/16 for evaluation  . Persistent atrial fibrillation ([place])   . Pneumonia ~ 1992  . Seasonal allergies    "I take Allegra prn" (07/14/2016)     Assessment: 52 yo male from outside hospital CBC ok, SCr 0.8 both from outside hospital Going for cath this morning On Eliquis PTA for hx of AFib, last dose was last night ~2300 Rec'd 5000 units bolus + came over with heparin running at 1300 units/hr  Goal of Therapy:  Heparin  level 0.3-0.7 units/ml Monitor platelets by anticoagulation protocol: Yes    Plan:  -Continue heparin at 1300 units/hr -Daily HL, CBC -Check level this morning vs f/u plans for cath   Harvel Quale 11/27/2017,6:41 AM

## 2017-11-27 NOTE — Plan of Care (Signed)
In this am from [place]. Stent placed to circ. Is already on brilinta and has knowledge of treatments from previous stent. No therapies needed for home at this time.

## 2017-11-27 NOTE — H&P (Signed)
Danny Kelley is an 52 y.o. male.   Chief Complaint: Chest pain HPI: He is a Caucasian male with history of known coronary artery disease and has history of left circumflex and obtuse marginal 1 and plasty in 2014, obstructive sleep apnea, prior tobacco use disorder, paroxysmal atrial fibrillation and has had atrial fibrillation ablation, hypertension, history of TIA, migraine headaches, hyperlipidemia, unable to tolerate statins, palpitations, who is admitted to the hospital after he presented with chest pain in the form of chest pressure in the center of the chest associated with palpitations, was found to have significant ST depression in the anteroseptal leads, was then transferred from [facility] to [facility] for further evaluation. He continues to have chest pain, no other associated symptoms. No nausea or vomiting. No other dyspnea. Symptoms started last evening.  He does have sleep apnea and is further being evaluated.  Past Medical History:  Diagnosis Date  . Arthritis    "a little in my right knee" (07/14/2016)  . CAD (coronary artery disease)   . Chronic congestive heart failure with left ventricular diastolic dysfunction ([place])   . Complication of anesthesia    Pt reports "they have a hard time waking me up"  . GERD (gastroesophageal reflux disease)    hx  . Headache    "with every heart issue that I have" (07/14/2016)  . High cholesterol   . History of hiatal hernia 1990s   "fixed it w/scope down my throat"  . Hypertension   . Myocardial infarction [facility]) 05/2013   stents: left PDA, 1st OM at [facility]  . Obesity   . Obstructive sleep apnea    previous test "inconclusive" per patient,  has appointment with [facility] Neurology 10/06/16 for evaluation  . Persistent atrial fibrillation ([place])   . Pneumonia ~ 1992  . Seasonal allergies    "I take Allegra prn" (07/14/2016)    Past Surgical History:  Procedure Laterality Date  . APPENDECTOMY  1984  .  CARDIOVERSION N/A 05/27/2016   Procedure: CARDIOVERSION;  Surgeon: [name], MD;  Location: [facility] ENDOSCOPY;  Service: Cardiovascular;  Laterality: N/A;  . CARDIOVERSION N/A 06/11/2016   Procedure: CARDIOVERSION;  Surgeon: [name], MD;  Location: [place];  Service: Cardiovascular;  Laterality: N/A;  . CORONARY ANGIOPLASTY WITH STENT PLACEMENT  05/30/2013   "2 stents put in at [facility]" & /stent card  . ELECTROPHYSIOLOGIC STUDY N/A 11/13/2016   Procedure: Atrial Fibrillation Ablation;  Surgeon: [name], MD;  Location: [place] CV LAB;  Service: Cardiovascular;  Laterality: N/A;  . ESOPHAGOGASTRODUODENOSCOPY (EGD) WITH ESOPHAGEAL DILATION  1990s X 2  . KNEE ARTHROSCOPY Right 1986; ~ 1995 X 2  . LAPAROSCOPIC CHOLECYSTECTOMY  2015  . REPAIR OF ESOPHAGUS  1998   perforation of the distal esophagus from bad heartburn  . TEE WITHOUT CARDIOVERSION N/A 11/13/2016   Procedure: TRANSESOPHAGEAL ECHOCARDIOGRAM (TEE);  Surgeon: Thayer Headings, MD;  Location: [facility] ENDOSCOPY;  Service: Cardiovascular;  Laterality: N/A;    Family History  Problem Relation Age of Onset  . Heart disease Mother 84       CABG age 34  . Breast cancer Mother   . Tongue cancer Mother   . Diabetes Mother   . Heart attack Father 42       Father had around 7 heart attacks per pt  . Diabetes Father   . Lung cancer Father   . Congestive Heart Failure Father   . Lung cancer Paternal Uncle  Social History:  reports that he quit smoking about a year ago. His smoking use included cigarettes and e-cigarettes. He has a 25.00 pack-year smoking history. he has never used smokeless tobacco. He reports that he does not drink alcohol or use drugs.  Allergies:  Allergies  Allergen Reactions  . Crestor [Rosuvastatin] Other (See Comments)    Severe leg cramps  . Percocet [Oxycodone-Acetaminophen] Itching, Swelling, Rash and Other (See Comments)    Swelling to face, hands, mouth  . Erythromycin Other (See Comments)     Hallucinations   . Oxycodone Itching and Rash    Completley intolerant to any meds with oxycodone in it     Medications Prior to Admission  Medication Sig Dispense Refill  . acetaminophen (TYLENOL) 500 MG tablet Take 1,000 mg by mouth every 6 (six) hours as needed for headache (pain).    Marland Kitchen amLODipine (NORVASC) 5 MG tablet Take 1 tablet (5 mg total) by mouth daily. 90 tablet 3  . apixaban (ELIQUIS) 5 MG TABS tablet Take 5 mg by mouth 2 (two) times daily.    Marland Kitchen aspirin EC 81 MG EC tablet Take 1 tablet (81 mg total) by mouth daily. 30 tablet 1  . b complex vitamins tablet Take 1 tablet by mouth daily.    . bisoprolol (ZEBETA) 10 MG tablet Take 2 tablets (20 mg total) by mouth daily. 60 tablet 1  . cholecalciferol (VITAMIN D) 1000 units tablet Take 1,000 Units by mouth daily.    Marland Kitchen ezetimibe (ZETIA) 10 MG tablet Take 10 mg by mouth daily.    . fexofenadine (ALLEGRA) 180 MG tablet Take 180 mg by mouth daily as needed (seasonal allergies).     . Flaxseed, Linseed, (FLAX SEED OIL PO) Take 1,200 mg by mouth daily.     . MULTAQ 400 MG tablet Take 400 mg by mouth 2 (two) times daily with a meal.   1  . Multiple Vitamin (MULTIVITAMIN WITH MINERALS) TABS tablet Take 1 tablet by mouth daily. Centrum    . nicotine (NICODERM CQ - DOSED IN MG/24 HOURS) 21 mg/24hr patch Place 1 patch (21 mg total) onto the skin daily. 28 patch 0  . Omega-3 Fatty Acids (FISH OIL OMEGA-3 PO) Take 540 mg by mouth daily.    . phenylephrine-shark liver oil-mineral oil-petrolatum (PREPARATION H) 0.25-3-14-71.9 % rectal ointment Place 1 application rectally 2 (two) times daily as needed for hemorrhoids.    . promethazine (PHENERGAN) 25 MG tablet Take 25 mg by mouth every 6 (six) hours as needed for nausea or vomiting (for 5 days).    . shark liver oil-cocoa butter (PREPARATION H) 0.25-3-85.5 % suppository Place 1 suppository rectally 2 (two) times daily as needed for hemorrhoids.    . Topiramate ER (TROKENDI XR) 50 MG CP24 Take by  mouth. Take 50mg  po for 7 days and then increase to 100mg  po daily    . torsemide (DEMADEX) 20 MG tablet Take 20 mg by mouth as directed. Take as needed for swelling    . valsartan-hydrochlorothiazide (DIOVAN-HCT) 320-25 MG tablet Take 1 tablet by mouth daily.      Results for orders placed or performed during the hospital encounter of 11/27/17 (from the past 48 hour(s))  Hemoglobin A1c     Status: Abnormal   Collection Time: 11/27/17  6:34 AM  Result Value Ref Range   Hgb A1c MFr Bld 6.0 (H) 4.8 - 5.6 %    Comment: (NOTE) Pre diabetes:  5.7%-6.4% Diabetes:              >6.4% Glycemic control for   <7.0% adults with diabetes    Mean Plasma Glucose 125.5 mg/dL  Protime-INR     Status: None   Collection Time: 11/27/17  6:34 AM  Result Value Ref Range   Prothrombin Time 14.0 11.4 - 15.2 seconds   INR 1.09     Review of Systems  Constitutional: Negative.   HENT: Negative.   Eyes: Negative.   Respiratory: Negative.   Cardiovascular: Positive for chest pain and palpitations. Negative for orthopnea, claudication and leg swelling.  Gastrointestinal: Negative.   Genitourinary: Negative.   Musculoskeletal: Positive for back pain.  Skin: Negative.   Neurological: Negative.   Endo/Heme/Allergies: Negative.   Psychiatric/Behavioral: Negative.   All other systems reviewed and are negative.   Blood pressure (!) 120/91, pulse 76, temperature 98.3 F (36.8 C), temperature source Oral, height 5\' 9"  (1.753 m), weight 103.6 kg (228 lb 6.3 oz), SpO2 99 %. Body mass index is 33.73 kg/m.  Physical Exam  Constitutional: He is oriented to person, place, and time. He appears well-developed and well-nourished. No distress.  Mildly obese  HENT:  Head: Normocephalic.  Eyes: Conjunctivae are normal.  Neck: Normal range of motion. Neck supple.  Cardiovascular: Normal rate, regular rhythm, normal heart sounds and intact distal pulses. Exam reveals no gallop and no friction rub.  No murmur  heard. Respiratory: Effort normal and breath sounds normal.  GI: Soft. Bowel sounds are normal.  Musculoskeletal: Normal range of motion.  Neurological: He is alert and oriented to person, place, and time.  Skin: Skin is warm and dry. He is not diaphoretic.  Psychiatric: He has a normal mood and affect.  Carotids: Faint bilateral carotid bruit present.   EKG 11/27/2017 at 6 AM normal sinus rhythm, normal axis, nonspecific ST abnormality in the septal leads, PVC. EKG at 02:27 hours on 11/27/2017: Normal sinus rhythm/sinus tachycardia at the rate of 108 bpm, new T-wave inversion in inferior leads and horizontal ST segment depression in V3 to V5 suggestive of lateral subendocardial ischemia versus non-STEMI.  Assessment/Plan 1. Unstable angina pectoris with dynamic EKG abnormality with ischemic-looking EKG, serum troponin negative, labs are within normal limits, still has ongoing chest pain. 2. Coronary artery disease of the native heart, with unstable angina pectoris 3. PTCA status,  05/30/2013: 3.5 x 18 mm Xience DES to dominant distal left circumflex, 2.5 x 12 mm Xience to OM1. 4. Hypertension 5. Hyperlipidemia 6. History of TIA presently not having any active symptoms 7. Paroxysmal atrial fibrillation with history of atrial fibrillation ablation on 11/13/2016 presently maintains sinus rhythm on Multaq.  Recommendation: Due to ongoing chest pain, EKG has normalized but needs coronary angiography. Needs aggressive risk modification, he has not been on statins due to myalgias we need to address this. Blood pressure is also elevated.  Discussed risks, benefits and alternatives of angiogram including but not limited to <1% risk of death, stroke, MI, need for urgent surgical revascularization, renal failure, but not limited to thest. patient is willing to proceed.  [name], MD 11/27/2017, 7:52 AM

## 2017-11-27 NOTE — Progress Notes (Signed)
  Echocardiogram 2D Echocardiogram has been performed.  Jannett Celestine 11/27/2017, 11:26 AM

## 2017-11-27 NOTE — Interval H&P Note (Signed)
History and Physical Interval Note:  11/27/2017 8:04 AM  Danny Kelley  has presented today for surgery, with the diagnosis of Nstemi  The various methods of treatment have been discussed with the patient and family. After consideration of risks, benefits and other options for treatment, the patient has consented to  Procedure(s): LEFT HEART CATH AND CORONARY ANGIOGRAPHY (N/A) and possible PCI as a surgical intervention .  The patient's history has been reviewed, patient examined, no change in status, stable for surgery.  I have reviewed the patient's chart and labs.  Questions were answered to the patient's satisfaction.   Cath Lab Visit (complete for each Cath Lab visit)  Clinical Evaluation Leading to the Procedure:   ACS: Yes.    Non-ACS:    Anginal Classification: CCS IV  Anti-ischemic medical therapy: Maximal Therapy (2 or more classes of medications)  Non-Invasive Test Results: No non-invasive testing performed  Prior CABG: No previous CABG        [name]

## 2017-11-27 NOTE — Progress Notes (Signed)
Transported to cath lab via bed with telmetry. Patient c/o squeezing chest discomfort. Consent signed. NTG at 10 mcg. Heparin turned off per Dr Einar Gip or cath lab.

## 2017-11-28 LAB — BASIC METABOLIC PANEL
Anion gap: 9 (ref 5–15)
BUN: 10 mg/dL (ref 6–20)
CHLORIDE: 103 mmol/L (ref 101–111)
CO2: 24 mmol/L (ref 22–32)
Calcium: 8.2 mg/dL — ABNORMAL LOW (ref 8.9–10.3)
Creatinine, Ser: 0.85 mg/dL (ref 0.61–1.24)
GFR calc Af Amer: >60 mL/min (ref >60)
GLUCOSE: 96 mg/dL (ref 65–99)
POTASSIUM: 3.3 mmol/L — AB (ref 3.5–5.1)
Sodium: 136 mmol/L (ref 135–145)

## 2017-11-28 LAB — CBC
HEMATOCRIT: 39.7 % (ref 39.0–52.0)
Hemoglobin: 13.6 g/dL (ref 13.0–17.0)
MCH: 28.3 pg (ref 26.0–34.0)
MCHC: 34.3 g/dL (ref 30.0–36.0)
MCV: 82.7 fL (ref 78.0–100.0)
PLATELETS: 242 10*3/uL (ref 150–400)
RBC: 4.8 MIL/uL (ref 4.22–5.81)
RDW: 13.2 % (ref 11.5–15.5)
WBC: 9.8 10*3/uL (ref 4.0–10.5)

## 2017-11-28 MED ORDER — NITROGLYCERIN 0.4 MG SL SUBL: 0.4000 mg | SUBLINGUAL_TABLET | SUBLINGUAL | 2 refills | Status: DC | PRN

## 2017-11-28 MED ORDER — ASPIRIN 81 MG PO TBEC: 81.0000 mg | DELAYED_RELEASE_TABLET | Freq: Every day | ORAL | 0 refills | Status: DC

## 2017-11-28 MED ORDER — CLOPIDOGREL BISULFATE 75 MG PO TABS: 75.0000 mg | ORAL_TABLET | Freq: Every day | ORAL | 3 refills | Status: DC

## 2017-11-28 MED ORDER — PRAVASTATIN SODIUM 40 MG PO TABS: 40.0000 mg | ORAL_TABLET | Freq: Every evening | ORAL | 2 refills | Status: DC

## 2017-11-28 MED ORDER — BISOPROLOL FUMARATE 10 MG PO TABS: 20.0000 mg | ORAL_TABLET | Freq: Every day | ORAL | 1 refills | Status: DC

## 2017-11-28 NOTE — Discharge Summary (Signed)
Physician Discharge Summary  Patient ID: Danny Kelley MRN: [id number] DOB/AGE: [birth year] 52 y.o.  Admit date: 11/27/2017 Discharge date: 11/28/2017  Primary Discharge Diagnosis:  1. Unstable angina 2. Coronary artery disease of the native heart, with unstable angina pectoris 3. PTCA status, 11/27/2017 Mid Cx 4.5 x 18 mm resolute onyx deployed at 16 atmospheric pressure(4.75 mm), reduced to 0%. Previously placed stents, 05/30/2013: 3.5 x 18 mm Xience DES to dominant distal left circumflex, 2.5 x 12 mm Xience to OM1 widely patent.  4. Hypertension controlled 5. Hyperlipidemia 6. History of TIA presently not having any active symptoms 7. Paroxysmal atrial fibrillation with history of atrial fibrillation ablation on 11/13/2016 presently maintains sinus rhythm on Multaq CHA2DS2-VASCScore: Risk Score  2,  Yearly risk of stroke  2.3. Recommendation: ASA No/Anticoagulation Yes  8. Hyperglycemia, needs weight loss.  Significant Diagnostic Studies: Echocardiogram 11/27/2017: Normal LVEF. Mild LVH. Normal diastolic parameters. Mild LA enlargement.   Coronary angiogram 11/27/2017: Normal LV systolic function without wall motion abnormality. Left main normal. Superdominant left circumflex coronary artery. Ulcerated midsegment 80-90% stenosis after the OM1. Mild diffuse disease. S/P 4.5 x 18 mm resolute onyx deployed at 16 atmospheric pressure(4.75 mm), reduced to 0%. Previously placed stents, 05/30/2013: 3.5 x 18 mm Xience DES to dominant distal left circumflex, 2.5 x 12 mm Xience to OM1 widely patent. LAD diffusely diseased proximal to mid segment constituting 50% stenosis and mild disease distally. Small nondominant RCA with diffuse proximal 50-60% stenosis.  EKG 11/27/2017 at 6 AM normal sinus rhythm, normal axis, nonspecific ST abnormality in the septal leads, PVC. EKG at 02:27 hours on 11/27/2017: Normal sinus rhythm/sinus tachycardia at the rate of 108 bpm, new T-wave inversion in inferior leads and  horizontal ST segment depression in V3 to V5 suggestive of lateral subendocardial ischemia versus non-STEMI. EKG 11/28/2017: NSR, Non specific ST abnormality.   Hospital Course: Admitted to the hospital with ongoing chest pain and transferred from [place] and due to ongoing chest pain, taken urgently to cath lab and underwent successful PCI to superdominant Cx.  Stable following morning for discharge.   Recommendations on discharge:  Patient will be discharged home ashe remains stable. Patient is on Eliquis for paroxysmal atrial fibrillation, I will start him on Plavix 1 year with aspirin 30 days to reduce the risk of bleed. Continue Eliquis. I will also refer him for outpatient cardiac rehabilitation. Will address his lipid status, added pravastatin and unable to tolerate will consider him for PCSK9 inhibitors.  Discharge Exam: Blood pressure (!) 138/91, pulse 66, temperature 98.3 F (36.8 C), temperature source Oral, resp. rate 17, height 5\' 9"  (1.753 m), weight 103.6 kg (228 lb 6.3 oz), SpO2 98 %.   GEN- The patient is well appearing, alert and oriented x 3 today.   Head- normocephalic, atraumatic Eyes-  Sclera clear, conjunctiva pink Ears- hearing intact Oropharynx- clear Neck- supple, no JVP Lymph- no cervical lymphadenopathy Lungs- Clear to ausculation bilaterally, normal work of breathing Heart- Regular rate and rhythm, no murmurs, rubs or gallops, PMI not laterally displaced GI- soft, NT, ND, + BS Extremities- no clubbing, cyanosis, or edema MS- no significant deformity or atrophy Skin- no rash or lesion Psych- euthymic mood, full affect Neuro- strength and sensation are intact Right radial access without complications. Labs:   Lab Results  Component Value Date   WBC 9.8 11/28/2017   HGB 13.6 11/28/2017   HCT 39.7 11/28/2017   MCV 82.7 11/28/2017   PLT 242 11/28/2017    Recent Labs  Lab 11/28/17 0338  NA 136  K 3.3*  CL 103  CO2 24  BUN 10  CREATININE  0.85  CALCIUM 8.2*  GLUCOSE 96    Lipid Panel     Component Value Date/Time   CHOL 198 11/27/2017 0634   TRIG 78 11/27/2017 0634   HDL 40 (L) 11/27/2017 0634   CHOLHDL 5.0 11/27/2017 0634   VLDL 16 11/27/2017 0634   LDLCALC 142 (H) 11/27/2017 0634    HEMOGLOBIN A1C Lab Results  Component Value Date   HGBA1C 6.0 (H) 11/27/2017   MPG 125.5 11/27/2017    Cardiac Panel (last 3 results) Recent Labs    11/27/17 0634 11/27/17 1153 11/27/17 2019  TROPONINI <0.03 <0.03 0.04*    Lab Results  Component Value Date   TROPONINI 0.04 ([place]) 11/27/2017    FOLLOW UP PLANS AND APPOINTMENTS   Allergies as of 11/28/2017      Reactions   Crestor [rosuvastatin] Other (See Comments)   Severe leg cramps   Percocet [oxycodone-acetaminophen] Itching, Swelling, Rash, Other (See Comments)   Swelling to face, hands, mouth   Lipitor [atorvastatin Calcium] Other (See Comments)   myalgias   Erythromycin Other (See Comments)   Hallucinations   Oxycodone Itching, Rash   Completley intolerant to any meds with oxycodone in it       Medication List    STOP taking these medications   propranolol 20 MG tablet Commonly known as:  INDERAL     TAKE these medications   amLODipine 5 MG tablet Commonly known as:  NORVASC Take 1 tablet (5 mg total) by mouth daily. What changed:  how much to take   aspirin 81 MG EC tablet Take 1 tablet (81 mg total) by mouth daily. Take for a total of one month only then stop What changed:  additional instructions   b complex vitamins tablet Take 1 tablet by mouth daily.   bisoprolol 10 MG tablet Commonly known as:  ZEBETA Take 2 tablets (20 mg total) by mouth daily.   cholecalciferol 1000 units tablet Commonly known as:  VITAMIN D Take 1,000 Units by mouth daily.   clopidogrel 75 MG tablet Commonly known as:  PLAVIX Take 1 tablet (75 mg total) by mouth daily with breakfast. Start taking on:  11/29/2017   ELIQUIS 5 MG Tabs tablet Generic drug:   apixaban Take 5 mg by mouth 2 (two) times daily.   ezetimibe 10 MG tablet Commonly known as:  ZETIA Take 10 mg by mouth daily.   fexofenadine 180 MG tablet Commonly known as:  ALLEGRA Take 180 mg by mouth daily as needed (seasonal allergies).   FISH OIL OMEGA-3 PO Take 540 mg by mouth daily.   FLAX SEED OIL PO Take 1,200 mg by mouth daily.   MULTAQ 400 MG tablet Generic drug:  dronedarone Take 400 mg by mouth 2 (two) times daily with a meal.   multivitamin with minerals Tabs tablet Take 1 tablet by mouth daily. Centrum   nitroGLYCERIN 0.4 MG SL tablet Commonly known as:  NITROSTAT Place 1 tablet (0.4 mg total) under the tongue every 5 (five) minutes x 3 doses as needed for chest pain.   pravastatin 40 MG tablet Commonly known as:  PRAVACHOL Take 1 tablet (40 mg total) by mouth every evening.   promethazine 25 MG tablet Commonly known as:  PHENERGAN Take 25 mg by mouth every 6 (six) hours as needed for nausea or vomiting (for 5 days).   shark liver oil-cocoa butter  0.25-3-85.5 % suppository Commonly known as:  PREPARATION H Place 1 suppository rectally 2 (two) times daily as needed for hemorrhoids.   valsartan-hydrochlorothiazide 320-25 MG tablet Commonly known as:  DIOVAN-HCT Take 1 tablet by mouth daily.        Follow-up Information    [name], MD Follow up.   Specialty:  Cardiology Why:  I want to see you in 10 days to 2 weeks please call. We will also call you Contact information: [address] [place] [address] [phone]

## 2017-11-28 NOTE — Progress Notes (Signed)
CARDIAC REHAB PHASE I   PRE:  Rate/Rhythm: 66 sinus rhythm  BP:  Supine:   Sitting: 138/91  Standing:    SaO2: 98% RA   MODE:  Ambulation: 370  ft   POST:  Rate/Rhythem: 78 sinus rhythm  BP:  Supine:   Sitting: 123/87  Standing:    SaO2: 98% RA Pt returned to chair, call light in reach.    Education completed including risk factor modification, low fat-low cholesterol diet, exercise, and medication compliance.  Pt oriented to outpatient cardiac rehab.  At pt request, referral will be sent to [facility].  Understanding verbalized  [id number]   Nancie Neas Rion  11/28/17  11:01 AM

## 2017-11-28 NOTE — Care Management Note (Signed)
Case Management Note  Patient Details  Name: GERGORY BIELLO MRN: [id number] Date of Birth: 04-20-1966  Subjective/Objective:  From home, s/p coronary stent intervention, on plavix.                   Action/Plan: DC home no needs.  Expected Discharge Date:  11/28/17               Expected Discharge Plan:  Home/Self Care  In-House Referral:     Discharge planning Services  CM Consult  Post Acute Care Choice:    Choice offered to:     DME Arranged:    DME Agency:     HH Arranged:    HH Agency:     Status of Service:  Completed, signed off  If discussed at [employer] of Stay Meetings, dates discussed:    Additional Comments:  Zenon Mayo, RN 11/28/2017, 11:24 AM

## 2017-11-28 NOTE — Discharge Instructions (Addendum)

## 2017-11-29 ENCOUNTER — Encounter (HOSPITAL_COMMUNITY): Payer: Self-pay | Admitting: Cardiology

## 2017-11-30 MED FILL — Lidocaine HCl Local Inj 1%: INTRAMUSCULAR | Qty: 20 | Status: AC

## 2018-08-24 DIAGNOSIS — E669 Obesity, unspecified: Secondary | ICD-10-CM

## 2018-08-24 DIAGNOSIS — I4891 Unspecified atrial fibrillation: Secondary | ICD-10-CM | POA: Diagnosis not present

## 2018-08-24 DIAGNOSIS — I10 Essential (primary) hypertension: Secondary | ICD-10-CM

## 2018-08-24 DIAGNOSIS — G43909 Migraine, unspecified, not intractable, without status migrainosus: Secondary | ICD-10-CM

## 2018-08-24 DIAGNOSIS — Z720 Tobacco use: Secondary | ICD-10-CM

## 2018-08-24 DIAGNOSIS — R079 Chest pain, unspecified: Secondary | ICD-10-CM

## 2018-08-24 DIAGNOSIS — I2510 Atherosclerotic heart disease of native coronary artery without angina pectoris: Secondary | ICD-10-CM

## 2018-09-20 ENCOUNTER — Encounter (HOSPITAL_COMMUNITY): Payer: Self-pay | Admitting: Cardiology

## 2018-09-20 DIAGNOSIS — I2510 Atherosclerotic heart disease of native coronary artery without angina pectoris: Secondary | ICD-10-CM | POA: Diagnosis present

## 2018-09-20 NOTE — H&P (Signed)
OFFICE VISIT NOTES COPIED TO EPIC FOR DOCUMENTATION  . History of Present Illness Danny Page MD; 09/06/2018 9:41 PM) Patient words: 7-10 day s/p hosp & monitor results; Last OV 07/28/2018 Pt had a visit in er @ [facility] for cp and he c/o it not being afib he knows what that feels like; the pain he has now is different and uncomfortable; he has nitro didnt take it due to it making his migraines worse; CP happends at night.  The patient is a 52 year old male who presents for a Follow-up for Coronary artery disease.  Additional reasons for visit:  Follow-up for Atrial fibrillation is described as the following: Danny Kelley is a Caucasian male with CAD, unstable angina pectoris on 11/26/2017, angioplasty to superdominant left circumflex coronary artery with 4.5 mm resolute DES in the midsegment, patent stent placed in Distal Cx and OM 1 in 2014. He has hypertension, hyperlipidemia, OSA on CPAP and compliant, tobacco use, quit in March 2017, h/o TIA in Feb 2018. Paroxysmal Atrial fib S/P ablation 11/13/2016 by Dr. Rayann Heman. Has maintained sinus on Multaq. He does not tolerate Tikosyn or sotalol due to prolongation in QT.  Has started to have recurrence of A. Fib 07/23/2018 (patient has symptomatic A. Fib with fatigue and palpitations). Was seen on 08/24/2018 at [facility] for chest pain and had recurrence atrial fibrillation with controlled went response and was discharged home after ruling out for MI, EKG did not reveal any evidence of ischemia.  Patient is here for post ED visit f/u and c/o severe chest pain even with minimal activity and states he is unable to do anything including ADL without marked fatigue and chest pain and dyspnea.  He has had multiple episodes of syncope, has been evaluated by neurology and suspicion that it could be cardiogenic syncope. States that when he is walking, suddenly falls to the ground with no explanation. Fortunately has had no injury. No other  associated symptoms, no palpitations, no active seizures or incontinence. He has now been diagnosed as having seizures and was started on [facility] 07/27/18.   Problem List/Past Medical Danny Kelley; 09/06/2018 1:43 PM) History of TIA (transient ischemic attack) (Z86.73) [12/16/2016]: Arthritis of knee (M17.10)  Atherosclerosis of native coronary artery of native heart without angina pectoris (I25.10)  Coronary angiogram 11/27/2017: Normal LV systolic function without wall motion abnormality. Left main normal. Superdominant left circumflex coronary artery. Ulcerated midsegment 80-90% stenosis after the OM1. Mild diffuse disease. S/P 4.5 x 18 mm resolute onyx deployed at 16 atmospheric pressure(4.75 mm), reduced to 0%. Previously placed stents, 05/30/2013: 3.5 x 18 mm Xience DES to dominant distal left circumflex, 2.5 x 12 mm Xience to OM1 widely patent. LAD diffusely diseased proximal to mid segment constituting 50% stenosis and mild disease distally. Small nondominant RCA with diffuse proximal 50-60% stenosis. Benign essential hypertension (I10)  Hypercholesteremia (E78.00)  Other insomnia (G47.09)  Laboratory examination (Z01.89)  12/31/2017: Cholesterol 214, triglycerides 137, HDL 59, LDL 128. Glucose 114, potassium 3.8, creatinine 0.86, EGFR 102/118, alkaline phosphate minimally elevated at 126, CMP otherwise normal. Labs 10/2017: Potassium 4.1, BUN 13, creatinine 0.83, glucose 12, CMP normal. Total cholesterol 284, triglycerides 163, HDL 52, LDL 200, non-HDL cholesterol 232. Magnesium 2.2. Testosterone 365, TSH normal, B12 normal, HB 16.2/HCT 47.9, platelets 233. A1c 5.9%. Headache, vasomotor (G44.009)  GERD without esophagitis (K21.9)  Nausea in adult (R11.0)  Benign positional vertigo, bilateral (H81.13)  Syncope and collapse (R55)  Event Monitor 30 days 07/28/2018: Paroxysmal episodes of atrial fibrillation  with controlled ventricular response was noted along with PVCs. Some of  this symptoms of chest pain, palpitations and dizziness correlated with atrial fibrillation, others showed normal sinus rhythm. No heart block, no other significant arrhythmias. S/P PTCA (percutaneous transluminal coronary angioplasty) (Z98.61) [05/30/2013]: Paroxysmal atrial fibrillation (I48.0) [05/23/2016]: Direct current cardioversion ( Ist 05/27/16) 06/11/2016: 150x2, 200 x1 to NSR on Sotalol. CHA2DS2-VASc Score is 2 with yearly risk of stroke of 2.2%. (CHF; HTN; Age <65, 91-74 and >75; DM; stroke; vasc disease; gender) Successful Cardiac ablation by Dr. [name] on 11/13/2016  Allergies Danny Page, MD; September 24, 2018 2:36 PM) Erythromycins  Percocet *ANALGESICS - OPIOID*  Itching. Crestor *ANTIHYPERLIPIDEMICS*  Myalgia Dilaudid *ANALGESICS - OPIOID*  Severe itching and rash  Family History Danny Kelley; Sep 24, 2018 1:43 PM) Mother  Deceased. around age 100, cancer, heart issues Father  Deceased. at age 2, chf Sister 1  older, 2 MI's and TIA, diabetes; pt doesnt known age of when these happened  Social History Danny Kelley; 09/24/18 1:43 PM) Current tobacco use  Current every day smoker. 4 Cigarettes daily; (quit 01/07/2015), smoked for 20 years Non Drinker/No Alcohol Use  Marital status  Married. Living Situation  Lives with spouse. Number of Children  2.  Past Surgical History Danny Kelley; Sep 24, 2018 1:43 PM) Appendectomy  Ablation, cardiac (01601) [11/13/2016]: Atrial fibrillation. No inducible arrhythmias following ablation both on and off of Isuprel knee surgery  esophageal reconstruction  Cholecystectomy [04/2014]:  Medication History Danny Page, MD; Sep 24, 2018 9:45 PM) Zofran (4MG Tablet, 1 (one) Tablet Tablet Oral two times daily, as needed, Taken starting 07/19/2018) Active. Fexofenadine HCl (180MG Tablet, 1 Oral as needed) Active. Dulcolax Stool Softener (56m Oral two times daily) Specific strength unknown - Active. Calcium  Carbonate (Antacid) (500MG Tablet Chewable, 1 Oral daily) Active. Cholecalciferol (2000UNIT Capsule, 1 Oral daily) Active. (Vitamin D3) Multivital (1 Oral daily) Active. B Complex + C TR (1 Oral daily) Active. Flax Seed Oil (1000MG Capsule, 24040mOral daily) Active. EpiPen 2-Pak (0.3MG/0.3ML Soln Auto-inj, Injection) Active. Triamcinolone Acetonide (0.1% Cream, External prn rash) Active. levETIRAcetam (250MG Tablet, 1 Oral daily) Active. Eliquis (5MG Tablet, 1 Tablet Tablet Tablet Tablet Oral two times daily, Taken starting 01/17/2018) Active. amLODIPine Besylate (10MG Tablet, 1 Tablet Tablet Tablet Oral daily, Taken starting 03/04/2018) Active. Docusate Sodium (100MG Capsule, Oral daily) Active. Multaq (400MG Tablet, 1 (one) Tablet Tablet Tablet T Oral two times daily, Taken starting 01/11/2018) Active. Zetia (10MG Tablet, 1 Tablet Tablet Tablet Tablet Oral daily, Taken starting 01/17/2018) Active. Bystolic (2009NAablet, 1 (one) Tablet Tablet Tablet Oral daily, Taken starting 03/23/2018) Active. (Approved until 03/21/2021) Nitroglycerin (0.4MG Tab Sublingual, 1 (one) Tablet Tablet Tablet T Sublingual every 5 minutes as needed for chest pain., Taken starting 11/10/2017) Active. Olmesartan Medoxomil-HCTZ (20-12.5MG Tablet, 1 (one) Tablet Tablet Table Oral daily, Taken starting 01/17/2018) Active. Pravastatin Sodium (40MG Tablet, 1 Tablet Tablet Tablet Tablet Oral every evening after dinner, Taken starting 01/06/2018) Active. (Discontinue Propranolol) Torsemide (20MG Tablet, 1 Tablet Tablet Oral daily as needed for leg edema or dyspnea, Taken starting 07/19/2018) Active. Clopidogrel Bisulfate (75MG Tablet, 1 (one) Tablet Tablet Tablet T Oral daily, Taken starting 07/28/2018) Active. Medications Reconciled (list present)  Diagnostic Studies History (SCheri [id number]:44 PM) Event Monitor  30 days 07/28/2018: Paroxysmal episodes of atrial fibrillation with controlled  ventricular response was noted along with PVCs. Some of this symptoms of chest pain, palpitations and dizziness correlated with atrial fibrillation, others showed normal sinus rhythm. No heart block, no other significant arrhythmias. Coronary  Angiogram [11/27/2017]: Coronary angiogram 11/27/2017: Normal LV systolic function without wall motion abnormality. Left main normal. Superdominant left circumflex coronary artery. Ulcerated midsegment 80-90% stenosis after the OM1. Mild diffuse disease. S/P 4.5 x 18 mm resolute onyx deployed at 16 atmospheric pressure(4.75 mm), reduced to 0%. Previously placed stents, 05/30/2013: 3.5 x 18 mm Xience DES to dominant distal left circumflex, 2.5 x 12 mm Xience to OM1 widely patent. LAD diffusely diseased proximal to mid segment constituting 50% stenosis and mild disease distally. Small nondominant RCA with diffuse proximal 50-60% stenosis. MRI Brain, Brain Stem [12/16/2016]: MRI: No evidence of old or acute insult. Normal intracranial MR angiography is a large and medium-sized vessels. The distal left vertebral artery is diminutive, which I favor this being congenital rather than acquired Cardioversion [06/11/2016]: 150x2, 200 x1 to NSR on Sotalol. Echocardiogram  11/27/2017: Normal LVEF. Mild LVH. Normal diastolic parameters. Mild LA enlargement.    Review of Systems Danny Page, MD; 09/06/2018 9:37 PM) General Present- Fatigue. Not Present- Anorexia and Fever. Respiratory Present- Decreased Exercise Tolerance. Cardiovascular Not Present- Chest Pain, Claudications, Edema, Orthopnea, Palpitations and Paroxysmal Nocturnal Dyspnea. Gastrointestinal Present- Nausea. Not Present- Black, Tarry Stool and Change in Bowel Habits. Neurological Present- Dizziness, Fainting and Headaches. Not Present- Focal Neurological Symptoms and Syncope. Endocrine Not Present- Cold Intolerance, Excessive Sweating, Heat Intolerance and Thyroid Problems. Hematology Not Present-  Anemia, Easy Bruising, Petechiae and Prolonged Bleeding. All other systems negative  Vitals Danny Kelley; 09/06/2018 2:17 PM) 09/06/2018 1:58 PM Weight: 236.31 lb Height: 70in Body Surface Area: 2.24 m Body Mass Index: 33.91 kg/m  Pulse: 61 (Regular)  P.OX: 97% (Room air) BP: 153/109 (Sitting, Left Arm, Standard)       Physical Exam Danny Page, MD; 09/06/2018 9:37 PM) General Mental Status-Alert. General Appearance-Cooperative and Appears stated age. Build & Nutrition-Well built and Moderately obese.  Head and Neck Thyroid Gland Characteristics - normal size and consistency and no palpable nodules.  Chest and Lung Exam Chest and lung exam reveals -quiet, even and easy respiratory effort with no use of accessory muscles, non-tender and on auscultation, normal breath sounds, no adventitious sounds.  Cardiovascular Cardiovascular examination reveals -normal heart sounds, regular rate and rhythm with no murmurs, carotid auscultation reveals no bruits, abdominal aorta auscultation reveals no bruits and no prominent pulsation, femoral artery auscultation bilaterally reveals normal pulses, no bruits, no thrills, normal pedal pulses bilaterally and no digital clubbing, cyanosis, edema, increased warmth or tenderness.  Abdomen Palpation/Percussion Normal exam - Non Tender and No hepatosplenomegaly.  Neurologic Neurologic evaluation reveals -alert and oriented x 3 with no impairment of recent or remote memory. Motor-Grossly intact without any focal deficits.  Musculoskeletal Global Assessment Left Lower Extremity - no deformities, masses or tenderness, no known fractures. Right Lower Extremity - no deformities, masses or tenderness, no known fractures.    Assessment & Plan Danny Page MD; 09/06/2018 9:45 PM) Coronary artery disease of native artery of native heart with stable angina pectoris (I25.118) Story: Coronary angiogram 11/27/2017:  Normal LV systolic function without wall motion abnormality. Left main normal. Superdominant left circumflex coronary artery. Ulcerated midsegment 80-90% stenosis after the OM1. Mild diffuse disease. S/P 4.5 x 18 mm resolute onyx deployed at 16 atmospheric pressure(4.75 mm), reduced to 0%. Previously placed stents, 05/30/2013: 3.5 x 18 mm Xience DES to dominant distal left circumflex, 2.5 x 12 mm Xience to OM1 widely patent. LAD diffusely diseased proximal to mid segment constituting 50% stenosis and mild disease distally. Small nondominant RCA with diffuse  proximal 50-60% stenosis. Impression: Echocardiogram 11/27/2017: Normal LVEF. Mild LVH. Normal diastolic parameters. Mild LA enlargement. Current Plans Started Isosorbide Mononitrate ER 60MG, 1 (one) Tablet to two tablets daily as directed, #60, 30 days starting 09/06/2018, Ref. x2. Paroxysmal atrial fibrillation (I48.0) Story: Direct current cardioversion ( Ist 05/27/16) 06/11/2016: 150x2, 200 x1 to NSR on Sotalol.  CHA2DS2-VASc Score is 2 with yearly risk of stroke of 2.2%. (CHF; HTN; Age <55, 83-74 and >42; DM; stroke; vasc disease; gender)  Successful Cardiac ablation by Dr. [name] on 11/13/2016 Impression: EKG 09/06/2018: Normal sinus rhythm at rate of 60 bpm, normal axis. No evidence of ischemia, normal QT interval.  EKG 07/28/2018: Atrial fibrillation with controlled ventricular response at the rate of 71 bpm, normal axis, no evidence of ischemia. Normal QT interval. Compared to EKG 02/17/2018: Normal sinus rhythm at rate of 68 bpm, Nonspecific anterolateral ST depression of 1 mm no longer present. Current Plans Complete electrocardiogram (09233) METABOLIC PANEL, BASIC (00762) CBC & PLATELETS (AUTO) (26333) Benign essential hypertension (I10) Laboratory examination (L45.62) Story: 12/31/2017: Cholesterol 214, triglycerides 137, HDL 59, LDL 128. Glucose 114, potassium 3.8, creatinine 0.86, EGFR 102/118, alkaline phosphate minimally  elevated at 126, CMP otherwise normal.  Labs 10/2017: Potassium 4.1, BUN 13, creatinine 0.83, glucose 12, CMP normal. Total cholesterol 284, triglycerides 163, HDL 52, LDL 200, non-HDL cholesterol 232. Magnesium 2.2. Testosterone 365, TSH normal, B12 normal, HB 16.2/HCT 47.9, platelets 233. A1c 5.9%.  Note:. Recommendation:  Ina Poupard is a Caucasian male with CAD, unstable angina pectoris on 11/26/2017, angioplasty to superdominant left circumflex coronary artery with 4.5 mm resolute DES in the midsegment, patent stent placed in Distal Cx and OM 1 in 2014. He has hypertension, hyperlipidemia, OSA diagnosed at age 57 and then had repeat sleep study in 2018 (told to be negative), tobacco use, quit in March 2017, h/o TIA in Feb 2018. Paroxysmal Atrial fib S/P ablation 11/13/2016 by Dr. Rayann Heman. Family history significant for premature CAD in both parents, mother had CABG at age 39 and father had MI at age 53.   Has started to have recurrence of A. Fib 07/23/2018 (patient has symptomatic A. Fib with fatigue and palpitations). He had maintained sinus on Multaq. He does not tolerate Tikosyn or sotalol due to prolongation in QT. Was seen on 08/24/2018 at [facility] for chest pain and had recurrence atrial fibrillation with controlled went response and was discharged home after ruling out for MI, EKG did not reveal any evidence of ischemia.  Patient had an appointment to see is due to multiple episodes of syncope, has been evaluated by neurology and suspicion that it could be cardiogenic syncope. 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 We could consider Loop recorder implant to evaluate his symptoms.  Extremely complex patient with recurrent exertional chest pain, also having pain during routine activities, I have added isosorbide mononitrate 60 mg, b.i.d. 1st, he will start with one pill once a day due to severe headaches e gets with S/L NTG.  I'll also set him up for coronary angiography. I'll also perform renal angiography due to uncontrolled hypertension and resistant hypertension. I believe some of the symptoms and signs may be related to his poor diet. I'll see him back after angiography and make further recommendations.  CC: Heide Scales, NP, CC: Sarina Ill, MD (Neuro)    Signed by Danny Page, MD (09/06/2018 9:46 PM)

## 2018-09-21 ENCOUNTER — Other Ambulatory Visit: Payer: Self-pay

## 2018-09-21 ENCOUNTER — Encounter (HOSPITAL_COMMUNITY)
Admission: RE | Disposition: A | Discharge status: Home / Self Care | Payer: Self-pay | Source: Ambulatory Visit | Attending: Cardiology

## 2018-09-21 ENCOUNTER — Ambulatory Visit (HOSPITAL_COMMUNITY)
Admission: RE | Admit: 2018-09-21 | Discharge: 2018-09-21 | Disposition: A | Discharge status: Home / Self Care | Payer: BLUE CROSS/BLUE SHIELD | Source: Ambulatory Visit | Attending: Cardiology | Admitting: Cardiology

## 2018-09-21 DIAGNOSIS — I2510 Atherosclerotic heart disease of native coronary artery without angina pectoris: Secondary | ICD-10-CM | POA: Diagnosis present

## 2018-09-21 DIAGNOSIS — G4733 Obstructive sleep apnea (adult) (pediatric): Secondary | ICD-10-CM | POA: Insufficient documentation

## 2018-09-21 DIAGNOSIS — Z87891 Personal history of nicotine dependence: Secondary | ICD-10-CM | POA: Diagnosis not present

## 2018-09-21 DIAGNOSIS — T82897A Other specified complication of cardiac prosthetic devices, implants and grafts, initial encounter: Secondary | ICD-10-CM | POA: Insufficient documentation

## 2018-09-21 DIAGNOSIS — E7800 Pure hypercholesterolemia, unspecified: Secondary | ICD-10-CM | POA: Insufficient documentation

## 2018-09-21 DIAGNOSIS — K219 Gastro-esophageal reflux disease without esophagitis: Secondary | ICD-10-CM | POA: Insufficient documentation

## 2018-09-21 DIAGNOSIS — I25118 Atherosclerotic heart disease of native coronary artery with other forms of angina pectoris: Secondary | ICD-10-CM | POA: Diagnosis not present

## 2018-09-21 DIAGNOSIS — Z7901 Long term (current) use of anticoagulants: Secondary | ICD-10-CM | POA: Diagnosis not present

## 2018-09-21 DIAGNOSIS — I110 Hypertensive heart disease with heart failure: Secondary | ICD-10-CM | POA: Insufficient documentation

## 2018-09-21 DIAGNOSIS — I509 Heart failure, unspecified: Secondary | ICD-10-CM | POA: Insufficient documentation

## 2018-09-21 DIAGNOSIS — Y831 Surgical operation with implant of artificial internal device as the cause of abnormal reaction of the patient, or of later complication, without mention of misadventure at the time of the procedure: Secondary | ICD-10-CM | POA: Diagnosis not present

## 2018-09-21 DIAGNOSIS — G4700 Insomnia, unspecified: Secondary | ICD-10-CM | POA: Insufficient documentation

## 2018-09-21 DIAGNOSIS — I208 Other forms of angina pectoris: Secondary | ICD-10-CM | POA: Diagnosis present

## 2018-09-21 DIAGNOSIS — E119 Type 2 diabetes mellitus without complications: Secondary | ICD-10-CM | POA: Diagnosis not present

## 2018-09-21 HISTORY — PX: INTRAVASCULAR PRESSURE WIRE/FFR STUDY: CATH118243

## 2018-09-21 HISTORY — DX: Abnormal electrocardiogram (ECG) (EKG): R94.31

## 2018-09-21 HISTORY — PX: LEFT HEART CATH AND CORONARY ANGIOGRAPHY: CATH118249

## 2018-09-21 HISTORY — DX: Tobacco use: Z72.0

## 2018-09-21 LAB — POCT ACTIVATED CLOTTING TIME: ACTIVATED CLOTTING TIME: 263 s

## 2018-09-21 SURGERY — LEFT HEART CATH AND CORONARY ANGIOGRAPHY
Anesthesia: LOCAL

## 2018-09-21 MED ORDER — LIDOCAINE HCL (PF) 1 % IJ SOLN
INTRAMUSCULAR | Status: DC | PRN
  Administered 2018-09-21: 2 mL

## 2018-09-21 MED ORDER — SODIUM CHLORIDE 0.9% FLUSH: 3.0000 mL | INTRAVENOUS | Status: DC | PRN

## 2018-09-21 MED ORDER — ASPIRIN 81 MG PO CHEW
81.0000 mg | CHEWABLE_TABLET | ORAL | Status: AC
  Administered 2018-09-21: 81 mg via ORAL
  Filled 2018-09-21: qty 1

## 2018-09-21 MED ORDER — SODIUM CHLORIDE 0.9 % WEIGHT BASED INFUSION
3.0000 mL/kg/h | INTRAVENOUS | Status: DC
  Administered 2018-09-21: 3 mL/kg/h via INTRAVENOUS

## 2018-09-21 MED ORDER — MIDAZOLAM HCL 2 MG/2ML IJ SOLN
INTRAMUSCULAR | Status: AC
  Filled 2018-09-21: qty 2

## 2018-09-21 MED ORDER — KETOROLAC TROMETHAMINE 30 MG/ML IJ SOLN
30.0000 mg | Freq: Once | INTRAMUSCULAR | Status: AC
  Administered 2018-09-21: 30 mg via INTRAVENOUS
  Filled 2018-09-21: qty 1

## 2018-09-21 MED ORDER — FENTANYL CITRATE (PF) 100 MCG/2ML IJ SOLN
INTRAMUSCULAR | Status: AC
  Administered 2018-09-21: 50 ug via INTRAVENOUS
  Filled 2018-09-21: qty 2

## 2018-09-21 MED ORDER — HEPARIN (PORCINE) IN NACL 1000-0.9 UT/500ML-% IV SOLN
INTRAVENOUS | Status: AC
  Filled 2018-09-21: qty 1000

## 2018-09-21 MED ORDER — FENTANYL CITRATE (PF) 100 MCG/2ML IJ SOLN
50.0000 ug | Freq: Once | INTRAMUSCULAR | Status: AC
  Administered 2018-09-21: 50 ug via INTRAVENOUS

## 2018-09-21 MED ORDER — ADENOSINE (DIAGNOSTIC) 140MCG/KG/MIN
INTRAVENOUS | Status: DC | PRN
  Administered 2018-09-21: 140 ug/kg/min via INTRAVENOUS

## 2018-09-21 MED ORDER — VERAPAMIL HCL 2.5 MG/ML IV SOLN
INTRAVENOUS | Status: DC | PRN
  Administered 2018-09-21: 10 mL via INTRA_ARTERIAL

## 2018-09-21 MED ORDER — FENTANYL CITRATE (PF) 100 MCG/2ML IJ SOLN
INTRAMUSCULAR | Status: AC
  Filled 2018-09-21: qty 2

## 2018-09-21 MED ORDER — MIDAZOLAM HCL 2 MG/2ML IJ SOLN
INTRAMUSCULAR | Status: DC | PRN
  Administered 2018-09-21 (×2): 1 mg via INTRAVENOUS
  Administered 2018-09-21: 2 mg via INTRAVENOUS

## 2018-09-21 MED ORDER — SODIUM CHLORIDE 0.9% FLUSH: 3.0000 mL | Freq: Two times a day (BID) | INTRAVENOUS | Status: DC

## 2018-09-21 MED ORDER — SODIUM CHLORIDE 0.9 % IV SOLN: 250.0000 mL | INTRAVENOUS | Status: DC | PRN

## 2018-09-21 MED ORDER — LIDOCAINE HCL (PF) 1 % IJ SOLN
INTRAMUSCULAR | Status: AC
  Filled 2018-09-21: qty 30

## 2018-09-21 MED ORDER — IOHEXOL 350 MG/ML SOLN
INTRAVENOUS | Status: DC | PRN
  Administered 2018-09-21: 140 mL via INTRA_ARTERIAL

## 2018-09-21 MED ORDER — ACETAMINOPHEN 325 MG PO TABS: 650.0000 mg | ORAL_TABLET | ORAL | Status: DC | PRN

## 2018-09-21 MED ORDER — VERAPAMIL HCL 2.5 MG/ML IV SOLN
INTRAVENOUS | Status: AC
  Filled 2018-09-21: qty 2

## 2018-09-21 MED ORDER — HEPARIN SODIUM (PORCINE) 1000 UNIT/ML IJ SOLN
INTRAMUSCULAR | Status: AC
  Filled 2018-09-21: qty 1

## 2018-09-21 MED ORDER — HEPARIN (PORCINE) IN NACL 1000-0.9 UT/500ML-% IV SOLN
INTRAVENOUS | Status: DC | PRN
  Administered 2018-09-21 (×2): 500 mL

## 2018-09-21 MED ORDER — SODIUM CHLORIDE 0.9 % WEIGHT BASED INFUSION: 1.0000 mL/kg/h | INTRAVENOUS | Status: DC

## 2018-09-21 MED ORDER — APIXABAN 5 MG PO TABS: 5.0000 mg | ORAL_TABLET | Freq: Two times a day (BID) | ORAL | Status: DC

## 2018-09-21 MED ORDER — HEPARIN SODIUM (PORCINE) 1000 UNIT/ML IJ SOLN
INTRAMUSCULAR | Status: DC | PRN
  Administered 2018-09-21: 5000 [IU] via INTRAVENOUS
  Administered 2018-09-21: 3000 [IU] via INTRAVENOUS
  Administered 2018-09-21: 5000 [IU] via INTRAVENOUS

## 2018-09-21 MED ORDER — ONDANSETRON HCL 4 MG/2ML IJ SOLN: 4.0000 mg | Freq: Four times a day (QID) | INTRAMUSCULAR | Status: DC | PRN

## 2018-09-21 MED ORDER — SODIUM CHLORIDE 0.9 % WEIGHT BASED INFUSION
1.0000 mL/kg/h | INTRAVENOUS | Status: DC
Start: 2018-09-22 — End: 2018-09-21

## 2018-09-21 MED ORDER — ADENOSINE 12 MG/4ML IV SOLN
INTRAVENOUS | Status: AC
  Filled 2018-09-21: qty 16

## 2018-09-21 MED ORDER — FENTANYL CITRATE (PF) 100 MCG/2ML IJ SOLN
INTRAMUSCULAR | Status: DC | PRN
  Administered 2018-09-21: 25 ug via INTRAVENOUS
  Administered 2018-09-21: 50 ug via INTRAVENOUS

## 2018-09-21 SURGICAL SUPPLY — 12 items
CATH OPTITORQUE TIG 4.0 5F (CATHETERS) ×2 IMPLANT
CATH VISTA GUIDE 6FR XB3.5 (CATHETERS) ×2 IMPLANT
DEVICE RAD COMP TR BAND LRG (VASCULAR PRODUCTS) ×2 IMPLANT
GLIDESHEATH SLEND A-KIT 6F 20G (SHEATH) ×4 IMPLANT
GUIDEWIRE INQWIRE 1.5J.035X260 (WIRE) ×1 IMPLANT
GUIDEWIRE PRESSURE COMET II (WIRE) ×2 IMPLANT
INQWIRE 1.5J .035X260CM (WIRE) ×2
KIT ESSENTIALS PG (KITS) ×2 IMPLANT
KIT HEART LEFT (KITS) ×2 IMPLANT
PACK CARDIAC CATHETERIZATION (CUSTOM PROCEDURE TRAY) ×2 IMPLANT
TRANSDUCER W/STOPCOCK (MISCELLANEOUS) ×2 IMPLANT
TUBING CIL FLEX 10 FLL-RA (TUBING) ×2 IMPLANT

## 2018-09-21 NOTE — Interval H&P Note (Signed)
History and Physical Interval Note:  09/21/2018 2:30 PM  Danny Kelley  has presented today for surgery, with the diagnosis of cp  The various methods of treatment have been discussed with the patient and family. After consideration of risks, benefits and other options for treatment, the patient has consented to  Procedure(s): LEFT HEART CATH AND CORONARY ANGIOGRAPHY (N/A) as a surgical intervention .  The patient's history has been reviewed, patient examined, no change in status, stable for surgery.  I have reviewed the patient's chart and labs.  Questions were answered to the patient's satisfaction.   Symptom Status: Ischemic Symptoms Non-invasive Testing: Not done If no or indeterminate stress test, FFR/iFR results in all diseased vessels: Not done Diabetes Mellitus: No S/P CABG: No Antianginal therapy (number of long-acting drugs): >=2 Patient undergoing renal transplant: No Patient undergoing percutaneous valve procedure: No   1 Vessel Disease No proximal LAD involvement, No proximal left dominant LCX involvement  PCI: Not rated  CABG: Not rated Proximal left dominant LCX involvement  PCI: Not rated  CABG: Not rated Proximal LAD involvement  PCI: Not rated  CABG: Not rated  2 Vessel Disease No proximal LAD involvement  PCI: Not rated  CABG: Not rated Proximal LAD involvement  PCI: Not rated  CABG: Not rated  3 Vessel Disease Low disease complexity (e.g., focal stenoses, SYNTAX <=22)  PCI: Not rated  CABG: Not rated Intermediate or high disease complexity (e.g., SYNTAX >=23)  PCI: Not rated  CABG: Not rated  Left Main Disease Isolated LMCA disease: ostial or midshaft  PCI: A (7);  Indication 24  CABG: A (9);  Indication 24 Isolated LMCA disease: bifurcation involvement  PCI: M (6);  Indication 25  CABG: A (9);  Indication 25 LMCA ostial or midshaft, concurrent low disease burden multivessel disease (e.g., 1-2 additional focal stenoses, SYNTAX <=22)  PCI: A (7);   Indication 26  CABG: A (9);  Indication 26 LMCA ostial or midshaft, concurrent intermediate or high disease burden multivessel disease (e.g., 1-2 additional bifurcation stenoses, long stenoses, SYNTAX >=23)  PCI: M (4);  Indication 27  CABG: A (9);  Indication 27 LMCA bifurcation involvement, concurrent low disease burden multivessel disease (e.g., 1-2 additional focal stenoses, SYNTAX <=22)  PCI: M (6);  Indication 28  CABG: A (9);  Indication 28 LMCA bifurcation involvement, concurrent intermediate or high disease burden multivessel disease (e.g., 1-2 additional bifurcation stenoses, long stenoses, SYNTAX >=23)  PCI: R (3);  Indication 29  CABG: A (9);  Indication 29  Notes:  A indicates appropriate. M indicates may be appropriate. R indicates rarely appropriate. Number in parentheses is median score for that indication. Reclassify indicates number of functionally diseased vessels should be decreased given negative FFR/iFR. Re-evaluate the scenario interpreting any FFR/iFR negative vessel as being not significantly stenosed.  Disease means involved vessel provides flow to a sufficient amount of myocardium to be clinically important.  If FFR testing indicates a vessel is not significant, that vessel should not be considered diseased (and the patient should be reclassified with respect to extent of functionally significant disease).  Proximal LAD + proximal left dominant LCX is considered 3 vessel CAD  2 Vessel CAD with FFR/iFR abnormal in only 1 but not both is considered 1 vessel CAD  Disease complexity includes occlusion, bifurcation, trifurcation, ostial, >20 mm, tortuosity, calcification, thrombus  LMCA disease is >=50% by angiography, MLD <2.8 mm, MLA <6 mm2; MLA 6-7.5 mm2 requires further physiologic  See Table B for risk stratification  based on noninvasive testing  Journal of the [employer] of Cardiology Mar 2017, 23391; DOI:  10.1016/j.[email or web address] [email or web address].[email or web address].full-text.pdf This App  2018 by the Society for Cardiovascular Angiography and Interventions   [name]

## 2018-09-21 NOTE — Progress Notes (Signed)
Dr Einar Gip notified of continued c/o pain and orders noted

## 2018-09-21 NOTE — Discharge Instructions (Signed)
Radial Site Care °Refer to this sheet in the next few weeks. These instructions provide you with information about caring for yourself after your procedure. Your health care provider may also give you more specific instructions. Your treatment has been planned according to current medical practices, but problems sometimes occur. Call your health care provider if you have any problems or questions after your procedure. °What can I expect after the procedure? °After your procedure, it is typical to have the following: °· Bruising at the radial site that usually fades within 1-2 weeks. °· Blood collecting in the tissue (hematoma) that may be painful to the touch. It should usually decrease in size and tenderness within 1-2 weeks. ° °Follow these instructions at home: °· Take medicines only as directed by your health care provider. °· You may shower 24-48 hours after the procedure or as directed by your health care provider. Remove the bandage (dressing) and gently wash the site with plain soap and water. Pat the area dry with a clean towel. Do not rub the site, because this may cause bleeding. °· Do not take baths, swim, or use a hot tub until your health care provider approves. °· Check your insertion site every day for redness, swelling, or drainage. °· Do not apply powder or lotion to the site. °· Do not flex or bend the affected arm for 24 hours or as directed by your health care provider. °· Do not push or pull heavy objects with the affected arm for 24 hours or as directed by your health care provider. °· Do not lift over 10 lb (4.5 kg) for 5 days after your procedure or as directed by your health care provider. °· Ask your health care provider when it is okay to: °? Return to work or school. °? Resume usual physical activities or sports. °? Resume sexual activity. °· Do not drive home if you are discharged the same day as the procedure. Have someone else drive you. °· You may drive 24 hours after the procedure  unless otherwise instructed by your health care provider. °· Do not operate machinery or power tools for 24 hours after the procedure. °· If your procedure was done as an outpatient procedure, which means that you went home the same day as your procedure, a responsible adult should be with you for the first 24 hours after you arrive home. °· Keep all follow-up visits as directed by your health care provider. This is important. °Contact a health care provider if: °· You have a fever. °· You have chills. °· You have increased bleeding from the radial site. Hold pressure on the site. °Get help right away if: °· You have unusual pain at the radial site. °· You have redness, warmth, or swelling at the radial site. °· You have drainage (other than a small amount of blood on the dressing) from the radial site. °· The radial site is bleeding, and the bleeding does not stop after 30 minutes of holding steady pressure on the site. °· Your arm or hand becomes pale, cool, tingly, or numb. °This information is not intended to replace advice given to you by your health care provider. Make sure you discuss any questions you have with your health care provider. °Document Released: 11/22/2010 Document Revised: 03/27/2016 Document Reviewed: 05/08/2014 °Elsevier Interactive Patient Education © 2018 [employer] ° ° ° °Moderate Conscious Sedation, Adult, Care After °These instructions provide you with information about caring for yourself after your procedure. Your health care provider   may also give you more specific instructions. Your treatment has been planned according to current medical practices, but problems sometimes occur. Call your health care provider if you have any problems or questions after your procedure. °What can I expect after the procedure? °After your procedure, it is common: °· To feel sleepy for several hours. °· To feel clumsy and have poor balance for several hours. °· To have poor judgment for several  hours. °· To vomit if you eat too soon. ° °Follow these instructions at home: °For at least 24 hours after the procedure: ° °· Do not: °? Participate in activities where you could fall or become injured. °? Drive. °? Use heavy machinery. °? Drink alcohol. °? Take sleeping pills or medicines that cause drowsiness. °? Make important decisions or sign legal documents. °? Take care of children on your own. °· Rest. °Eating and drinking °· Follow the diet recommended by your health care provider. °· If you vomit: °? Drink water, juice, or soup when you can drink without vomiting. °? Make sure you have little or no nausea before eating solid foods. °General instructions °· Have a responsible adult stay with you until you are awake and alert. °· Take over-the-counter and prescription medicines only as told by your health care provider. °· If you smoke, do not smoke without supervision. °· Keep all follow-up visits as told by your health care provider. This is important. °Contact a health care provider if: °· You keep feeling nauseous or you keep vomiting. °· You feel light-headed. °· You develop a rash. °· You have a fever. °Get help right away if: °· You have trouble breathing. °This information is not intended to replace advice given to you by your health care provider. Make sure you discuss any questions you have with your health care provider. °Document Released: 08/10/2013 Document Revised: 03/24/2016 Document Reviewed: 02/09/2016 °Elsevier Interactive Patient Education © 2018 Elsevier Inc. ° °

## 2018-09-22 ENCOUNTER — Encounter (HOSPITAL_COMMUNITY): Payer: Self-pay | Admitting: Cardiology

## 2018-12-29 ENCOUNTER — Other Ambulatory Visit: Payer: Self-pay | Admitting: Cardiology

## 2019-01-06 ENCOUNTER — Telehealth: Payer: Self-pay

## 2019-01-06 MED ORDER — CLOPIDOGREL BISULFATE 75 MG PO TABS: 75.0000 mg | ORAL_TABLET | Freq: Every day | ORAL | 0 refills | Status: DC

## 2019-01-11 NOTE — Telephone Encounter (Signed)
Pt's wife and called and stated her husband experienced chest pain and stiffness Sunday night and took two nitro's and it helped. Pt is still feeling pressure in his chest. Pt's wife stated he is experiencing Afib as well.   Please Advise.  [place]

## 2019-01-12 NOTE — Telephone Encounter (Signed)
Has he seen EP yet.  I may consider him for a trial study for chest pain. Have Lauren call  for Orisa Trial for angina if he is willing to participate.

## 2019-01-13 NOTE — Telephone Encounter (Signed)
Danny Kelley is aware and will call.

## 2019-01-28 ENCOUNTER — Other Ambulatory Visit: Payer: Self-pay

## 2019-03-14 ENCOUNTER — Other Ambulatory Visit: Payer: Self-pay

## 2019-03-14 NOTE — Telephone Encounter (Signed)
Pt's wife called, pt is still having chest pain.

## 2019-03-15 MED ORDER — APIXABAN 5 MG PO TABS: 5.0000 mg | ORAL_TABLET | Freq: Two times a day (BID) | ORAL | 3 refills | Status: DC

## 2019-03-15 MED ORDER — NITROGLYCERIN 0.4 MG SL SUBL: 0.4000 mg | SUBLINGUAL_TABLET | SUBLINGUAL | 2 refills | Status: DC | PRN

## 2019-03-15 MED ORDER — PRAVASTATIN SODIUM 40 MG PO TABS: ORAL_TABLET | ORAL | 0 refills | Status: DC

## 2019-03-15 NOTE — Telephone Encounter (Signed)
Discontinue Clopidogrel (DES in Jan 2019). Continue Eliquis for AF

## 2019-03-16 ENCOUNTER — Telehealth: Payer: Self-pay

## 2019-03-16 NOTE — Telephone Encounter (Signed)
I will be happy to see him either VV or in the office. For chest pain

## 2019-03-17 ENCOUNTER — Ambulatory Visit: Payer: BLUE CROSS/BLUE SHIELD | Admitting: Cardiology

## 2019-03-17 ENCOUNTER — Encounter: Payer: Self-pay | Admitting: Cardiology

## 2019-03-17 ENCOUNTER — Other Ambulatory Visit: Payer: Self-pay

## 2019-03-17 VITALS — BP 133/88 | HR 77 | Temp 98.9°F | Ht 70.0 in | Wt 240.0 lb

## 2019-03-17 DIAGNOSIS — I10 Essential (primary) hypertension: Secondary | ICD-10-CM

## 2019-03-17 DIAGNOSIS — I25118 Atherosclerotic heart disease of native coronary artery with other forms of angina pectoris: Secondary | ICD-10-CM | POA: Diagnosis not present

## 2019-03-17 DIAGNOSIS — I4891 Unspecified atrial fibrillation: Secondary | ICD-10-CM | POA: Diagnosis not present

## 2019-03-17 MED ORDER — APIXABAN 5 MG PO TABS: 5.0000 mg | ORAL_TABLET | Freq: Two times a day (BID) | ORAL | 3 refills | Status: DC

## 2019-03-17 NOTE — H&P (View-Only) (Signed)
Primary Physician/Referring:  [name], NP  Patient ID: Danny Kelley, male    DOB: 1966/09/03, 53 y.o.   MRN: [id number]  Chief Complaint  Patient presents with  . Chest Pain    pt c/o chest pain     HPI: Danny Kelley  is a 53 y.o. male  with CAD, unstable angina pectoris on 11/26/2017, angioplasty to superdominant left circumflex coronary artery with 4.5 mm resolute DES in the midsegment, patent stent placed in Distal Cx and OM 1 in 2014. He has hypertension, hyperlipidemia, OSA on CPAP and compliant, tobacco use, quit in March 2017, h/o TIA in Feb 2018. Paroxysmal Atrial fib S/P ablation 11/13/2016 by Dr. Rayann Heman. Has maintained sinus on Multaq. He does not tolerate Tikosyn or sotalol due to prolongation in QT.  Due to recurrent episodes of chest discomfort, he underwent coronary angiography on 09/21/2018 revealing patent stents previously placed and also FFR to the intermediate proximal LAD stenosis was insignificant.  He has had multiple episodes of syncope, has been evaluated by neurology and suspicion that it could be cardiogenic syncope. States that when he is walking, suddenly falls to the ground with no explanation. Fortunately has had no injury. No other associated symptoms, no palpitations, no active seizures or incontinence. He has now been diagnosed as having seizures and was started on [facility] 07/27/18.   Patient seen for acute visit today for chest pain. He reports for the last 4-5 days has had chest pain mostly at night and if he tries to do anything that is resolved with nitroglycerin. He generally has chest pain with A fib; however, states this feels different. Chest pain initially around 7/10 and will decrease to a 3/10 with nitroglycerin. Also gets short of breath with this. He has noticed leg swelling for the last one week as well. Dyspnea on exertion is unchanged.   Past Medical History:  Diagnosis Date  . Arthritis    "a little in my right knee"  (07/14/2016)  . CAD (coronary artery disease)   . Chronic congestive heart failure with left ventricular diastolic dysfunction ([place])   . Complication of anesthesia    Pt reports "they have a hard time waking me up"  . GERD (gastroesophageal reflux disease)    hx  . Headache    "with every heart issue that I have" (07/14/2016)  . High cholesterol   . History of hiatal hernia 1990s   "fixed it w/scope down my throat"  . Hypertension   . Myocardial infarction [facility]) 05/2013   stents: left PDA, 1st OM at [facility]  . Obesity   . Obstructive sleep apnea    previous test "inconclusive" per patient,  has appointment with [facility] 10/06/16 for evaluation  . Persistent atrial fibrillation   . Pneumonia ~ 1992  . QT prolongation 07/15/2016  . Seasonal allergies    "I take Allegra prn" (07/14/2016)  . Tobacco abuse 12/16/2016    Past Surgical History:  Procedure Laterality Date  . APPENDECTOMY  1984  . CARDIOVERSION N/A 05/27/2016   Procedure: CARDIOVERSION;  Surgeon: [name], MD;  Location: [facility] ENDOSCOPY;  Service: Cardiovascular;  Laterality: N/A;  . CARDIOVERSION N/A 06/11/2016   Procedure: CARDIOVERSION;  Surgeon: [name], MD;  Location: [place];  Service: Cardiovascular;  Laterality: N/A;  . CORONARY ANGIOPLASTY WITH STENT PLACEMENT  05/30/2013   "2 stents put in at [facility]" & /stent card  . CORONARY STENT INTERVENTION N/A 11/27/2017   Procedure: CORONARY STENT  INTERVENTION;  Surgeon: [name], MD;  Location: [place] CV LAB;  Service: Cardiovascular;  Laterality: N/A;  . ELECTROPHYSIOLOGIC STUDY N/A 11/13/2016   Procedure: Atrial Fibrillation Ablation;  Surgeon: [name], MD;  Location: [place] CV LAB;  Service: Cardiovascular;  Laterality: N/A;  . ESOPHAGOGASTRODUODENOSCOPY (EGD) WITH ESOPHAGEAL DILATION  1990s X 2  . INTRAVASCULAR PRESSURE WIRE/FFR STUDY N/A 09/21/2018   Procedure: INTRAVASCULAR PRESSURE WIRE/FFR STUDY;  Surgeon: [name], MD;   Location: [place] CV LAB;  Service: Cardiovascular;  Laterality: N/A;  . KNEE ARTHROSCOPY Right 1986; ~ 1995 X 2  . LAPAROSCOPIC CHOLECYSTECTOMY  2015  . LEFT HEART CATH AND CORONARY ANGIOGRAPHY N/A 11/27/2017   Procedure: LEFT HEART CATH AND CORONARY ANGIOGRAPHY;  Surgeon: [name], MD;  Location: [place] CV LAB;  Service: Cardiovascular;  Laterality: N/A;  . LEFT HEART CATH AND CORONARY ANGIOGRAPHY N/A 09/21/2018   Procedure: LEFT HEART CATH AND CORONARY ANGIOGRAPHY;  Surgeon: [name], MD;  Location: [place] CV LAB;  Service: Cardiovascular;  Laterality: N/A;  . REPAIR OF ESOPHAGUS  1998   perforation of the distal esophagus from bad heartburn  . TEE WITHOUT CARDIOVERSION N/A 11/13/2016   Procedure: TRANSESOPHAGEAL ECHOCARDIOGRAM (TEE);  Surgeon: Thayer Headings, MD;  Location: [facility] ENDOSCOPY;  Service: Cardiovascular;  Laterality: N/A;    Social History   Socioeconomic History  . Marital status: Married    Spouse name: Not on file  . Number of children: Not on file  . Years of education: Not on file  . Highest education level: Not on file  Occupational History  . Not on file  Social Needs  . Financial resource strain: Not on file  . Food insecurity:    Worry: Not on file    Inability: Not on file  . Transportation needs:    Medical: Not on file    Non-medical: Not on file  Tobacco Use  . Smoking status: Current Some Day Smoker    Packs/day: 1.00    Years: 25.00    Pack years: 25.00    Types: Cigarettes, E-cigarettes    Last attempt to quit: 12/17/2016    Years since quitting: 2.2  . Smokeless tobacco: Never Used  . Tobacco comment: 2-3 cis per day  Substance and Sexual Activity  . Alcohol use: No  . Drug use: No  . Sexual activity: Yes  Lifestyle  . Physical activity:    Days per week: Not on file    Minutes per session: Not on file  . Stress: Not on file  Relationships  . Social connections:    Talks on phone: Not on file    Gets together: Not on  file    Attends religious service: Not on file    Active member of club or organization: Not on file    Attends meetings of clubs or organizations: Not on file    Relationship status: Not on file  . Intimate partner violence:    Fear of current or ex partner: Not on file    Emotionally abused: Not on file    Physically abused: Not on file    Forced sexual activity: Not on file  Other Topics Concern  . Not on file  Social History Narrative   Pt lives in [place] with spouse and 50 year old son.   Works Nurse, adult for [employer].    Current Outpatient Medications on File Prior to Visit  Medication Sig Dispense Refill  . acetaminophen (TYLENOL) 500  MG tablet Take 500 mg by mouth 2 (two) times daily as needed for moderate pain.    Marland Kitchen amLODipine (NORVASC) 10 MG tablet Take 10 mg by mouth daily.    . bisacodyl (DULCOLAX) 5 MG EC tablet Take 5 mg by mouth at bedtime.    . docusate sodium (PX DOCUSATE SODIUM) 100 MG capsule Take 100 mg by mouth 2 (two) times daily.    Marland Kitchen ezetimibe (ZETIA) 10 MG tablet TAKE 1 TABLET BY MOUTH ONCE DAILY 90 tablet 0  . fexofenadine (ALLEGRA) 180 MG tablet Take 180 mg by mouth daily as needed (seasonal allergies).     Marland Kitchen ibuprofen (ADVIL,MOTRIN) 200 MG tablet Take 200 mg by mouth 2 (two) times daily as needed for moderate pain.    . MULTAQ 400 MG tablet Take 400 mg by mouth 2 (two) times daily with a meal.   1  . Nebivolol HCl (BYSTOLIC) 20 MG TABS Take 20 mg by mouth at bedtime.    . nitroGLYCERIN (NITROSTAT) 0.4 MG SL tablet Place 1 tablet (0.4 mg total) under the tongue every 5 (five) minutes x 3 doses as needed for chest pain. 25 tablet 2  . olmesartan-hydrochlorothiazide (BENICAR HCT) 20-12.5 MG tablet Take 1 tablet by mouth daily.    . pravastatin (PRAVACHOL) 40 MG tablet TAKE 1 TABLET BY MOUTH IN THE EVENING AFTER DINNER 90 tablet 0  . torsemide (DEMADEX) 20 MG tablet Take 20 mg by mouth daily.    . ondansetron (ZOFRAN) 4 MG tablet Take 4 mg by mouth  2 (two) times daily as needed for nausea/vomiting.  2  . promethazine (PHENERGAN) 25 MG tablet Take 25 mg by mouth every 6 (six) hours as needed for nausea or vomiting.     . senna-docusate (SENOKOT-S) 8.6-50 MG tablet Take 1 tablet by mouth at bedtime.    . shark liver oil-cocoa butter (PREPARATION H) 0.25-3-85.5 % suppository Place 1 suppository rectally 2 (two) times daily as needed for hemorrhoids.     No current facility-administered medications on file prior to visit.     Review of Systems  Constitution: Positive for malaise/fatigue. Negative for decreased appetite, weight gain and weight loss.  Eyes: Negative for visual disturbance.  Cardiovascular: Positive for chest pain, dyspnea on exertion and leg swelling. Negative for claudication, orthopnea, palpitations and syncope.  Respiratory: Negative for hemoptysis and wheezing.   Endocrine: Negative for cold intolerance and heat intolerance.  Hematologic/Lymphatic: Does not bruise/bleed easily.  Skin: Negative for nail changes.  Musculoskeletal: Negative for muscle weakness and myalgias.  Gastrointestinal: Negative for abdominal pain, change in bowel habit, nausea and vomiting.  Neurological: Positive for dizziness and headaches. Negative for difficulty with concentration and focal weakness.  Psychiatric/Behavioral: Negative for altered mental status and suicidal ideas.  All other systems reviewed and are negative.     Objective  Blood pressure 133/88, pulse 77, temperature 98.9 F (37.2 C), height 5\' 10"  (1.778 m), weight 240 lb (108.9 kg), SpO2 96 %. Body mass index is 34.44 kg/m.    Physical Exam  Constitutional: He is oriented to person, place, and time. Vital signs are normal. He appears well-developed and well-nourished.  Moderately obese  HENT:  Head: Normocephalic and atraumatic.  Neck: Normal range of motion. Neck supple.  Cardiovascular: Normal rate, regular rhythm, normal heart sounds and intact distal pulses.  Trace  leg edema bilateral  Pulmonary/Chest: Effort normal and breath sounds normal. No accessory muscle usage. No respiratory distress.  Abdominal: Soft. Bowel sounds are normal.  Musculoskeletal: Normal range of motion.  Neurological: He is alert and oriented to person, place, and time.  Skin: Skin is warm and dry.  Vitals reviewed.  Radiology: No results found.  Laboratory examination:    CMP Latest Ref Rng & Units 11/28/2017 11/27/2017 12/16/2016  Glucose 65 - 99 mg/dL 96 94 119(H)  BUN 6 - 20 mg/dL 10 14 15   Creatinine 0.61 - 1.24 mg/dL 0.85 0.86 0.80  Sodium 135 - 145 mmol/L 136 140 139  Potassium 3.5 - 5.1 mmol/L 3.3(L) 2.9(L) 3.3(L)  Chloride 101 - 111 mmol/L 103 105 103  CO2 22 - 32 mmol/L 24 24 -  Calcium 8.9 - 10.3 mg/dL 8.2(L) 7.8(L) -  Total Protein 6.5 - 8.1 g/dL - - -  Total Bilirubin 0.3 - 1.2 mg/dL - - -  Alkaline Phos 38 - 126 U/L - - -  AST 15 - 41 U/L - - -  ALT 17 - 63 U/L - - -   CBC Latest Ref Rng & Units 11/28/2017 11/27/2017 12/16/2016  WBC 4.0 - 10.5 K/uL 9.8 10.8(H) -  Hemoglobin 13.0 - 17.0 g/dL 13.6 13.6 16.0  Hematocrit 39.0 - 52.0 % 39.7 40.2 47.0  Platelets 150 - 400 K/uL 242 237 -   Lipid Panel     Component Value Date/Time   CHOL 198 11/27/2017 0634   TRIG 78 11/27/2017 0634   HDL 40 (L) 11/27/2017 0634   CHOLHDL 5.0 11/27/2017 0634   VLDL 16 11/27/2017 0634   LDLCALC 142 (H) 11/27/2017 0634   HEMOGLOBIN A1C Lab Results  Component Value Date   HGBA1C 6.0 (H) 11/27/2017   MPG 125.5 11/27/2017   TSH No results for input(s): TSH in the last 8760 hours.  Cardiac Studies:   Coronary angiogram 09/21/2018: Circumflex stent placed 11/27/2017, 4.5 x 18 mm resolute widely patent, patent 3.5 x 18 mm Xience DES to dominant distal left circumflex, 2.5 x 12 mm Xience to OM1 placed 05/30/2013. Ostial to proximal LAD 60% stenosis, FFR 0.81 and GFR 0.90, not physiologically significant. Mildly elevated LVEDP.  Event Monitor 30 days 07/28/2018: Paroxysmal  episodes of atrial fibrillation with controlled ventricular response was noted along with PVCs. Some of this symptoms of chest pain, palpitations and dizziness correlated with atrial fibrillation, others showed normal sinus rhythm. No heart block, no other significant arrhythmias.  Echo 11/27/2017: Normal LVEF. Mild LVH. Normal diastolic parameters. Mild LA enlargement.  Direct current cardioversion ( Ist 05/27/16) 06/11/2016: 150x2, 200 x1 to NSR on Sotalol.  Successful Cardiac ablation by Dr. [name] on 11/13/2016  Assessment   Atherosclerosis of native coronary artery of native heart with stable angina pectoris ([place]) - Plan: CBC, Basic Metabolic Panel (BMET), SAR CoV2 Serology (COVID 19)AB(IGG)IA, CANCELED: Novel Coronavirus, NAA ([facility])  Atrial fibrillation with controlled ventricular response  - CHA2DS2-VASc Score is 2 with yearly risk of stroke of 2.2%. (CHF; HTN; Age <4, 70-74 and >72; DM; stroke; vasc disease; gender) - Plan: EKG 12-Lead  Essential hypertension  EKG 03/17/2019: Atrial fibrillation with controlled ventricular response at 65 bpm, normal axis, no evidence of ischemia. Normal QT interval.   Recommendations:   Extremely complex patient with recurrent nitroglycerin responsive chest pain at night and affecting his activities.  Feel the patient would best benefit from proceeding with coronary angiogram and will arrange for this to be performed in the next few days. Encouraged him to contact us or proceed to ER for any worsening symptoms. Continue with sublingual nitroglycerin.  Blood pressure is well  controlled.  He is noted to be in atrial fibrillation today that is rate controlled. No acute EKG abnormalities.  He states he is aware of atrial fibrillation, but is not bothered by this.  His chest pain is clearly different than when he has A. fib.  No changes were made to medications today.  We will see him back after coronary angiogram for follow-up and further  recommendations.   *I have discussed this case with Dr. Einar Gip and he personally examined the patient and participated in formulating the plan.*   Miquel Dunn, MSN, APRN, FNP-C [facility]. [place] Office: [phone] Fax: [phone]

## 2019-03-17 NOTE — Progress Notes (Signed)
Primary Physician/Referring:  [name], NP  Patient ID: Danny Kelley, male    DOB: 12/08/1965, 53 y.o.   MRN: [id number]  Chief Complaint  Patient presents with  . Chest Pain    pt c/o chest pain     HPI: Danny Kelley  is a 53 y.o. male  with CAD, unstable angina pectoris on 11/26/2017, angioplasty to superdominant left circumflex coronary artery with 4.5 mm resolute DES in the midsegment, patent stent placed in Distal Cx and OM 1 in 2014. He has hypertension, hyperlipidemia, OSA on CPAP and compliant, tobacco use, quit in March 2017, h/o TIA in Feb 2018. Paroxysmal Atrial fib S/P ablation 11/13/2016 by Dr. Rayann Heman. Has maintained sinus on Multaq. He does not tolerate Tikosyn or sotalol due to prolongation in QT.  Due to recurrent episodes of chest discomfort, he underwent coronary angiography on 09/21/2018 revealing patent stents previously placed and also FFR to the intermediate proximal LAD stenosis was insignificant.  He has had multiple episodes of syncope, has been evaluated by neurology and suspicion that it could be cardiogenic syncope. States that when he is walking, suddenly falls to the ground with no explanation. Fortunately has had no injury. No other associated symptoms, no palpitations, no active seizures or incontinence. He has now been diagnosed as having seizures and was started on [facility] 07/27/18.   Patient seen for acute visit today for chest pain. He reports for the last 4-5 days has had chest pain mostly at night and if he tries to do anything that is resolved with nitroglycerin. He generally has chest pain with A fib; however, states this feels different. Chest pain initially around 7/10 and will decrease to a 3/10 with nitroglycerin. Also gets short of breath with this. He has noticed leg swelling for the last one week as well. Dyspnea on exertion is unchanged.   Past Medical History:  Diagnosis Date  . Arthritis    "a little in my right knee"  (07/14/2016)  . CAD (coronary artery disease)   . Chronic congestive heart failure with left ventricular diastolic dysfunction ([place])   . Complication of anesthesia    Pt reports "they have a hard time waking me up"  . GERD (gastroesophageal reflux disease)    hx  . Headache    "with every heart issue that I have" (07/14/2016)  . High cholesterol   . History of hiatal hernia 1990s   "fixed it w/scope down my throat"  . Hypertension   . Myocardial infarction [facility]) 05/2013   stents: left PDA, 1st OM at [facility]  . Obesity   . Obstructive sleep apnea    previous test "inconclusive" per patient,  has appointment with [facility] Neurology 10/06/16 for evaluation  . Persistent atrial fibrillation   . Pneumonia ~ 1992  . QT prolongation 07/15/2016  . Seasonal allergies    "I take Allegra prn" (07/14/2016)  . Tobacco abuse 12/16/2016    Past Surgical History:  Procedure Laterality Date  . APPENDECTOMY  1984  . CARDIOVERSION N/A 05/27/2016   Procedure: CARDIOVERSION;  Surgeon: [name], MD;  Location: [facility] ENDOSCOPY;  Service: Cardiovascular;  Laterality: N/A;  . CARDIOVERSION N/A 06/11/2016   Procedure: CARDIOVERSION;  Surgeon: [name], MD;  Location: [place];  Service: Cardiovascular;  Laterality: N/A;  . CORONARY ANGIOPLASTY WITH STENT PLACEMENT  05/30/2013   "2 stents put in at [facility]" & /stent card  . CORONARY STENT INTERVENTION N/A 11/27/2017   Procedure: CORONARY STENT  INTERVENTION;  Surgeon: [name], MD;  Location: [place] CV LAB;  Service: Cardiovascular;  Laterality: N/A;  . ELECTROPHYSIOLOGIC STUDY N/A 11/13/2016   Procedure: Atrial Fibrillation Ablation;  Surgeon: [name], MD;  Location: [place] CV LAB;  Service: Cardiovascular;  Laterality: N/A;  . ESOPHAGOGASTRODUODENOSCOPY (EGD) WITH ESOPHAGEAL DILATION  1990s X 2  . INTRAVASCULAR PRESSURE WIRE/FFR STUDY N/A 09/21/2018   Procedure: INTRAVASCULAR PRESSURE WIRE/FFR STUDY;  Surgeon: [name], MD;   Location: [place] CV LAB;  Service: Cardiovascular;  Laterality: N/A;  . KNEE ARTHROSCOPY Right 1986; ~ 1995 X 2  . LAPAROSCOPIC CHOLECYSTECTOMY  2015  . LEFT HEART CATH AND CORONARY ANGIOGRAPHY N/A 11/27/2017   Procedure: LEFT HEART CATH AND CORONARY ANGIOGRAPHY;  Surgeon: [name], MD;  Location: [place] CV LAB;  Service: Cardiovascular;  Laterality: N/A;  . LEFT HEART CATH AND CORONARY ANGIOGRAPHY N/A 09/21/2018   Procedure: LEFT HEART CATH AND CORONARY ANGIOGRAPHY;  Surgeon: [name], MD;  Location: [place] CV LAB;  Service: Cardiovascular;  Laterality: N/A;  . REPAIR OF ESOPHAGUS  1998   perforation of the distal esophagus from bad heartburn  . TEE WITHOUT CARDIOVERSION N/A 11/13/2016   Procedure: TRANSESOPHAGEAL ECHOCARDIOGRAM (TEE);  Surgeon: Thayer Headings, MD;  Location: [facility] ENDOSCOPY;  Service: Cardiovascular;  Laterality: N/A;    Social History   Socioeconomic History  . Marital status: Married    Spouse name: Not on file  . Number of children: Not on file  . Years of education: Not on file  . Highest education level: Not on file  Occupational History  . Not on file  Social Needs  . Financial resource strain: Not on file  . Food insecurity:    Worry: Not on file    Inability: Not on file  . Transportation needs:    Medical: Not on file    Non-medical: Not on file  Tobacco Use  . Smoking status: Current Some Day Smoker    Packs/day: 1.00    Years: 25.00    Pack years: 25.00    Types: Cigarettes, E-cigarettes    Last attempt to quit: 12/17/2016    Years since quitting: 2.2  . Smokeless tobacco: Never Used  . Tobacco comment: 2-3 cis per day  Substance and Sexual Activity  . Alcohol use: No  . Drug use: No  . Sexual activity: Yes  Lifestyle  . Physical activity:    Days per week: Not on file    Minutes per session: Not on file  . Stress: Not on file  Relationships  . Social connections:    Talks on phone: Not on file    Gets together: Not on  file    Attends religious service: Not on file    Active member of club or organization: Not on file    Attends meetings of clubs or organizations: Not on file    Relationship status: Not on file  . Intimate partner violence:    Fear of current or ex partner: Not on file    Emotionally abused: Not on file    Physically abused: Not on file    Forced sexual activity: Not on file  Other Topics Concern  . Not on file  Social History Narrative   Pt lives in [place] with spouse and 29 year old son.   Works Nurse, adult for [employer].    Current Outpatient Medications on File Prior to Visit  Medication Sig Dispense Refill  . acetaminophen (TYLENOL) 500  MG tablet Take 500 mg by mouth 2 (two) times daily as needed for moderate pain.    Marland Kitchen amLODipine (NORVASC) 10 MG tablet Take 10 mg by mouth daily.    . bisacodyl (DULCOLAX) 5 MG EC tablet Take 5 mg by mouth at bedtime.    . docusate sodium (PX DOCUSATE SODIUM) 100 MG capsule Take 100 mg by mouth 2 (two) times daily.    Marland Kitchen ezetimibe (ZETIA) 10 MG tablet TAKE 1 TABLET BY MOUTH ONCE DAILY 90 tablet 0  . fexofenadine (ALLEGRA) 180 MG tablet Take 180 mg by mouth daily as needed (seasonal allergies).     Marland Kitchen ibuprofen (ADVIL,MOTRIN) 200 MG tablet Take 200 mg by mouth 2 (two) times daily as needed for moderate pain.    . MULTAQ 400 MG tablet Take 400 mg by mouth 2 (two) times daily with a meal.   1  . Nebivolol HCl (BYSTOLIC) 20 MG TABS Take 20 mg by mouth at bedtime.    . nitroGLYCERIN (NITROSTAT) 0.4 MG SL tablet Place 1 tablet (0.4 mg total) under the tongue every 5 (five) minutes x 3 doses as needed for chest pain. 25 tablet 2  . olmesartan-hydrochlorothiazide (BENICAR HCT) 20-12.5 MG tablet Take 1 tablet by mouth daily.    . pravastatin (PRAVACHOL) 40 MG tablet TAKE 1 TABLET BY MOUTH IN THE EVENING AFTER DINNER 90 tablet 0  . torsemide (DEMADEX) 20 MG tablet Take 20 mg by mouth daily.    . ondansetron (ZOFRAN) 4 MG tablet Take 4 mg by mouth  2 (two) times daily as needed for nausea/vomiting.  2  . promethazine (PHENERGAN) 25 MG tablet Take 25 mg by mouth every 6 (six) hours as needed for nausea or vomiting.     . senna-docusate (SENOKOT-S) 8.6-50 MG tablet Take 1 tablet by mouth at bedtime.    . shark liver oil-cocoa butter (PREPARATION H) 0.25-3-85.5 % suppository Place 1 suppository rectally 2 (two) times daily as needed for hemorrhoids.     No current facility-administered medications on file prior to visit.     Review of Systems  Constitution: Positive for malaise/fatigue. Negative for decreased appetite, weight gain and weight loss.  Eyes: Negative for visual disturbance.  Cardiovascular: Positive for chest pain, dyspnea on exertion and leg swelling. Negative for claudication, orthopnea, palpitations and syncope.  Respiratory: Negative for hemoptysis and wheezing.   Endocrine: Negative for cold intolerance and heat intolerance.  Hematologic/Lymphatic: Does not bruise/bleed easily.  Skin: Negative for nail changes.  Musculoskeletal: Negative for muscle weakness and myalgias.  Gastrointestinal: Negative for abdominal pain, change in bowel habit, nausea and vomiting.  Neurological: Positive for dizziness and headaches. Negative for difficulty with concentration and focal weakness.  Psychiatric/Behavioral: Negative for altered mental status and suicidal ideas.  All other systems reviewed and are negative.     Objective  Blood pressure 133/88, pulse 77, temperature 98.9 F (37.2 C), height 5\' 10"  (1.778 m), weight 240 lb (108.9 kg), SpO2 96 %. Body mass index is 34.44 kg/m.    Physical Exam  Constitutional: He is oriented to person, place, and time. Vital signs are normal. He appears well-developed and well-nourished.  Moderately obese  HENT:  Head: Normocephalic and atraumatic.  Neck: Normal range of motion. Neck supple.  Cardiovascular: Normal rate, regular rhythm, normal heart sounds and intact distal pulses.  Trace  leg edema bilateral  Pulmonary/Chest: Effort normal and breath sounds normal. No accessory muscle usage. No respiratory distress.  Abdominal: Soft. Bowel sounds are normal.  Musculoskeletal: Normal range of motion.  Neurological: He is alert and oriented to person, place, and time.  Skin: Skin is warm and dry.  Vitals reviewed.  Radiology: No results found.  Laboratory examination:    CMP Latest Ref Rng & Units 11/28/2017 11/27/2017 12/16/2016  Glucose 65 - 99 mg/dL 96 94 119(H)  BUN 6 - 20 mg/dL 10 14 15   Creatinine 0.61 - 1.24 mg/dL 0.85 0.86 0.80  Sodium 135 - 145 mmol/L 136 140 139  Potassium 3.5 - 5.1 mmol/L 3.3(L) 2.9(L) 3.3(L)  Chloride 101 - 111 mmol/L 103 105 103  CO2 22 - 32 mmol/L 24 24 -  Calcium 8.9 - 10.3 mg/dL 8.2(L) 7.8(L) -  Total Protein 6.5 - 8.1 g/dL - - -  Total Bilirubin 0.3 - 1.2 mg/dL - - -  Alkaline Phos 38 - 126 U/L - - -  AST 15 - 41 U/L - - -  ALT 17 - 63 U/L - - -   CBC Latest Ref Rng & Units 11/28/2017 11/27/2017 12/16/2016  WBC 4.0 - 10.5 K/uL 9.8 10.8(H) -  Hemoglobin 13.0 - 17.0 g/dL 13.6 13.6 16.0  Hematocrit 39.0 - 52.0 % 39.7 40.2 47.0  Platelets 150 - 400 K/uL 242 237 -   Lipid Panel     Component Value Date/Time   CHOL 198 11/27/2017 0634   TRIG 78 11/27/2017 0634   HDL 40 (L) 11/27/2017 0634   CHOLHDL 5.0 11/27/2017 0634   VLDL 16 11/27/2017 0634   LDLCALC 142 (H) 11/27/2017 0634   HEMOGLOBIN A1C Lab Results  Component Value Date   HGBA1C 6.0 (H) 11/27/2017   MPG 125.5 11/27/2017   TSH No results for input(s): TSH in the last 8760 hours.  Cardiac Studies:   Coronary angiogram 09/21/2018: Circumflex stent placed 11/27/2017, 4.5 x 18 mm resolute widely patent, patent 3.5 x 18 mm Xience DES to dominant distal left circumflex, 2.5 x 12 mm Xience to OM1 placed 05/30/2013. Ostial to proximal LAD 60% stenosis, FFR 0.81 and GFR 0.90, not physiologically significant. Mildly elevated LVEDP.  Event Monitor 30 days 07/28/2018: Paroxysmal  episodes of atrial fibrillation with controlled ventricular response was noted along with PVCs. Some of this symptoms of chest pain, palpitations and dizziness correlated with atrial fibrillation, others showed normal sinus rhythm. No heart block, no other significant arrhythmias.  Echo 11/27/2017: Normal LVEF. Mild LVH. Normal diastolic parameters. Mild LA enlargement.  Direct current cardioversion ( Ist 05/27/16) 06/11/2016: 150x2, 200 x1 to NSR on Sotalol.  Successful Cardiac ablation by Dr. [name] on 11/13/2016  Assessment   Atherosclerosis of native coronary artery of native heart with stable angina pectoris ([place]) - Plan: CBC, Basic Metabolic Panel (BMET), SAR CoV2 Serology (COVID 19)AB(IGG)IA, CANCELED: Novel Coronavirus, NAA ([facility])  Atrial fibrillation with controlled ventricular response  - CHA2DS2-VASc Score is 2 with yearly risk of stroke of 2.2%. (CHF; HTN; Age <50, 21-74 and >37; DM; stroke; vasc disease; gender) - Plan: EKG 12-Lead  Essential hypertension  EKG 03/17/2019: Atrial fibrillation with controlled ventricular response at 65 bpm, normal axis, no evidence of ischemia. Normal QT interval.   Recommendations:   Extremely complex patient with recurrent nitroglycerin responsive chest pain at night and affecting his activities.  Feel the patient would best benefit from proceeding with coronary angiogram and will arrange for this to be performed in the next few days. Encouraged him to contact us or proceed to ER for any worsening symptoms. Continue with sublingual nitroglycerin.  Blood pressure is well  controlled.  He is noted to be in atrial fibrillation today that is rate controlled. No acute EKG abnormalities.  He states he is aware of atrial fibrillation, but is not bothered by this.  His chest pain is clearly different than when he has A. fib.  No changes were made to medications today.  We will see him back after coronary angiogram for follow-up and further  recommendations.   *I have discussed this case with Dr. Einar Gip and he personally examined the patient and participated in formulating the plan.*   Miquel Dunn, MSN, APRN, FNP-C [facility]. [place]: [phone] Fax: [phone]

## 2019-03-18 ENCOUNTER — Other Ambulatory Visit (HOSPITAL_COMMUNITY)
Admission: RE | Admit: 2019-03-18 | Discharge: 2019-03-18 | Disposition: A | Discharge status: Home / Self Care | Payer: BLUE CROSS/BLUE SHIELD | Source: Ambulatory Visit | Attending: Cardiology | Admitting: Cardiology

## 2019-03-18 ENCOUNTER — Other Ambulatory Visit: Payer: Self-pay | Admitting: Cardiology

## 2019-03-18 DIAGNOSIS — Z1159 Encounter for screening for other viral diseases: Secondary | ICD-10-CM | POA: Insufficient documentation

## 2019-03-18 LAB — BASIC METABOLIC PANEL
BUN/Creatinine Ratio: 16 (ref 9–20)
BUN: 15 mg/dL (ref 6–24)
CO2: 30 mmol/L — ABNORMAL HIGH (ref 20–29)
Calcium: 9.5 mg/dL (ref 8.7–10.2)
Chloride: 102 mmol/L (ref 96–106)
Creatinine, Ser: 0.93 mg/dL (ref 0.76–1.27)
GFR calc Af Amer: 110 mL/min/{1.73_m2} (ref >59)
GFR calc non Af Amer: 95 mL/min/{1.73_m2} (ref >59)
Glucose: 116 mg/dL — ABNORMAL HIGH (ref 65–99)
Potassium: 4.3 mmol/L (ref 3.5–5.2)
Sodium: 139 mmol/L (ref 134–144)

## 2019-03-18 LAB — CBC WITH DIFFERENTIAL/PLATELET
Basophils Absolute: 0 10*3/uL (ref 0.0–0.2)
Basos: 0 %
EOS (ABSOLUTE): 0.2 10*3/uL (ref 0.0–0.4)
Eos: 2 %
Hematocrit: 49.1 % (ref 37.5–51.0)
Hemoglobin: 17.2 g/dL (ref 13.0–17.7)
Lymphocytes Absolute: 2.9 10*3/uL (ref 0.7–3.1)
Lymphs: 28 %
MCH: 28.8 pg (ref 26.6–33.0)
MCHC: 35 g/dL (ref 31.5–35.7)
MCV: 82 fL (ref 79–97)
Monocytes Absolute: 0.9 10*3/uL (ref 0.1–0.9)
Monocytes: 9 %
Neutrophils Absolute: 6.3 10*3/uL (ref 1.4–7.0)
Neutrophils: 61 %
Platelets: 285 10*3/uL (ref 150–450)
RBC: 5.97 x10E6/uL — ABNORMAL HIGH (ref 4.14–5.80)
RDW: 15.6 % — ABNORMAL HIGH (ref 11.6–15.4)
WBC: 10.4 10*3/uL (ref 3.4–10.8)

## 2019-03-18 LAB — SARS CORONAVIRUS 2 BY RT PCR (HOSPITAL ORDER, PERFORMED IN ~~LOC~~ HOSPITAL LAB): SARS Coronavirus 2: NEGATIVE

## 2019-03-19 ENCOUNTER — Encounter: Payer: Self-pay | Admitting: Cardiology

## 2019-03-19 LAB — SAR COV2 SEROLOGY (COVID19)AB(IGG),IA: SARS-CoV-2 Ab, IgG: NEGATIVE

## 2019-03-21 ENCOUNTER — Ambulatory Visit (HOSPITAL_COMMUNITY)
Admission: RE | Admit: 2019-03-21 | Discharge: 2019-03-21 | Disposition: A | Discharge status: Home / Self Care | Payer: BLUE CROSS/BLUE SHIELD | Attending: Cardiology | Admitting: Cardiology

## 2019-03-21 ENCOUNTER — Other Ambulatory Visit: Payer: Self-pay

## 2019-03-21 ENCOUNTER — Encounter (HOSPITAL_COMMUNITY): Payer: Self-pay | Admitting: Cardiology

## 2019-03-21 ENCOUNTER — Ambulatory Visit (HOSPITAL_COMMUNITY)
Admission: RE | Disposition: A | Discharge status: Home / Self Care | Payer: Self-pay | Source: Home / Self Care | Attending: Cardiology

## 2019-03-21 DIAGNOSIS — Z6834 Body mass index (BMI) 34.0-34.9, adult: Secondary | ICD-10-CM | POA: Insufficient documentation

## 2019-03-21 DIAGNOSIS — I25110 Atherosclerotic heart disease of native coronary artery with unstable angina pectoris: Secondary | ICD-10-CM | POA: Diagnosis not present

## 2019-03-21 DIAGNOSIS — Z87891 Personal history of nicotine dependence: Secondary | ICD-10-CM | POA: Insufficient documentation

## 2019-03-21 DIAGNOSIS — I252 Old myocardial infarction: Secondary | ICD-10-CM | POA: Insufficient documentation

## 2019-03-21 DIAGNOSIS — I110 Hypertensive heart disease with heart failure: Secondary | ICD-10-CM | POA: Diagnosis not present

## 2019-03-21 DIAGNOSIS — I5032 Chronic diastolic (congestive) heart failure: Secondary | ICD-10-CM | POA: Diagnosis not present

## 2019-03-21 DIAGNOSIS — I25118 Atherosclerotic heart disease of native coronary artery with other forms of angina pectoris: Secondary | ICD-10-CM | POA: Insufficient documentation

## 2019-03-21 DIAGNOSIS — K219 Gastro-esophageal reflux disease without esophagitis: Secondary | ICD-10-CM | POA: Insufficient documentation

## 2019-03-21 DIAGNOSIS — Z79899 Other long term (current) drug therapy: Secondary | ICD-10-CM | POA: Insufficient documentation

## 2019-03-21 DIAGNOSIS — I4819 Other persistent atrial fibrillation: Secondary | ICD-10-CM | POA: Diagnosis not present

## 2019-03-21 DIAGNOSIS — R079 Chest pain, unspecified: Secondary | ICD-10-CM | POA: Diagnosis present

## 2019-03-21 DIAGNOSIS — E785 Hyperlipidemia, unspecified: Secondary | ICD-10-CM | POA: Insufficient documentation

## 2019-03-21 DIAGNOSIS — E669 Obesity, unspecified: Secondary | ICD-10-CM | POA: Insufficient documentation

## 2019-03-21 DIAGNOSIS — G4733 Obstructive sleep apnea (adult) (pediatric): Secondary | ICD-10-CM | POA: Diagnosis not present

## 2019-03-21 DIAGNOSIS — Z95820 Peripheral vascular angioplasty status with implants and grafts: Secondary | ICD-10-CM

## 2019-03-21 DIAGNOSIS — I209 Angina pectoris, unspecified: Secondary | ICD-10-CM | POA: Diagnosis present

## 2019-03-21 HISTORY — PX: CORONARY STENT INTERVENTION: CATH118234

## 2019-03-21 HISTORY — PX: INTRAVASCULAR PRESSURE WIRE/FFR STUDY: CATH118243

## 2019-03-21 HISTORY — PX: LEFT HEART CATH AND CORONARY ANGIOGRAPHY: CATH118249

## 2019-03-21 LAB — BASIC METABOLIC PANEL
Anion gap: 9 (ref 5–15)
BUN: 13 mg/dL (ref 6–20)
CO2: 24 mmol/L (ref 22–32)
Calcium: 8.3 mg/dL — ABNORMAL LOW (ref 8.9–10.3)
Chloride: 103 mmol/L (ref 98–111)
Creatinine, Ser: 0.92 mg/dL (ref 0.61–1.24)
GFR calc Af Amer: >60 mL/min (ref >60)
GFR calc non Af Amer: >60 mL/min (ref >60)
Glucose, Bld: 110 mg/dL — ABNORMAL HIGH (ref 70–99)
Potassium: 3.6 mmol/L (ref 3.5–5.1)
Sodium: 136 mmol/L (ref 135–145)

## 2019-03-21 LAB — CBC
HCT: 48.1 % (ref 39.0–52.0)
Hemoglobin: 16.5 g/dL (ref 13.0–17.0)
MCH: 28.4 pg (ref 26.0–34.0)
MCHC: 34.3 g/dL (ref 30.0–36.0)
MCV: 82.9 fL (ref 80.0–100.0)
Platelets: 258 10*3/uL (ref 150–400)
RBC: 5.8 MIL/uL (ref 4.22–5.81)
RDW: 14.2 % (ref 11.5–15.5)
WBC: 10.4 10*3/uL (ref 4.0–10.5)
nRBC: 0 % (ref 0.0–0.2)

## 2019-03-21 SURGERY — LEFT HEART CATH AND CORONARY ANGIOGRAPHY
Anesthesia: LOCAL

## 2019-03-21 MED ORDER — SODIUM CHLORIDE 0.9% FLUSH: 3.0000 mL | INTRAVENOUS | Status: DC | PRN

## 2019-03-21 MED ORDER — SODIUM CHLORIDE 0.9% FLUSH: 3.0000 mL | Freq: Two times a day (BID) | INTRAVENOUS | Status: DC

## 2019-03-21 MED ORDER — HYDRALAZINE HCL 20 MG/ML IJ SOLN
5.0000 mg | INTRAMUSCULAR | Status: DC | PRN
  Administered 2019-03-21: 5 mg via INTRAVENOUS
  Filled 2019-03-21: qty 1

## 2019-03-21 MED ORDER — MIDAZOLAM HCL 2 MG/2ML IJ SOLN
INTRAMUSCULAR | Status: DC | PRN
  Administered 2019-03-21 (×2): 1 mg via INTRAVENOUS
  Administered 2019-03-21: 2 mg via INTRAVENOUS

## 2019-03-21 MED ORDER — HEPARIN (PORCINE) IN NACL 1000-0.9 UT/500ML-% IV SOLN
INTRAVENOUS | Status: DC | PRN
  Administered 2019-03-21 (×3): 500 mL

## 2019-03-21 MED ORDER — SODIUM CHLORIDE 0.9 % WEIGHT BASED INFUSION: 1.0000 mL/kg/h | INTRAVENOUS | Status: DC

## 2019-03-21 MED ORDER — VERAPAMIL HCL 2.5 MG/ML IV SOLN
INTRA_ARTERIAL | Status: DC | PRN
  Administered 2019-03-21: 08:00:00 10 mL via INTRA_ARTERIAL

## 2019-03-21 MED ORDER — VERAPAMIL HCL 2.5 MG/ML IV SOLN
INTRAVENOUS | Status: AC
  Filled 2019-03-21: qty 2

## 2019-03-21 MED ORDER — HEPARIN SODIUM (PORCINE) 1000 UNIT/ML IJ SOLN
INTRAMUSCULAR | Status: AC
  Filled 2019-03-21: qty 1

## 2019-03-21 MED ORDER — CLOPIDOGREL BISULFATE 75 MG PO TABS: 75.0000 mg | ORAL_TABLET | Freq: Every day | ORAL | 0 refills | Status: DC

## 2019-03-21 MED ORDER — NITROGLYCERIN IN D5W 200-5 MCG/ML-% IV SOLN
INTRAVENOUS | Status: AC
  Filled 2019-03-21: qty 250

## 2019-03-21 MED ORDER — SODIUM CHLORIDE 0.9 % IV SOLN: 250.0000 mL | INTRAVENOUS | Status: DC | PRN

## 2019-03-21 MED ORDER — ADENOSINE 12 MG/4ML IV SOLN
INTRAVENOUS | Status: AC
  Filled 2019-03-21: qty 16

## 2019-03-21 MED ORDER — IOHEXOL 350 MG/ML SOLN
INTRAVENOUS | Status: DC | PRN
  Administered 2019-03-21: 09:00:00 165 mL via INTRACARDIAC

## 2019-03-21 MED ORDER — MIDAZOLAM HCL 2 MG/2ML IJ SOLN
INTRAMUSCULAR | Status: AC
  Filled 2019-03-21: qty 2

## 2019-03-21 MED ORDER — CLOPIDOGREL BISULFATE 75 MG PO TABS: 75.0000 mg | ORAL_TABLET | Freq: Every day | ORAL | 0 refills | Status: AC

## 2019-03-21 MED ORDER — CLOPIDOGREL BISULFATE 300 MG PO TABS
600.0000 mg | ORAL_TABLET | ORAL | Status: AC
  Administered 2019-03-21: 600 mg via ORAL
  Filled 2019-03-21: qty 2

## 2019-03-21 MED ORDER — ASPIRIN 81 MG PO CHEW: 81.0000 mg | CHEWABLE_TABLET | Freq: Every day | ORAL | Status: DC

## 2019-03-21 MED ORDER — ONDANSETRON HCL 4 MG/2ML IJ SOLN: 4.0000 mg | Freq: Four times a day (QID) | INTRAMUSCULAR | Status: DC | PRN

## 2019-03-21 MED ORDER — HEPARIN SODIUM (PORCINE) 1000 UNIT/ML IJ SOLN
INTRAMUSCULAR | Status: DC | PRN
  Administered 2019-03-21: 5000 [IU] via INTRAVENOUS
  Administered 2019-03-21: 3000 [IU] via INTRAVENOUS
  Administered 2019-03-21: 4000 [IU] via INTRAVENOUS
  Administered 2019-03-21: 3000 [IU] via INTRAVENOUS

## 2019-03-21 MED ORDER — ASPIRIN 81 MG PO CHEW
81.0000 mg | CHEWABLE_TABLET | ORAL | Status: AC
  Administered 2019-03-21: 81 mg via ORAL

## 2019-03-21 MED ORDER — LIDOCAINE HCL (PF) 1 % IJ SOLN
INTRAMUSCULAR | Status: DC | PRN
  Administered 2019-03-21: 2 mL

## 2019-03-21 MED ORDER — NITROGLYCERIN 1 MG/10 ML FOR IR/CATH LAB
INTRA_ARTERIAL | Status: DC | PRN
  Administered 2019-03-21: 200 ug via INTRACORONARY

## 2019-03-21 MED ORDER — FENTANYL CITRATE (PF) 100 MCG/2ML IJ SOLN
INTRAMUSCULAR | Status: AC
  Filled 2019-03-21: qty 2

## 2019-03-21 MED ORDER — ACETAMINOPHEN 325 MG PO TABS: 650.0000 mg | ORAL_TABLET | ORAL | Status: DC | PRN

## 2019-03-21 MED ORDER — ADENOSINE (DIAGNOSTIC) 140MCG/KG/MIN
INTRAVENOUS | Status: DC | PRN
  Administered 2019-03-21: 140 ug/kg/min via INTRAVENOUS

## 2019-03-21 MED ORDER — ASPIRIN 81 MG PO TABS: 81.0000 mg | ORAL_TABLET | Freq: Every day | ORAL | 0 refills | Status: AC

## 2019-03-21 MED ORDER — ASPIRIN 81 MG PO CHEW
CHEWABLE_TABLET | ORAL | Status: AC
  Filled 2019-03-21: qty 1

## 2019-03-21 MED ORDER — LIDOCAINE HCL (PF) 1 % IJ SOLN
INTRAMUSCULAR | Status: AC
  Filled 2019-03-21: qty 30

## 2019-03-21 MED ORDER — FENTANYL CITRATE (PF) 100 MCG/2ML IJ SOLN
INTRAMUSCULAR | Status: DC | PRN
  Administered 2019-03-21 (×3): 25 ug via INTRAVENOUS

## 2019-03-21 MED ORDER — HEPARIN (PORCINE) IN NACL 1000-0.9 UT/500ML-% IV SOLN
INTRAVENOUS | Status: AC
  Filled 2019-03-21: qty 500

## 2019-03-21 MED ORDER — SODIUM CHLORIDE 0.9 % WEIGHT BASED INFUSION
3.0000 mL/kg/h | INTRAVENOUS | Status: AC
  Administered 2019-03-21: 3 mL/kg/h via INTRAVENOUS

## 2019-03-21 SURGICAL SUPPLY — 16 items
CATH OPTITORQUE TIG 4.0 5F (CATHETERS) ×2 IMPLANT
CATH VISTA GUIDE 6FR XBLAD3.5 (CATHETERS) ×2 IMPLANT
DEVICE RAD COMP TR BAND LRG (VASCULAR PRODUCTS) ×2 IMPLANT
GLIDESHEATH SLEND A-KIT 6F 22G (SHEATH) ×2 IMPLANT
GUIDEWIRE INQWIRE 1.5J.035X260 (WIRE) ×1 IMPLANT
GUIDEWIRE PRESSURE COMET II (WIRE) ×2 IMPLANT
INQWIRE 1.5J .035X260CM (WIRE) ×2
KIT ENCORE 26 ADVANTAGE (KITS) ×2 IMPLANT
KIT ESSENTIALS PG (KITS) ×2 IMPLANT
KIT HEART LEFT (KITS) ×2 IMPLANT
PACK CARDIAC CATHETERIZATION (CUSTOM PROCEDURE TRAY) ×2 IMPLANT
SHEATH PROBE COVER 6X72 (BAG) ×2 IMPLANT
STENT SYNERGY DES 2.5X24 (Permanent Stent) ×2 IMPLANT
TRANSDUCER W/STOPCOCK (MISCELLANEOUS) ×2 IMPLANT
TUBING CIL FLEX 10 FLL-RA (TUBING) ×2 IMPLANT
WIRE COUGAR XT STRL 190CM (WIRE) ×2 IMPLANT

## 2019-03-21 NOTE — Progress Notes (Signed)
Spoke with Mitzi Hansen (pharmacy) about if he needs to be seen by a Pharmacist or not. Mitzi Hansen states he will page the Dr and see.

## 2019-03-21 NOTE — Progress Notes (Addendum)
Dr Einar Gip called and informed of BP states ok to give Hydrazine States ok to discharge pt home when he is ready, as long as Bp doesn't go up (as long as bp doesn't go in the 100's dys )

## 2019-03-21 NOTE — Progress Notes (Signed)
[id number] Discussed with pt the importance of plavix with stent. Has been on plavix before. Discussed NTG use, heart healthy food choices, and CRP 2. Referring to [facility] program. Did not give ex ed as pt stated he is for neuro workup with his syncope. Stated he fell 6 times last week and does not get up now at home unless someone with him. Stated he has been told to sit at home and not get up unless assisted. Pt stated appetite is poor but foods he can tolerate are more of the heart healthy. Pt knows he will not be able to begin CRP 2 until neuro eval completed and not risk for fall. Graylon Good RN BSN 03/21/2019 11:55 AM

## 2019-03-21 NOTE — Progress Notes (Signed)
Central tele called me about pt having 2 pauses one was 2.13 and the other was 2.8sec. Dr Einar Gip called and informed. No new orders at this time.

## 2019-03-21 NOTE — Progress Notes (Signed)
Pharmacy called about prescription for Plavix.  Patient states he just picked up a plavix prescription yesterday and has been taking plavix. Does not need to pay for a new prescription when already has one.

## 2019-03-21 NOTE — Interval H&P Note (Signed)
History and Physical Interval Note:  03/21/2019 7:44 AM  Danny Kelley  has presented today for surgery, with the diagnosis of chest pain.  The various methods of treatment have been discussed with the patient and family. After consideration of risks, benefits and other options for treatment, the patient has consented to  Procedure(s): LEFT HEART CATH AND CORONARY ANGIOGRAPHY (N/A) and possible angioplasty as a surgical intervention.  The patient's history has been reviewed, patient examined, no change in status, stable for surgery.  I have reviewed the patient's chart and labs.  Questions were answered to the patient's satisfaction.    Symptom Status: Ischemic Symptoms Non-invasive Testing: Not done If no or indeterminate stress test, FFR/iFR results in all diseased vessels: Not done Diabetes Mellitus: No S/P CABG: No Antianginal therapy (number of long-acting drugs): >=2 Patient undergoing renal transplant: No Patient undergoing percutaneous valve procedure: No   1 Vessel Disease No proximal LAD involvement, No proximal left dominant LCX involvement  PCI: Not rated  CABG: Not rated Proximal left dominant LCX involvement  PCI: Not rated  CABG: Not rated Proximal LAD involvement  PCI: Not rated  CABG: Not rated  2 Vessel Disease No proximal LAD involvement  PCI: Not rated  CABG: Not rated Proximal LAD involvement  PCI: Not rated  CABG: Not rated  3 Vessel Disease Low disease complexity (e.g., focal stenoses, SYNTAX <=22)  PCI: Not rated  CABG: Not rated Intermediate or high disease complexity (e.g., SYNTAX >=23)  PCI: Not rated  CABG: Not rated  Left Main Disease Isolated LMCA disease: ostial or midshaft  PCI: A (7);  Indication 24  CABG: A (9);  Indication 24 Isolated LMCA disease: bifurcation involvement  PCI: M (6);  Indication 25  CABG: A (9);  Indication 25 LMCA ostial or midshaft, concurrent low disease burden multivessel disease (e.g., 1-2 additional focal  stenoses, SYNTAX <=22)  PCI: A (7);  Indication 26  CABG: A (9);  Indication 26 LMCA ostial or midshaft, concurrent intermediate or high disease burden multivessel disease (e.g., 1-2 additional bifurcation stenoses, long stenoses, SYNTAX >=23)  PCI: M (4);  Indication 27  CABG: A (9);  Indication 27 LMCA bifurcation involvement, concurrent low disease burden multivessel disease (e.g., 1-2 additional focal stenoses, SYNTAX <=22)  PCI: M (6);  Indication 28  CABG: A (9);  Indication 28 LMCA bifurcation involvement, concurrent intermediate or high disease burden multivessel disease (e.g., 1-2 additional bifurcation stenoses, long stenoses, SYNTAX >=23)  PCI: R (3);  Indication 29  CABG: A (9);  Indication 29  Notes:  A indicates appropriate. M indicates may be appropriate. R indicates rarely appropriate. Number in parentheses is median score for that indication. Reclassify indicates number of functionally diseased vessels should be decreased given negative FFR/iFR. Re-evaluate the scenario interpreting any FFR/iFR negative vessel as being not significantly stenosed.  Disease means involved vessel provides flow to a sufficient amount of myocardium to be clinically important.  If FFR testing indicates a vessel is not significant, that vessel should not be considered diseased (and the patient should be reclassified with respect to extent of functionally significant disease).  Proximal LAD + proximal left dominant LCX is considered 3 vessel CAD  2 Vessel CAD with FFR/iFR abnormal in only 1 but not both is considered 1 vessel CAD  Disease complexity includes occlusion, bifurcation, trifurcation, ostial, >20 mm, tortuosity, calcification, thrombus  LMCA disease is >=50% by angiography, MLD <2.8 mm, MLA <6 mm2; MLA 6-7.5 mm2 requires further physiologic  See Table  B for risk stratification based on noninvasive testing  Journal of the [employer] of Cardiology Mar 2017, 23391; DOI:  10.1016/j.jacc.2017.02.001 [employer].de.[email or web address].full-text.pdf This App  2018 by the Society for Cardiovascular Angiography and Interventions  [name]

## 2019-03-21 NOTE — Discharge Instructions (Signed)
Drink plenty of fluids °Keep right arm at or above heart level  °Radial Site Care ° °This sheet gives you information about how to care for yourself after your procedure. Your health care provider may also give you more specific instructions. If you have problems or questions, contact your health care provider. °What can I expect after the procedure? °After the procedure, it is common to have: °· Bruising and tenderness at the catheter insertion area. °Follow these instructions at home: °Medicines °· Take over-the-counter and prescription medicines only as told by your health care provider. °Insertion site care °· Follow instructions from your health care provider about how to take care of your insertion site. Make sure you: °? Wash your hands with soap and water before you change your bandage (dressing). If soap and water are not available, use hand sanitizer. °? Change your dressing as told by your health care provider. °? Leave stitches (sutures), skin glue, or adhesive strips in place. These skin closures may need to stay in place for 2 weeks or longer. If adhesive strip edges start to loosen and curl up, you may trim the loose edges. Do not remove adhesive strips completely unless your health care provider tells you to do that. °· Check your insertion site every day for signs of infection. Check for: °? Redness, swelling, or pain. °? Fluid or blood. °? Pus or a bad smell. °? Warmth. °· Do not take baths, swim, or use a hot tub until your health care provider approves. °· You may shower 24-48 hours after the procedure, or as directed by your health care provider. °? Remove the dressing and gently wash the site with plain soap and water. °? Pat the area dry with a clean towel. °? Do not rub the site. That could cause bleeding. °· Do not apply powder or lotion to the site. °Activity ° °· For 24 hours after the procedure, or as directed by your health care provider: °? Do not flex or bend the affected arm. °? Do  not push or pull heavy objects with the affected arm. °? Do not drive yourself home from the hospital or clinic. You may drive 24 hours after the procedure unless your health care provider tells you not to. °? Do not operate machinery or power tools. °· Do not lift anything that is heavier than 10 lb (4.5 kg), or the limit that you are told, until your health care provider says that it is safe. °· Ask your health care provider when it is okay to: °? Return to work or school. °? Resume usual physical activities or sports. °? Resume sexual activity. °General instructions °· If the catheter site starts to bleed, raise your arm and put firm pressure on the site. If the bleeding does not stop, get help right away. This is a medical emergency. °· If you went home on the same day as your procedure, a responsible adult should be with you for the first 24 hours after you arrive home. °· Keep all follow-up visits as told by your health care provider. This is important. °Contact a health care provider if: °· You have a fever. °· You have redness, swelling, or yellow drainage around your insertion site. °Get help right away if: °· You have unusual pain at the radial site. °· The catheter insertion area swells very fast. °· The insertion area is bleeding, and the bleeding does not stop when you hold steady pressure on the area. °· Your arm or hand   hand becomes pale, cool, tingly, or numb. °These symptoms may represent a serious problem that is an emergency. Do not wait to see if the symptoms will go away. Get medical help right away. Call your local emergency services (911 in the [place] Do not drive yourself to the hospital. °Summary °· After the procedure, it is common to have bruising and tenderness at the site. °· Follow instructions from your health care provider about how to take care of your radial site wound. Check the wound every day for signs of infection. °· Do not lift anything that is heavier than 10 lb (4.5 kg), or the  limit that you are told, until your health care provider says that it is safe. °This information is not intended to replace advice given to you by your health care provider. Make sure you discuss any questions you have with your health care provider. °Document Released: 11/22/2010 Document Revised: 11/25/2017 Document Reviewed: 11/25/2017 °Elsevier Interactive Patient Education © 2019 [employer] ° °

## 2019-03-21 NOTE — Progress Notes (Signed)
Pt c/o chest pain states he has had this pain for a while now and the Dr is aware. Rennis Harding, Rn informed.

## 2019-03-22 LAB — POCT ACTIVATED CLOTTING TIME
Activated Clotting Time: 252 seconds
Activated Clotting Time: 378 seconds

## 2019-03-22 MED FILL — Nitroglycerin IV Soln 200 MCG/ML in D5W: INTRAVENOUS | Qty: 250 | Status: AC

## 2019-03-22 NOTE — Telephone Encounter (Signed)
error 

## 2019-03-30 ENCOUNTER — Telehealth: Payer: Self-pay

## 2019-03-30 NOTE — Telephone Encounter (Signed)
Pt wife called c/o bp 170/120; Pt is having cp, tunnel vision, blurry and doesn't feel good; Pt has just gotten a cath; Pt was told to take a extra bystolic for the bp one time and it helped; Per AK he is to take another  bystolic and his follow up is tomorrow;

## 2019-03-31 ENCOUNTER — Encounter: Payer: Self-pay | Admitting: Cardiology

## 2019-03-31 ENCOUNTER — Ambulatory Visit: Payer: BLUE CROSS/BLUE SHIELD | Admitting: Cardiology

## 2019-03-31 ENCOUNTER — Other Ambulatory Visit: Payer: Self-pay

## 2019-03-31 VITALS — BP 144/106 | HR 72 | Ht 70.0 in | Wt 223.4 lb

## 2019-03-31 DIAGNOSIS — F459 Somatoform disorder, unspecified: Secondary | ICD-10-CM

## 2019-03-31 DIAGNOSIS — I4819 Other persistent atrial fibrillation: Secondary | ICD-10-CM | POA: Diagnosis not present

## 2019-03-31 DIAGNOSIS — I10 Essential (primary) hypertension: Secondary | ICD-10-CM | POA: Diagnosis not present

## 2019-03-31 DIAGNOSIS — F329 Major depressive disorder, single episode, unspecified: Secondary | ICD-10-CM

## 2019-03-31 DIAGNOSIS — I25118 Atherosclerotic heart disease of native coronary artery with other forms of angina pectoris: Secondary | ICD-10-CM | POA: Diagnosis not present

## 2019-03-31 DIAGNOSIS — I5032 Chronic diastolic (congestive) heart failure: Secondary | ICD-10-CM | POA: Diagnosis not present

## 2019-03-31 MED ORDER — ESCITALOPRAM OXALATE 10 MG PO TABS: 10.0000 mg | ORAL_TABLET | Freq: Every day | ORAL | 1 refills | Status: DC

## 2019-03-31 MED ORDER — CLONIDINE 0.2 MG/24HR TD PTWK: 0.2000 mg | MEDICATED_PATCH | TRANSDERMAL | 2 refills | Status: DC

## 2019-03-31 NOTE — Progress Notes (Signed)
Primary Physician/Referring:  [name], NP  Patient ID: Danny Kelley, male    DOB: 09-09-1966, 52 y.o.   MRN: [id number]  Chief Complaint  Patient presents with  . Chest Pain  . Atrial Fibrillation    10 f/u cath  . Hypertension    HPI: Danny Kelley  is a 53 y.o. male  with CAD, unstable angina pectoris on 11/26/2017, angioplasty to superdominant left circumflex coronary artery with 4.5 mm resolute DES in the midsegment, patent stent placed in Distal Cx and OM 1 in 2014.   He has hypertension, hyperlipidemia, OSA on CPAP and compliant, tobacco use, quit in March 2017, h/o TIA in Feb 2018 while on anticoagulants, paroxysmal Atrial fib S/P ablation 11/13/2016 by Dr. Rayann Heman.   He had maintained sinus on Multaq. He does not tolerate Tikosyn or sotalol due to prolongation in QT, However due to persistent atrial fibrillation over the past 2-3 office visits and patient not really complaining about palpitations or fatigue, but Was discontinued and rate control strategy was recommended.  Due to recurrent chest pain, shortness of breath, nitrate responsiveness, he underwent repeat cardiac catheterization on 03/22/2019, stenting to high-grade stenosis in the distal dominant Circumflex PDA branch.  He now presents to the office, states that he has been having constant chest pain, blood pressure has been uncontrolled, has marked fatigue and dyspnea, has difficulty sleeping, has lack of appetite, overall does not feel well.  He has now been diagnosed as having seizures and was started on [facility] 07/27/18. Also apart from seizures In the past he was also evaluate for recurrent syncope which neurology evaluation was unyielding and felt to be cardiogenic but I felt it was probably psychogenic.    Past Medical History:  Diagnosis Date  . Arthritis    "a little in my right knee" (07/14/2016)  . CAD (coronary artery disease)   . Chronic congestive heart failure with left ventricular diastolic  dysfunction ([place])   . Complication of anesthesia    Pt reports "they have a hard time waking me up"  . GERD (gastroesophageal reflux disease)    hx  . Headache    "with every heart issue that I have" (07/14/2016)  . High cholesterol   . History of hiatal hernia 1990s   "fixed it w/scope down my throat"  . Hypertension   . Myocardial infarction [facility]) 05/2013   stents: left PDA, 1st OM at [facility]  . Obesity   . Obstructive sleep apnea    previous test "inconclusive" per patient,  has appointment with [facility] Neurology 10/06/16 for evaluation  . Persistent atrial fibrillation   . Pneumonia ~ 1992  . QT prolongation 07/15/2016  . Seasonal allergies    "I take Allegra prn" (07/14/2016)  . Tobacco abuse 12/16/2016    Past Surgical History:  Procedure Laterality Date  . APPENDECTOMY  1984  . CARDIOVERSION N/A 05/27/2016   Procedure: CARDIOVERSION;  Surgeon: [name], MD;  Location: [facility] ENDOSCOPY;  Service: Cardiovascular;  Laterality: N/A;  . CARDIOVERSION N/A 06/11/2016   Procedure: CARDIOVERSION;  Surgeon: [name], MD;  Location: [place];  Service: Cardiovascular;  Laterality: N/A;  . CORONARY ANGIOPLASTY WITH STENT PLACEMENT  05/30/2013   "2 stents put in at [facility]" & /stent card  . CORONARY STENT INTERVENTION N/A 11/27/2017   Procedure: CORONARY STENT INTERVENTION;  Surgeon: [name], MD;  Location: [place] CV LAB;  Service: Cardiovascular;  Laterality: N/A;  . CORONARY STENT INTERVENTION N/A 03/21/2019  Procedure: CORONARY STENT INTERVENTION;  Surgeon: [name], MD;  Location: [place] CV LAB;  Service: Cardiovascular;  Laterality: N/A;  . ELECTROPHYSIOLOGIC STUDY N/A 11/13/2016   Procedure: Atrial Fibrillation Ablation;  Surgeon: [name], MD;  Location: [place] CV LAB;  Service: Cardiovascular;  Laterality: N/A;  . ESOPHAGOGASTRODUODENOSCOPY (EGD) WITH ESOPHAGEAL DILATION  1990s X 2  . INTRAVASCULAR PRESSURE WIRE/FFR STUDY N/A 09/21/2018    Procedure: INTRAVASCULAR PRESSURE WIRE/FFR STUDY;  Surgeon: [name], MD;  Location: [place] CV LAB;  Service: Cardiovascular;  Laterality: N/A;  . INTRAVASCULAR PRESSURE WIRE/FFR STUDY N/A 03/21/2019   Procedure: INTRAVASCULAR PRESSURE WIRE/FFR STUDY;  Surgeon: [name], MD;  Location: [place] CV LAB;  Service: Cardiovascular;  Laterality: N/A;  . KNEE ARTHROSCOPY Right 1986; ~ 1995 X 2  . LAPAROSCOPIC CHOLECYSTECTOMY  2015  . LEFT HEART CATH AND CORONARY ANGIOGRAPHY N/A 11/27/2017   Procedure: LEFT HEART CATH AND CORONARY ANGIOGRAPHY;  Surgeon: [name], MD;  Location: [place] CV LAB;  Service: Cardiovascular;  Laterality: N/A;  . LEFT HEART CATH AND CORONARY ANGIOGRAPHY N/A 09/21/2018   Procedure: LEFT HEART CATH AND CORONARY ANGIOGRAPHY;  Surgeon: [name], MD;  Location: [place] CV LAB;  Service: Cardiovascular;  Laterality: N/A;  . LEFT HEART CATH AND CORONARY ANGIOGRAPHY N/A 03/21/2019   Procedure: LEFT HEART CATH AND CORONARY ANGIOGRAPHY;  Surgeon: [name], MD;  Location: [place] CV LAB;  Service: Cardiovascular;  Laterality: N/A;  . REPAIR OF ESOPHAGUS  1998   perforation of the distal esophagus from bad heartburn  . TEE WITHOUT CARDIOVERSION N/A 11/13/2016   Procedure: TRANSESOPHAGEAL ECHOCARDIOGRAM (TEE);  Surgeon: Thayer Headings, MD;  Location: [facility] ENDOSCOPY;  Service: Cardiovascular;  Laterality: N/A;    Social History   Socioeconomic History  . Marital status: Married    Spouse name: Not on file  . Number of children: 2  . Years of education: Not on file  . Highest education level: Not on file  Occupational History  . Not on file  Social Needs  . Financial resource strain: Not on file  . Food insecurity:    Worry: Not on file    Inability: Not on file  . Transportation needs:    Medical: Not on file    Non-medical: Not on file  Tobacco Use  . Smoking status: Current Some Day Smoker    Packs/day: 1.00    Years: 25.00    Pack years: 25.00     Types: Cigarettes, E-cigarettes    Last attempt to quit: 12/17/2016    Years since quitting: 2.2  . Smokeless tobacco: Never Used  . Tobacco comment: 2-3 cis per day  Substance and Sexual Activity  . Alcohol use: No  . Drug use: No  . Sexual activity: Yes  Lifestyle  . Physical activity:    Days per week: Not on file    Minutes per session: Not on file  . Stress: Not on file  Relationships  . Social connections:    Talks on phone: Not on file    Gets together: Not on file    Attends religious service: Not on file    Active member of club or organization: Not on file    Attends meetings of clubs or organizations: Not on file    Relationship status: Not on file  . Intimate partner violence:    Fear of current or ex partner: Not on file    Emotionally abused: Not on file  Physically abused: Not on file    Forced sexual activity: Not on file  Other Topics Concern  . Not on file  Social History Narrative   Pt lives in [place] with spouse and 48 year old son.   Works Nurse, adult for [employer].    Current Outpatient Medications on File Prior to Visit  Medication Sig Dispense Refill  . acetaminophen (TYLENOL) 500 MG tablet Take 500 mg by mouth 2 (two) times daily as needed for moderate pain.    Marland Kitchen amLODipine (NORVASC) 10 MG tablet Take 10 mg by mouth daily.    Marland Kitchen apixaban (ELIQUIS) 5 MG TABS tablet Take 1 tablet (5 mg total) by mouth 2 (two) times daily. 180 tablet 3  . aspirin 81 MG tablet Take 1 tablet (81 mg total) by mouth daily for 30 days. (Patient taking differently: Take 81 mg by mouth daily. Stop after 4 weeks after 03/21/2019  PCI) 30 tablet 0  . bisacodyl (DULCOLAX) 5 MG EC tablet Take 5 mg by mouth at bedtime.    . clopidogrel (PLAVIX) 75 MG tablet Take 1 tablet (75 mg total) by mouth daily for 30 days. 30 tablet 0  . docusate sodium (PX DOCUSATE SODIUM) 100 MG capsule Take 100 mg by mouth 2 (two) times daily.    Marland Kitchen ezetimibe (ZETIA) 10 MG tablet TAKE 1 TABLET  BY MOUTH ONCE DAILY 90 tablet 0  . fexofenadine (ALLEGRA) 180 MG tablet Take 180 mg by mouth daily as needed (seasonal allergies).     Marland Kitchen ibuprofen (ADVIL,MOTRIN) 200 MG tablet Take 200 mg by mouth 2 (two) times daily as needed for moderate pain.    . Nebivolol HCl (BYSTOLIC) 20 MG TABS Take 20 mg by mouth at bedtime.    . nitroGLYCERIN (NITROSTAT) 0.4 MG SL tablet Place 1 tablet (0.4 mg total) under the tongue every 5 (five) minutes x 3 doses as needed for chest pain. 25 tablet 2  . olmesartan-hydrochlorothiazide (BENICAR HCT) 20-12.5 MG tablet Take 1 tablet by mouth daily.    . ondansetron (ZOFRAN) 4 MG tablet Take 4 mg by mouth 2 (two) times daily as needed for nausea/vomiting.  2  . pravastatin (PRAVACHOL) 40 MG tablet TAKE 1 TABLET BY MOUTH IN THE EVENING AFTER DINNER 90 tablet 0  . promethazine (PHENERGAN) 25 MG tablet Take 25 mg by mouth every 6 (six) hours as needed for nausea or vomiting.     . senna-docusate (SENOKOT-S) 8.6-50 MG tablet Take 1 tablet by mouth at bedtime.    . shark liver oil-cocoa butter (PREPARATION H) 0.25-3-85.5 % suppository Place 1 suppository rectally 2 (two) times daily as needed for hemorrhoids.    . torsemide (DEMADEX) 20 MG tablet Take 20 mg by mouth as needed.      No current facility-administered medications on file prior to visit.     Review of Systems  Constitution: Positive for malaise/fatigue. Negative for decreased appetite, weight gain and weight loss.  Eyes: Negative for visual disturbance.  Cardiovascular: Positive for chest pain, dyspnea on exertion and leg swelling. Negative for claudication, orthopnea, palpitations and syncope.  Respiratory: Negative for hemoptysis and wheezing.   Endocrine: Negative for cold intolerance and heat intolerance.  Hematologic/Lymphatic: Does not bruise/bleed easily.  Skin: Negative for nail changes.  Musculoskeletal: Negative for muscle weakness and myalgias.  Gastrointestinal: Negative for abdominal pain, change  in bowel habit, nausea and vomiting.  Neurological: Positive for dizziness and headaches. Negative for difficulty with concentration and focal weakness.  Psychiatric/Behavioral: Negative  for altered mental status and suicidal ideas.  All other systems reviewed and are negative.     Objective  Blood pressure (!) 144/106, pulse 72, height 5\' 10"  (1.778 m), weight 223 lb 6.4 oz (101.3 kg), SpO2 97 %. Body mass index is 32.05 kg/m.    Physical Exam  Constitutional: He is oriented to person, place, and time. Vital signs are normal. He appears well-developed and well-nourished.  Moderately obese  HENT:  Head: Normocephalic and atraumatic.  Neck: Normal range of motion. Neck supple.  Cardiovascular: Normal rate, regular rhythm, normal heart sounds and intact distal pulses.  Trace leg edema bilateral  Pulmonary/Chest: Effort normal and breath sounds normal. No accessory muscle usage. No respiratory distress.  Abdominal: Soft. Bowel sounds are normal.  Musculoskeletal: Normal range of motion.  Neurological: He is alert and oriented to person, place, and time.  Skin: Skin is warm and dry.  Vitals reviewed.  Radiology: No results found.  Laboratory examination:    CMP Latest Ref Rng & Units 03/21/2019 03/18/2019 11/28/2017  Glucose 70 - 99 mg/dL 110(H) 116(H) 96  BUN 6 - 20 mg/dL 13 15 10   Creatinine 0.61 - 1.24 mg/dL 0.92 0.93 0.85  Sodium 135 - 145 mmol/L 136 139 136  Potassium 3.5 - 5.1 mmol/L 3.6 4.3 3.3(L)  Chloride 98 - 111 mmol/L 103 102 103  CO2 22 - 32 mmol/L 24 30(H) 24  Calcium 8.9 - 10.3 mg/dL 8.3(L) 9.5 8.2(L)  Total Protein 6.5 - 8.1 g/dL - - -  Total Bilirubin 0.3 - 1.2 mg/dL - - -  Alkaline Phos 38 - 126 U/L - - -  AST 15 - 41 U/L - - -  ALT 17 - 63 U/L - - -   CBC Latest Ref Rng & Units 03/21/2019 03/18/2019 11/28/2017  WBC 4.0 - 10.5 K/uL 10.4 10.4 9.8  Hemoglobin 13.0 - 17.0 g/dL 16.5 17.2 13.6  Hematocrit 39.0 - 52.0 % 48.1 49.1 39.7  Platelets 150 - 400 K/uL 258  285 242   Lipid Panel     Component Value Date/Time   CHOL 198 11/27/2017 0634   TRIG 78 11/27/2017 0634   HDL 40 (L) 11/27/2017 0634   CHOLHDL 5.0 11/27/2017 0634   VLDL 16 11/27/2017 0634   LDLCALC 142 (H) 11/27/2017 0634   HEMOGLOBIN A1C Lab Results  Component Value Date   HGBA1C 6.0 (H) 11/27/2017   MPG 125.5 11/27/2017   TSH No results for input(s): TSH in the last 8760 hours.  Cardiac Studies:   Coronary angiogram 03/21/2019:  No significant change in coronary anatomy since number of 2019.  Proximal LAD had diffusely diseased 60% stenosis, FFR 0.85, DFR 0.92.  Lesion not significant. Circumflex is dominant.  There was in-stent restenosis followed by tandem 90% stenosis in the PDA branch of circumflex (marked as OM 2 on the figure) S/P 2.5 x 24 mm Synergy DES, stenosis reduced to 0%.  TIMI-3 to TIMI-3 flow.  Mid circumflex stent widely patent. Nondominant RCA in the proximal segment has a 70 to 80% stenosis.  It gives origin to a very small PDA but in general appears to be nondominant.  Coronary angiogram 09/21/2018: Circumflex stent placed 11/27/2017, 4.5 x 18 mm resolute widely patent, patent 3.5 x 18 mm Xience DES to dominant distal left circumflex, 2.5 x 12 mm Xience to OM1 placed 05/30/2013. Ostial to proximal LAD 60% stenosis, FFR 0.81 and GFR 0.90, not physiologically significant. Mildly elevated LVEDP.  Event Monitor 30 days 07/28/2018: Paroxysmal  episodes of atrial fibrillation with controlled ventricular response was noted along with PVCs. Some of this symptoms of chest pain, palpitations and dizziness correlated with atrial fibrillation, others showed normal sinus rhythm. No heart block, no other significant arrhythmias.  Echo 11/27/2017: Normal LVEF. Mild LVH. Normal diastolic parameters. Mild LA enlargement.  Direct current cardioversion ( Ist 05/27/16) 06/11/2016: 150x2, 200 x1 to NSR on Sotalol.  A. Fib ablation by Dr. [name] on 11/13/2016  Assessment    Atherosclerosis of native coronary artery of native heart with stable angina pectoris (HCC)  Chronic diastolic CHF (congestive heart failure) ([place]) - Plan: EKG 12-Lead  Other persistent atrial fibrillation - CHA2DS2-VASc Score is 4, Yearly risk of stroke 4% - Plan: EKG 12-Lead  Essential hypertension - Plan: cloNIDine (CATAPRES-TTS-2) 0.2 mg/24hr patch  Reactive depression - Plan: escitalopram (LEXAPRO) 10 MG tablet  Somatoform disorder  EKG 03/31/2019: Atrial fibrillation controlled ventricular response at the rate of 70 bpm, normal axis.  Nonspecific T abnormality.  Normal QT interval. No significant change from  EKG 03/17/2019  Recommendations:   Due to recurrent chest pain which is nitrate responsive, underwent angiography on 03/22/2019 and was found to have high-grade stenosis in the distal dominant RCA PDA branch for which he underwent successful stenting.  He also had FFR to the proximal and mid LAD which was not hemodynamically significant for intermediate stenosis.  Today he has multiple complaints which I am unable to sort out, continues to complain of fatigue, flushing, not feeling well, giving out easily, his wife is on the phone also complaining about multiple somatic complaints that he has an extremely concerned wife although she appears to feeling to his symptoms.  EKG does not reveal any ischemic changes, remains in atrial fibrillation.  Not sure that atrial fibrillation is contributing to any of his symptoms.  In spite of stating no appetite, does not eat much, no change in his weight and blood pressure continues to be extremely high.  We discussed that he may probably also have fibromyalgia like syndrome, could potentially try Lyrica but would defer this to his PCP.  However I have taken the liberty of starting him on Lexapro 10 mg daily.  He probably will need 15-20 mg.  With regard to his hypertension I started him on Catapres 0.2 mg patch.  I'd like to see him back in 4  weeks for follow-up.  I suspect his symptoms are suggestive of somatoform disorder.   He does have hyperlipidemia and in the mixture of multiple somatic complaints this is been overlooked and would like to discuss this on his next visit.  This was a 40 minute encounter with greater than 50% of the time spent on face-to-face encounter with the patient in office and his wife on the telephone.  [name], MD, [facility] 03/31/2019, 9:12 PM [facility] [place] Pager: [phone] Office: [phone] If no answer Cell [phone]

## 2019-04-04 ENCOUNTER — Ambulatory Visit: Payer: BLUE CROSS/BLUE SHIELD | Admitting: Cardiology

## 2019-05-02 ENCOUNTER — Ambulatory Visit: Payer: BLUE CROSS/BLUE SHIELD | Admitting: Cardiology

## 2019-05-03 ENCOUNTER — Encounter: Payer: Self-pay | Admitting: Cardiology

## 2019-05-03 ENCOUNTER — Ambulatory Visit: Payer: BLUE CROSS/BLUE SHIELD | Admitting: Cardiology

## 2019-05-03 ENCOUNTER — Other Ambulatory Visit: Payer: Self-pay

## 2019-05-03 VITALS — BP 112/82 | HR 69 | Ht 70.0 in | Wt 220.0 lb

## 2019-05-03 DIAGNOSIS — I5032 Chronic diastolic (congestive) heart failure: Secondary | ICD-10-CM

## 2019-05-03 DIAGNOSIS — I10 Essential (primary) hypertension: Secondary | ICD-10-CM | POA: Diagnosis not present

## 2019-05-03 DIAGNOSIS — I25118 Atherosclerotic heart disease of native coronary artery with other forms of angina pectoris: Secondary | ICD-10-CM | POA: Diagnosis not present

## 2019-05-03 DIAGNOSIS — I4819 Other persistent atrial fibrillation: Secondary | ICD-10-CM

## 2019-05-03 NOTE — Progress Notes (Signed)
Virtual Visit via Video Note: This visit type was conducted due to national recommendations for restrictions regarding the COVID-19 Pandemic (e.g. social distancing).  This format is felt to be most appropriate for this patient at this time.  All issues noted in this document were discussed and addressed.  No physical exam was performed (except for noted visual exam findings with Telehealth visits).  The patient has consented to conduct a Telehealth visit and understands insurance will be billed.   I connected with@, on 05/03/19 at  by a video enabled telemedicine application and verified that I am speaking with the correct person using two identifiers.   I discussed the limitations of evaluation and management by telemedicine and the availability of in person appointments. The patient expressed understanding and agreed to proceed.   I have discussed with patient regarding the safety during COVID Pandemic and steps and precautions to be taken including social distancing, frequent hand wash and use of detergent soap, gels with the patient. I asked the patient to avoid touching mouth, nose, eyes, ears with the hands. I encouraged regular walking around the neighborhood and exercise and regular diet, as long as social distancing can be maintained.  Primary Physician/Referring:  [name], NP  Patient ID: [name], male    DOB: 1966/05/25, 53 y.o.   MRN: [id number]  Chief Complaint  Patient presents with  . Chest Pain  . Hypertension  . Follow-up    HPI: ALMER BUSHEY  is a 53 y.o. male  with CAD, unstable angina pectoris on 11/26/2017, angioplasty to superdominant left circumflex coronary artery with 4.5 mm resolute DES in the midsegment, patent stent placed in Distal Cx and OM 1 in 2014, 03/22/2019, stenting to high-grade stenosis in the distal dominant Circumflex PDA branch.  He has hypertension, hyperlipidemia, OSA on CPAP and compliant, tobacco use, quit in March 2017, h/o TIA in Feb  2018 while on anticoagulants, paroxysmal Atrial fib S/P ablation 11/13/2016 by Dr. Rayann Heman.   He had maintained sinus on Multaq. He does not tolerate Tikosyn or sotalol due to prolongation in QT, Now he is on rate control strategy as he appears to be asymptomatic with A. Fib.  He has now been diagnosed as having seizures and was started on [facility] 07/27/18. Also apart from seizures In the past he was also evaluate for recurrent syncope which neurology evaluation was unyielding and felt to be cardiogenic but I felt it was probably psychogenic.  On his last office visit a month ago, I started him on clonidine patch which he is tolerating.  Since then he has noticed marked improvement in dyspnea, states that even his leg edema is improved, also his headaches have essentially resolved.  He still has occasional palpitations from A. fib.  Past Medical History:  Diagnosis Date  . Arthritis    "a little in my right knee" (07/14/2016)  . CAD (coronary artery disease)   . Chronic congestive heart failure with left ventricular diastolic dysfunction ([place])   . Complication of anesthesia    Pt reports "they have a hard time waking me up"  . GERD (gastroesophageal reflux disease)    hx  . Headache    "with every heart issue that I have" (07/14/2016)  . High cholesterol   . History of hiatal hernia 1990s   "fixed it w/scope down my throat"  . Hypertension   . Myocardial infarction [facility]) 05/2013   stents: left PDA, 1st OM at [facility]  . Obesity   .  Obstructive sleep apnea    previous test "inconclusive" per patient,  has appointment with [facility] 10/06/16 for evaluation  . Persistent atrial fibrillation   . Pneumonia ~ 1992  . QT prolongation 07/15/2016  . Seasonal allergies    "I take Allegra prn" (07/14/2016)  . Tobacco abuse 12/16/2016    Past Surgical History:  Procedure Laterality Date  . APPENDECTOMY  1984  . CARDIOVERSION N/A 05/27/2016   Procedure: CARDIOVERSION;   Surgeon: [name], MD;  [facility] ENDOSCOPY;  Service: Cardiovascular;  Laterality: N/A;  . CARDIOVERSION N/A 06/11/2016   Procedure: CARDIOVERSION;  Surgeon: [name], MD;  Location: [place];  Service: Cardiovascular;  Laterality: N/A;  . CORONARY ANGIOPLASTY WITH STENT PLACEMENT  05/30/2013   "2 stents put in at [facility]" & /stent card  . CORONARY STENT INTERVENTION N/A 11/27/2017   Procedure: CORONARY STENT INTERVENTION;  Surgeon: [name], MD;  Location: [place] CV LAB;  Service: Cardiovascular;  Laterality: N/A;  . CORONARY STENT INTERVENTION N/A 03/21/2019   Procedure: CORONARY STENT INTERVENTION;  Surgeon: [name], MD;  Location: [place] CV LAB;  Service: Cardiovascular;  Laterality: N/A;  . ELECTROPHYSIOLOGIC STUDY N/A 11/13/2016   Procedure: Atrial Fibrillation Ablation;  Surgeon: [name], MD;  Location: [place] CV LAB;  Service: Cardiovascular;  Laterality: N/A;  . ESOPHAGOGASTRODUODENOSCOPY (EGD) WITH ESOPHAGEAL DILATION  1990s X 2  . INTRAVASCULAR PRESSURE WIRE/FFR STUDY N/A 09/21/2018   Procedure: INTRAVASCULAR PRESSURE WIRE/FFR STUDY;  Surgeon: [name], MD;  Location: [place] CV LAB;  Service: Cardiovascular;  Laterality: N/A;  . INTRAVASCULAR PRESSURE WIRE/FFR STUDY N/A 03/21/2019   Procedure: INTRAVASCULAR PRESSURE WIRE/FFR STUDY;  Surgeon: [name], MD;  Location: [place] CV LAB;  Service: Cardiovascular;  Laterality: N/A;  . KNEE ARTHROSCOPY Right 1986; ~ 1995 X 2  . LAPAROSCOPIC CHOLECYSTECTOMY  2015  . LEFT HEART CATH AND CORONARY ANGIOGRAPHY N/A 11/27/2017   Procedure: LEFT HEART CATH AND CORONARY ANGIOGRAPHY;  Surgeon: [name], MD;  Location: [place] CV LAB;  Service: Cardiovascular;  Laterality: N/A;  . LEFT HEART CATH AND CORONARY ANGIOGRAPHY N/A 09/21/2018   Procedure: LEFT HEART CATH AND CORONARY ANGIOGRAPHY;  Surgeon: [name], MD;  Location: [place] CV LAB;  Service: Cardiovascular;  Laterality: N/A;  . LEFT HEART  CATH AND CORONARY ANGIOGRAPHY N/A 03/21/2019   Procedure: LEFT HEART CATH AND CORONARY ANGIOGRAPHY;  Surgeon: [name], MD;  Location: [place] CV LAB;  Service: Cardiovascular;  Laterality: N/A;  . REPAIR OF ESOPHAGUS  1998   perforation of the distal esophagus from bad heartburn  . TEE WITHOUT CARDIOVERSION N/A 11/13/2016   Procedure: TRANSESOPHAGEAL ECHOCARDIOGRAM (TEE);  Surgeon: Thayer Headings, MD;  Location: [facility] ENDOSCOPY;  Service: Cardiovascular;  Laterality: N/A;    Social History   Socioeconomic History  . Marital status: Married    Spouse name: Not on file  . Number of children: 2  . Years of education: Not on file  . Highest education level: Not on file  Occupational History  . Not on file  Social Needs  . Financial resource strain: Not on file  . Food insecurity    Worry: Not on file    Inability: Not on file  . Transportation needs    Medical: Not on file    Non-medical: Not on file  Tobacco Use  . Smoking status: Current Some Day Smoker    Years: 25.00    Types: Cigarettes, E-cigarettes    Last attempt to  quit: 12/17/2016    Years since quitting: 2.3  . Smokeless tobacco: Never Used  . Tobacco comment: 10 cigs per day  Substance and Sexual Activity  . Alcohol use: No  . Drug use: No  . Sexual activity: Yes  Lifestyle  . Physical activity    Days per week: Not on file    Minutes per session: Not on file  . Stress: Not on file  Relationships  . Social Herbalist on phone: Not on file    Gets together: Not on file    Attends religious service: Not on file    Active member of club or organization: Not on file    Attends meetings of clubs or organizations: Not on file    Relationship status: Not on file  . Intimate partner violence    Fear of current or ex partner: Not on file    Emotionally abused: Not on file    Physically abused: Not on file    Forced sexual activity: Not on file  Other Topics Concern  . Not on file  Social History  Narrative   Pt lives in [place] with spouse and 73 year old son.   Works Nurse, adult for [employer].    Current Outpatient Medications on File Prior to Visit  Medication Sig Dispense Refill  . amLODipine (NORVASC) 10 MG tablet Take 10 mg by mouth daily.    Marland Kitchen apixaban (ELIQUIS) 5 MG TABS tablet Take 1 tablet (5 mg total) by mouth 2 (two) times daily. 180 tablet 3  . bisacodyl (DULCOLAX) 5 MG EC tablet Take 5 mg by mouth at bedtime.    . cloNIDine (CATAPRES-TTS-2) 0.2 mg/24hr patch Place 1 patch (0.2 mg total) onto the skin once a week. 4 patch 2  . docusate sodium (PX DOCUSATE SODIUM) 100 MG capsule Take 100 mg by mouth 2 (two) times daily.    Marland Kitchen ezetimibe (ZETIA) 10 MG tablet TAKE 1 TABLET BY MOUTH ONCE DAILY 90 tablet 0  . fexofenadine (ALLEGRA) 180 MG tablet Take 180 mg by mouth daily as needed (seasonal allergies).     . Nebivolol HCl (BYSTOLIC) 20 MG TABS Take 20 mg by mouth at bedtime.    . nitroGLYCERIN (NITROSTAT) 0.4 MG SL tablet Place 1 tablet (0.4 mg total) under the tongue every 5 (five) minutes x 3 doses as needed for chest pain. 25 tablet 2  . olmesartan-hydrochlorothiazide (BENICAR HCT) 20-12.5 MG tablet Take 1 tablet by mouth daily.    . ondansetron (ZOFRAN) 4 MG tablet Take 4 mg by mouth 2 (two) times daily as needed for nausea/vomiting.  2  . pravastatin (PRAVACHOL) 40 MG tablet TAKE 1 TABLET BY MOUTH IN THE EVENING AFTER DINNER 90 tablet 0  . promethazine (PHENERGAN) 25 MG tablet Take 25 mg by mouth every 6 (six) hours as needed for nausea or vomiting.     . senna-docusate (SENOKOT-S) 8.6-50 MG tablet Take 1 tablet by mouth at bedtime.    . shark liver oil-cocoa butter (PREPARATION H) 0.25-3-85.5 % suppository Place 1 suppository rectally 2 (two) times daily as needed for hemorrhoids.    . torsemide (DEMADEX) 20 MG tablet Take 20 mg by mouth as needed.     Marland Kitchen acetaminophen (TYLENOL) 500 MG tablet Take 500 mg by mouth 2 (two) times daily as needed for moderate pain.      No current facility-administered medications on file prior to visit.     Review of Systems  Constitution: Positive  for malaise/fatigue. Negative for decreased appetite, weight gain and weight loss.  Eyes: Negative for visual disturbance.  Cardiovascular: Positive for dyspnea on exertion and leg swelling. Negative for orthopnea, palpitations and syncope.  Respiratory: Negative for hemoptysis and wheezing.   Endocrine: Negative for cold intolerance and heat intolerance.  Hematologic/Lymphatic: Does not bruise/bleed easily.  Skin: Negative for nail changes.  Musculoskeletal: Negative for muscle weakness and myalgias.  Gastrointestinal: Negative for abdominal pain, change in bowel habit, nausea and vomiting.  Neurological: Positive for dizziness. Negative for difficulty with concentration, focal weakness and headaches.  Psychiatric/Behavioral: Negative for altered mental status and suicidal ideas.  All other systems reviewed and are negative.     Objective  Blood pressure 112/82, pulse 69, height 5\' 10"  (1.778 m), weight 220 lb (99.8 kg). Body mass index is 31.57 kg/m.   Physical exam not performed or limited due to virtual visit.  Patient appeared to be in no distress, Neck was supple, respiration was not labored.  Please see exam details from prior visit is as below.  Physical Exam  Constitutional: He is oriented to person, place, and time. Vital signs are normal. He appears well-developed and well-nourished.  Moderately obese  HENT:  Head: Normocephalic and atraumatic.  Neck: Normal range of motion. Neck supple.  Cardiovascular: Normal rate, regular rhythm, normal heart sounds and intact distal pulses.  Trace leg edema bilateral  Pulmonary/Chest: Effort normal and breath sounds normal. No accessory muscle usage. No respiratory distress.  Abdominal: Soft. Bowel sounds are normal.  Musculoskeletal: Normal range of motion.  Neurological: He is alert and oriented to person, place,  and time.  Skin: Skin is warm and dry.  Vitals reviewed.  Radiology: No results found.  Laboratory examination:    CMP Latest Ref Rng & Units 03/21/2019 03/18/2019 11/28/2017  Glucose 70 - 99 mg/dL 110(H) 116(H) 96  BUN 6 - 20 mg/dL 13 15 10   Creatinine 0.61 - 1.24 mg/dL 0.92 0.93 0.85  Sodium 135 - 145 mmol/L 136 139 136  Potassium 3.5 - 5.1 mmol/L 3.6 4.3 3.3(L)  Chloride 98 - 111 mmol/L 103 102 103  CO2 22 - 32 mmol/L 24 30(H) 24  Calcium 8.9 - 10.3 mg/dL 8.3(L) 9.5 8.2(L)  Total Protein 6.5 - 8.1 g/dL - - -  Total Bilirubin 0.3 - 1.2 mg/dL - - -  Alkaline Phos 38 - 126 U/L - - -  AST 15 - 41 U/L - - -  ALT 17 - 63 U/L - - -   CBC Latest Ref Rng & Units 03/21/2019 03/18/2019 11/28/2017  WBC 4.0 - 10.5 K/uL 10.4 10.4 9.8  Hemoglobin 13.0 - 17.0 g/dL 16.5 17.2 13.6  Hematocrit 39.0 - 52.0 % 48.1 49.1 39.7  Platelets 150 - 400 K/uL 258 285 242   Lipid Panel     Component Value Date/Time   CHOL 198 11/27/2017 0634   TRIG 78 11/27/2017 0634   HDL 40 (L) 11/27/2017 0634   CHOLHDL 5.0 11/27/2017 0634   VLDL 16 11/27/2017 0634   LDLCALC 142 (H) 11/27/2017 0634   HEMOGLOBIN A1C Lab Results  Component Value Date   HGBA1C 6.0 (H) 11/27/2017   MPG 125.5 11/27/2017   TSH No results for input(s): TSH in the last 8760 hours.  Cardiac Studies:   Coronary angiogram 03/21/2019:  No significant change in coronary anatomy since number of 2019.  Proximal LAD had diffusely diseased 60% stenosis, FFR 0.85, DFR 0.92.  Lesion not significant. Circumflex is dominant.  There was  in-stent restenosis followed by tandem 90% stenosis in the PDA branch of circumflex (marked as OM 2 on the figure) S/P 2.5 x 24 mm Synergy DES, stenosis reduced to 0%.  TIMI-3 to TIMI-3 flow.  Mid circumflex stent widely patent. Nondominant RCA in the proximal segment has a 70 to 80% stenosis.  It gives origin to a very small PDA but in general appears to be nondominant.  Coronary angiogram 09/21/2018: Circumflex  stent placed 11/27/2017, 4.5 x 18 mm resolute widely patent, patent 3.5 x 18 mm Xience DES to dominant distal left circumflex, 2.5 x 12 mm Xience to OM1 placed 05/30/2013. Ostial to proximal LAD 60% stenosis, FFR 0.81 and GFR 0.90, not physiologically significant. Mildly elevated LVEDP.  Event Monitor 30 days 07/28/2018: Paroxysmal episodes of atrial fibrillation with controlled ventricular response was noted along with PVCs. Some of this symptoms of chest pain, palpitations and dizziness correlated with atrial fibrillation, others showed normal sinus rhythm. No heart block, no other significant arrhythmias.  Echo 11/27/2017: Normal LVEF. Mild LVH. Normal diastolic parameters. Mild LA enlargement.  Direct current cardioversion ( Ist 05/27/16) 06/11/2016: 150x2, 200 x1 to NSR on Sotalol. A. Fib ablation by Dr. [name] on 11/13/2016  Assessment   1. Atherosclerosis of native coronary artery of native heart with stable angina pectoris ([place])   2. Essential hypertension   3. Chronic diastolic CHF (congestive heart failure) ([facility])   4. Other persistent atrial fibrillation    EKG 03/31/2019: Atrial fibrillation controlled ventricular response at the rate of 70 bpm, normal axis.  Nonspecific T abnormality.  Normal QT interval. No significant change from  EKG 03/17/2019  Recommendations:   Patient with known coronary artery disease and chronic chest pain syndrome and multiple somatic complaints, on his last office visit I started him on clonidine patch.  Since then his blood pressures not well controlled, symptoms of dyspnea has improved and palpitation symptoms are also improved.  He was complaining about headaches previously which is now essentially resolved.  Advised him to continue present medical therapy, I will see him back in 6 months or sooner if problems.   [name], MD, [facility] 05/03/2019, 2:15 PM [facility] [place] Pager: [phone] Office: [phone] If no answer Cell  [phone]

## 2019-05-05 ENCOUNTER — Other Ambulatory Visit: Payer: Self-pay | Admitting: Cardiology

## 2019-05-05 DIAGNOSIS — I10 Essential (primary) hypertension: Secondary | ICD-10-CM

## 2019-05-05 MED ORDER — BYSTOLIC 20 MG PO TABS: 1.0000 | ORAL_TABLET | Freq: Every day | ORAL | 1 refills | Status: DC

## 2019-05-05 MED ORDER — AMLODIPINE BESYLATE 10 MG PO TABS: 10.0000 mg | ORAL_TABLET | Freq: Every day | ORAL | 1 refills | Status: DC

## 2019-05-05 NOTE — Telephone Encounter (Signed)
Please fill

## 2019-06-20 ENCOUNTER — Other Ambulatory Visit: Payer: Self-pay | Admitting: Cardiology

## 2019-06-20 ENCOUNTER — Other Ambulatory Visit: Payer: Self-pay

## 2019-06-20 DIAGNOSIS — I10 Essential (primary) hypertension: Secondary | ICD-10-CM

## 2019-06-20 MED ORDER — PRAVASTATIN SODIUM 40 MG PO TABS: ORAL_TABLET | ORAL | 10 refills | Status: DC

## 2019-06-20 MED ORDER — CLONIDINE 0.2 MG/24HR TD PTWK: 0.2000 mg | MEDICATED_PATCH | TRANSDERMAL | 3 refills | Status: DC

## 2019-07-15 ENCOUNTER — Telehealth: Payer: BLUE CROSS/BLUE SHIELD

## 2019-07-15 DIAGNOSIS — I5032 Chronic diastolic (congestive) heart failure: Secondary | ICD-10-CM | POA: Diagnosis not present

## 2019-07-15 DIAGNOSIS — I4819 Other persistent atrial fibrillation: Secondary | ICD-10-CM

## 2019-07-15 DIAGNOSIS — I25118 Atherosclerotic heart disease of native coronary artery with other forms of angina pectoris: Secondary | ICD-10-CM

## 2019-07-15 NOTE — Telephone Encounter (Signed)
Patient had an episode of chest pain this morning and had to take 2 sublingual nitroglycerin with relief of chest pain.  Patient's daughter and wife had called and left message about call back.  Patient is presently doing well and has not had any recurrence of chest pain.  He has noticed worsening dyspnea over the past few days.  No leg edema, weight has not significantly changed.  Advised him that episodes of angina are fairly frequent and can happen and as long as it is easily relieved with nitroglycerin he should continue the same.  He denied palpitations, PND or orthopnea.  With regard to his dyspnea, he is probably developing acute on chronic diastolic heart failure.  He does have furosemide at home and advised him to use it over the weekend for 2 days and to contact us if his symptoms are not improved.  This was a 10-minute telephone encounter.

## 2019-07-15 NOTE — Telephone Encounter (Signed)
Pt daughter called to inform us that pt has had chest pain since this morning from 5 or 6. Pt daughter mention he has taken 2 nitro in the morning and still feeing the same way. Pt is having palps for 170 to 175 and dizziness. Does not have fever, sweat, headaches or any pass out. Please advice thank you.

## 2019-07-18 ENCOUNTER — Telehealth: Payer: Self-pay

## 2019-07-18 NOTE — Telephone Encounter (Signed)
Pt's spouse called stating that pt has still been having cp and has been taking at least 2 nitro per night. He took the furosemide over the weekend as directed by [employer]. Verbally discussed with JG and he wants to see pt. Spouse aware and call transferred to FE to schedule.//ah

## 2019-07-20 ENCOUNTER — Other Ambulatory Visit: Payer: Self-pay

## 2019-07-20 ENCOUNTER — Ambulatory Visit: Payer: BLUE CROSS/BLUE SHIELD | Admitting: Cardiology

## 2019-07-20 NOTE — Progress Notes (Deleted)
Primary Physician/Referring:  [name], NP  Patient ID: [name], male    DOB: 1966/01/14, 53 y.o.   MRN: [id number]  No chief complaint on file.   HPI: CHUNG SUAREZ  is a 53 y.o. male  with CAD, unstable angina pectoris on 11/26/2017, angioplasty to superdominant left circumflex coronary artery with 4.5 mm resolute DES in the midsegment, patent stent placed in Distal Cx and OM 1 in 2014, 03/22/2019, stenting to high-grade stenosis in the distal dominant Circumflex PDA branch.  He has hypertension, hyperlipidemia, OSA on CPAP and compliant, tobacco use, quit in March 2017, h/o TIA in Feb 2018 while on anticoagulants, paroxysmal Atrial fib S/P ablation 11/13/2016 by Dr. Rayann Heman.   He had maintained sinus on Multaq. He does not tolerate Tikosyn or sotalol due to prolongation in QT, Now he is on rate control strategy as he appears to be asymptomatic with A. Fib.  He has now been diagnosed as having seizures and was started on [facility] 07/27/18. Also apart from seizures In the past he was also evaluate for recurrent syncope which neurology evaluation was unyielding and felt to be cardiogenic but I felt it was probably psychogenic.  On his last office visit a month ago, I started him on clonidine patch which he is tolerating.  Since then he has noticed marked improvement in dyspnea, states that even his leg edema is improved, also his headaches have essentially resolved.  He still has occasional palpitations from A. fib.  Past Medical History:  Diagnosis Date  . Arthritis    "a little in my right knee" (07/14/2016)  . CAD (coronary artery disease)   . Chronic congestive heart failure with left ventricular diastolic dysfunction ([place])   . Complication of anesthesia    Pt reports "they have a hard time waking me up"  . GERD (gastroesophageal reflux disease)    hx  . Headache    "with every heart issue that I have" (07/14/2016)  . High cholesterol   . History of hiatal hernia  1990s   "fixed it w/scope down my throat"  . Hypertension   . Myocardial infarction [facility]) 05/2013   stents: left PDA, 1st OM at [facility]  . Obesity   . Obstructive sleep apnea    previous test "inconclusive" per patient,  has appointment with [facility] 10/06/16 for evaluation  . Persistent atrial fibrillation   . Pneumonia ~ 1992  . QT prolongation 07/15/2016  . Seasonal allergies    "I take Allegra prn" (07/14/2016)  . Tobacco abuse 12/16/2016    Past Surgical History:  Procedure Laterality Date  . APPENDECTOMY  1984  . CARDIOVERSION N/A 05/27/2016   Procedure: CARDIOVERSION;  Surgeon: [name], MD;  Location: [facility] ENDOSCOPY;  Service: Cardiovascular;  Laterality: N/A;  . CARDIOVERSION N/A 06/11/2016   Procedure: CARDIOVERSION;  Surgeon: [name], MD;  Location: [place];  Service: Cardiovascular;  Laterality: N/A;  . CORONARY ANGIOPLASTY WITH STENT PLACEMENT  05/30/2013   "2 stents put in at [facility]" & /stent card  . CORONARY STENT INTERVENTION N/A 11/27/2017   Procedure: CORONARY STENT INTERVENTION;  Surgeon: [name], MD;  Location: [place] CV LAB;  Service: Cardiovascular;  Laterality: N/A;  . CORONARY STENT INTERVENTION N/A 03/21/2019   Procedure: CORONARY STENT INTERVENTION;  Surgeon: [name], MD;  Location: [place] CV LAB;  Service: Cardiovascular;  Laterality: N/A;  . ELECTROPHYSIOLOGIC STUDY N/A 11/13/2016   Procedure: Atrial Fibrillation Ablation;  Surgeon: [name],  MD;  Location: [place] CV LAB;  Service: Cardiovascular;  Laterality: N/A;  . ESOPHAGOGASTRODUODENOSCOPY (EGD) WITH ESOPHAGEAL DILATION  1990s X 2  . INTRAVASCULAR PRESSURE WIRE/FFR STUDY N/A 09/21/2018   Procedure: INTRAVASCULAR PRESSURE WIRE/FFR STUDY;  Surgeon: [name], MD;  Location: [place] CV LAB;  Service: Cardiovascular;  Laterality: N/A;  . INTRAVASCULAR PRESSURE WIRE/FFR STUDY N/A 03/21/2019   Procedure: INTRAVASCULAR PRESSURE WIRE/FFR STUDY;  Surgeon:  [name], MD;  Location: [place] CV LAB;  Service: Cardiovascular;  Laterality: N/A;  . KNEE ARTHROSCOPY Right 1986; ~ 1995 X 2  . LAPAROSCOPIC CHOLECYSTECTOMY  2015  . LEFT HEART CATH AND CORONARY ANGIOGRAPHY N/A 11/27/2017   Procedure: LEFT HEART CATH AND CORONARY ANGIOGRAPHY;  Surgeon: [name], MD;  Location: [place] CV LAB;  Service: Cardiovascular;  Laterality: N/A;  . LEFT HEART CATH AND CORONARY ANGIOGRAPHY N/A 09/21/2018   Procedure: LEFT HEART CATH AND CORONARY ANGIOGRAPHY;  Surgeon: [name], MD;  Location: [place] CV LAB;  Service: Cardiovascular;  Laterality: N/A;  . LEFT HEART CATH AND CORONARY ANGIOGRAPHY N/A 03/21/2019   Procedure: LEFT HEART CATH AND CORONARY ANGIOGRAPHY;  Surgeon: [name], MD;  Location: [place] CV LAB;  Service: Cardiovascular;  Laterality: N/A;  . REPAIR OF ESOPHAGUS  1998   perforation of the distal esophagus from bad heartburn  . TEE WITHOUT CARDIOVERSION N/A 11/13/2016   Procedure: TRANSESOPHAGEAL ECHOCARDIOGRAM (TEE);  Surgeon: Thayer Headings, MD;  Location: [facility] ENDOSCOPY;  Service: Cardiovascular;  Laterality: N/A;    Social History   Socioeconomic History  . Marital status: Married    Spouse name: Not on file  . Number of children: 2  . Years of education: Not on file  . Highest education level: Not on file  Occupational History  . Not on file  Social Needs  . Financial resource strain: Not on file  . Food insecurity    Worry: Not on file    Inability: Not on file  . Transportation needs    Medical: Not on file    Non-medical: Not on file  Tobacco Use  . Smoking status: Current Some Day Smoker    Years: 25.00    Types: Cigarettes, E-cigarettes    Last attempt to quit: 12/17/2016    Years since quitting: 2.5  . Smokeless tobacco: Never Used  . Tobacco comment: 10 cigs per day  Substance and Sexual Activity  . Alcohol use: No  . Drug use: No  . Sexual activity: Yes  Lifestyle  . Physical activity    Days per  week: Not on file    Minutes per session: Not on file  . Stress: Not on file  Relationships  . Social Herbalist on phone: Not on file    Gets together: Not on file    Attends religious service: Not on file    Active member of club or organization: Not on file    Attends meetings of clubs or organizations: Not on file    Relationship status: Not on file  . Intimate partner violence    Fear of current or ex partner: Not on file    Emotionally abused: Not on file    Physically abused: Not on file    Forced sexual activity: Not on file  Other Topics Concern  . Not on file  Social History Narrative   Pt lives in [place] with spouse and 64 year old son.   Works Nurse, adult for [employer].  Current Outpatient Medications on File Prior to Visit  Medication Sig Dispense Refill  . acetaminophen (TYLENOL) 500 MG tablet Take 500 mg by mouth 2 (two) times daily as needed for moderate pain.    Marland Kitchen amLODipine (NORVASC) 10 MG tablet Take 1 tablet (10 mg total) by mouth daily. 90 tablet 1  . apixaban (ELIQUIS) 5 MG TABS tablet Take 1 tablet (5 mg total) by mouth 2 (two) times daily. 180 tablet 3  . bisacodyl (DULCOLAX) 5 MG EC tablet Take 5 mg by mouth at bedtime.    . cloNIDine (CATAPRES - DOSED IN MG/24 HR) 0.2 mg/24hr patch Place 1 patch (0.2 mg total) onto the skin once a week. 12 patch 3  . docusate sodium (PX DOCUSATE SODIUM) 100 MG capsule Take 100 mg by mouth 2 (two) times daily.    Marland Kitchen ezetimibe (ZETIA) 10 MG tablet TAKE 1 TABLET BY MOUTH ONCE DAILY 90 tablet 0  . fexofenadine (ALLEGRA) 180 MG tablet Take 180 mg by mouth daily as needed (seasonal allergies).     . Nebivolol HCl (BYSTOLIC) 20 MG TABS Take 1 tablet (20 mg total) by mouth daily. 90 tablet 1  . nitroGLYCERIN (NITROSTAT) 0.4 MG SL tablet Place 1 tablet (0.4 mg total) under the tongue every 5 (five) minutes x 3 doses as needed for chest pain. 25 tablet 2  . olmesartan-hydrochlorothiazide (BENICAR HCT) 20-12.5  MG tablet Take 1 tablet by mouth daily.    . ondansetron (ZOFRAN) 4 MG tablet Take 4 mg by mouth 2 (two) times daily as needed for nausea/vomiting.  2  . pravastatin (PRAVACHOL) 40 MG tablet TAKE 1 TABLET BY MOUTH IN THE EVENING AFTER DINNER 30 tablet 10  . promethazine (PHENERGAN) 25 MG tablet Take 25 mg by mouth every 6 (six) hours as needed for nausea or vomiting.     . senna-docusate (SENOKOT-S) 8.6-50 MG tablet Take 1 tablet by mouth at bedtime.    . shark liver oil-cocoa butter (PREPARATION H) 0.25-3-85.5 % suppository Place 1 suppository rectally 2 (two) times daily as needed for hemorrhoids.    . torsemide (DEMADEX) 20 MG tablet Take 20 mg by mouth as needed.      No current facility-administered medications on file prior to visit.    Review of Systems  Constitution: Positive for malaise/fatigue. Negative for decreased appetite, weight gain and weight loss.  Eyes: Negative for visual disturbance.  Cardiovascular: Positive for dyspnea on exertion and leg swelling. Negative for orthopnea, palpitations and syncope.  Respiratory: Negative for hemoptysis and wheezing.   Endocrine: Negative for cold intolerance and heat intolerance.  Hematologic/Lymphatic: Does not bruise/bleed easily.  Skin: Negative for nail changes.  Musculoskeletal: Negative for muscle weakness and myalgias.  Gastrointestinal: Negative for abdominal pain, change in bowel habit, nausea and vomiting.  Neurological: Positive for dizziness. Negative for difficulty with concentration, focal weakness and headaches.  Psychiatric/Behavioral: Negative for altered mental status and suicidal ideas.  All other systems reviewed and are negative.     Objective  There were no vitals taken for this visit. There is no height or weight on file to calculate BMI.   Physical Exam  Constitutional: He is oriented to person, place, and time. Vital signs are normal. He appears well-developed and well-nourished.  Moderately obese  HENT:   Head: Normocephalic and atraumatic.  Neck: Normal range of motion. Neck supple.  Cardiovascular: Normal rate, regular rhythm, normal heart sounds and intact distal pulses.  Trace leg edema bilateral  Pulmonary/Chest: Effort normal and breath  sounds normal. No accessory muscle usage. No respiratory distress.  Abdominal: Soft. Bowel sounds are normal.  Musculoskeletal: Normal range of motion.  Neurological: He is alert and oriented to person, place, and time.  Skin: Skin is warm and dry.  Vitals reviewed.  Radiology: No results found.  Laboratory examination:    CMP Latest Ref Rng & Units 03/21/2019 03/18/2019 11/28/2017  Glucose 70 - 99 mg/dL 110(H) 116(H) 96  BUN 6 - 20 mg/dL 13 15 10   Creatinine 0.61 - 1.24 mg/dL 0.92 0.93 0.85  Sodium 135 - 145 mmol/L 136 139 136  Potassium 3.5 - 5.1 mmol/L 3.6 4.3 3.3(L)  Chloride 98 - 111 mmol/L 103 102 103  CO2 22 - 32 mmol/L 24 30(H) 24  Calcium 8.9 - 10.3 mg/dL 8.3(L) 9.5 8.2(L)  Total Protein 6.5 - 8.1 g/dL - - -  Total Bilirubin 0.3 - 1.2 mg/dL - - -  Alkaline Phos 38 - 126 U/L - - -  AST 15 - 41 U/L - - -  ALT 17 - 63 U/L - - -   CBC Latest Ref Rng & Units 03/21/2019 03/18/2019 11/28/2017  WBC 4.0 - 10.5 K/uL 10.4 10.4 9.8  Hemoglobin 13.0 - 17.0 g/dL 16.5 17.2 13.6  Hematocrit 39.0 - 52.0 % 48.1 49.1 39.7  Platelets 150 - 400 K/uL 258 285 242   Lipid Panel     Component Value Date/Time   CHOL 198 11/27/2017 0634   TRIG 78 11/27/2017 0634   HDL 40 (L) 11/27/2017 0634   CHOLHDL 5.0 11/27/2017 0634   VLDL 16 11/27/2017 0634   LDLCALC 142 (H) 11/27/2017 0634   HEMOGLOBIN A1C Lab Results  Component Value Date   HGBA1C 6.0 (H) 11/27/2017   MPG 125.5 11/27/2017   TSH No results for input(s): TSH in the last 8760 hours.  Cardiac Studies:   Coronary angiogram 03/21/2019:  No significant change in coronary anatomy since number of 2019.  Proximal LAD had diffusely diseased 60% stenosis, FFR 0.85, DFR 0.92.  Lesion not  significant. Circumflex is dominant.  There was in-stent restenosis followed by tandem 90% stenosis in the PDA branch of circumflex (marked as OM 2 on the figure) S/P 2.5 x 24 mm Synergy DES, stenosis reduced to 0%.  TIMI-3 to TIMI-3 flow.  Mid circumflex stent widely patent. Nondominant RCA in the proximal segment has a 70 to 80% stenosis.  It gives origin to a very small PDA but in general appears to be nondominant.  Coronary angiogram 09/21/2018: Circumflex stent placed 11/27/2017, 4.5 x 18 mm resolute widely patent, patent 3.5 x 18 mm Xience DES to dominant distal left circumflex, 2.5 x 12 mm Xience to OM1 placed 05/30/2013. Ostial to proximal LAD 60% stenosis, FFR 0.81 and GFR 0.90, not physiologically significant. Mildly elevated LVEDP.  Event Monitor 30 days 07/28/2018: Paroxysmal episodes of atrial fibrillation with controlled ventricular response was noted along with PVCs. Some of this symptoms of chest pain, palpitations and dizziness correlated with atrial fibrillation, others showed normal sinus rhythm. No heart block, no other significant arrhythmias.  Echo 11/27/2017: Normal LVEF. Mild LVH. Normal diastolic parameters. Mild LA enlargement.  Direct current cardioversion ( Ist 05/27/16) 06/11/2016: 150x2, 200 x1 to NSR on Sotalol. A. Fib ablation by Dr. [name] on 11/13/2016  Assessment   1. Chest pain syndrome   2. Coronary artery disease of native artery of native heart with stable angina pectoris ([place])   3. Chronic diastolic CHF (congestive heart failure) ([place])   4.  Other persistent atrial fibrillation   5. Somatoform disorder    EKG 03/31/2019: Atrial fibrillation controlled ventricular response at the rate of 70 bpm, normal axis.  Nonspecific T abnormality.  Normal QT interval. No significant change from  EKG 03/17/2019  Recommendations:   Patient with known coronary artery disease and chronic chest pain syndrome and multiple somatic complaints, on his last office visit I  started him on clonidine patch.  Since then his blood pressures not well controlled, symptoms of dyspnea has improved and palpitation symptoms are also improved.  He was complaining about headaches previously which is now essentially resolved.  Advised him to continue present medical therapy, I will see him back in 6 months or sooner if problems.   [name], MD, [facility] 07/20/2019, 11:54 AM [place] [facility] [facility]. [place] Pager: [phone] Office: [phone] If no answer Cell [phone]

## 2019-07-22 ENCOUNTER — Other Ambulatory Visit: Payer: Self-pay

## 2019-07-22 MED ORDER — NITROGLYCERIN 0.4 MG SL SUBL: 0.4000 mg | SUBLINGUAL_TABLET | SUBLINGUAL | 2 refills | Status: DC | PRN

## 2019-07-22 NOTE — Telephone Encounter (Signed)
FYI,. I sent in both prescriptions to the pharmacy.

## 2019-08-11 ENCOUNTER — Ambulatory Visit: Payer: BLUE CROSS/BLUE SHIELD | Admitting: Cardiology

## 2019-08-11 NOTE — Progress Notes (Deleted)
Primary Physician/Referring:  [name], NP  Patient ID: Danny Kelley, male    DOB: December 15, 1965, 53 y.o.   MRN: [id number]  No chief complaint on file.   HPI: Danny Kelley  is a 53 y.o. male  with CAD, unstable angina pectoris on 11/26/2017, angioplasty to superdominant left circumflex coronary artery with 4.5 mm resolute DES in the midsegment, patent stent placed in Distal Cx and OM 1 in 2014, 03/22/2019, stenting to high-grade stenosis in the distal dominant Circumflex PDA branch.  He has hypertension, hyperlipidemia, OSA on CPAP and compliant, tobacco use, quit in March 2017, h/o TIA in Feb 2018 while on anticoagulants, paroxysmal Atrial fib S/P ablation 11/13/2016 by Dr. Rayann Heman.   He had maintained sinus on Multaq. He does not tolerate Tikosyn or sotalol due to prolongation in QT, Now he is on rate control strategy as he appears to be asymptomatic with A. Fib.  He has now been diagnosed as having seizures and was started on [facility] 07/27/18. Also apart from seizures In the past he was also evaluate for recurrent syncope which neurology evaluation was unyielding and felt to be cardiogenic but I felt it was probably psychogenic.  On his last office visit a month ago, I started him on clonidine patch which he is tolerating.  Since then he has noticed marked improvement in dyspnea, states that even his leg edema is improved, also his headaches have essentially resolved.  He still has occasional palpitations from A. fib.  Past Medical History:  Diagnosis Date  . Arthritis    "a little in my right knee" (07/14/2016)  . CAD (coronary artery disease)   . Chronic congestive heart failure with left ventricular diastolic dysfunction ([place])   . Complication of anesthesia    Pt reports "they have a hard time waking me up"  . GERD (gastroesophageal reflux disease)    hx  . Headache    "with every heart issue that I have" (07/14/2016)  . High cholesterol   . History of hiatal hernia  1990s   "fixed it w/scope down my throat"  . Hypertension   . Myocardial infarction [facility]) 05/2013   stents: left PDA, 1st OM at [facility]  . Obesity   . Obstructive sleep apnea    previous test "inconclusive" per patient,  has appointment with [facility] 10/06/16 for evaluation  . Persistent atrial fibrillation   . Pneumonia ~ 1992  . QT prolongation 07/15/2016  . Seasonal allergies    "I take Allegra prn" (07/14/2016)  . Tobacco abuse 12/16/2016    Past Surgical History:  Procedure Laterality Date  . APPENDECTOMY  1984  . CARDIOVERSION N/A 05/27/2016   Procedure: CARDIOVERSION;  Surgeon: [name], MD;  Location: [facility] ENDOSCOPY;  Service: Cardiovascular;  Laterality: N/A;  . CARDIOVERSION N/A 06/11/2016   Procedure: CARDIOVERSION;  Surgeon: [name], MD;  Location: [place];  Service: Cardiovascular;  Laterality: N/A;  . CORONARY ANGIOPLASTY WITH STENT PLACEMENT  05/30/2013   "2 stents put in at [facility]" & /stent card  . CORONARY STENT INTERVENTION N/A 11/27/2017   Procedure: CORONARY STENT INTERVENTION;  Surgeon: [name], MD;  Location: [place] CV LAB;  Service: Cardiovascular;  Laterality: N/A;  . CORONARY STENT INTERVENTION N/A 03/21/2019   Procedure: CORONARY STENT INTERVENTION;  Surgeon: [name], MD;  Location: [place] [facility];  Service: Cardiovascular;  Laterality: N/A;  . ELECTROPHYSIOLOGIC STUDY N/A 11/13/2016   Procedure: Atrial Fibrillation Ablation;  Surgeon: [name],  MD;  Location: [place] [facility];  Service: Cardiovascular;  Laterality: N/A;  . ESOPHAGOGASTRODUODENOSCOPY (EGD) WITH ESOPHAGEAL DILATION  1990s X 2  . INTRAVASCULAR PRESSURE WIRE/FFR STUDY N/A 09/21/2018   Procedure: INTRAVASCULAR PRESSURE WIRE/FFR STUDY;  Surgeon: [name], MD;  Location: [place] [facility];  Service: Cardiovascular;  Laterality: N/A;  . INTRAVASCULAR PRESSURE WIRE/FFR STUDY N/A 03/21/2019   Procedure: INTRAVASCULAR PRESSURE WIRE/FFR STUDY;  Surgeon:  [name], MD;  Location: [place] [facility];  Service: Cardiovascular;  Laterality: N/A;  . KNEE ARTHROSCOPY Right 1986; ~ 1995 X 2  . LAPAROSCOPIC CHOLECYSTECTOMY  2015  . LEFT HEART CATH AND CORONARY ANGIOGRAPHY N/A 11/27/2017   Procedure: LEFT HEART CATH AND CORONARY ANGIOGRAPHY;  Surgeon: [name], MD;  Location: [place] [facility];  Service: Cardiovascular;  Laterality: N/A;  . LEFT HEART CATH AND CORONARY ANGIOGRAPHY N/A 09/21/2018   Procedure: LEFT HEART CATH AND CORONARY ANGIOGRAPHY;  Surgeon: [name], MD;  Location: [place] [facility];  Service: Cardiovascular;  Laterality: N/A;  . LEFT HEART CATH AND CORONARY ANGIOGRAPHY N/A 03/21/2019   Procedure: LEFT HEART CATH AND CORONARY ANGIOGRAPHY;  Surgeon: [name], MD;  Location: [place] [facility];  Service: Cardiovascular;  Laterality: N/A;  . REPAIR OF ESOPHAGUS  1998   perforation of the distal esophagus from bad heartburn  . TEE WITHOUT CARDIOVERSION N/A 11/13/2016   Procedure: TRANSESOPHAGEAL ECHOCARDIOGRAM (TEE);  Surgeon: Thayer Headings, MD;  Location[facility] ENDOSCOPY;  Service: Cardiovascular;  Laterality: N/A;    Social History   Socioeconomic History  . Marital status: Married    Spouse name: Not on file  . Number of children: 2  . Years of education: Not on file  . Highest education level: Not on file  Occupational History  . Not on file  Social Needs  . Financial resource strain: Not on file  . Food insecurity    Worry: Not on file    Inability: Not on file  . Transportation needs    Medical: Not on file    Non-medical: Not on file  Tobacco Use  . Smoking status: Current Some Day Smoker    Years: 25.00    Types: Cigarettes, E-cigarettes    Last attempt to quit: 12/17/2016    Years since quitting: 2.6  . Smokeless tobacco: Never Used  . Tobacco comment: 10 cigs per day  Substance and Sexual Activity  . Alcohol use: No  . Drug use: No  . Sexual activity: Yes  Lifestyle  . Physical activity    Days per  week: Not on file    Minutes per session: Not on file  . Stress: Not on file  Relationships  . Social Herbalist on phone: Not on file    Gets together: Not on file    Attends religious service: Not on file    Active member of club or organization: Not on file    Attends meetings of clubs or organizations: Not on file    Relationship status: Not on file  . Intimate partner violence    Fear of current or ex partner: Not on file    Emotionally abused: Not on file    Physically abused: Not on file    Forced sexual activity: Not on file  Other Topics Concern  . Not on file  Social History Narrative   Pt lives in [place] with spouse and 89 year old son.   Works Nurse, adult for [employer].  Current Outpatient Medications on File Prior to Visit  Medication Sig Dispense Refill  . acetaminophen (TYLENOL) 500 MG tablet Take 500 mg by mouth 2 (two) times daily as needed for moderate pain.    Marland Kitchen amLODipine (NORVASC) 10 MG tablet Take 1 tablet (10 mg total) by mouth daily. 90 tablet 1  . apixaban (ELIQUIS) 5 MG TABS tablet Take 1 tablet (5 mg total) by mouth 2 (two) times daily. 180 tablet 3  . bisacodyl (DULCOLAX) 5 MG EC tablet Take 5 mg by mouth at bedtime.    . cloNIDine (CATAPRES - DOSED IN MG/24 HR) 0.2 mg/24hr patch Place 1 patch (0.2 mg total) onto the skin once a week. 12 patch 3  . docusate sodium (PX DOCUSATE SODIUM) 100 MG capsule Take 100 mg by mouth 2 (two) times daily.    Marland Kitchen ezetimibe (ZETIA) 10 MG tablet TAKE 1 TABLET BY MOUTH ONCE DAILY 90 tablet 0  . fexofenadine (ALLEGRA) 180 MG tablet Take 180 mg by mouth daily as needed (seasonal allergies).     . Nebivolol HCl (BYSTOLIC) 20 MG TABS Take 1 tablet (20 mg total) by mouth daily. 90 tablet 1  . nitroGLYCERIN (NITROSTAT) 0.4 MG SL tablet Place 1 tablet (0.4 mg total) under the tongue every 5 (five) minutes x 3 doses as needed for chest pain. 25 tablet 2  . olmesartan-hydrochlorothiazide (BENICAR HCT) 20-12.5  MG tablet Take 1 tablet by mouth daily.    . ondansetron (ZOFRAN) 4 MG tablet Take 4 mg by mouth 2 (two) times daily as needed for nausea/vomiting.  2  . pravastatin (PRAVACHOL) 40 MG tablet TAKE 1 TABLET BY MOUTH IN THE EVENING AFTER DINNER 30 tablet 10  . promethazine (PHENERGAN) 25 MG tablet Take 25 mg by mouth every 6 (six) hours as needed for nausea or vomiting.     . senna-docusate (SENOKOT-S) 8.6-50 MG tablet Take 1 tablet by mouth at bedtime.    . shark liver oil-cocoa butter (PREPARATION H) 0.25-3-85.5 % suppository Place 1 suppository rectally 2 (two) times daily as needed for hemorrhoids.    . torsemide (DEMADEX) 20 MG tablet Take 20 mg by mouth as needed.      No current facility-administered medications on file prior to visit.    Review of Systems  Constitution: Positive for malaise/fatigue. Negative for decreased appetite, weight gain and weight loss.  Eyes: Negative for visual disturbance.  Cardiovascular: Positive for dyspnea on exertion and leg swelling. Negative for orthopnea, palpitations and syncope.  Respiratory: Negative for hemoptysis and wheezing.   Endocrine: Negative for cold intolerance and heat intolerance.  Hematologic/Lymphatic: Does not bruise/bleed easily.  Skin: Negative for nail changes.  Musculoskeletal: Negative for muscle weakness and myalgias.  Gastrointestinal: Negative for abdominal pain, change in bowel habit, nausea and vomiting.  Neurological: Positive for dizziness. Negative for difficulty with concentration, focal weakness and headaches.  Psychiatric/Behavioral: Negative for altered mental status and suicidal ideas.  All other systems reviewed and are negative.     Objective  There were no vitals taken for this visit. There is no height or weight on file to calculate BMI.   Physical Exam  Constitutional: He is oriented to person, place, and time. Vital signs are normal. He appears well-developed and well-nourished.  Moderately obese  HENT:   Head: Normocephalic and atraumatic.  Neck: Normal range of motion. Neck supple.  Cardiovascular: Normal rate, regular rhythm, normal heart sounds and intact distal pulses.  Trace leg edema bilateral  Pulmonary/Chest: Effort normal and breath  sounds normal. No accessory muscle usage. No respiratory distress.  Abdominal: Soft. Bowel sounds are normal.  Musculoskeletal: Normal range of motion.  Neurological: He is alert and oriented to person, place, and time.  Skin: Skin is warm and dry.  Vitals reviewed.  Radiology: No results found.  Laboratory examination:    CMP Latest Ref Rng & Units 03/21/2019 03/18/2019 11/28/2017  Glucose 70 - 99 mg/dL 110(H) 116(H) 96  BUN 6 - 20 mg/dL 13 15 10   Creatinine 0.61 - 1.24 mg/dL 0.92 0.93 0.85  Sodium 135 - 145 mmol/L 136 139 136  Potassium 3.5 - 5.1 mmol/L 3.6 4.3 3.3(L)  Chloride 98 - 111 mmol/L 103 102 103  CO2 22 - 32 mmol/L 24 30(H) 24  Calcium 8.9 - 10.3 mg/dL 8.3(L) 9.5 8.2(L)  Total Protein 6.5 - 8.1 g/dL - - -  Total Bilirubin 0.3 - 1.2 mg/dL - - -  Alkaline Phos 38 - 126 U/L - - -  AST 15 - 41 U/L - - -  ALT 17 - 63 U/L - - -   CBC Latest Ref Rng & Units 03/21/2019 03/18/2019 11/28/2017  WBC 4.0 - 10.5 K/uL 10.4 10.4 9.8  Hemoglobin 13.0 - 17.0 g/dL 16.5 17.2 13.6  Hematocrit 39.0 - 52.0 % 48.1 49.1 39.7  Platelets 150 - 400 K/uL 258 285 242   Lipid Panel     Component Value Date/Time   CHOL 198 11/27/2017 0634   TRIG 78 11/27/2017 0634   HDL 40 (L) 11/27/2017 0634   CHOLHDL 5.0 11/27/2017 0634   VLDL 16 11/27/2017 0634   LDLCALC 142 (H) 11/27/2017 0634   HEMOGLOBIN A1C Lab Results  Component Value Date   HGBA1C 6.0 (H) 11/27/2017   MPG 125.5 11/27/2017   TSH No results for input(s): TSH in the last 8760 hours.  Cardiac Studies:   Coronary angiogram 03/21/2019:  No significant change in coronary anatomy since number of 2019.  Proximal LAD had diffusely diseased 60% stenosis, FFR 0.85, DFR 0.92.  Lesion not  significant. Circumflex is dominant.  There was in-stent restenosis followed by tandem 90% stenosis in the PDA branch of circumflex (marked as OM 2 on the figure) S/P 2.5 x 24 mm Synergy DES, stenosis reduced to 0%.  TIMI-3 to TIMI-3 flow.  Mid circumflex stent widely patent. Nondominant RCA in the proximal segment has a 70 to 80% stenosis.  It gives origin to a very small PDA but in general appears to be nondominant.  Coronary angiogram 09/21/2018: Circumflex stent placed 11/27/2017, 4.5 x 18 mm resolute widely patent, patent 3.5 x 18 mm Xience DES to dominant distal left circumflex, 2.5 x 12 mm Xience to OM1 placed 05/30/2013. Ostial to proximal LAD 60% stenosis, FFR 0.81 and GFR 0.90, not physiologically significant. Mildly elevated LVEDP.  Event Monitor 30 days 07/28/2018: Paroxysmal episodes of atrial fibrillation with controlled ventricular response was noted along with PVCs. Some of this symptoms of chest pain, palpitations and dizziness correlated with atrial fibrillation, others showed normal sinus rhythm. No heart block, no other significant arrhythmias.  Echo 11/27/2017: Normal LVEF. Mild LVH. Normal diastolic parameters. Mild LA enlargement.  Direct current cardioversion ( Ist 05/27/16) 06/11/2016: 150x2, 200 x1 to NSR on Sotalol. A. Fib ablation by Dr. [name] on 11/13/2016  Assessment   1. Somatoform disorder   2. Chronic chest pain   3. Coronary artery disease of native artery of native heart with stable angina pectoris ([place])   4. Other persistent atrial fibrillation ([place])  EKG 03/31/2019: Atrial fibrillation controlled ventricular response at the rate of 70 bpm, normal axis.  Nonspecific T abnormality.  Normal QT interval. No significant change from  EKG 03/17/2019  Recommendations:   Patient with known coronary artery disease and chronic chest pain syndrome and multiple somatic complaints, on his last office visit I started him on clonidine patch.  Since then his blood  pressures not well controlled, symptoms of dyspnea has improved and palpitation symptoms are also improved.  He was complaining about headaches previously which is now essentially resolved.  Advised him to continue present medical therapy, I will see him back in 6 months or sooner if problems.   [name], MD, [facility] 08/11/2019, 2:06 PM [facility] [place] Pager: [phone] Office: [phone] If no answer Cell [phone]

## 2019-09-11 ENCOUNTER — Other Ambulatory Visit: Payer: Self-pay

## 2019-09-13 MED ORDER — EZETIMIBE 10 MG PO TABS: 10.0000 mg | ORAL_TABLET | Freq: Every day | ORAL | 0 refills | Status: DC

## 2019-10-31 ENCOUNTER — Telehealth: Payer: Self-pay | Admitting: Cardiology

## 2019-10-31 ENCOUNTER — Ambulatory Visit: Payer: BLUE CROSS/BLUE SHIELD | Admitting: Cardiology

## 2019-10-31 NOTE — Progress Notes (Deleted)
Primary Physician/Referring:  [name], NP  Patient ID: Danny Kelley, male    DOB: [birth year], 53 y.o.   MRN: [id number]  No chief complaint on file.  HPI:    Danny Kelley  is a 53 y.o. Caucasian male  with CAD with angioplasty to superdominant left circumflex coronary artery, hypertension, hyperlipidemia, OSA on CPAP and compliant, tobacco use, quit in March 2017, h/o TIA in Feb 2018 while on anticoagulants, paroxysmal Atrial fib S/P ablation 11/13/2016 by Dr. Rayann Heman. He had maintained sinus on Multaq. He does not tolerate Tikosyn or sotalol due to prolongation in QT, Now he is on rate control strategy as he appears to be asymptomatic with A. Fib.  He has now been diagnosed as having seizures and was started on [facility] 07/27/18. Also apart from seizures In the past he was also evaluate for recurrent syncope which neurology evaluation was unyielding and felt to be cardiogenic but I felt it was probably psychogenic.  On his last office visit a month ago, I started him on clonidine patch which he is tolerating.  Since then he has noticed marked improvement in dyspnea, states that even his leg edema is improved, also his headaches have essentially resolved.  He still has occasional palpitations from A. Fib. ***   Past Medical History:  Diagnosis Date  . Arthritis    "a little in my right knee" (07/14/2016)  . CAD (coronary artery disease)   . Chronic congestive heart failure with left ventricular diastolic dysfunction ([place])   . Complication of anesthesia    Pt reports "they have a hard time waking me up"  . GERD (gastroesophageal reflux disease)    hx  . Headache    "with every heart issue that I have" (07/14/2016)  . High cholesterol   . History of hiatal hernia 1990s   "fixed it w/scope down my throat"  . Hypertension   . Myocardial infarction [facility]) 05/2013   stents: left PDA, 1st OM at [facility]  . Obesity   . Obstructive sleep apnea    previous test  "inconclusive" per patient,  has appointment with [facility] 10/06/16 for evaluation  . Persistent atrial fibrillation   . Pneumonia ~ 1992  . QT prolongation 07/15/2016  . Seasonal allergies    "I take Allegra prn" (07/14/2016)  . Tobacco abuse 12/16/2016   Past Surgical History:  Procedure Laterality Date  . APPENDECTOMY  1984  . CARDIOVERSION N/A 05/27/2016   Procedure: CARDIOVERSION;  Surgeon: [name], MD;  Location: [facility] ENDOSCOPY;  Service: Cardiovascular;  Laterality: N/A;  . CARDIOVERSION N/A 06/11/2016   Procedure: CARDIOVERSION;  Surgeon: [name], MD;  Location: [place];  Service: Cardiovascular;  Laterality: N/A;  . CORONARY ANGIOPLASTY WITH STENT PLACEMENT  05/30/2013   "2 stents put in at [facility]" & /stent card  . CORONARY STENT INTERVENTION N/A 11/27/2017   Procedure: CORONARY STENT INTERVENTION;  Surgeon: [name], MD;  Location: [place] CV LAB;  Service: Cardiovascular;  Laterality: N/A;  . CORONARY STENT INTERVENTION N/A 03/21/2019   Procedure: CORONARY STENT INTERVENTION;  Surgeon: [name], MD;  Location: [place] CV LAB;  Service: Cardiovascular;  Laterality: N/A;  . ELECTROPHYSIOLOGIC STUDY N/A 11/13/2016   Procedure: Atrial Fibrillation Ablation;  Surgeon: [name], MD;  Location: [place] CV [facility];  Service: Cardiovascular;  Laterality: N/A;  . ESOPHAGOGASTRODUODENOSCOPY (EGD) WITH ESOPHAGEAL DILATION  1990s X 2  . INTRAVASCULAR PRESSURE WIRE/FFR STUDY N/A 09/21/2018   Procedure: INTRAVASCULAR  PRESSURE WIRE/FFR STUDY;  Surgeon: [name], MD;  Location: [place] [facility];  Service: Cardiovascular;  Laterality: N/A;  . INTRAVASCULAR PRESSURE WIRE/FFR STUDY N/A 03/21/2019   Procedure: INTRAVASCULAR PRESSURE WIRE/FFR STUDY;  Surgeon: [name], MD;  Location: [place] [facility];  Service: Cardiovascular;  Laterality: N/A;  . KNEE ARTHROSCOPY Right 1986; ~ 1995 X 2  . LAPAROSCOPIC CHOLECYSTECTOMY  2015  . LEFT HEART CATH AND CORONARY  ANGIOGRAPHY N/A 11/27/2017   Procedure: LEFT HEART CATH AND CORONARY ANGIOGRAPHY;  Surgeon: [name], MD;  Location: [place] CV LAB;  Service: Cardiovascular;  Laterality: N/A;  . LEFT HEART CATH AND CORONARY ANGIOGRAPHY N/A 09/21/2018   Procedure: LEFT HEART CATH AND CORONARY ANGIOGRAPHY;  Surgeon: [name], MD;  Location: [place] [facility];  Service: Cardiovascular;  Laterality: N/A;  . LEFT HEART CATH AND CORONARY ANGIOGRAPHY N/A 03/21/2019   Procedure: LEFT HEART CATH AND CORONARY ANGIOGRAPHY;  Surgeon: [name], MD;  Location: [place] [facility];  Service: Cardiovascular;  Laterality: N/A;  . REPAIR OF ESOPHAGUS  1998   perforation of the distal esophagus from bad heartburn  . TEE WITHOUT CARDIOVERSION N/A 11/13/2016   Procedure: TRANSESOPHAGEAL ECHOCARDIOGRAM (TEE);  Surgeon: Thayer Headings, MD;  Location: [facility] ENDOSCOPY;  Service: Cardiovascular;  Laterality: N/A;   Social History   Socioeconomic History  . Marital status: Married    Spouse name: Not on file  . Number of children: 2  . Years of education: Not on file  . Highest education level: Not on file  Occupational History  . Not on file  Tobacco Use  . Smoking status: Current Some Day Smoker    Years: 25.00    Types: Cigarettes, E-cigarettes    Last attempt to quit: 12/17/2016    Years since quitting: 2.8  . Smokeless tobacco: Never Used  . Tobacco comment: 10 cigs per day  Substance and Sexual Activity  . Alcohol use: No  . Drug use: No  . Sexual activity: Yes  Other Topics Concern  . Not on file  Social History Narrative   Pt lives in [place] with spouse and 31 year old son.   Works Nurse, adult for [employer].   Social Determinants of Health   Financial Resource Strain:   . Difficulty of Paying Living Expenses: Not on file  Food Insecurity:   . Worried About Charity fundraiser in the Last Year: Not on file  . Ran Out of Food in the Last Year: Not on file  Transportation Needs:   . Lack of  Transportation (Medical): Not on file  . Lack of Transportation (Non-Medical): Not on file  Physical Activity:   . Days of Exercise per Week: Not on file  . Minutes of Exercise per Session: Not on file  Stress:   . Feeling of Stress : Not on file  Social Connections:   . Frequency of Communication with Friends and Family: Not on file  . Frequency of Social Gatherings with Friends and Family: Not on file  . Attends Religious Services: Not on file  . Active Member of Clubs or Organizations: Not on file  . Attends Archivist Meetings: Not on file  . Marital Status: Not on file  Intimate Partner Violence:   . Fear of Current or Ex-Partner: Not on file  . Emotionally Abused: Not on file  . Physically Abused: Not on file  . Sexually Abused: Not on file   ROS  ***Review of Systems  Constitution: Positive for malaise/fatigue. Negative for decreased appetite, weight gain and weight loss.  Eyes: Negative for visual disturbance.  Cardiovascular: Positive for dyspnea on exertion and leg swelling. Negative for orthopnea, palpitations and syncope.  Respiratory: Negative for hemoptysis and wheezing.   Endocrine: Negative for cold intolerance and heat intolerance.  Hematologic/Lymphatic: Does not bruise/bleed easily.  Skin: Negative for nail changes.  Musculoskeletal: Negative for muscle weakness and myalgias.  Gastrointestinal: Negative for abdominal pain, change in bowel habit, nausea and vomiting.  Neurological: Positive for dizziness. Negative for difficulty with concentration, focal weakness and headaches.  Psychiatric/Behavioral: Negative for altered mental status and suicidal ideas.  All other systems reviewed and are negative.  Objective  There were no vitals taken for this visit.  Vitals with BMI 05/03/2019 03/31/2019 03/21/2019  Height 5\' 10"  5\' 10"  -  Weight 220 lbs 223 lbs 6 oz -  BMI [id number] -  Systolic [id number] [id number]  Diastolic 82 [id number] 90  Pulse 69 72 -      ***Physical Exam  Constitutional: He is oriented to person, place, and time. Vital signs are normal. He appears well-developed and well-nourished.  Moderately obese  HENT:  Head: Normocephalic and atraumatic.  Cardiovascular: Normal rate, regular rhythm, normal heart sounds and intact distal pulses.  Trace leg edema bilateral  Pulmonary/Chest: Effort normal and breath sounds normal. No accessory muscle usage. No respiratory distress.  Abdominal: Soft. Bowel sounds are normal.  Musculoskeletal:        General: Normal range of motion.     Cervical back: Normal range of motion and neck supple.  Neurological: He is alert and oriented to person, place, and time.  Skin: Skin is warm and dry.  Vitals reviewed.  Laboratory examination:   Recent Labs    03/18/19 1040 03/21/19 0615  NA 139 136  K 4.3 3.6  CL 102 103  CO2 30* 24  GLUCOSE 116* 110*  BUN 15 13  CREATININE 0.93 0.92  CALCIUM 9.5 8.3*  GFRNONAA 95 >60  GFRAA 110 >60   CrCl cannot be calculated (Patient's most recent lab result is older than the maximum 21 days allowed.).  CMP Latest Ref Rng & Units 03/21/2019 03/18/2019 11/28/2017  Glucose 70 - 99 mg/dL 110(H) 116(H) 96  BUN 6 - 20 mg/dL 13 15 10   Creatinine 0.61 - 1.24 mg/dL 0.92 0.93 0.85  Sodium 135 - 145 mmol/L 136 139 136  Potassium 3.5 - 5.1 mmol/L 3.6 4.3 3.3(L)  Chloride 98 - 111 mmol/L 103 102 103  CO2 22 - 32 mmol/L 24 30(H) 24  Calcium 8.9 - 10.3 mg/dL 8.3(L) 9.5 8.2(L)  Total Protein 6.5 - 8.1 g/dL - - -  Total Bilirubin 0.3 - 1.2 mg/dL - - -  Alkaline Phos 38 - 126 U/L - - -  AST 15 - 41 U/L - - -  ALT 17 - 63 U/L - - -   CBC Latest Ref Rng & Units 03/21/2019 03/18/2019 11/28/2017  WBC 4.0 - 10.5 K/uL 10.4 10.4 9.8  Hemoglobin 13.0 - 17.0 g/dL 16.5 17.2 13.6  Hematocrit 39.0 - 52.0 % 48.1 49.1 39.7  Platelets 150 - 400 K/uL 258 285 242   Lipid Panel     Component Value Date/Time   CHOL 198 11/27/2017 0634   TRIG 78 11/27/2017 0634   HDL 40 (L)  11/27/2017 0634   CHOLHDL 5.0 11/27/2017 0634   VLDL 16 11/27/2017 0634   LDLCALC 142 (H) 11/27/2017 0634   HEMOGLOBIN A1C  Lab Results  Component Value Date   HGBA1C 6.0 (H) 11/27/2017   MPG 125.5 11/27/2017   TSH No results for input(s): TSH in the last 8760 hours.  ***  Medications and allergies   Allergies  Allergen Reactions  . Crestor [Rosuvastatin] Other (See Comments)    Severe leg cramps  . Dilaudid [Hydromorphone] Itching and Swelling  . Isosorbide     Muscle pain and cramps  . Lexapro [Escitalopram]     Have bad thoughts made anxiety worse   . Lipitor [Atorvastatin Calcium] Other (See Comments)    myalgias  . Erythromycin Other (See Comments)    Hallucinations   . Oxycodone Itching and Rash    Swelling to face, hands, mouth Completley intolerant to any meds with oxycodone in it      Current Outpatient Medications  Medication Instructions  . acetaminophen (TYLENOL) 500 mg, Oral, 2 times daily PRN  . amLODipine (NORVASC) 10 mg, Oral, Daily  . apixaban (ELIQUIS) 5 mg, Oral, 2 times daily  . bisacodyl (DULCOLAX) 5 mg, Oral, Daily at bedtime  . Bystolic 20 mg, Oral, Daily  . cloNIDine (CATAPRES - DOSED IN MG/24 HR) 0.2 mg, Transdermal, Weekly  . docusate sodium (PX DOCUSATE SODIUM) 100 mg, Oral, 2 times daily  . ezetimibe (ZETIA) 10 mg, Oral, Daily  . fexofenadine (ALLEGRA) 180 mg, Oral, Daily PRN  . nitroGLYCERIN (NITROSTAT) 0.4 mg, Sublingual, Every 5 min x3 PRN  . olmesartan-hydrochlorothiazide (BENICAR HCT) 20-12.5 MG tablet 1 tablet, Oral, Daily  . ondansetron (ZOFRAN) 4 mg, Oral, 2 times daily PRN  . pravastatin (PRAVACHOL) 40 MG tablet TAKE 1 TABLET BY MOUTH IN THE EVENING AFTER DINNER  . promethazine (PHENERGAN) 25 mg, Oral, Every 6 hours PRN  . senna-docusate (SENOKOT-S) 8.6-50 MG tablet 1 tablet, Oral, Daily at bedtime  . shark liver oil-cocoa butter (PREPARATION H) 0.25-3-85.5 % suppository 1 suppository, Rectal, 2 times daily PRN  . torsemide  (DEMADEX) 20 mg, Oral, As needed    Radiology:  No results found.  Cardiac Studies:   Direct current cardioversion ( Ist 05/27/16) 06/11/2016: 150x2, 200 x1 to NSR on Sotalol. A. Fib ablation by Dr. [name] on 11/13/2016  Event Monitor 30 days 07/28/2018: Paroxysmal episodes of atrial fibrillation with controlled ventricular response was noted along with PVCs. Some of this symptoms of chest pain, palpitations and dizziness correlated with atrial fibrillation, others showed normal sinus rhythm. No heart block, no other significant arrhythmias.  Echo 11/27/2017: Normal LVEF. Mild LVH. Normal diastolic parameters. Mild LA enlargement.  Coronary angiogram 03/21/2019:  No significant change in coronary anatomy since number of 2019.  Proximal LAD had diffusely diseased 60% stenosis, FFR 0.85, DFR 0.92.  Lesion not significant. Circumflex is dominant. Circumflex stent placed 11/27/2017, 4.5 x 18 mm resolute widely patent, patent 3.5 x 18 mm Xience DES to dominant distal left circumflex.  There was in-stent restenosis ( 2.5 x 12 mm Xience 05/30/2013) followed by tandem 90% stenosis in the PDA branch of circumflex (marked as OM 2 on the figure) S/P 2.5 x 24 mm Synergy DES, stenosis reduced to 0%.   Nondominant RCA in the proximal segment has a 70 to 80% stenosis.  It gives origin to a very small PDA but in general appears to be nondominant.   Assessment  No diagnosis found.  ***  EKG 03/31/2019: Atrial fibrillation controlled ventricular response at the rate of 70 bpm, normal axis.  Nonspecific T abnormality.  Normal QT interval.  Recommendations:  No orders of  the defined types were placed in this encounter.   ***  [name], MD, [facility] 10/31/2019, 11:25 AM [facility] Pager: [phone] Office: [phone]

## 2019-12-20 ENCOUNTER — Ambulatory Visit (INDEPENDENT_AMBULATORY_CARE_PROVIDER_SITE_OTHER): Payer: Medicare Other | Admitting: Cardiology

## 2019-12-20 ENCOUNTER — Encounter: Payer: Self-pay | Admitting: Cardiology

## 2019-12-20 ENCOUNTER — Other Ambulatory Visit: Payer: Self-pay

## 2019-12-20 VITALS — BP 147/95 | HR 72 | Temp 96.1°F | Resp 16 | Ht 70.0 in | Wt 257.0 lb

## 2019-12-20 DIAGNOSIS — I25118 Atherosclerotic heart disease of native coronary artery with other forms of angina pectoris: Secondary | ICD-10-CM

## 2019-12-20 DIAGNOSIS — I4821 Permanent atrial fibrillation: Secondary | ICD-10-CM

## 2019-12-20 DIAGNOSIS — I10 Essential (primary) hypertension: Secondary | ICD-10-CM

## 2019-12-20 DIAGNOSIS — I5032 Chronic diastolic (congestive) heart failure: Secondary | ICD-10-CM | POA: Diagnosis not present

## 2019-12-20 MED ORDER — METOPROLOL TARTRATE 50 MG PO TABS: 25.0000 mg | ORAL_TABLET | Freq: Two times a day (BID) | ORAL | 2 refills | Status: DC

## 2019-12-20 MED ORDER — CLONIDINE HCL 0.2 MG PO TABS: 0.2000 mg | ORAL_TABLET | Freq: Three times a day (TID) | ORAL | 1 refills | Status: DC

## 2019-12-20 NOTE — Progress Notes (Signed)
Primary Physician/Referring:  [name], NP  Patient ID: Danny Kelley, male    DOB: 1965/12/12, 54 y.o.   MRN: [id number]  Chief Complaint  Patient presents with   Hypertension   HPI: Danny Kelley  is a 54 y.o. male  with CAD, unstable angina pectoris on 11/26/2017, angioplasty to superdominant left circumflex coronary artery with 4.5 mm resolute DES in the midsegment, patent stent placed in Distal Cx and OM 1 in 2014, 03/22/2019, stenting to high-grade stenosis in the distal dominant Circumflex PDA branch. He has hypertension, hyperlipidemia, OSA on CPAP and compliant, tobacco use, quit in March 2017, h/o TIA in Feb 2018 while on anticoagulants, paroxysmal Atrial fib S/P ablation 11/13/2016 by Dr. Rayann Heman, now in permanent atrial fibrillation with no symptoms, seizure disorder and presently on Leveracetam since Sept 2019, prior evaluation for syncope which I felt was psychogenic presents for evaluation of A. Fib and hypertensin and chest pain.  He now has disability in place. He has gained about 25 Lbs. Has multiple issues including uncontrolled BP, chest pain, dyspnea and fatigue. States he has not been able to exercise.   Past Medical History:  Diagnosis Date   Arthritis    "a little in my right knee" (07/14/2016)   CAD (coronary artery disease)    Chronic congestive heart failure with left ventricular diastolic dysfunction (HCC)    Complication of anesthesia    Pt reports "they have a hard time waking me up"   GERD (gastroesophageal reflux disease)    hx   Headache    "with every heart issue that I have" (07/14/2016)   High cholesterol    History of hiatal hernia 1990s   "fixed it w/scope down my throat"   Hypertension    Myocardial infarction ([place]) 05/2013   stents: left PDA, 1st OM at [facility]   Obesity    Obstructive sleep apnea    previous test "inconclusive" per patient,  has appointment with [facility] 10/06/16 for evaluation    Persistent atrial fibrillation ([place])    Pneumonia ~ 1992   QT prolongation 07/15/2016   Seasonal allergies    "I take Allegra prn" (07/14/2016)   Tobacco abuse 12/16/2016    Past Surgical History:  Procedure Laterality Date   APPENDECTOMY  1984   CARDIOVERSION N/A 05/27/2016   Procedure: CARDIOVERSION;  Surgeon: [name], MD;  Location: [place];  Service: Cardiovascular;  Laterality: N/A;   CARDIOVERSION N/A 06/11/2016   Procedure: CARDIOVERSION;  Surgeon: [name], MD;  Location: [facility] ENDOSCOPY;  Service: Cardiovascular;  Laterality: N/A;   CORONARY ANGIOPLASTY WITH STENT PLACEMENT  05/30/2013   "2 stents put in at [facility]" & /stent card   CORONARY STENT INTERVENTION N/A 11/27/2017   Procedure: CORONARY STENT INTERVENTION;  Surgeon: [name], MD;  Location: [place] CV LAB;  Service: Cardiovascular;  Laterality: N/A;   CORONARY STENT INTERVENTION N/A 03/21/2019   Procedure: CORONARY STENT INTERVENTION;  Surgeon: [name], MD;  Location: [place] CV LAB;  Service: Cardiovascular;  Laterality: N/A;   ELECTROPHYSIOLOGIC STUDY N/A 11/13/2016   Procedure: Atrial Fibrillation Ablation;  Surgeon: [name], MD;  Location: [place] CV LAB;  Service: Cardiovascular;  Laterality: N/A;   ESOPHAGOGASTRODUODENOSCOPY (EGD) WITH ESOPHAGEAL DILATION  1990s X 2   INTRAVASCULAR PRESSURE WIRE/FFR STUDY N/A 09/21/2018   Procedure: INTRAVASCULAR PRESSURE WIRE/FFR STUDY;  Surgeon: [name], MD;  Location: [place] CV LAB;  Service: Cardiovascular;  Laterality: N/A;   INTRAVASCULAR PRESSURE  WIRE/FFR STUDY N/A 03/21/2019   Procedure: INTRAVASCULAR PRESSURE WIRE/FFR STUDY;  Surgeon: [name], MD;  Location: [place] CV [facility];  Service: Cardiovascular;  Laterality: N/A;   KNEE ARTHROSCOPY Right 1986; ~ 1995 X [place]  2015   LEFT HEART CATH AND CORONARY ANGIOGRAPHY N/A 11/27/2017   Procedure: LEFT HEART CATH AND CORONARY ANGIOGRAPHY;  Surgeon:  [name], MD;  Location: [place] [facility];  Service: Cardiovascular;  Laterality: N/A;   LEFT HEART CATH AND CORONARY ANGIOGRAPHY N/A 09/21/2018   Procedure: LEFT HEART CATH AND CORONARY ANGIOGRAPHY;  Surgeon: [name], MD;  Location: [place] CV [facility];  Service: Cardiovascular;  Laterality: N/A;   LEFT HEART CATH AND CORONARY ANGIOGRAPHY N/A 03/21/2019   Procedure: LEFT HEART CATH AND CORONARY ANGIOGRAPHY;  Surgeon: [name], MD;  Location: [place] CV LAB;  Service: Cardiovascular;  Laterality: N/A;   REPAIR OF ESOPHAGUS  1998   perforation of the distal esophagus from bad heartburn   TEE WITHOUT CARDIOVERSION N/A 11/13/2016   Procedure: TRANSESOPHAGEAL ECHOCARDIOGRAM (TEE);  Surgeon: Thayer Headings, MD;  Location: [facility] ENDOSCOPY;  Service: Cardiovascular;  Laterality: N/A;   Social History   Tobacco Use   Smoking status: Current Some Day Smoker    Years: 25.00    Types: Cigarettes, E-cigarettes    Last attempt to quit: 12/17/2016    Years since quitting: 3.0   Smokeless tobacco: Never Used   Tobacco comment: 10 cigs per day  Substance Use Topics   Alcohol use: No   Current Outpatient Medications on File Prior to Visit  Medication Sig Dispense Refill   acetaminophen (TYLENOL) 500 MG tablet Take 500 mg by mouth 2 (two) times daily as needed for moderate pain.     amLODipine (NORVASC) 10 MG tablet Take 1 tablet (10 mg total) by mouth daily. 90 tablet 1   apixaban (ELIQUIS) 5 MG TABS tablet Take 1 tablet (5 mg total) by mouth 2 (two) times daily. 180 tablet 3   bisacodyl (DULCOLAX) 5 MG EC tablet Take 5 mg by mouth at bedtime.     cloNIDine (CATAPRES - DOSED IN MG/24 HR) 0.2 mg/24hr patch Place 1 patch (0.2 mg total) onto the skin once a week. 12 patch 3   docusate sodium (PX DOCUSATE SODIUM) 100 MG capsule Take 100 mg by mouth 2 (two) times daily.     ezetimibe (ZETIA) 10 MG tablet Take 1 tablet (10 mg total) by mouth daily. 90 tablet 0   nitroGLYCERIN  (NITROSTAT) 0.4 MG SL tablet Place 1 tablet (0.4 mg total) under the tongue every 5 (five) minutes x 3 doses as needed for chest pain. 25 tablet 2   pravastatin (PRAVACHOL) 40 MG tablet TAKE 1 TABLET BY MOUTH IN THE EVENING AFTER DINNER 30 tablet 10   shark liver oil-cocoa butter (PREPARATION H) 0.25-3-85.5 % suppository Place 1 suppository rectally 2 (two) times daily as needed for hemorrhoids.     torsemide (DEMADEX) 20 MG tablet Take 20 mg by mouth as needed.      fexofenadine (ALLEGRA) 180 MG tablet Take 180 mg by mouth daily as needed (seasonal allergies).      No current facility-administered medications on file prior to visit.   Review of Systems  Constitution: Positive for malaise/fatigue and weight gain. Negative for decreased appetite and weight loss.  Eyes: Negative for visual disturbance.  Cardiovascular: Positive for dyspnea on exertion and leg swelling. Negative for chest pain, orthopnea, palpitations and syncope.  Hematologic/Lymphatic:  Does not bruise/bleed easily.  Skin: Negative for nail changes.  Musculoskeletal: Negative for muscle weakness and myalgias.  Gastrointestinal: Positive for diarrhea. Negative for abdominal pain and change in bowel habit.  Neurological: Negative for difficulty with concentration and headaches.  Psychiatric/Behavioral: Positive for depression. Negative for altered mental status and suicidal ideas. The patient is nervous/anxious.   All other systems reviewed and are negative.  Objective  Blood pressure (!) 147/95, pulse 72, temperature (!) 96.1 F (35.6 C), temperature source Temporal, resp. rate 16, height '5\' 10"'  (1.778 m), weight 257 lb (116.6 kg), SpO2 96 %. Body mass index is 36.88 kg/m.   Vitals with BMI 12/20/2019 12/20/2019 05/03/2019  Height - '5\' 10"'  '5\' 10"'   Weight - 257 lbs 220 lbs  BMI - 68.03 21.22  Systolic 482 500 370  Diastolic 95 488 82  Pulse 72 57 69    Physical Exam  Constitutional: Vital signs are normal.  Moderately  obese  Cardiovascular: Normal rate, regular rhythm, normal heart sounds and intact distal pulses.  Trace leg edema bilateral  Pulmonary/Chest: Effort normal and breath sounds normal. No accessory muscle usage. No respiratory distress.  Abdominal: Soft. Bowel sounds are normal.  Musculoskeletal:        General: Normal range of motion.  Vitals reviewed.  Radiology: No results found.  Laboratory examination:    CMP Latest Ref Rng & Units 03/21/2019 03/18/2019 11/28/2017  Glucose 70 - 99 mg/dL 110(H) 116(H) 96  BUN 6 - 20 mg/dL '13 15 10  ' Creatinine 0.61 - 1.24 mg/dL 0.92 0.93 0.85  Sodium 135 - 145 mmol/L 136 139 136  Potassium 3.5 - 5.1 mmol/L 3.6 4.3 3.3(L)  Chloride 98 - 111 mmol/L 103 102 103  CO2 22 - 32 mmol/L 24 30(H) 24  Calcium 8.9 - 10.3 mg/dL 8.3(L) 9.5 8.2(L)  Total Protein 6.5 - 8.1 g/dL - - -  Total Bilirubin 0.3 - 1.2 mg/dL - - -  Alkaline Phos 38 - 126 U/L - - -  AST 15 - 41 U/L - - -  ALT 17 - 63 U/L - - -   CBC Latest Ref Rng & Units 03/21/2019 03/18/2019 11/28/2017  WBC 4.0 - 10.5 K/uL 10.4 10.4 9.8  Hemoglobin 13.0 - 17.0 g/dL 16.5 17.2 13.6  Hematocrit 39.0 - 52.0 % 48.1 49.1 39.7  Platelets 150 - 400 K/uL 258 285 242   Lipid Panel     Component Value Date/Time   CHOL 198 11/27/2017 0634   TRIG 78 11/27/2017 0634   HDL 40 (L) 11/27/2017 0634   CHOLHDL 5.0 11/27/2017 0634   VLDL 16 11/27/2017 0634   LDLCALC 142 (H) 11/27/2017 0634   HEMOGLOBIN A1C Lab Results  Component Value Date   HGBA1C 6.0 (H) 11/27/2017   MPG 125.5 11/27/2017   TSH No results for input(s): TSH in the last 8760 hours.  Cardiac Studies:   Coronary angiogram 03/21/2019:  No significant change in coronary anatomy since number of 2019.  Proximal LAD had diffusely diseased 60% stenosis, FFR 0.85, DFR 0.92.  Lesion not significant. Circumflex is dominant.  There was in-stent restenosis followed by tandem 90% stenosis in the PDA branch of circumflex (marked as OM 2 on the figure) S/P  2.5 x 24 mm Synergy DES, stenosis reduced to 0%.  TIMI-3 to TIMI-3 flow.  Mid circumflex stent widely patent. Nondominant RCA in the proximal segment has a 70 to 80% stenosis.  It gives origin to a very small PDA but in general appears  to be nondominant.  Coronary angiogram 09/21/2018: Circumflex stent placed 11/27/2017, 4.5 x 18 mm resolute widely patent, patent 3.5 x 18 mm Xience DES to dominant distal left circumflex, 2.5 x 12 mm Xience to OM1 placed 05/30/2013. Ostial to proximal LAD 60% stenosis, FFR 0.81 and GFR 0.90, not physiologically significant. Mildly elevated LVEDP.  Event Monitor 30 days 07/28/2018: Paroxysmal episodes of atrial fibrillation with controlled ventricular response was noted along with PVCs. Some of this symptoms of chest pain, palpitations and dizziness correlated with atrial fibrillation, others showed normal sinus rhythm. No heart block, no other significant arrhythmias.  Echo 11/27/2017: Normal LVEF. Mild LVH. Normal diastolic parameters. Mild LA enlargement.  Direct current cardioversion ( Ist 05/27/16) 06/11/2016: 150x2, 200 x1 to NSR on Sotalol. A. Fib ablation by Dr. [name] on 11/13/2016  Assessment     ICD-10-CM   1. Essential hypertension  I10 EKG 12-Lead    cloNIDine (CATAPRES) 0.2 MG tablet    CBC    CMP14+EGFR    TSH    PCV ECHOCARDIOGRAM COMPLETE  2. Permanent atrial fibrillation (HCC)  I48.21 metoprolol tartrate (LOPRESSOR) 50 MG tablet  3. Coronary artery disease of native artery of native heart with stable angina pectoris ([place])  I25.118   4. Chronic diastolic CHF (congestive heart failure) (HCC)  I50.32 PCV ECHOCARDIOGRAM COMPLETE  5. Atherosclerosis of native coronary artery of native heart with stable angina pectoris ([place])  I25.118 Lipid Panel With LDL/HDL Ratio    PCV ECHOCARDIOGRAM COMPLETE     EKG 12/20/2019: Atrial fibrillation with RVR at rate of 121 bpm, normal axis, inferior and lateral nonspecific T abnormality, cannot exclude  ischemia.  Consider LVH with repolarization.  Single PVC.   No significant change from  EKG 03/31/2019: Atrial fibrillation controlled ventricular response at the rate of 70 bpm, normal axis.       Patient complaints  Recommendations:   Danny Kelley  is a 54 y.o. male  with CAD, unstable angina pectoris on 11/26/2017, angioplasty to superdominant left circumflex coronary artery with 4.5 mm resolute DES in the midsegment, patent stent placed in Distal Cx and OM 1 in 2014, 03/22/2019, stenting to high-grade stenosis in the distal dominant Circumflex PDA branch. He has hypertension, hyperlipidemia, OSA on CPAP and compliant, tobacco use, quit in March 2017, h/o TIA in Feb 2018 while on anticoagulants, paroxysmal Atrial fib S/P ablation 11/13/2016 by Dr. Rayann Heman, now in permanent atrial fibrillation with no symptoms, seizure disorder and presently on Leveracetam since Sept 2019, prior evaluation for syncope which I felt was psychogenic presents for evaluation of A. Fib and hypertensin and chest pain.  As patient had no close follow-up, Bystolic was discontinued and also presently not on clonidine patch as he developed rash.  I have restarted him back on metoprolol as Bystolic is not approved by his insurance, previously he had not tolerated this.  I have also switched him to oral clonidine 0.2 mg 3 times daily.  Advised him that if blood pressure becomes well controlled, he could certainly reduce it to twice daily dosing of clonidine.  Hopefully this will also improve heart rate control with regard to A. fib with RVR.  He needs labs, he has not seen his PCP in a while.  I will repeat echocardiogram to evaluate LV systolic function in view of dyspnea and obtain to see him back in 2 weeks for follow-up.  [name], MD, [facility] 12/21/2019, 5:11 AM [facility]. [place] Office: [phone]

## 2019-12-27 ENCOUNTER — Ambulatory Visit: Payer: Medicare Other | Admitting: Cardiology

## 2019-12-28 ENCOUNTER — Ambulatory Visit: Payer: Medicare Other

## 2019-12-28 ENCOUNTER — Other Ambulatory Visit: Payer: Self-pay

## 2019-12-28 DIAGNOSIS — I10 Essential (primary) hypertension: Secondary | ICD-10-CM

## 2019-12-28 DIAGNOSIS — I5032 Chronic diastolic (congestive) heart failure: Secondary | ICD-10-CM

## 2019-12-28 DIAGNOSIS — I25118 Atherosclerotic heart disease of native coronary artery with other forms of angina pectoris: Secondary | ICD-10-CM

## 2020-01-03 LAB — CMP14+EGFR
ALT: 29 IU/L (ref 0–44)
AST: 21 IU/L (ref 0–40)
Albumin/Globulin Ratio: 1.3 (ref 1.2–2.2)
Albumin: 3.8 g/dL (ref 3.8–4.9)
Alkaline Phosphatase: 147 IU/L — ABNORMAL HIGH (ref 39–117)
BUN/Creatinine Ratio: 14 (ref 9–20)
BUN: 12 mg/dL (ref 6–24)
Bilirubin Total: 0.3 mg/dL (ref 0.0–1.2)
CO2: 26 mmol/L (ref 20–29)
Calcium: 9.4 mg/dL (ref 8.7–10.2)
Chloride: 98 mmol/L (ref 96–106)
Creatinine, Ser: 0.88 mg/dL (ref 0.76–1.27)
GFR calc Af Amer: 115 mL/min/{1.73_m2} (ref >59)
GFR calc non Af Amer: 99 mL/min/{1.73_m2} (ref >59)
Globulin, Total: 3 g/dL (ref 1.5–4.5)
Glucose: 151 mg/dL — ABNORMAL HIGH (ref 65–99)
Potassium: 4.4 mmol/L (ref 3.5–5.2)
Sodium: 139 mmol/L (ref 134–144)
Total Protein: 6.8 g/dL (ref 6.0–8.5)

## 2020-01-03 LAB — CBC
Hematocrit: 51.6 % — ABNORMAL HIGH (ref 37.5–51.0)
Hemoglobin: 18 g/dL — ABNORMAL HIGH (ref 13.0–17.7)
MCH: 29.1 pg (ref 26.6–33.0)
MCHC: 34.9 g/dL (ref 31.5–35.7)
MCV: 84 fL (ref 79–97)
Platelets: 293 10*3/uL (ref 150–450)
RBC: 6.18 x10E6/uL — ABNORMAL HIGH (ref 4.14–5.80)
RDW: 14.2 % (ref 11.6–15.4)
WBC: 12.1 10*3/uL — ABNORMAL HIGH (ref 3.4–10.8)

## 2020-01-03 LAB — LIPID PANEL WITH LDL/HDL RATIO
Cholesterol, Total: 239 mg/dL — ABNORMAL HIGH (ref 100–199)
HDL: 62 mg/dL (ref >39)
LDL Chol Calc (NIH): 152 mg/dL — ABNORMAL HIGH (ref 0–99)
LDL/HDL Ratio: 2.5 ratio (ref 0.0–3.6)
Triglycerides: 143 mg/dL (ref 0–149)
VLDL Cholesterol Cal: 25 mg/dL (ref 5–40)

## 2020-01-03 LAB — TSH: TSH: 2.08 u[IU]/mL (ref 0.450–4.500)

## 2020-01-04 ENCOUNTER — Ambulatory Visit: Payer: Medicare Other | Admitting: Cardiology

## 2020-01-04 ENCOUNTER — Encounter: Payer: Self-pay | Admitting: Cardiology

## 2020-01-04 ENCOUNTER — Other Ambulatory Visit: Payer: Self-pay

## 2020-01-04 DIAGNOSIS — I5041 Acute combined systolic (congestive) and diastolic (congestive) heart failure: Secondary | ICD-10-CM

## 2020-01-04 DIAGNOSIS — I4821 Permanent atrial fibrillation: Secondary | ICD-10-CM

## 2020-01-04 DIAGNOSIS — I10 Essential (primary) hypertension: Secondary | ICD-10-CM

## 2020-01-04 DIAGNOSIS — I25118 Atherosclerotic heart disease of native coronary artery with other forms of angina pectoris: Secondary | ICD-10-CM

## 2020-01-04 DIAGNOSIS — D751 Secondary polycythemia: Secondary | ICD-10-CM

## 2020-01-04 DIAGNOSIS — E7800 Pure hypercholesterolemia, unspecified: Secondary | ICD-10-CM

## 2020-01-04 MED ORDER — ASPIRIN EC 81 MG PO TBEC: 81.0000 mg | DELAYED_RELEASE_TABLET | Freq: Every day | ORAL | 3 refills | Status: DC

## 2020-01-04 MED ORDER — AMLODIPINE BESYLATE 10 MG PO TABS: 10.0000 mg | ORAL_TABLET | Freq: Every day | ORAL | 1 refills | Status: DC

## 2020-01-04 MED ORDER — ROSUVASTATIN CALCIUM 20 MG PO TABS: 20.0000 mg | ORAL_TABLET | Freq: Every day | ORAL | 2 refills | Status: DC

## 2020-01-04 MED ORDER — METOPROLOL TARTRATE 100 MG PO TABS: 100.0000 mg | ORAL_TABLET | Freq: Two times a day (BID) | ORAL | 2 refills | Status: DC

## 2020-01-04 NOTE — H&P (View-Only) (Signed)
Primary Physician/Referring:  [name], NP  Patient ID: Danny Kelley, male    DOB: [birth year], 54 y.o.   MRN: [id number]  Chief Complaint  Patient presents with  . Coronary Artery Disease  . Atrial Fibrillation  . Follow-up    6 month   HPI:    Danny Kelley  is a 54 y.o. Caucasian male patient with h/o unstable angina pectoris on 11/26/2017, angioplasty to superdominant left circumflex coronary artery with 4.5 mm resolute DES in the midsegment, patent stent placed in Distal Cx and OM 1 in 2014, 03/22/2019, stenting to high-grade stenosis in the distal dominant Circumflex PDA branch. He has hypertension, hyperlipidemia, OSA on CPAP and compliant, tobacco use, quit in March 2017, h/o TIA in Feb 2018 while on anticoagulants, paroxysmal Atrial fib S/P ablation 11/13/2016 by Dr. Rayann Heman, now in permanent atrial fibrillation with no symptoms, seizure disorder and presently on Leveracetam since Sept 2019, prior evaluation for syncope which I felt was psychogenic presents for evaluation of A. Fib and hypertensin and chest pain.  He now has disability in place. He has gained about 25 Lbs.  This is a 2-week office visit.  He is now tolerating metoprolol and clonidine tablets.  He has noticed improvement with regard to his headaches, dyspnea since then.  States that his blood pressure is also improved but not still under control.  No PND or orthopnea.  Past Medical History:  Diagnosis Date  . Arthritis    "a little in my right knee" (07/14/2016)  . CAD (coronary artery disease)   . Chronic congestive heart failure with left ventricular diastolic dysfunction ([place])   . Complication of anesthesia    Pt reports "they have a hard time waking me up"  . GERD (gastroesophageal reflux disease)    hx  . Headache    "with every heart issue that I have" (07/14/2016)  . High cholesterol   . History of hiatal hernia 1990s   "fixed it w/scope down my throat"  . Hypertension   . Myocardial infarction  [facility]) 05/2013   stents: left PDA, 1st OM at [facility]  . Obesity   . Obstructive sleep apnea    previous test "inconclusive" per patient,  has appointment with [facility] 10/06/16 for evaluation  . Persistent atrial fibrillation ([place])   . Pneumonia ~ 1992  . QT prolongation 07/15/2016  . Seasonal allergies    "I take Allegra prn" (07/14/2016)  . Tobacco abuse 12/16/2016   Past Surgical History:  Procedure Laterality Date  . APPENDECTOMY  1984  . CARDIOVERSION N/A 05/27/2016   Procedure: CARDIOVERSION;  Surgeon: [name], MD;  Location: [facility] ENDOSCOPY;  Service: Cardiovascular;  Laterality: N/A;  . CARDIOVERSION N/A 06/11/2016   Procedure: CARDIOVERSION;  Surgeon: [name], MD;  Location: [place];  Service: Cardiovascular;  Laterality: N/A;  . CORONARY ANGIOPLASTY WITH STENT PLACEMENT  05/30/2013   "2 stents put in at [facility]" & /stent card  . CORONARY STENT INTERVENTION N/A 11/27/2017   Procedure: CORONARY STENT INTERVENTION;  Surgeon: [name], MD;  Location: [place] CV LAB;  Service: Cardiovascular;  Laterality: N/A;  . CORONARY STENT INTERVENTION N/A 03/21/2019   Procedure: CORONARY STENT INTERVENTION;  Surgeon: [name], MD;  Location: [place] CV LAB;  Service: Cardiovascular;  Laterality: N/A;  . ELECTROPHYSIOLOGIC STUDY N/A 11/13/2016   Procedure: Atrial Fibrillation Ablation;  Surgeon: [name], MD;  Location: [place] CV LAB;  Service: Cardiovascular;  Laterality: N/A;  .  ESOPHAGOGASTRODUODENOSCOPY (EGD) WITH ESOPHAGEAL DILATION  1990s X 2  . INTRAVASCULAR PRESSURE WIRE/FFR STUDY N/A 09/21/2018   Procedure: INTRAVASCULAR PRESSURE WIRE/FFR STUDY;  Surgeon: [name], MD;  Location: [place] CV LAB;  Service: Cardiovascular;  Laterality: N/A;  . INTRAVASCULAR PRESSURE WIRE/FFR STUDY N/A 03/21/2019   Procedure: INTRAVASCULAR PRESSURE WIRE/FFR STUDY;  Surgeon: [name], MD;  Location: [place] [facility] LAB;  Service: Cardiovascular;  Laterality:  N/A;  . KNEE ARTHROSCOPY Right 1986; ~ 1995 X 2  . LAPAROSCOPIC CHOLECYSTECTOMY  2015  . LEFT HEART CATH AND CORONARY ANGIOGRAPHY N/A 11/27/2017   Procedure: LEFT HEART CATH AND CORONARY ANGIOGRAPHY;  Surgeon: [name], MD;  Location: [place] CV LAB;  Service: Cardiovascular;  Laterality: N/A;  . LEFT HEART CATH AND CORONARY ANGIOGRAPHY N/A 09/21/2018   Procedure: LEFT HEART CATH AND CORONARY ANGIOGRAPHY;  Surgeon: [name], MD;  Location: [place] [facility];  Service: Cardiovascular;  Laterality: N/A;  . LEFT HEART CATH AND CORONARY ANGIOGRAPHY N/A 03/21/2019   Procedure: LEFT HEART CATH AND CORONARY ANGIOGRAPHY;  Surgeon: [name], MD;  Location: [place] [facility];  Service: Cardiovascular;  Laterality: N/A;  . REPAIR OF ESOPHAGUS  1998   perforation of the distal esophagus from bad heartburn  . TEE WITHOUT CARDIOVERSION N/A 11/13/2016   Procedure: TRANSESOPHAGEAL ECHOCARDIOGRAM (TEE);  Surgeon: Thayer Headings, MD;  Location: [facility] ENDOSCOPY;  Service: Cardiovascular;  Laterality: N/A;   Family History  Problem Relation Age of Onset  . Heart disease Mother 66       CABG age 68  . Breast cancer Mother   . Tongue cancer Mother   . Diabetes Mother   . Heart attack Father 69       Father had around 7 heart attacks per pt  . Diabetes Father   . Lung cancer Father   . Congestive Heart Failure Father   . Lung cancer Paternal Uncle     Social History   Tobacco Use  . Smoking status: Current Some Day Smoker    Years: 25.00    Types: Cigarettes, E-cigarettes    Last attempt to quit: 12/17/2016    Years since quitting: 3.0  . Smokeless tobacco: Never Used  . Tobacco comment: 5 cigs every 3 days  Substance Use Topics  . Alcohol use: No   ROS  Review of Systems  Constitution: Positive for malaise/fatigue and weight gain. Negative for decreased appetite and weight loss.  Eyes: Negative for visual disturbance.  Cardiovascular: Positive for dyspnea on exertion and leg swelling.  Negative for chest pain, orthopnea, palpitations and syncope.  Hematologic/Lymphatic: Does not bruise/bleed easily.  Skin: Negative for nail changes.  Musculoskeletal: Negative for muscle weakness and myalgias.  Gastrointestinal: Positive for diarrhea. Negative for abdominal pain and change in bowel habit.  Neurological: Negative for difficulty with concentration and headaches.  Psychiatric/Behavioral: Positive for depression. Negative for altered mental status and suicidal ideas. The patient is nervous/anxious.   All other systems reviewed and are negative.  Objective  Blood pressure (!) 136/100, pulse (!) 118, temperature (!) 96.5 F (35.8 C), temperature source Temporal, resp. rate 16, height 5\' 10"  (1.778 m), weight 255 lb (115.7 kg), SpO2 97 %.  Vitals with BMI 01/04/2020 01/04/2020 12/20/2019  Height 5\' 10"  5\' 10"  -  Weight 255 lbs 255 lbs -  BMI Q000111Q Q000111Q -  Systolic [id number] Q000111Q  Diastolic 123XX123 [id number] 95  Pulse 118 61 72     Physical Exam  Constitutional: Vital signs  are normal.  Moderately obese  Cardiovascular: Normal rate, regular rhythm, normal heart sounds and intact distal pulses.  Trace leg edema bilateral  Pulmonary/Chest: Effort normal and breath sounds normal. No accessory muscle usage. No respiratory distress.  Abdominal: Soft. Bowel sounds are normal.  Musculoskeletal:        General: Normal range of motion.  Vitals reviewed.  Laboratory examination:   Recent Labs    03/18/19 1040 03/21/19 0615 01/02/20 1445  NA 139 136 139  K 4.3 3.6 4.4  CL 102 103 98  CO2 30* 24 26  GLUCOSE 116* 110* 151*  BUN 15 13 12   CREATININE 0.93 0.92 0.88  CALCIUM 9.5 8.3* 9.4  GFRNONAA 95 >60 99  GFRAA 110 >60 115   estimated creatinine clearance is 126.6 mL/min (by C-G formula based on SCr of 0.88 mg/dL).  CMP Latest Ref Rng & Units 01/02/2020 03/21/2019 03/18/2019  Glucose 65 - 99 mg/dL 151(H) 110(H) 116(H)  BUN 6 - 24 mg/dL 12 13 15   Creatinine 0.76 - 1.27 mg/dL 0.88 0.92  0.93  Sodium 134 - 144 mmol/L 139 136 139  Potassium 3.5 - 5.2 mmol/L 4.4 3.6 4.3  Chloride 96 - 106 mmol/L 98 103 102  CO2 20 - 29 mmol/L 26 24 30(H)  Calcium 8.7 - 10.2 mg/dL 9.4 8.3(L) 9.5  Total Protein 6.0 - 8.5 g/dL 6.8 - -  Total Bilirubin 0.0 - 1.2 mg/dL 0.3 - -  Alkaline Phos 39 - 117 IU/L 147(H) - -  AST 0 - 40 IU/L 21 - -  ALT 0 - 44 IU/L 29 - -   CBC Latest Ref Rng & Units 01/02/2020 03/21/2019 03/18/2019  WBC 3.4 - 10.8 x10E3/uL 12.1(H) 10.4 10.4  Hemoglobin 13.0 - 17.7 g/dL 18.0(H) 16.5 17.2  Hematocrit 37.5 - 51.0 % 51.6(H) 48.1 49.1  Platelets 150 - 450 x10E3/uL 293 258 285   Lipid Panel     Component Value Date/Time   CHOL 239 (H) 01/02/2020 1445   TRIG 143 01/02/2020 1445   HDL 62 01/02/2020 1445   CHOLHDL 5.0 11/27/2017 0634   VLDL 16 11/27/2017 0634   LDLCALC 152 (H) 01/02/2020 1445   HEMOGLOBIN A1C Lab Results  Component Value Date   HGBA1C 6.0 (H) 11/27/2017   MPG 125.5 11/27/2017   TSH Recent Labs    01/02/20 1445  TSH 2.080   Medications and allergies   Allergies  Allergen Reactions  . Crestor [Rosuvastatin] Other (See Comments)    Severe leg cramps  . Dilaudid [Hydromorphone] Itching and Swelling  . Isosorbide     Muscle pain and cramps  . Lexapro [Escitalopram]     Have bad thoughts made anxiety worse   . Lipitor [Atorvastatin Calcium] Other (See Comments)    myalgias  . Erythromycin Other (See Comments)    Hallucinations   . Oxycodone Itching and Rash    Swelling to face, hands, mouth Completley intolerant to any meds with oxycodone in it      Current Outpatient Medications  Medication Instructions  . acetaminophen (TYLENOL) 500 mg, Oral, 2 times daily PRN  . amLODipine (NORVASC) 10 mg, Oral, Daily  . apixaban (ELIQUIS) 5 mg, Oral, 2 times daily  . aspirin EC 81 mg, Oral, Daily  . bisacodyl (DULCOLAX) 5 mg, Oral, Daily PRN  . cloNIDine (CATAPRES) 0.2 mg, Oral, 3 times daily  . docusate sodium (PX DOCUSATE SODIUM) 100 mg,  Oral, 2 times daily PRN  . ezetimibe (ZETIA) 10 mg, Oral, Daily  .  fexofenadine (ALLEGRA) 180 mg, Oral, Daily PRN  . metoprolol tartrate (LOPRESSOR) 100 mg, Oral, 2 times daily  . nitroGLYCERIN (NITROSTAT) 0.4 mg, Sublingual, Every 5 min x3 PRN  . rosuvastatin (CRESTOR) 20 mg, Oral, Daily  . shark liver oil-cocoa butter (PREPARATION H) 0.25-3-85.5 % suppository 1 suppository, Rectal, 2 times daily PRN  . torsemide (DEMADEX) 20 mg, Oral, As needed    Radiology:   No results found.  Cardiac Studies:   Sleep study X 2 negative   Coronary angiogram 03/21/2019:  No significant change in coronary anatomy since number of 2019.  Proximal LAD had diffusely diseased 60% stenosis, FFR 0.85, DFR 0.92.  Lesion not significant. Circumflex is dominant.  There was in-stent restenosis followed by tandem 90% stenosis in the PDA branch of circumflex (marked as OM 2 on the figure) S/P 2.5 x 24 mm Synergy DES, stenosis reduced to 0%.  TIMI-3 to TIMI-3 flow.  Mid circumflex stent widely patent. Nondominant RCA in the proximal segment has a 70 to 80% stenosis.  It gives origin to a very small PDA but in general appears to be nondominant.  Coronary angiogram 09/21/2018: Circumflex stent placed 11/27/2017, 4.5 x 18 mm resolute widely patent, patent 3.5 x 18 mm Xience DES to dominant distal left circumflex, 2.5 x 12 mm Xience to OM1 placed 05/30/2013. Ostial to proximal LAD 60% stenosis, FFR 0.81 and GFR 0.90, not physiologically significant. Mildly elevated LVEDP.  Event Monitor 30 days 07/28/2018: Paroxysmal episodes of atrial fibrillation with controlled ventricular response was noted along with PVCs. Some of this symptoms of chest pain, palpitations and dizziness correlated with atrial fibrillation, others showed normal sinus rhythm. No heart block, no other significant arrhythmias.  Direct current cardioversion ( Ist 05/27/16) 06/11/2016: 150x2, 200 x1 to NSR on Sotalol. A. Fib ablation by Dr. [name] on  11/13/2016  Echocardiogram 12/28/2019:  Severely depressed LV systolic function with visual EF 25-30%. Severe  global hypokinesis Left ventricle cavity is normal in size. Moderate left  ventricular hypertrophy. Doppler evidence of grade II diastolic  dysfunction, elevated LAP. Calculated EF 29%.  No significant valvular abnormalities.  Left atrial cavity is mildly dilated.  Right ventricle cavity is normal in size but visually low normal right  ventricular function.  The aortic root is dilated, 4.0cm at the level of sinus of Valsalva.  Proximal ascending aorta not well visualized.  Prior study dated 11/27/2017: Normal LVEF. Mild LVH. Normal diastolic  parameters. Mild LA enlargement.  EKG 01/04/2020: Atrial fibrillation with rapid ventricular response at the rate of 111 bpm, normal axis, diffuse ST-T wave abnormality, cannot exclude inferior ischemia, anterolateral ischemia.  Normal QT interval.   No significant change from  EKG 12/20/2019: Atrial fibrillation with RVR at rate of 121 bpm  EKG 03/31/2019: Atrial fibrillation controlled ventricular response at the rate of 70 bpm, normal axis.   Assessment     ICD-10-CM   1. Permanent atrial fibrillation ([place]). CHA2DS2-VASc Score is 5.  Yearly risk of stroke: 6.7% (HTN, Vasc Dz, CHF, TIA).     I48.21 EKG 12-Lead    metoprolol tartrate (LOPRESSOR) 100 MG tablet    Ambulatory referral to Cardiac Electrophysiology  2. Atherosclerosis of native coronary artery of native heart with stable angina pectoris (HCC)  I25.118 aspirin EC 81 MG tablet  3. Acute combined systolic and diastolic heart failure (HCC)  I50.41 metoprolol tartrate (LOPRESSOR) 100 MG tablet  4. Polycythemia  D75.1 Ambulatory referral to Hematology    aspirin EC 81 MG tablet  5. Essential hypertension  I10 amLODipine (NORVASC) 10 MG tablet  6. Hypercholesteremia  E78.00 rosuvastatin (CRESTOR) 20 MG tablet    Meds ordered this encounter  Medications  . metoprolol tartrate  (LOPRESSOR) 100 MG tablet    Sig: Take 1 tablet (100 mg total) by mouth 2 (two) times daily.    Dispense:  60 tablet    Refill:  2  . amLODipine (NORVASC) 10 MG tablet    Sig: Take 1 tablet (10 mg total) by mouth daily.    Dispense:  90 tablet    Refill:  1  . aspirin EC 81 MG tablet    Sig: Take 1 tablet (81 mg total) by mouth daily.    Dispense:  90 tablet    Refill:  3  . rosuvastatin (CRESTOR) 20 MG tablet    Sig: Take 1 tablet (20 mg total) by mouth daily.    Dispense:  30 tablet    Refill:  2    Discontinue Pravastatin    Medications Discontinued During This Encounter  Medication Reason  . amLODipine (NORVASC) 10 MG tablet Reorder  . metoprolol tartrate (LOPRESSOR) 50 MG tablet   . pravastatin (PRAVACHOL) 40 MG tablet Change in therapy     Recommendations:   Danny Kelley  is a 54 y.o. male  with CAD, unstable angina pectoris on 11/26/2017, angioplasty to superdominant left circumflex coronary artery with 4.5 mm resolute DES in the midsegment, patent stent placed in Distal Cx and OM 1 in 2014, 03/22/2019, stenting to high-grade stenosis in the distal dominant Circumflex PDA branch. He has hypertension, hyperlipidemia, OSA on CPAP and compliant, tobacco use, quit in March 2017, h/o TIA in Feb 2018 while on anticoagulants, paroxysmal Atrial fib S/P ablation 11/13/2016 by Dr. Rayann Heman, (Multaq & Sotalol ineffective), now in permanent atrial fibrillation with no symptoms, ?seizure (pseudoseizure) disorder and presently on Leveracetam since Sept 2019, prior evaluation for syncope which I felt was psychogenic presents for evaluation of A. Fib and hypertensin and chest pain.    On his last office visit he had discontinued Bystolic due to insurance reasons and I had started him back on metoprolol and also due to rash on clonidine patch, was started on p.o. clonidine.  Underwent echocardiogram and presents for follow-up.  Blood pressure is much improved on clonidine tablets and also tolerating  metoprolol.  Will increase metoprolol from 25 mg to 50 mg twice daily for 1 week followed by increase it to 100 mg twice daily as tolerated, continue amlodipine for hypertension. I suspect his cardiomyopathy is probably related to uncontrolled hypertension and A. fib with RVR and hence as him having difficulty with rate control, and also due to new LV systolic dysfunction, I will refer him back to Dr. Rayann Heman.  He needs cardiac catheterization.  Do not think stress test would be appropriate.  This is in view of already established CAD and he had intermediate lesion in his proximal LAD as well.  Schedule him for left heart catheterization possible PCI.  Patient understands risks associated with cardiac catheterization as previously noted including less than 1% risk of death stroke and MI but not limited to this.  With regard to hyperlipidemia, previously well controlled on pravastatin, he had not tolerated atorvastatin but he is willing to try Crestor which he had also not tolerated previously.  We will start him on 20 mg daily.  I would like to see him back in 3 to 4 weeks for follow-up.  [name], MD, [facility]  01/04/2020, 9:18 PM [facility]. [place] Office: [phone]

## 2020-01-04 NOTE — Progress Notes (Signed)
Primary Physician/Referring:  [name], NP  Patient ID: Danny Kelley, male    DOB: 1966/05/20, 54 y.o.   MRN: [id number]  Chief Complaint  Patient presents with  . Coronary Artery Disease  . Atrial Fibrillation  . Follow-up    6 month   HPI:    Danny Kelley  is a 54 y.o. Caucasian male patient with h/o unstable angina pectoris on 11/26/2017, angioplasty to superdominant left circumflex coronary artery with 4.5 mm resolute DES in the midsegment, patent stent placed in Distal Cx and OM 1 in 2014, 03/22/2019, stenting to high-grade stenosis in the distal dominant Circumflex PDA branch. He has hypertension, hyperlipidemia, OSA on CPAP and compliant, tobacco use, quit in March 2017, h/o TIA in Feb 2018 while on anticoagulants, paroxysmal Atrial fib S/P ablation 11/13/2016 by Dr. Rayann Heman, now in permanent atrial fibrillation with no symptoms, seizure disorder and presently on Leveracetam since Sept 2019, prior evaluation for syncope which I felt was psychogenic presents for evaluation of A. Fib and hypertensin and chest pain.  He now has disability in place. He has gained about 25 Lbs.  This is a 2-week office visit.  He is now tolerating metoprolol and clonidine tablets.  He has noticed improvement with regard to his headaches, dyspnea since then.  States that his blood pressure is also improved but not still under control.  No PND or orthopnea.  Past Medical History:  Diagnosis Date  . Arthritis    "a little in my right knee" (07/14/2016)  . CAD (coronary artery disease)   . Chronic congestive heart failure with left ventricular diastolic dysfunction ([place])   . Complication of anesthesia    Pt reports "they have a hard time waking me up"  . GERD (gastroesophageal reflux disease)    hx  . Headache    "with every heart issue that I have" (07/14/2016)  . High cholesterol   . History of hiatal hernia 1990s   "fixed it w/scope down my throat"  . Hypertension   . Myocardial infarction  [facility]) 05/2013   stents: left PDA, 1st OM at [facility]  . Obesity   . Obstructive sleep apnea    previous test "inconclusive" per patient,  has appointment with [facility] Neurology 10/06/16 for evaluation  . Persistent atrial fibrillation ([place])   . Pneumonia ~ 1992  . QT prolongation 07/15/2016  . Seasonal allergies    "I take Allegra prn" (07/14/2016)  . Tobacco abuse 12/16/2016   Past Surgical History:  Procedure Laterality Date  . APPENDECTOMY  1984  . CARDIOVERSION N/A 05/27/2016   Procedure: CARDIOVERSION;  Surgeon: [name], MD;  Location: [facility] ENDOSCOPY;  Service: Cardiovascular;  Laterality: N/A;  . CARDIOVERSION N/A 06/11/2016   Procedure: CARDIOVERSION;  Surgeon: [name], MD;  Location: [place];  Service: Cardiovascular;  Laterality: N/A;  . CORONARY ANGIOPLASTY WITH STENT PLACEMENT  05/30/2013   "2 stents put in at [facility]" & /stent card  . CORONARY STENT INTERVENTION N/A 11/27/2017   Procedure: CORONARY STENT INTERVENTION;  Surgeon: [name], MD;  Location: [place] CV LAB;  Service: Cardiovascular;  Laterality: N/A;  . CORONARY STENT INTERVENTION N/A 03/21/2019   Procedure: CORONARY STENT INTERVENTION;  Surgeon: [name], MD;  Location: [place] CV LAB;  Service: Cardiovascular;  Laterality: N/A;  . ELECTROPHYSIOLOGIC STUDY N/A 11/13/2016   Procedure: Atrial Fibrillation Ablation;  Surgeon: [name], MD;  Location: [place] CV LAB;  Service: Cardiovascular;  Laterality: N/A;  .  ESOPHAGOGASTRODUODENOSCOPY (EGD) WITH ESOPHAGEAL DILATION  1990s X 2  . INTRAVASCULAR PRESSURE WIRE/FFR STUDY N/A 09/21/2018   Procedure: INTRAVASCULAR PRESSURE WIRE/FFR STUDY;  Surgeon: [name], MD;  Location: [place] CV LAB;  Service: Cardiovascular;  Laterality: N/A;  . INTRAVASCULAR PRESSURE WIRE/FFR STUDY N/A 03/21/2019   Procedure: INTRAVASCULAR PRESSURE WIRE/FFR STUDY;  Surgeon: [name], MD;  Location: [place] CV LAB;  Service: Cardiovascular;  Laterality:  N/A;  . KNEE ARTHROSCOPY Right 1986; ~ 1995 X 2  . LAPAROSCOPIC CHOLECYSTECTOMY  2015  . LEFT HEART CATH AND CORONARY ANGIOGRAPHY N/A 11/27/2017   Procedure: LEFT HEART CATH AND CORONARY ANGIOGRAPHY;  Surgeon: [name], MD;  Location: [place] CV LAB;  Service: Cardiovascular;  Laterality: N/A;  . LEFT HEART CATH AND CORONARY ANGIOGRAPHY N/A 09/21/2018   Procedure: LEFT HEART CATH AND CORONARY ANGIOGRAPHY;  Surgeon: [name], MD;  Location: [place] CV LAB;  Service: Cardiovascular;  Laterality: N/A;  . LEFT HEART CATH AND CORONARY ANGIOGRAPHY N/A 03/21/2019   Procedure: LEFT HEART CATH AND CORONARY ANGIOGRAPHY;  Surgeon: [name], MD;  Location: [place] CV LAB;  Service: Cardiovascular;  Laterality: N/A;  . REPAIR OF ESOPHAGUS  1998   perforation of the distal esophagus from bad heartburn  . TEE WITHOUT CARDIOVERSION N/A 11/13/2016   Procedure: TRANSESOPHAGEAL ECHOCARDIOGRAM (TEE);  Surgeon: Thayer Headings, MD;  Location: [facility] ENDOSCOPY;  Service: Cardiovascular;  Laterality: N/A;   Family History  Problem Relation Age of Onset  . Heart disease Mother 30       CABG age 20  . Breast cancer Mother   . Tongue cancer Mother   . Diabetes Mother   . Heart attack Father 77       Father had around 7 heart attacks per pt  . Diabetes Father   . Lung cancer Father   . Congestive Heart Failure Father   . Lung cancer Paternal Uncle     Social History   Tobacco Use  . Smoking status: Current Some Day Smoker    Years: 25.00    Types: Cigarettes, E-cigarettes    Last attempt to quit: 12/17/2016    Years since quitting: 3.0  . Smokeless tobacco: Never Used  . Tobacco comment: 5 cigs every 3 days  Substance Use Topics  . Alcohol use: No   ROS  Review of Systems  Constitution: Positive for malaise/fatigue and weight gain. Negative for decreased appetite and weight loss.  Eyes: Negative for visual disturbance.  Cardiovascular: Positive for dyspnea on exertion and leg swelling.  Negative for chest pain, orthopnea, palpitations and syncope.  Hematologic/Lymphatic: Does not bruise/bleed easily.  Skin: Negative for nail changes.  Musculoskeletal: Negative for muscle weakness and myalgias.  Gastrointestinal: Positive for diarrhea. Negative for abdominal pain and change in bowel habit.  Neurological: Negative for difficulty with concentration and headaches.  Psychiatric/Behavioral: Positive for depression. Negative for altered mental status and suicidal ideas. The patient is nervous/anxious.   All other systems reviewed and are negative.  Objective  Blood pressure (!) 136/100, pulse (!) 118, temperature (!) 96.5 F (35.8 C), temperature source Temporal, resp. rate 16, height 5\' 10"  (1.778 m), weight 255 lb (115.7 kg), SpO2 97 %.  Vitals with BMI 01/04/2020 01/04/2020 12/20/2019  Height 5\' 10"  5\' 10"  -  Weight 255 lbs 255 lbs -  BMI Q000111Q Q000111Q -  Systolic [id number] Q000111Q  Diastolic 123XX123 [id number] 95  Pulse 118 61 72     Physical Exam  Constitutional: Vital signs  are normal.  Moderately obese  Cardiovascular: Normal rate, regular rhythm, normal heart sounds and intact distal pulses.  Trace leg edema bilateral  Pulmonary/Chest: Effort normal and breath sounds normal. No accessory muscle usage. No respiratory distress.  Abdominal: Soft. Bowel sounds are normal.  Musculoskeletal:        General: Normal range of motion.  Vitals reviewed.  Laboratory examination:   Recent Labs    03/18/19 1040 03/21/19 0615 01/02/20 1445  NA 139 136 139  K 4.3 3.6 4.4  CL 102 103 98  CO2 30* 24 26  GLUCOSE 116* 110* 151*  BUN 15 13 12   CREATININE 0.93 0.92 0.88  CALCIUM 9.5 8.3* 9.4  GFRNONAA 95 >60 99  GFRAA 110 >60 115   estimated creatinine clearance is 126.6 mL/min (by C-G formula based on SCr of 0.88 mg/dL).  CMP Latest Ref Rng & Units 01/02/2020 03/21/2019 03/18/2019  Glucose 65 - 99 mg/dL 151(H) 110(H) 116(H)  BUN 6 - 24 mg/dL 12 13 15   Creatinine 0.76 - 1.27 mg/dL 0.88 0.92  0.93  Sodium 134 - 144 mmol/L 139 136 139  Potassium 3.5 - 5.2 mmol/L 4.4 3.6 4.3  Chloride 96 - 106 mmol/L 98 103 102  CO2 20 - 29 mmol/L 26 24 30(H)  Calcium 8.7 - 10.2 mg/dL 9.4 8.3(L) 9.5  Total Protein 6.0 - 8.5 g/dL 6.8 - -  Total Bilirubin 0.0 - 1.2 mg/dL 0.3 - -  Alkaline Phos 39 - 117 IU/L 147(H) - -  AST 0 - 40 IU/L 21 - -  ALT 0 - 44 IU/L 29 - -   CBC Latest Ref Rng & Units 01/02/2020 03/21/2019 03/18/2019  WBC 3.4 - 10.8 x10E3/uL 12.1(H) 10.4 10.4  Hemoglobin 13.0 - 17.7 g/dL 18.0(H) 16.5 17.2  Hematocrit 37.5 - 51.0 % 51.6(H) 48.1 49.1  Platelets 150 - 450 x10E3/uL 293 258 285   Lipid Panel     Component Value Date/Time   CHOL 239 (H) 01/02/2020 1445   TRIG 143 01/02/2020 1445   HDL 62 01/02/2020 1445   CHOLHDL 5.0 11/27/2017 0634   VLDL 16 11/27/2017 0634   LDLCALC 152 (H) 01/02/2020 1445   HEMOGLOBIN A1C Lab Results  Component Value Date   HGBA1C 6.0 (H) 11/27/2017   MPG 125.5 11/27/2017   TSH Recent Labs    01/02/20 1445  TSH 2.080   Medications and allergies   Allergies  Allergen Reactions  . Crestor [Rosuvastatin] Other (See Comments)    Severe leg cramps  . Dilaudid [Hydromorphone] Itching and Swelling  . Isosorbide     Muscle pain and cramps  . Lexapro [Escitalopram]     Have bad thoughts made anxiety worse   . Lipitor [Atorvastatin Calcium] Other (See Comments)    myalgias  . Erythromycin Other (See Comments)    Hallucinations   . Oxycodone Itching and Rash    Swelling to face, hands, mouth Completley intolerant to any meds with oxycodone in it      Current Outpatient Medications  Medication Instructions  . acetaminophen (TYLENOL) 500 mg, Oral, 2 times daily PRN  . amLODipine (NORVASC) 10 mg, Oral, Daily  . apixaban (ELIQUIS) 5 mg, Oral, 2 times daily  . aspirin EC 81 mg, Oral, Daily  . bisacodyl (DULCOLAX) 5 mg, Oral, Daily PRN  . cloNIDine (CATAPRES) 0.2 mg, Oral, 3 times daily  . docusate sodium (PX DOCUSATE SODIUM) 100 mg,  Oral, 2 times daily PRN  . ezetimibe (ZETIA) 10 mg, Oral, Daily  .  fexofenadine (ALLEGRA) 180 mg, Oral, Daily PRN  . metoprolol tartrate (LOPRESSOR) 100 mg, Oral, 2 times daily  . nitroGLYCERIN (NITROSTAT) 0.4 mg, Sublingual, Every 5 min x3 PRN  . rosuvastatin (CRESTOR) 20 mg, Oral, Daily  . shark liver oil-cocoa butter (PREPARATION H) 0.25-3-85.5 % suppository 1 suppository, Rectal, 2 times daily PRN  . torsemide (DEMADEX) 20 mg, Oral, As needed    Radiology:   No results found.  Cardiac Studies:   Sleep study X 2 negative   Coronary angiogram 03/21/2019:  No significant change in coronary anatomy since number of 2019.  Proximal LAD had diffusely diseased 60% stenosis, FFR 0.85, DFR 0.92.  Lesion not significant. Circumflex is dominant.  There was in-stent restenosis followed by tandem 90% stenosis in the PDA branch of circumflex (marked as OM 2 on the figure) S/P 2.5 x 24 mm Synergy DES, stenosis reduced to 0%.  TIMI-3 to TIMI-3 flow.  Mid circumflex stent widely patent. Nondominant RCA in the proximal segment has a 70 to 80% stenosis.  It gives origin to a very small PDA but in general appears to be nondominant.  Coronary angiogram 09/21/2018: Circumflex stent placed 11/27/2017, 4.5 x 18 mm resolute widely patent, patent 3.5 x 18 mm Xience DES to dominant distal left circumflex, 2.5 x 12 mm Xience to OM1 placed 05/30/2013. Ostial to proximal LAD 60% stenosis, FFR 0.81 and GFR 0.90, not physiologically significant. Mildly elevated LVEDP.  Event Monitor 30 days 07/28/2018: Paroxysmal episodes of atrial fibrillation with controlled ventricular response was noted along with PVCs. Some of this symptoms of chest pain, palpitations and dizziness correlated with atrial fibrillation, others showed normal sinus rhythm. No heart block, no other significant arrhythmias.  Direct current cardioversion ( Ist 05/27/16) 06/11/2016: 150x2, 200 x1 to NSR on Sotalol. A. Fib ablation by Dr. [name] on  11/13/2016  Echocardiogram 12/28/2019:  Severely depressed LV systolic function with visual EF 25-30%. Severe  global hypokinesis Left ventricle cavity is normal in size. Moderate left  ventricular hypertrophy. Doppler evidence of grade II diastolic  dysfunction, elevated LAP. Calculated EF 29%.  No significant valvular abnormalities.  Left atrial cavity is mildly dilated.  Right ventricle cavity is normal in size but visually low normal right  ventricular function.  The aortic root is dilated, 4.0cm at the level of sinus of Valsalva.  Proximal ascending aorta not well visualized.  Prior study dated 11/27/2017: Normal LVEF. Mild LVH. Normal diastolic  parameters. Mild LA enlargement.  EKG 01/04/2020: Atrial fibrillation with rapid ventricular response at the rate of 111 bpm, normal axis, diffuse ST-T wave abnormality, cannot exclude inferior ischemia, anterolateral ischemia.  Normal QT interval.   No significant change from  EKG 12/20/2019: Atrial fibrillation with RVR at rate of 121 bpm  EKG 03/31/2019: Atrial fibrillation controlled ventricular response at the rate of 70 bpm, normal axis.   Assessment     ICD-10-CM   1. Permanent atrial fibrillation ([place]). CHA2DS2-VASc Score is 5.  Yearly risk of stroke: 6.7% (HTN, Vasc Dz, CHF, TIA).     I48.21 EKG 12-Lead    metoprolol tartrate (LOPRESSOR) 100 MG tablet    Ambulatory referral to Cardiac Electrophysiology  2. Atherosclerosis of native coronary artery of native heart with stable angina pectoris (HCC)  I25.118 aspirin EC 81 MG tablet  3. Acute combined systolic and diastolic heart failure (HCC)  I50.41 metoprolol tartrate (LOPRESSOR) 100 MG tablet  4. Polycythemia  D75.1 Ambulatory referral to Hematology    aspirin EC 81 MG tablet  5. Essential hypertension  I10 amLODipine (NORVASC) 10 MG tablet  6. Hypercholesteremia  E78.00 rosuvastatin (CRESTOR) 20 MG tablet    Meds ordered this encounter  Medications  . metoprolol tartrate  (LOPRESSOR) 100 MG tablet    Sig: Take 1 tablet (100 mg total) by mouth 2 (two) times daily.    Dispense:  60 tablet    Refill:  2  . amLODipine (NORVASC) 10 MG tablet    Sig: Take 1 tablet (10 mg total) by mouth daily.    Dispense:  90 tablet    Refill:  1  . aspirin EC 81 MG tablet    Sig: Take 1 tablet (81 mg total) by mouth daily.    Dispense:  90 tablet    Refill:  3  . rosuvastatin (CRESTOR) 20 MG tablet    Sig: Take 1 tablet (20 mg total) by mouth daily.    Dispense:  30 tablet    Refill:  2    Discontinue Pravastatin    Medications Discontinued During This Encounter  Medication Reason  . amLODipine (NORVASC) 10 MG tablet Reorder  . metoprolol tartrate (LOPRESSOR) 50 MG tablet   . pravastatin (PRAVACHOL) 40 MG tablet Change in therapy     Recommendations:   Danny Kelley  is a 54 y.o. male  with CAD, unstable angina pectoris on 11/26/2017, angioplasty to superdominant left circumflex coronary artery with 4.5 mm resolute DES in the midsegment, patent stent placed in Distal Cx and OM 1 in 2014, 03/22/2019, stenting to high-grade stenosis in the distal dominant Circumflex PDA branch. He has hypertension, hyperlipidemia, OSA on CPAP and compliant, tobacco use, quit in March 2017, h/o TIA in Feb 2018 while on anticoagulants, paroxysmal Atrial fib S/P ablation 11/13/2016 by Dr. Rayann Heman, (Multaq & Sotalol ineffective), now in permanent atrial fibrillation with no symptoms, ?seizure (pseudoseizure) disorder and presently on Leveracetam since Sept 2019, prior evaluation for syncope which I felt was psychogenic presents for evaluation of A. Fib and hypertensin and chest pain.    On his last office visit he had discontinued Bystolic due to insurance reasons and I had started him back on metoprolol and also due to rash on clonidine patch, was started on p.o. clonidine.  Underwent echocardiogram and presents for follow-up.  Blood pressure is much improved on clonidine tablets and also tolerating  metoprolol.  Will increase metoprolol from 25 mg to 50 mg twice daily for 1 week followed by increase it to 100 mg twice daily as tolerated, continue amlodipine for hypertension. I suspect his cardiomyopathy is probably related to uncontrolled hypertension and A. fib with RVR and hence as him having difficulty with rate control, and also due to new LV systolic dysfunction, I will refer him back to Dr. Rayann Heman.  He needs cardiac catheterization.  Do not think stress test would be appropriate.  This is in view of already established CAD and he had intermediate lesion in his proximal LAD as well.  Schedule him for left heart catheterization possible PCI.  Patient understands risks associated with cardiac catheterization as previously noted including less than 1% risk of death stroke and MI but not limited to this.  With regard to hyperlipidemia, previously well controlled on pravastatin, he had not tolerated atorvastatin but he is willing to try Crestor which he had also not tolerated previously.  We will start him on 20 mg daily.  I would like to see him back in 3 to 4 weeks for follow-up.  [name], MD, [facility]  01/04/2020, 9:18 PM [facility]. [place] Office: [phone]

## 2020-01-04 NOTE — Patient Instructions (Signed)
Please take metoprolol 2 g twice a day for 1 week following which you will take 100 mg daily daily.  I have sent in for 100 mg prescriptions to the pharmacy.  Amlodipine has been refilled.  You will receive a consult from Dr. [name] and also from hematology to follow-up on atrial fibrillation and abnormal blood count.

## 2020-01-06 ENCOUNTER — Ambulatory Visit: Payer: BLUE CROSS/BLUE SHIELD | Admitting: Cardiology

## 2020-01-20 ENCOUNTER — Other Ambulatory Visit (HOSPITAL_COMMUNITY)
Admission: RE | Admit: 2020-01-20 | Discharge: 2020-01-20 | Disposition: A | Discharge status: Home / Self Care | Payer: Medicare Other | Source: Ambulatory Visit | Attending: Cardiology | Admitting: Cardiology

## 2020-01-20 DIAGNOSIS — Z01812 Encounter for preprocedural laboratory examination: Secondary | ICD-10-CM | POA: Insufficient documentation

## 2020-01-20 DIAGNOSIS — Z20822 Contact with and (suspected) exposure to covid-19: Secondary | ICD-10-CM | POA: Insufficient documentation

## 2020-01-20 LAB — SARS CORONAVIRUS 2 (TAT 6-24 HRS): SARS Coronavirus 2: NEGATIVE

## 2020-01-23 ENCOUNTER — Telehealth: Payer: Self-pay | Admitting: Physician Assistant

## 2020-01-23 NOTE — Telephone Encounter (Signed)
Received a new hem referral from Dr. Einar Gip for polcythemia. Pt has been scheduled to see Cassie on 3/31 at 1:30pm w/labs at 1pm. Pt awar to arrive 15 minutes early.

## 2020-01-24 ENCOUNTER — Ambulatory Visit (HOSPITAL_COMMUNITY)
Admission: RE | Admit: 2020-01-24 | Discharge: 2020-01-24 | Disposition: A | Discharge status: Home / Self Care | Payer: Medicare Other | Source: Ambulatory Visit | Attending: Cardiology | Admitting: Cardiology

## 2020-01-24 ENCOUNTER — Encounter (HOSPITAL_COMMUNITY)
Admission: RE | Disposition: A | Discharge status: Home / Self Care | Payer: Self-pay | Source: Ambulatory Visit | Attending: Cardiology

## 2020-01-24 ENCOUNTER — Other Ambulatory Visit: Payer: Self-pay

## 2020-01-24 DIAGNOSIS — I25118 Atherosclerotic heart disease of native coronary artery with other forms of angina pectoris: Secondary | ICD-10-CM | POA: Insufficient documentation

## 2020-01-24 DIAGNOSIS — Z8673 Personal history of transient ischemic attack (TIA), and cerebral infarction without residual deficits: Secondary | ICD-10-CM | POA: Diagnosis not present

## 2020-01-24 DIAGNOSIS — Z79899 Other long term (current) drug therapy: Secondary | ICD-10-CM | POA: Insufficient documentation

## 2020-01-24 DIAGNOSIS — I2510 Atherosclerotic heart disease of native coronary artery without angina pectoris: Secondary | ICD-10-CM | POA: Diagnosis present

## 2020-01-24 DIAGNOSIS — I4821 Permanent atrial fibrillation: Secondary | ICD-10-CM | POA: Diagnosis not present

## 2020-01-24 DIAGNOSIS — D751 Secondary polycythemia: Secondary | ICD-10-CM | POA: Diagnosis not present

## 2020-01-24 DIAGNOSIS — F17210 Nicotine dependence, cigarettes, uncomplicated: Secondary | ICD-10-CM | POA: Diagnosis not present

## 2020-01-24 DIAGNOSIS — I5041 Acute combined systolic (congestive) and diastolic (congestive) heart failure: Secondary | ICD-10-CM | POA: Insufficient documentation

## 2020-01-24 DIAGNOSIS — I272 Pulmonary hypertension, unspecified: Secondary | ICD-10-CM | POA: Diagnosis not present

## 2020-01-24 DIAGNOSIS — I110 Hypertensive heart disease with heart failure: Secondary | ICD-10-CM | POA: Diagnosis not present

## 2020-01-24 DIAGNOSIS — E7800 Pure hypercholesterolemia, unspecified: Secondary | ICD-10-CM | POA: Insufficient documentation

## 2020-01-24 DIAGNOSIS — I428 Other cardiomyopathies: Secondary | ICD-10-CM | POA: Insufficient documentation

## 2020-01-24 DIAGNOSIS — E785 Hyperlipidemia, unspecified: Secondary | ICD-10-CM | POA: Insufficient documentation

## 2020-01-24 DIAGNOSIS — Z7982 Long term (current) use of aspirin: Secondary | ICD-10-CM | POA: Diagnosis not present

## 2020-01-24 DIAGNOSIS — K219 Gastro-esophageal reflux disease without esophagitis: Secondary | ICD-10-CM | POA: Insufficient documentation

## 2020-01-24 DIAGNOSIS — I252 Old myocardial infarction: Secondary | ICD-10-CM | POA: Insufficient documentation

## 2020-01-24 DIAGNOSIS — Z955 Presence of coronary angioplasty implant and graft: Secondary | ICD-10-CM | POA: Insufficient documentation

## 2020-01-24 DIAGNOSIS — G4733 Obstructive sleep apnea (adult) (pediatric): Secondary | ICD-10-CM | POA: Insufficient documentation

## 2020-01-24 HISTORY — PX: RIGHT/LEFT HEART CATH AND CORONARY ANGIOGRAPHY: CATH118266

## 2020-01-24 HISTORY — PX: INTRAVASCULAR PRESSURE WIRE/FFR STUDY: CATH118243

## 2020-01-24 LAB — POCT I-STAT EG7
Acid-Base Excess: 1 mmol/L (ref 0.0–2.0)
Acid-Base Excess: 2 mmol/L (ref 0.0–2.0)
Bicarbonate: 27.4 mmol/L (ref 20.0–28.0)
Bicarbonate: 28 mmol/L (ref 20.0–28.0)
Calcium, Ion: 1.16 mmol/L (ref 1.15–1.40)
Calcium, Ion: 1.17 mmol/L (ref 1.15–1.40)
HCT: 48 % (ref 39.0–52.0)
HCT: 48 % (ref 39.0–52.0)
Hemoglobin: 16.3 g/dL (ref 13.0–17.0)
Hemoglobin: 16.3 g/dL (ref 13.0–17.0)
O2 Saturation: 71 %
O2 Saturation: 73 %
Potassium: 3.7 mmol/L (ref 3.5–5.1)
Potassium: 3.7 mmol/L (ref 3.5–5.1)
Sodium: 140 mmol/L (ref 135–145)
Sodium: 140 mmol/L (ref 135–145)
TCO2: 29 mmol/L (ref 22–32)
TCO2: 29 mmol/L (ref 22–32)
pCO2, Ven: 47.3 mmHg (ref 44.0–60.0)
pCO2, Ven: 48 mmHg (ref 44.0–60.0)
pH, Ven: 7.371 (ref 7.250–7.430)
pH, Ven: 7.373 (ref 7.250–7.430)
pO2, Ven: 39 mmHg (ref 32.0–45.0)
pO2, Ven: 40 mmHg (ref 32.0–45.0)

## 2020-01-24 LAB — POCT I-STAT 7, (LYTES, BLD GAS, ICA,H+H)
Bicarbonate: 26.4 mmol/L (ref 20.0–28.0)
Calcium, Ion: 1.18 mmol/L (ref 1.15–1.40)
HCT: 46 % (ref 39.0–52.0)
Hemoglobin: 15.6 g/dL (ref 13.0–17.0)
O2 Saturation: 98 %
Potassium: 3.6 mmol/L (ref 3.5–5.1)
Sodium: 139 mmol/L (ref 135–145)
TCO2: 28 mmol/L (ref 22–32)
pCO2 arterial: 45.6 mmHg (ref 32.0–48.0)
pH, Arterial: 7.369 (ref 7.350–7.450)
pO2, Arterial: 108 mmHg (ref 83.0–108.0)

## 2020-01-24 LAB — POCT ACTIVATED CLOTTING TIME
Activated Clotting Time: 235 seconds
Activated Clotting Time: 246 seconds

## 2020-01-24 SURGERY — RIGHT/LEFT HEART CATH AND CORONARY ANGIOGRAPHY
Anesthesia: LOCAL

## 2020-01-24 MED ORDER — ADENOSINE 12 MG/4ML IV SOLN
INTRAVENOUS | Status: AC
  Filled 2020-01-24: qty 16

## 2020-01-24 MED ORDER — LIDOCAINE HCL (PF) 1 % IJ SOLN
INTRAMUSCULAR | Status: AC
  Filled 2020-01-24: qty 30

## 2020-01-24 MED ORDER — ACETAMINOPHEN 325 MG PO TABS: 650.0000 mg | ORAL_TABLET | ORAL | Status: DC | PRN

## 2020-01-24 MED ORDER — FENTANYL CITRATE (PF) 100 MCG/2ML IJ SOLN
INTRAMUSCULAR | Status: AC
  Filled 2020-01-24: qty 2

## 2020-01-24 MED ORDER — ASPIRIN 81 MG PO CHEW
CHEWABLE_TABLET | ORAL | Status: AC
  Administered 2020-01-24: 12:00:00 81 mg
  Filled 2020-01-24: qty 1

## 2020-01-24 MED ORDER — HEPARIN SODIUM (PORCINE) 1000 UNIT/ML IJ SOLN
INTRAMUSCULAR | Status: DC | PRN
  Administered 2020-01-24: 5000 [IU] via INTRAVENOUS
  Administered 2020-01-24 (×2): 3000 [IU] via INTRAVENOUS

## 2020-01-24 MED ORDER — SODIUM CHLORIDE 0.9% FLUSH: 3.0000 mL | Freq: Two times a day (BID) | INTRAVENOUS | Status: DC

## 2020-01-24 MED ORDER — FENTANYL CITRATE (PF) 100 MCG/2ML IJ SOLN
INTRAMUSCULAR | Status: DC | PRN
  Administered 2020-01-24: 50 ug via INTRAVENOUS

## 2020-01-24 MED ORDER — SODIUM CHLORIDE 0.9 % WEIGHT BASED INFUSION: 1.0000 mL/kg/h | INTRAVENOUS | Status: DC

## 2020-01-24 MED ORDER — LABETALOL HCL 5 MG/ML IV SOLN
10.0000 mg | INTRAVENOUS | Status: DC | PRN
  Administered 2020-01-24: 10 mg via INTRAVENOUS
  Filled 2020-01-24: qty 4

## 2020-01-24 MED ORDER — SODIUM CHLORIDE 0.9 % IV SOLN: 250.0000 mL | INTRAVENOUS | Status: DC | PRN

## 2020-01-24 MED ORDER — LIDOCAINE HCL (PF) 1 % IJ SOLN
INTRAMUSCULAR | Status: DC | PRN
  Administered 2020-01-24 (×2): 2 mL via INTRADERMAL

## 2020-01-24 MED ORDER — MIDAZOLAM HCL 2 MG/2ML IJ SOLN
INTRAMUSCULAR | Status: AC
  Filled 2020-01-24: qty 2

## 2020-01-24 MED ORDER — ADENOSINE (DIAGNOSTIC) 140MCG/KG/MIN
INTRAVENOUS | Status: DC | PRN
  Administered 2020-01-24: 140 ug/kg/min via INTRAVENOUS

## 2020-01-24 MED ORDER — ONDANSETRON HCL 4 MG/2ML IJ SOLN: 4.0000 mg | Freq: Four times a day (QID) | INTRAMUSCULAR | Status: DC | PRN

## 2020-01-24 MED ORDER — VERAPAMIL HCL 2.5 MG/ML IV SOLN
INTRAVENOUS | Status: DC | PRN
  Administered 2020-01-24: 10 mL via INTRA_ARTERIAL

## 2020-01-24 MED ORDER — HEPARIN (PORCINE) IN NACL 1000-0.9 UT/500ML-% IV SOLN
INTRAVENOUS | Status: DC | PRN
  Administered 2020-01-24 (×3): 500 mL

## 2020-01-24 MED ORDER — HEPARIN SODIUM (PORCINE) 1000 UNIT/ML IJ SOLN
INTRAMUSCULAR | Status: AC
  Filled 2020-01-24: qty 1

## 2020-01-24 MED ORDER — IOHEXOL 350 MG/ML SOLN
INTRAVENOUS | Status: DC | PRN
  Administered 2020-01-24: 95 mL

## 2020-01-24 MED ORDER — VERAPAMIL HCL 2.5 MG/ML IV SOLN
INTRAVENOUS | Status: AC
  Filled 2020-01-24: qty 2

## 2020-01-24 MED ORDER — ASPIRIN 81 MG PO CHEW: 81.0000 mg | CHEWABLE_TABLET | ORAL | Status: DC

## 2020-01-24 MED ORDER — MIDAZOLAM HCL 2 MG/2ML IJ SOLN
INTRAMUSCULAR | Status: DC | PRN
  Administered 2020-01-24: 2 mg via INTRAVENOUS
  Administered 2020-01-24: 1 mg via INTRAVENOUS

## 2020-01-24 MED ORDER — SODIUM CHLORIDE 0.9% FLUSH: 3.0000 mL | INTRAVENOUS | Status: DC | PRN

## 2020-01-24 MED ORDER — HEPARIN (PORCINE) IN NACL 1000-0.9 UT/500ML-% IV SOLN
INTRAVENOUS | Status: AC
  Filled 2020-01-24: qty 1000

## 2020-01-24 MED ORDER — SODIUM CHLORIDE 0.9 % WEIGHT BASED INFUSION: 3.0000 mL/kg/h | INTRAVENOUS | Status: DC

## 2020-01-24 SURGICAL SUPPLY — 15 items
CATH BALLN WEDGE 5F 110CM (CATHETERS) ×1 IMPLANT
CATH OPTITORQUE TIG 4.0 5F (CATHETERS) ×1 IMPLANT
CATH VISTA GUIDE 6FR XBLAD3.5 (CATHETERS) ×1 IMPLANT
DEVICE RAD COMP TR BAND LRG (VASCULAR PRODUCTS) ×1 IMPLANT
GLIDESHEATH SLEND A-KIT 6F 22G (SHEATH) ×1 IMPLANT
GUIDEWIRE INQWIRE 1.5J.035X260 (WIRE) IMPLANT
GUIDEWIRE PRESSURE COMET II (WIRE) ×1 IMPLANT
INQWIRE 1.5J .035X260CM (WIRE) ×2
KIT ESSENTIALS PG (KITS) ×1 IMPLANT
KIT HEART LEFT (KITS) ×2 IMPLANT
PACK CARDIAC CATHETERIZATION (CUSTOM PROCEDURE TRAY) ×2 IMPLANT
SHEATH GLIDE SLENDER 4/5FR (SHEATH) ×1 IMPLANT
SHEATH PROBE COVER 6X72 (BAG) ×1 IMPLANT
TRANSDUCER W/STOPCOCK (MISCELLANEOUS) ×2 IMPLANT
TUBING CIL FLEX 10 FLL-RA (TUBING) ×2 IMPLANT

## 2020-01-24 NOTE — Interval H&P Note (Signed)
History and Physical Interval Note:  01/24/2020 2:50 PM  Danny Kelley  has presented today for surgery, with the diagnosis of CAD.  The various methods of treatment have been discussed with the patient and family. After consideration of risks, benefits and other options for treatment, the patient has consented to  Procedure(s): RIGHT/LEFT HEART CATH AND CORONARY ANGIOGRAPHY (N/A) and possible angioplastyas a surgical intervention.  The patient's history has been reviewed, patient examined, no change in status, stable for surgery.  I have reviewed the patient's chart and labs.  Questions were answered to the patient's satisfaction.     [name]

## 2020-01-24 NOTE — Progress Notes (Signed)
Notified Dr. Einar Gip of BP 148/109.  Per Dr. Einar Gip I should give Labetalol as ordered.

## 2020-01-24 NOTE — Discharge Instructions (Signed)
Moderate Conscious Sedation, Adult, Care After These instructions provide you with information about caring for yourself after your procedure. Your health care provider may also give you more specific instructions. Your treatment has been planned according to current medical practices, but problems sometimes occur. Call your health care provider if you have any problems or questions after your procedure. What can I expect after the procedure? After your procedure, it is common:  To feel sleepy for several hours.  To feel clumsy and have poor balance for several hours.  To have poor judgment for several hours.  To vomit if you eat too soon. Follow these instructions at home: For at least 24 hours after the procedure:   Do not: ? Participate in activities where you could fall or become injured. ? Drive. ? Use heavy machinery. ? Drink alcohol. ? Take sleeping pills or medicines that cause drowsiness. ? Make important decisions or sign legal documents. ? Take care of children on your own.  Rest. Eating and drinking  Follow the diet recommended by your health care provider.  If you vomit: ? Drink water, juice, or soup when you can drink without vomiting. ? Make sure you have little or no nausea before eating solid foods. General instructions  Have a responsible adult stay with you until you are awake and alert.  Take over-the-counter and prescription medicines only as told by your health care provider.  If you smoke, do not smoke without supervision.  Keep all follow-up visits as told by your health care provider. This is important. Contact a health care provider if:  You keep feeling nauseous or you keep vomiting.  You feel light-headed.  You develop a rash.  You have a fever. Get help right away if:  You have trouble breathing. This information is not intended to replace advice given to you by your health care provider. Make sure you discuss any questions you have  with your health care provider. Document Revised: 10/02/2017 Document Reviewed: 02/09/2016 Elsevier Patient Education  [place] PLENTY OF FLUIDS FOR FEW DAYS.  KEEP RIGHT ARM ELEVATED ABOVE LEVEL OF HEART Radial Site Care  This sheet gives you information about how to care for yourself after your procedure. Your health care provider may also give you more specific instructions. If you have problems or questions, contact your health care provider. What can I expect after the procedure? After the procedure, it is common to have:  Bruising and tenderness at the catheter insertion area. Follow these instructions at home: Medicines  Take over-the-counter and prescription medicines only as told by your health care provider. Insertion site care  Follow instructions from your health care provider about how to take care of your insertion site. Make sure you: ? Wash your hands with soap and water before you change your bandage (dressing). If soap and water are not available, use hand sanitizer. ? Change your dressing as told by your health care provider. ? Leave stitches (sutures), skin glue, or adhesive strips in place. These skin closures may need to stay in place for 2 weeks or longer. If adhesive strip edges start to loosen and curl up, you may trim the loose edges. Do not remove adhesive strips completely unless your health care provider tells you to do that.  Check your insertion site every day for signs of infection. Check for: ? Redness, swelling, or pain. ? Fluid or blood. ? Pus or a bad smell. ? Warmth.  Do not take baths, swim,  or use a hot tub until your health care provider approves.  You may shower 24-48 hours after the procedure, or as directed by your health care provider. ? Remove the dressing and gently wash the site with plain soap and water. ? Pat the area dry with a clean towel. ? Do not rub the site. That could cause bleeding.  Do not apply powder or  lotion to the site. Activity   For 24 hours after the procedure, or as directed by your health care provider: ? Do not flex or bend the affected arm. ? Do not push or pull heavy objects with the affected arm. ? Do not drive yourself home from the hospital or clinic. You may drive 24 hours after the procedure unless your health care provider tells you not to. ? Do not operate machinery or power tools.  Do not lift anything that is heavier than 10 lb (4.5 kg), or the limit that you are told, until your health care provider says that it is safe.  Ask your health care provider when it is okay to: ? Return to work or school. ? Resume usual physical activities or sports. ? Resume sexual activity. General instructions  If the catheter site starts to bleed, raise your arm and put firm pressure on the site. If the bleeding does not stop, get help right away. This is a medical emergency.  If you went home on the same day as your procedure, a responsible adult should be with you for the first 24 hours after you arrive home.  Keep all follow-up visits as told by your health care provider. This is important. Contact a health care provider if:  You have a fever.  You have redness, swelling, or yellow drainage around your insertion site. Get help right away if:  You have unusual pain at the radial site.  The catheter insertion area swells very fast.  The insertion area is bleeding, and the bleeding does not stop when you hold steady pressure on the area.  Your arm or hand becomes pale, cool, tingly, or numb. These symptoms may represent a serious problem that is an emergency. Do not wait to see if the symptoms will go away. Get medical help right away. Call your local emergency services (911 in the [place] Do not drive yourself to the hospital. Summary  After the procedure, it is common to have bruising and tenderness at the site.  Follow instructions from your health care provider about  how to take care of your radial site wound. Check the wound every day for signs of infection.  Do not lift anything that is heavier than 10 lb (4.5 kg), or the limit that you are told, until your health care provider says that it is safe. This information is not intended to replace advice given to you by your health care provider. Make sure you discuss any questions you have with your health care provider. Document Revised: 11/25/2017 Document Reviewed: 11/25/2017 Elsevier Patient Education  2020 [employer].

## 2020-01-25 ENCOUNTER — Other Ambulatory Visit: Payer: Medicare Other

## 2020-01-27 ENCOUNTER — Ambulatory Visit: Payer: BLUE CROSS/BLUE SHIELD | Admitting: Cardiology

## 2020-02-01 ENCOUNTER — Other Ambulatory Visit: Payer: Self-pay | Admitting: Physician Assistant

## 2020-02-01 ENCOUNTER — Inpatient Hospital Stay: Payer: Medicare Other | Attending: Physician Assistant | Admitting: Physician Assistant

## 2020-02-01 ENCOUNTER — Other Ambulatory Visit: Payer: Self-pay

## 2020-02-01 ENCOUNTER — Inpatient Hospital Stay: Payer: Medicare Other

## 2020-02-01 ENCOUNTER — Encounter: Payer: Self-pay | Admitting: Physician Assistant

## 2020-02-01 VITALS — BP 145/98 | HR 99 | Temp 98.5°F | Resp 20 | Ht 70.0 in | Wt 256.0 lb

## 2020-02-01 DIAGNOSIS — Z7901 Long term (current) use of anticoagulants: Secondary | ICD-10-CM | POA: Diagnosis not present

## 2020-02-01 DIAGNOSIS — Z8249 Family history of ischemic heart disease and other diseases of the circulatory system: Secondary | ICD-10-CM | POA: Diagnosis not present

## 2020-02-01 DIAGNOSIS — F17210 Nicotine dependence, cigarettes, uncomplicated: Secondary | ICD-10-CM | POA: Diagnosis not present

## 2020-02-01 DIAGNOSIS — I10 Essential (primary) hypertension: Secondary | ICD-10-CM

## 2020-02-01 DIAGNOSIS — Z803 Family history of malignant neoplasm of breast: Secondary | ICD-10-CM | POA: Diagnosis not present

## 2020-02-01 DIAGNOSIS — J449 Chronic obstructive pulmonary disease, unspecified: Secondary | ICD-10-CM | POA: Diagnosis not present

## 2020-02-01 DIAGNOSIS — Z7982 Long term (current) use of aspirin: Secondary | ICD-10-CM | POA: Diagnosis not present

## 2020-02-01 DIAGNOSIS — R718 Other abnormality of red blood cells: Secondary | ICD-10-CM | POA: Insufficient documentation

## 2020-02-01 DIAGNOSIS — Z801 Family history of malignant neoplasm of trachea, bronchus and lung: Secondary | ICD-10-CM | POA: Diagnosis not present

## 2020-02-01 DIAGNOSIS — I509 Heart failure, unspecified: Secondary | ICD-10-CM | POA: Insufficient documentation

## 2020-02-01 DIAGNOSIS — I4821 Permanent atrial fibrillation: Secondary | ICD-10-CM | POA: Diagnosis not present

## 2020-02-01 DIAGNOSIS — I252 Old myocardial infarction: Secondary | ICD-10-CM | POA: Diagnosis not present

## 2020-02-01 DIAGNOSIS — G4733 Obstructive sleep apnea (adult) (pediatric): Secondary | ICD-10-CM | POA: Insufficient documentation

## 2020-02-01 DIAGNOSIS — Z833 Family history of diabetes mellitus: Secondary | ICD-10-CM | POA: Insufficient documentation

## 2020-02-01 DIAGNOSIS — Z79899 Other long term (current) drug therapy: Secondary | ICD-10-CM | POA: Diagnosis not present

## 2020-02-01 DIAGNOSIS — D751 Secondary polycythemia: Secondary | ICD-10-CM | POA: Insufficient documentation

## 2020-02-01 DIAGNOSIS — I110 Hypertensive heart disease with heart failure: Secondary | ICD-10-CM | POA: Insufficient documentation

## 2020-02-01 LAB — CMP (CANCER CENTER ONLY)
ALT: 27 U/L (ref 0–44)
AST: 18 U/L (ref 15–41)
Albumin: 3.1 g/dL — ABNORMAL LOW (ref 3.5–5.0)
Alkaline Phosphatase: 140 U/L — ABNORMAL HIGH (ref 38–126)
Anion gap: 9 (ref 5–15)
BUN: 12 mg/dL (ref 6–20)
CO2: 23 mmol/L (ref 22–32)
Calcium: 8.8 mg/dL — ABNORMAL LOW (ref 8.9–10.3)
Chloride: 105 mmol/L (ref 98–111)
Creatinine: 0.79 mg/dL (ref 0.61–1.24)
GFR, Est AFR Am: >60 mL/min (ref >60)
GFR, Estimated: >60 mL/min (ref >60)
Glucose, Bld: 138 mg/dL — ABNORMAL HIGH (ref 70–99)
Potassium: 3.8 mmol/L (ref 3.5–5.1)
Sodium: 137 mmol/L (ref 135–145)
Total Bilirubin: 0.4 mg/dL (ref 0.3–1.2)
Total Protein: 7.4 g/dL (ref 6.5–8.1)

## 2020-02-01 LAB — CBC WITH DIFFERENTIAL (CANCER CENTER ONLY)
Abs Immature Granulocytes: 0.04 10*3/uL (ref 0.00–0.07)
Basophils Absolute: 0.1 10*3/uL (ref 0.0–0.1)
Basophils Relative: 1 %
Eosinophils Absolute: 0.2 10*3/uL (ref 0.0–0.5)
Eosinophils Relative: 2 %
HCT: 51.5 % (ref 39.0–52.0)
Hemoglobin: 17.6 g/dL — ABNORMAL HIGH (ref 13.0–17.0)
Immature Granulocytes: 0 %
Lymphocytes Relative: 30 %
Lymphs Abs: 2.9 10*3/uL (ref 0.7–4.0)
MCH: 28.6 pg (ref 26.0–34.0)
MCHC: 34.2 g/dL (ref 30.0–36.0)
MCV: 83.6 fL (ref 80.0–100.0)
Monocytes Absolute: 0.8 10*3/uL (ref 0.1–1.0)
Monocytes Relative: 8 %
Neutro Abs: 5.7 10*3/uL (ref 1.7–7.7)
Neutrophils Relative %: 59 %
Platelet Count: 259 10*3/uL (ref 150–400)
RBC: 6.16 MIL/uL — ABNORMAL HIGH (ref 4.22–5.81)
RDW: 13.3 % (ref 11.5–15.5)
WBC Count: 9.6 10*3/uL (ref 4.0–10.5)
nRBC: 0 % (ref 0.0–0.2)

## 2020-02-01 LAB — IRON AND TIBC
Iron: 106 ug/dL (ref 42–163)
Saturation Ratios: 24 % (ref 20–55)
TIBC: 446 ug/dL — ABNORMAL HIGH (ref 202–409)
UIBC: 341 ug/dL (ref 117–376)

## 2020-02-01 LAB — LACTATE DEHYDROGENASE: LDH: 181 U/L (ref 98–192)

## 2020-02-01 LAB — FERRITIN: Ferritin: 81 ng/mL (ref 24–336)

## 2020-02-01 NOTE — Progress Notes (Signed)
[place] [phone]   [phone]  CONSULT NOTE  REFERRING PHYSICIAN: Dr. Einar Gip   REASON FOR CONSULTATION:  Polycythemia   HPI Danny Kelley is a 54 y.o. male with a past medical history with CAD, HTN, unstable angina, permanent atrial fibrillation, tobacco use, and congestive heart failure is referred to the clinic for evaluation of polycythemia.  This was first noted on routine labs performed at the patient's cardiology appointment on 01/02/2020.  The patient is followed closely by cardiology for atrial fibrillation, congestive heart failure, and coronary artery disease. At that encounter, his RBCs were elevated at 6.18, his hematocrit was elevated at 18 and his hematocrit was slightly elevated at 51.6.  The patient was started on a baby aspirin 81 mg and referred to the clinic today for further evaluation and work-up regarding his condition.  The patient states that his cardiologist discontinued his Lasix approximately 3 or 4 weeks ago.   Overall, the patient is feeling fairly well except for shortness of breath and fatigue likely related to his heart condition.  The patient denies any recent fever, chills, night sweats, or significant weight loss.  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  He has had extensive testing performed for this concern in the past.  He denies any significant headaches or visual changes.  He denies any nausea, constipation, abdominal pain/or abdominal fullness.  He denies volume depletion with recent vomiting or diarrhea. He has a rash on his right forearm which developed approximately 2 days after he had a right radial heart catheterization.  His right radial catheterization was last week.  The patient has a history of multiple allergic hypersensitivities.  He denies any new soaps or detergents but may have had new exposures/cleansing solutions during this procedure.  The area is  raised and itchy and the rash blanches.  No other significant itching or rashes noted.  Patient denies any bleeding or bruising including epistaxis, gingival bleeding, hematochezia, hemoptysis, no nausea, hematuria, or hematochezia. He denies any steroid or hormone replacement..  The patient was tested in the past x2 for obstructive sleep apnea.  His sleep study was reportedly normal.  He denies any underlying lung disease; however, the patient is a current smoker.  He smokes approximately 7 to 10 cigarettes/day.  The patient's family history significant for for heart disease.  The patient's mother had heart disease and breast cancer.  The patient's father also had heart disease.  The patient denies any family history of any hematologic disorders.  The patient is on disability.  He is married and has 2 children.  He denies any drug or alcohol abuse.  As mentioned previously, he smokes approximately 7 to 10 cigarettes/day and is currently trying to quit.  HPI  Past Medical History:  Diagnosis Date   Arthritis    "a little in my right knee" (07/14/2016)   CAD (coronary artery disease)    Chronic congestive heart failure with left ventricular diastolic dysfunction (HCC)    Complication of anesthesia    Pt reports "they have a hard time waking me up"   GERD (gastroesophageal reflux disease)    hx   Headache    "with every heart issue that I have" (07/14/2016)   High cholesterol    History of hiatal hernia 1990s   "fixed it w/scope down my throat"   Hypertension    Myocardial infarction ([place]) 05/2013   stents: left PDA, 1st OM  at [facility]   Obesity    Obstructive sleep apnea    previous test "inconclusive" per patient,  has appointment with [facility] 10/06/16 for evaluation   Persistent atrial fibrillation ([place])    Pneumonia ~ 1992   QT prolongation 07/15/2016   Seasonal allergies    "I take Allegra prn" (07/14/2016)   Tobacco abuse 12/16/2016    Past  Surgical History:  Procedure Laterality Date   APPENDECTOMY  1984   CARDIOVERSION N/A 05/27/2016   Procedure: CARDIOVERSION;  Surgeon: [name], MD;  Location: [place];  Service: Cardiovascular;  Laterality: N/A;   CARDIOVERSION N/A 06/11/2016   Procedure: CARDIOVERSION;  Surgeon: [name], MD;  Location: [place];  Service: Cardiovascular;  Laterality: N/A;   CORONARY ANGIOPLASTY WITH STENT PLACEMENT  05/30/2013   "2 stents put in at [facility]" & /stent card   CORONARY STENT INTERVENTION N/A 11/27/2017   Procedure: CORONARY STENT INTERVENTION;  Surgeon: [name], MD;  Location: [place] CV LAB;  Service: Cardiovascular;  Laterality: N/A;   CORONARY STENT INTERVENTION N/A 03/21/2019   Procedure: CORONARY STENT INTERVENTION;  Surgeon: [name], MD;  Location: [place] [facility];  Service: Cardiovascular;  Laterality: N/A;   ELECTROPHYSIOLOGIC STUDY N/A 11/13/2016   Procedure: Atrial Fibrillation Ablation;  Surgeon: [name], MD;  Location: [place] CV [facility];  Service: Cardiovascular;  Laterality: N/A;   ESOPHAGOGASTRODUODENOSCOPY (EGD) WITH ESOPHAGEAL DILATION  1990s X 2   INTRAVASCULAR PRESSURE WIRE/FFR STUDY N/A 09/21/2018   Procedure: INTRAVASCULAR PRESSURE WIRE/FFR STUDY;  Surgeon: [name], MD;  Location: [place] [facility];  Service: Cardiovascular;  Laterality: N/A;   INTRAVASCULAR PRESSURE WIRE/FFR STUDY N/A 03/21/2019   Procedure: INTRAVASCULAR PRESSURE WIRE/FFR STUDY;  Surgeon: [name], MD;  Location: [place] [facility];  Service: Cardiovascular;  Laterality: N/A;   INTRAVASCULAR PRESSURE WIRE/FFR STUDY N/A 01/24/2020   Procedure: INTRAVASCULAR PRESSURE WIRE/FFR STUDY;  Surgeon: [name], MD;  Location: [place] CV [facility];  Service: Cardiovascular;  Laterality: N/A;   KNEE ARTHROSCOPY Right 1986; ~ 1995 X [place]  2015   LEFT HEART CATH AND CORONARY ANGIOGRAPHY N/A 11/27/2017   Procedure: LEFT HEART CATH AND CORONARY  ANGIOGRAPHY;  Surgeon: [name], MD;  Location: [place] [facility];  Service: Cardiovascular;  Laterality: N/A;   LEFT HEART CATH AND CORONARY ANGIOGRAPHY N/A 09/21/2018   Procedure: LEFT HEART CATH AND CORONARY ANGIOGRAPHY;  Surgeon: [name], MD;  Location: [place] CV [facility];  Service: Cardiovascular;  Laterality: N/A;   LEFT HEART CATH AND CORONARY ANGIOGRAPHY N/A 03/21/2019   Procedure: LEFT HEART CATH AND CORONARY ANGIOGRAPHY;  Surgeon: [name], MD;  Location: [place] [facility];  Service: Cardiovascular;  Laterality: N/A;   REPAIR OF ESOPHAGUS  1998   perforation of the distal esophagus from bad heartburn   RIGHT/LEFT HEART CATH AND CORONARY ANGIOGRAPHY N/A 01/24/2020   Procedure: RIGHT/LEFT HEART CATH AND CORONARY ANGIOGRAPHY;  Surgeon: [name], MD;  Location: [place] [facility];  Service: Cardiovascular;  Laterality: N/A;   TEE WITHOUT CARDIOVERSION N/A 11/13/2016   Procedure: TRANSESOPHAGEAL ECHOCARDIOGRAM (TEE);  Surgeon: Thayer Headings, MD;  Location: [facility] ENDOSCOPY;  Service: Cardiovascular;  Laterality: N/A;    Family History  Problem Relation Age of Onset   Heart disease Mother 63       CABG age 71   Breast cancer Mother    Tongue cancer Mother    Diabetes Mother    Heart attack Father  18       Father had around 7 heart attacks per pt   Diabetes Father    Lung cancer Father    Congestive Heart Failure Father    Lung cancer Paternal Uncle     Social History Social History   Tobacco Use   Smoking status: Current Some Day Smoker    Years: 25.00    Types: Cigarettes, E-cigarettes    Last attempt to quit: 12/17/2016    Years since quitting: 3.1   Smokeless tobacco: Never Used   Tobacco comment: 5 cigs every 3 days  Substance Use Topics   Alcohol use: No   Drug use: No    Allergies  Allergen Reactions   Crestor [Rosuvastatin] Other (See Comments)    Severe leg cramps   Dilaudid [Hydromorphone] Itching and Swelling   Isosorbide      Muscle pain and cramps   Lexapro [Escitalopram]     Have bad thoughts made anxiety worse    Lipitor [Atorvastatin Calcium] Other (See Comments)    myalgias   Erythromycin Other (See Comments)    Hallucinations    Oxycodone Itching and Rash    Swelling to face, hands, mouth Completley intolerant to any meds with oxycodone in it     Current Outpatient Medications  Medication Sig Dispense Refill   acetaminophen (TYLENOL) 500 MG tablet Take 500 mg by mouth 2 (two) times daily as needed for moderate pain.     amLODipine (NORVASC) 10 MG tablet Take 1 tablet (10 mg total) by mouth daily. 90 tablet 1   apixaban (ELIQUIS) 5 MG TABS tablet Take 1 tablet (5 mg total) by mouth 2 (two) times daily. 180 tablet 3   aspirin EC 81 MG tablet Take 1 tablet (81 mg total) by mouth daily. 90 tablet 3   bisacodyl (DULCOLAX) 5 MG EC tablet Take 5 mg by mouth daily as needed.      cloNIDine (CATAPRES) 0.2 MG tablet Take 1 tablet (0.2 mg total) by mouth 3 (three) times daily. 90 tablet 1   docusate sodium (PX DOCUSATE SODIUM) 100 MG capsule Take 100 mg by mouth 2 (two) times daily as needed.      ezetimibe (ZETIA) 10 MG tablet Take 1 tablet (10 mg total) by mouth daily. 90 tablet 0   fexofenadine (ALLEGRA) 180 MG tablet Take 180 mg by mouth daily as needed (seasonal allergies).      metoprolol tartrate (LOPRESSOR) 100 MG tablet Take 1 tablet (100 mg total) by mouth 2 (two) times daily. 60 tablet 2   nitroGLYCERIN (NITROSTAT) 0.4 MG SL tablet Place 1 tablet (0.4 mg total) under the tongue every 5 (five) minutes x 3 doses as needed for chest pain. 25 tablet 2   rosuvastatin (CRESTOR) 20 MG tablet Take 1 tablet (20 mg total) by mouth daily. 30 tablet 2   shark liver oil-cocoa butter (PREPARATION H) 0.25-3-85.5 % suppository Place 1 suppository rectally 2 (two) times daily as needed for hemorrhoids.     torsemide (DEMADEX) 20 MG tablet Take 20 mg by mouth as needed.      No current  facility-administered medications for this visit.    Review of Systems  REVIEW OF SYSTEMS:   Review of Systems  Constitutional: Positive for fatigue. Negative for appetite change, chills, fever and unexpected weight change.  HENT: Negative for mouth sores, nosebleeds, sore throat and trouble swallowing.   Eyes: Negative for eye problems and icterus.  Respiratory:Positive for baseline dyspnea on exertion. Negative for  cough, hemoptysis, and wheezing.  Cardiovascular: Negative for chest pain and leg swelling.  Gastrointestinal: Negative for abdominal pain, constipation, diarrhea, nausea and vomiting.  Genitourinary: Negative for bladder incontinence, difficulty urinating, dysuria, frequency and hematuria.   Musculoskeletal: Negative for back pain, gait problem, neck pain and neck stiffness.  Skin: Negative for itching and rash.  Neurological: Negative for dizziness, extremity weakness, gait problem, headaches, light-headedness and seizures.  Hematological: Negative for adenopathy. Does not bruise/bleed easily.  Psychiatric/Behavioral: Negative for confusion, depression and sleep disturbance. The patient is not nervous/anxious.     PHYSICAL EXAMINATION:  Blood pressure (!) 145/98, pulse 99, temperature 98.5 F (36.9 C), temperature source Temporal, resp. rate 20, height 5\' 10"  (1.778 m), weight 256 lb (116.1 kg), SpO2 99 %.  ECOG PERFORMANCE STATUS: 0  Physical Exam  Constitutional: Oriented to person, place, and time and well-developed, well-nourished, and in no distress.  HENT:  Head: Normocephalic and atraumatic.  Mouth/Throat: Oropharynx is clear and moist. No oropharyngeal exudate.  Eyes: Conjunctivae are normal. Right eye exhibits no discharge. Left eye exhibits no discharge. No scleral icterus.  Neck: Normal range of motion. Neck supple.  Cardiovascular: Normal rate, irregular rhythm, normal heart sounds and intact distal pulses.   Pulmonary/Chest: Effort normal and breath  sounds normal. No respiratory distress. No wheezes. No rales.  Abdominal: Soft. Bowel sounds are normal. Exhibits no distension and no mass. There is no tenderness.  Musculoskeletal: Normal range of motion. Exhibits no edema.  Lymphadenopathy:    No cervical adenopathy.  Neurological: Alert and oriented to person, place, and time. Exhibits normal muscle tone. Gait normal. Coordination normal.  Skin: Skin is warm and dry. No rash noted. Not diaphoretic. No erythema. No pallor.  Psychiatric: Mood, memory and judgment normal.  Vitals reviewed.  LABORATORY DATA: Lab Results  Component Value Date   WBC 9.6 02/01/2020   HGB 17.6 (H) 02/01/2020   HCT 51.5 02/01/2020   MCV 83.6 02/01/2020   PLT 259 02/01/2020      Chemistry      Component Value Date/Time   NA 137 02/01/2020 1301   NA 139 01/02/2020 1445   K 3.8 02/01/2020 1301   CL 105 02/01/2020 1301   CO2 23 02/01/2020 1301   BUN 12 02/01/2020 1301   BUN 12 01/02/2020 1445   CREATININE 0.79 02/01/2020 1301   CREATININE 0.79 10/29/2016 1108      Component Value Date/Time   CALCIUM 8.8 (L) 02/01/2020 1301   ALKPHOS 140 (H) 02/01/2020 1301   AST 18 02/01/2020 1301   ALT 27 02/01/2020 1301   BILITOT 0.4 02/01/2020 1301       RADIOGRAPHIC STUDIES: CARDIAC CATHETERIZATION  Result Date: 01/25/2020 Left and right heart catheterization 01/24/2020: Right heart: RA 18/18, mean 17; RV 44/10, EDP 15; PA 48/19, mean 33.  PA saturation 72%.  PW 25/26, mean 23 mmHg.  Aortic saturation 98%. Cardiac output 4.73, cardiac index 2.09, reduced.  QP/QS 1.00. Impression: Mild decrease in cardiac output and cardiac index.  Mild to moderate pulmonary hypertension related to elevated LVEDP. Left heart catheterization: LV pressure 139/17, EDP 31 mmHg.  Aortic pressure 144/97, mean 122 mmHg.  No pressure gradient. Right coronary artery is nondominant but a large vessel with a proximal 70% stenosis unchanged from cardiac catheterization previously. Left  main is large.  It trifurcates. Circumflex: Dominant vessel.  Previously placed stent in the proximal to mid circumflex, OM1 and also PDA widely patent. Ramus intermediate: Large vessel, mild disease. LAD: Large vessel, diffuse  60 to 70% in some views appears to be almost 80% proximal long segment stenosis.  DFR 0.91, FFR 0.89,  not hemodynamically significant.  Lesion slightly progressed compared to prior angiography in 2020. Overall impression: Nonischemic cardiomyopathy, probably related to underlying atrial fibrillation and hypertensive heart disease.  No significant change in coronary anatomy, has moderate underlying disease in the LAD but not hemodynamically significant and previously placed stent in the circumflex widely patent.  Would consider atrial fibrillation ablation to evaluate and see whether his LVEF would improve.  Aggressive medical management of chronic systolic and diastolic heart failure, including weight loss and aggressive blood pressure control would also help.  95 mill contrast utilized.   ASSESSMENT: This is a very pleasant 54 year old Caucasian male referred to the clinic for polycythemia.  Likely reactive in nature secondary to his heart disease and tobacco use, however cannot rule out primary polycythemia.   PLAN: The patient was seen with Dr. Julien Nordmann today.  Dr. Julien Nordmann recommended the patient undergo several lab studies including CBC, CMP, LDH, iron studies, ferritin, erythropoietin, and JAK2 mutation testing.   The patient CBC was unremarkable except for persistent polycythemia with a hemoglobin of 17.6.  His LDH, iron studies, ferritin, and CMP are unremarkable.  His JAK2 mutation testing and erythropoietin are still pending at this time.  Dr. Julien Nordmann believes that his condition is likely reactive in nature to his tobacco use and heart disease; however, we will undergo JAK 2 mutation testing to rule out primary polycythemia vera.   We will see the patient back for  follow-up visit in 2-3 weeks for evaluation, repeat CBC, and to review the results of his pending lab studies.  In the meantime, recommend the patient continue taking 81 milligram baby aspirin.  The patient was strongly encouraged to quit smoking.  The patient knows the importance of smoking cessation he is currently in the process of trying to quit smoking.  The patient voices understanding of current disease status and treatment options and is in agreement with the current care plan.  All questions were answered. The patient knows to call the clinic with any problems, questions or concerns. We can certainly see the patient much sooner if necessary.  Thank you so much for allowing me to participate in the care of Danny Kelley. I will continue to follow up the patient with you and assist in his care.  The total time spent in the appointment was 60 minutes.  Disclaimer: This note was dictated with voice recognition software. Similar sounding words can inadvertently be transcribed and may not be corrected upon review.   Vuk Skillern L Pearlean Sabina February 01, 2020, 2:16 PM   ADDENDUM: Hematology/Oncology Attending: I had a face-to-face encounter with the patient today.  I recommended his care plan.  This is a very pleasant 54 years old white male who was referred to the clinic today for evaluation of persistent polycythemia.  The patient has several medical condition that could be contributing to his polycythemia which is likely reactive in nature.  He has a long history of smoking in addition to congestive heart failure as well as suspicious sleep apnea and COPD. I order several studies today for evaluation of his condition including repeat CBC, iron study and ferritin in addition to JAK2 mutation and erythropoietin level to rule out polycythemia vera. We will arrange for the patient to come back for follow-up visit in around 2 weeks for evaluation and discussion of the pending lab results and  further recommendation regarding  his condition. The patient was advised to decrease red meat and iron supplements in his diet. He was advised to call immediately if he has any other concerning symptoms in the interval.  Disclaimer: This note was dictated with voice recognition software. Similar sounding words can inadvertently be transcribed and may be missed upon review. Eilleen Kempf, MD 02/01/20

## 2020-02-02 ENCOUNTER — Telehealth: Payer: Self-pay | Admitting: Physician Assistant

## 2020-02-02 ENCOUNTER — Ambulatory Visit: Payer: Medicare Other

## 2020-02-02 LAB — ERYTHROPOIETIN: Erythropoietin: 10.7 m[IU]/mL (ref 2.6–18.5)

## 2020-02-02 NOTE — Telephone Encounter (Signed)
Scheduled per los. Called and spoke with patient. Confirmed appt 

## 2020-02-03 ENCOUNTER — Telehealth: Payer: Self-pay

## 2020-02-05 ENCOUNTER — Other Ambulatory Visit: Payer: Self-pay

## 2020-02-06 ENCOUNTER — Telehealth: Payer: Self-pay

## 2020-02-06 ENCOUNTER — Encounter: Payer: Self-pay | Admitting: Internal Medicine

## 2020-02-06 ENCOUNTER — Other Ambulatory Visit: Payer: Self-pay

## 2020-02-06 ENCOUNTER — Telehealth (INDEPENDENT_AMBULATORY_CARE_PROVIDER_SITE_OTHER): Payer: Medicare Other | Admitting: Internal Medicine

## 2020-02-06 ENCOUNTER — Ambulatory Visit: Payer: Medicare Other | Admitting: Cardiology

## 2020-02-06 ENCOUNTER — Encounter: Payer: Self-pay | Admitting: Cardiology

## 2020-02-06 VITALS — BP 132/98 | HR 71 | Temp 97.4°F | Resp 12 | Ht 70.0 in | Wt 253.0 lb

## 2020-02-06 DIAGNOSIS — I10 Essential (primary) hypertension: Secondary | ICD-10-CM

## 2020-02-06 DIAGNOSIS — I4819 Other persistent atrial fibrillation: Secondary | ICD-10-CM

## 2020-02-06 DIAGNOSIS — I428 Other cardiomyopathies: Secondary | ICD-10-CM | POA: Diagnosis not present

## 2020-02-06 DIAGNOSIS — I4891 Unspecified atrial fibrillation: Secondary | ICD-10-CM

## 2020-02-06 DIAGNOSIS — I5042 Chronic combined systolic (congestive) and diastolic (congestive) heart failure: Secondary | ICD-10-CM

## 2020-02-06 DIAGNOSIS — D751 Secondary polycythemia: Secondary | ICD-10-CM | POA: Diagnosis not present

## 2020-02-06 DIAGNOSIS — I25118 Atherosclerotic heart disease of native coronary artery with other forms of angina pectoris: Secondary | ICD-10-CM | POA: Diagnosis not present

## 2020-02-06 MED ORDER — SACUBITRIL-VALSARTAN 24-26 MG PO TABS: 1.0000 | ORAL_TABLET | Freq: Two times a day (BID) | ORAL | Status: AC

## 2020-02-06 MED ORDER — SACUBITRIL-VALSARTAN 49-51 MG PO TABS: 1.0000 | ORAL_TABLET | Freq: Two times a day (BID) | ORAL | Status: AC

## 2020-02-06 MED ORDER — EZETIMIBE 10 MG PO TABS: 10.0000 mg | ORAL_TABLET | Freq: Every day | ORAL | 0 refills | Status: DC

## 2020-02-06 MED ORDER — HYDRALAZINE HCL 50 MG PO TABS: 50.0000 mg | ORAL_TABLET | Freq: Three times a day (TID) | ORAL | 2 refills | Status: DC

## 2020-02-06 NOTE — Progress Notes (Signed)
Primary Physician/Referring:  [name], NP  Patient ID: Danny Kelley, male    DOB: [birth year], 54 y.o.   MRN: [id number]  Chief Complaint  Patient presents with  . Atrial Fibrillation  . Cardiac Catherization  . Follow-up    3 week   HPI:    Danny Kelley  is a 54 y.o. Caucasian male patient with h/o unstable angina pectoris on 11/26/2017, angioplasty to superdominant left circumflex coronary artery with 4.5 mm resolute DES in the midsegment, patent stent placed in Distal Cx and OM 1 in 2014, 03/22/2019, stenting to high-grade stenosis in the distal dominant Circumflex PDA branch. He has hypertension, hyperlipidemia, OSA on CPAP and compliant, tobacco use, quit in March 2017, h/o TIA in Feb 2018 while on anticoagulants, paroxysmal Atrial fib S/P ablation 11/13/2016 by Dr. Rayann Heman, now in permanent atrial fibrillation with no symptoms, seizure disorder and presently on Leveracetam since Sept 2019, prior evaluation for syncope which I felt was psychogenic presents for evaluation of A. Fib and hypertension and chest pain.  He has now been evaluated by Dr. Rayann Heman again 02/06/20 and has been recommended atrial fibrillation ablation.  After my recent cardiac catheterization on 01/24/2020 revealing no change in coronary anatomy, suspect his cardiomyopathy may have been related to underlying A. Fib.  His wife was on the phone, they state that clonidine is not helping with blood pressure and also has affected his emotional status and has anger outbursts.  He also complains of aches in his legs since being back on Crestor.  Symptoms of fatigue and dyspnea has improved and he has started to feel better overall.  Blood pressure is still uncontrolled.  Past Medical History:  Diagnosis Date  . Arthritis    "a little in my right knee" (07/14/2016)  . CAD (coronary artery disease)   . Chronic congestive heart failure with left ventricular diastolic dysfunction ([place])   . Complication of anesthesia     Pt reports "they have a hard time waking me up"  . GERD (gastroesophageal reflux disease)    hx  . Headache    "with every heart issue that I have" (07/14/2016)  . High cholesterol   . History of hiatal hernia 1990s   "fixed it w/scope down my throat"  . Hypertension   . Myocardial infarction [facility]) 05/2013   stents: left PDA, 1st OM at [facility]  . Obesity   . Persistent atrial fibrillation ([place])   . Pneumonia ~ 1992  . QT prolongation 07/15/2016  . Seasonal allergies    "I take Allegra prn" (07/14/2016)  . Tobacco abuse 12/16/2016   Past Surgical History:  Procedure Laterality Date  . APPENDECTOMY  1984  . CARDIOVERSION N/A 05/27/2016   Procedure: CARDIOVERSION;  Surgeon: [name], MD;  Location: [facility] ENDOSCOPY;  Service: Cardiovascular;  Laterality: N/A;  . CARDIOVERSION N/A 06/11/2016   Procedure: CARDIOVERSION;  Surgeon: [name], MD;  Location: [place];  Service: Cardiovascular;  Laterality: N/A;  . CORONARY ANGIOPLASTY WITH STENT PLACEMENT  05/30/2013   "2 stents put in at [facility]" & /stent card  . CORONARY STENT INTERVENTION N/A 11/27/2017   Procedure: CORONARY STENT INTERVENTION;  Surgeon: [name], MD;  Location: [place] CV LAB;  Service: Cardiovascular;  Laterality: N/A;  . CORONARY STENT INTERVENTION N/A 03/21/2019   Procedure: CORONARY STENT INTERVENTION;  Surgeon: [name], MD;  Location: [place] CV LAB;  Service: Cardiovascular;  Laterality: N/A;  . ELECTROPHYSIOLOGIC STUDY N/A 11/13/2016  Procedure: Atrial Fibrillation Ablation;  Surgeon: [name], MD;  Location: [place] CV LAB;  Service: Cardiovascular;  Laterality: N/A;  . ESOPHAGOGASTRODUODENOSCOPY (EGD) WITH ESOPHAGEAL DILATION  1990s X 2  . INTRAVASCULAR PRESSURE WIRE/FFR STUDY N/A 09/21/2018   Procedure: INTRAVASCULAR PRESSURE WIRE/FFR STUDY;  Surgeon: [name], MD;  Location: [place] CV LAB;  Service: Cardiovascular;  Laterality: N/A;  . INTRAVASCULAR PRESSURE WIRE/FFR  STUDY N/A 03/21/2019   Procedure: INTRAVASCULAR PRESSURE WIRE/FFR STUDY;  Surgeon: [name], MD;  Location: [place] CV LAB;  Service: Cardiovascular;  Laterality: N/A;  . INTRAVASCULAR PRESSURE WIRE/FFR STUDY N/A 01/24/2020   Procedure: INTRAVASCULAR PRESSURE WIRE/FFR STUDY;  Surgeon: [name], MD;  Location: [place] CV LAB;  Service: Cardiovascular;  Laterality: N/A;  . KNEE ARTHROSCOPY Right 1986; ~ 1995 X 2  . LAPAROSCOPIC CHOLECYSTECTOMY  2015  . LEFT HEART CATH AND CORONARY ANGIOGRAPHY N/A 11/27/2017   Procedure: LEFT HEART CATH AND CORONARY ANGIOGRAPHY;  Surgeon: [name], MD;  Location: [place] [facility];  Service: Cardiovascular;  Laterality: N/A;  . LEFT HEART CATH AND CORONARY ANGIOGRAPHY N/A 09/21/2018   Procedure: LEFT HEART CATH AND CORONARY ANGIOGRAPHY;  Surgeon: [name], MD;  Location: [place] [facility];  Service: Cardiovascular;  Laterality: N/A;  . LEFT HEART CATH AND CORONARY ANGIOGRAPHY N/A 03/21/2019   Procedure: LEFT HEART CATH AND CORONARY ANGIOGRAPHY;  Surgeon: [name], MD;  Location: [place] [facility];  Service: Cardiovascular;  Laterality: N/A;  . REPAIR OF ESOPHAGUS  1998   perforation of the distal esophagus from bad heartburn  . RIGHT/LEFT HEART CATH AND CORONARY ANGIOGRAPHY N/A 01/24/2020   Procedure: RIGHT/LEFT HEART CATH AND CORONARY ANGIOGRAPHY;  Surgeon: [name], MD;  Location: [place] [facility] LAB;  Service: Cardiovascular;  Laterality: N/A;  . TEE WITHOUT CARDIOVERSION N/A 11/13/2016   Procedure: TRANSESOPHAGEAL ECHOCARDIOGRAM (TEE);  Surgeon: Thayer Headings, MD;  Location: [facility] ENDOSCOPY;  Service: Cardiovascular;  Laterality: N/A;   Family History  Problem Relation Age of Onset  . Heart disease Mother 66       CABG age 15  . Breast cancer Mother   . Tongue cancer Mother   . Diabetes Mother   . Heart attack Father 77       Father had around 7 heart attacks per pt  . Diabetes Father   . Lung cancer Father   . Congestive Heart Failure  Father   . Lung cancer Paternal Uncle     Social History   Tobacco Use  . Smoking status: Former Smoker    Years: 25.00    Types: Cigarettes, E-cigarettes    Quit date: 01/15/2020    Years since quitting: 0.0  . Smokeless tobacco: Never Used  . Tobacco comment: 5 cigs every 3 days  Substance Use Topics  . Alcohol use: No   ROS  Review of Systems  Constitution: Positive for malaise/fatigue and weight gain. Negative for decreased appetite and weight loss.  Cardiovascular: Positive for dyspnea on exertion. Negative for chest pain, leg swelling and orthopnea.  Gastrointestinal: Positive for diarrhea. Negative for abdominal pain.  Neurological: Negative for headaches.  Psychiatric/Behavioral: Positive for depression. Negative for suicidal ideas. The patient is nervous/anxious.   All other systems reviewed and are negative.  Objective  Blood pressure (!) 132/98, pulse 71, temperature (!) 97.4 F (36.3 C), temperature source Temporal, resp. rate 12, height 5\' 10"  (1.778 m), weight 253 lb (114.8 kg), SpO2 97 %.  Vitals with BMI 02/06/2020 02/06/2020 02/01/2020  Height - 5\' 10"  5\' 10"   Weight - 253 lbs 256 lbs  BMI - [id number]  Systolic [id number] [id number]  Diastolic 98 [id number] 98  Pulse 71 64 99     Physical Exam  Constitutional: Vital signs are normal.  Moderately obese  Cardiovascular: Normal rate, regular rhythm, normal heart sounds and intact distal pulses.  No leg edema, no JVD.  Pulmonary/Chest: Effort normal and breath sounds normal. No accessory muscle usage. No respiratory distress.  Abdominal: Soft. Bowel sounds are normal.  Musculoskeletal:        General: Normal range of motion.  Vitals reviewed.  Laboratory examination:   Recent Labs    03/18/19 1040 03/21/19 0615 01/02/20 1445 01/24/20 1500 01/24/20 1501 01/24/20 1523 02/01/20 1301  NA   < > 136 139   < > 140 139 137  K   < > 3.6 4.4   < > 3.7 3.6 3.8  CL  --  103 98  --   --   --  105  CO2  --  24 26  --   --   --   23  GLUCOSE  --  110* 151*  --   --   --  138*  BUN  --  13 12  --   --   --  12  CREATININE  --  0.92 0.88  --   --   --  0.79  CALCIUM  --  8.3* 9.4  --   --   --  8.8*  GFRNONAA  --  >60 99  --   --   --  >60  GFRAA  --  >60 115  --   --   --  >60   < > = values in this interval not displayed.   estimated creatinine clearance is 138.6 mL/min (by C-G formula based on SCr of 0.79 mg/dL).  CMP Latest Ref Rng & Units 02/01/2020 01/24/2020 01/24/2020  Glucose 70 - 99 mg/dL 138(H) - -  BUN 6 - 20 mg/dL 12 - -  Creatinine 0.61 - 1.24 mg/dL 0.79 - -  Sodium 135 - 145 mmol/L 137 139 140  Potassium 3.5 - 5.1 mmol/L 3.8 3.6 3.7  Chloride 98 - 111 mmol/L 105 - -  CO2 22 - 32 mmol/L 23 - -  Calcium 8.9 - 10.3 mg/dL 8.8(L) - -  Total Protein 6.5 - 8.1 g/dL 7.4 - -  Total Bilirubin 0.3 - 1.2 mg/dL 0.4 - -  Alkaline Phos 38 - 126 U/L 140(H) - -  AST 15 - 41 U/L 18 - -  ALT 0 - 44 U/L 27 - -   CBC Latest Ref Rng & Units 02/01/2020 01/24/2020 01/24/2020  WBC 4.0 - 10.5 K/uL 9.6 - -  Hemoglobin 13.0 - 17.0 g/dL 17.6(H) 15.6 16.3  Hematocrit 39.0 - 52.0 % 51.5 46.0 48.0  Platelets 150 - 400 K/uL 259 - -   Lipid Panel     Component Value Date/Time   CHOL 239 (H) 01/02/2020 1445   TRIG 143 01/02/2020 1445   HDL 62 01/02/2020 1445   CHOLHDL 5.0 11/27/2017 0634   VLDL 16 11/27/2017 0634   LDLCALC 152 (H) 01/02/2020 1445   HEMOGLOBIN A1C Lab Results  Component Value Date   HGBA1C 6.0 (H) 11/27/2017   MPG 125.5 11/27/2017   TSH Recent Labs    01/02/20 1445  TSH 2.080   Medications and allergies   Allergies  Allergen Reactions  . Crestor [Rosuvastatin]  Other (See Comments)    Severe leg cramps  . Dilaudid [Hydromorphone] Itching and Swelling  . Isosorbide     Muscle pain and cramps  . Lexapro [Escitalopram]     Have bad thoughts made anxiety worse   . Lipitor [Atorvastatin Calcium] Other (See Comments)    myalgias  . Erythromycin Other (See Comments)    Hallucinations   .  Oxycodone Itching and Rash    Swelling to face, hands, mouth Completley intolerant to any meds with oxycodone in it      Current Outpatient Medications  Medication Instructions  . acetaminophen (TYLENOL) 500 mg, Oral, 2 times daily PRN  . amLODipine (NORVASC) 10 mg, Oral, Daily  . apixaban (ELIQUIS) 5 mg, Oral, 2 times daily  . aspirin EC 81 mg, Oral, Daily  . bisacodyl (DULCOLAX) 5 mg, Oral, Daily PRN  . docusate sodium (PX DOCUSATE SODIUM) 100 mg, Oral, 2 times daily PRN  . ezetimibe (ZETIA) 10 mg, Oral, Daily  . fexofenadine (ALLEGRA) 180 mg, Oral, Daily PRN  . hydrALAZINE (APRESOLINE) 50 mg, Oral, 3 times daily  . metoprolol tartrate (LOPRESSOR) 100 mg, Oral, 2 times daily  . nitroGLYCERIN (NITROSTAT) 0.4 mg, Sublingual, Every 5 min x3 PRN  . rosuvastatin (CRESTOR) 20 mg, Oral, Daily  . shark liver oil-cocoa butter (PREPARATION H) 0.25-3-85.5 % suppository 1 suppository, Rectal, 2 times daily PRN  . torsemide (DEMADEX) 20 mg, Oral, As needed    Radiology:   No results found.  Cardiac Studies:   Sleep study X 2 negative   Event Monitor 30 days 07/28/2018: Paroxysmal episodes of atrial fibrillation with controlled ventricular response was noted along with PVCs. Some of this symptoms of chest pain, palpitations and dizziness correlated with atrial fibrillation, others showed normal sinus rhythm. No heart block, no other significant arrhythmias.  Direct current cardioversion ( Ist 05/27/16) 06/11/2016: 150x2, 200 x1 to NSR on Sotalol. A. Fib ablation by Dr. [name] on 11/13/2016  Echocardiogram 12/28/2019:  Severely depressed LV systolic function with visual EF 25-30%. Severe  global hypokinesis Left ventricle cavity is normal in size. Moderate left  ventricular hypertrophy. Doppler evidence of grade II diastolic  dysfunction, elevated LAP. Calculated EF 29%.  No significant valvular abnormalities.  Left atrial cavity is mildly dilated.  Right ventricle cavity is normal  in size but visually low normal right  ventricular function.  The aortic root is dilated, 4.0cm at the level of sinus of Valsalva.  Proximal ascending aorta not well visualized.  Prior study dated 11/27/2017: Normal LVEF. Mild LVH. Normal diastolic  parameters. Mild LA enlargement.  Left and right heart catheterization 01/24/2020: Right heart: RA 18/18, mean 17; RV 44/10, EDP 15; PA 48/19, mean 33.  PA saturation 72%.  PW 25/26, mean 23 mmHg.  Aortic saturation 98%. Cardiac output 4.73, cardiac index 2.09, reduced.  QP/QS 1.00. Impression: Mild decrease in cardiac output and cardiac index.  Mild to moderate pulmonary hypertension related to elevated LVEDP.  Left heart catheterization: LV pressure 139/17, EDP 31 mmHg.  Aortic pressure 144/97, mean 122 mmHg.  No pressure gradient. Right coronary artery is nondominant but a large vessel with a proximal 70% stenosis unchanged from cardiac catheterization on 03/21/2019. Left main is large.  It trifurcates. Circumflex: Dominant vessel.  Previously placed stent in the proximal to mid circumflex, OM1 and also PDA widely patent. Ramus intermediate: Large vessel, mild disease. LAD: Large vessel, diffuse 60 to 70% in some views appears to be almost 80% proximal long segment stenosis.  DFR 0.91, FFR 0.89,  not hemodynamically significant.  Lesion slightly progressed compared to prior angiography in 2020.  Overall impression: Nonischemic cardiomyopathy, probably related to underlying atrial fibrillation and hypertensive heart disease.  No significant change in coronary anatomy, has moderate underlying disease in the LAD but not hemodynamically significant and previously placed stent in the circumflex widely patent (Circumflex stent placed 11/27/2017, 4.5 x 18 mm resolute widely patent, patent 3.5 x 18 mm Xience DES to dominant distal left circumflex, 2.5 x 12 mm Xience to OM1 placed 05/30/2013.)    Recommendation: Would consider atrial fibrillation ablation to  evaluate and see whether his LVEF would improve.  Aggressive medical management of chronic systolic and diastolic heart failure, including weight loss and aggressive blood pressure control would also help.  95 mill contrast utilized.  EKG 01/04/2020: Atrial fibrillation with rapid ventricular response at the rate of 111 bpm, normal axis, diffuse ST-T wave abnormality, cannot exclude inferior ischemia, anterolateral ischemia.  Normal QT interval.   No significant change from  EKG 12/20/2019: Atrial fibrillation with RVR at rate of 121 bpm  EKG 03/31/2019: Atrial fibrillation controlled ventricular response at the rate of 70 bpm, normal axis.   Assessment     ICD-10-CM   1. Chronic combined systolic and diastolic heart failure (HCC)  I50.42 hydrALAZINE (APRESOLINE) 50 MG tablet    sacubitril-valsartan (ENTRESTO) 49-51 mg per tablet    sacubitril-valsartan (ENTRESTO) 24-26 mg per tablet    Basic metabolic panel  2. Other persistent atrial fibrillation ([place]). CHA2DS2-VASc Score is 5. Yearly risk of stroke: 6.7% (HTN, Vasc Dz, CHF, TIA).  I48.19   3. Essential hypertension  I10 hydrALAZINE (APRESOLINE) 50 MG tablet    Basic metabolic panel  4. Polycythemia  D75.1     Meds ordered this encounter  Medications  . hydrALAZINE (APRESOLINE) 50 MG tablet    Sig: Take 1 tablet (50 mg total) by mouth 3 (three) times daily.    Dispense:  90 tablet    Refill:  2  . sacubitril-valsartan (ENTRESTO) 49-51 mg per tablet    Order Specific Question:   ACE-inhibitors have NOT been administered in the past 36-hours.    Answer:   YES (confirmed by ordering provider)  . sacubitril-valsartan (ENTRESTO) 24-26 mg per tablet    Order Specific Question:   ACE-inhibitors have NOT been administered in the past 36-hours.    Answer:   YES (confirmed by ordering provider)    Medications Discontinued During This Encounter  Medication Reason  . cloNIDine (CATAPRES) 0.2 MG tablet Discontinued by provider      Recommendations:   PHARIS KRUPPA  is a  54 y.o. male  with CAD, unstable angina pectoris on 11/26/2017, angioplasty to superdominant left circumflex coronary artery with 4.5 mm resolute DES in the midsegment, patent stent placed in Distal Cx and OM 1 in 2014, 03/22/2019, stenting to high-grade stenosis in the distal dominant Circumflex PDA branch. He has hypertension, hyperlipidemia, OSA on CPAP and compliant, tobacco use, quit in March 2017, h/o TIA in Feb 2018 while on anticoagulants, paroxysmal Atrial fib S/P ablation 11/13/2016 by Dr. Rayann Heman, ([place] & Sotalol ineffective), now in persistent atrial fibrillation, ?seizure (pseudoseizure) disorder and presently on Leveracetam since Sept 2019, prior evaluation for syncope which I felt was psychogenic presents for evaluation of A. Fib and hypertension and chest pain.    He has now been evaluated by Dr. Rayann Heman again 02/06/20 and has been recommended atrial fibrillation ablation.  After my recent cardiac catheterization on  01/24/2020 revealing no change in coronary anatomy, suspect his cardiomyopathy may have been related to underlying A. Fib.  Blood pressure is now much improved but still uncontrolled, I will start him on hydralazine 50 mg p.o. 3 times daily, due to side effects of clonidine with emotional outburst and frequency, will discontinue this gradually, patient will take 1/2 tablet twice daily for a week.  In view of chronic systolic and diastolic heart failure, I will also start him on Entresto 24/26 1 p.o. twice daily for a week followed by 49/51 mg twice daily, samples of the same were given to the patient, he will obtain BMP in 10 days, I would like to see him back in 2 to 3 weeks, just prior to his atrial fibrillation ablation. He is not in acute decompensated heart failure.  Weight loss again discussed with the patient.  He is now back on Crestor, now developing mild myalgias, advised him to continue Crestor for now and will address this once  his cardiac status is stable.  During recent lab work, he was also noted to have elevated hemoglobin, may be related to prior tobacco use and COPD but needs to be followed through.  He is presently on anticoagulation, continue the same for atrial fibrillation.  This is a 40-minute encounter with patient and his wife over the telephone, review of his consultation with Dr. Rayann Heman and making therapeutic decisions regarding his cardiac conditions.   [name], MD, [facility] 02/06/2020, 3:41 PM [facility] [place] Office: [phone]  CC: [name], MD (EP)

## 2020-02-06 NOTE — H&P (View-Only) (Signed)
Electrophysiology TeleHealth Note   Due to national recommendations of social distancing due to COVID 19, an audio/video telehealth visit is felt to be most appropriate for this patient at this time.  See MyChart message from today for the patient's consent to telehealth for [facility].  Date:  02/06/2020   ID:  Danny Kelley, DOB [birth year], MRN [id number]  Location: patient's home  Provider location:  [facility]  Evaluation Performed: Follow-up visit  PCP:  [name], NP   Electrophysiologist:  Dr Rayann Heman  Chief Complaint:  palpitations  History of Present Illness:    Danny Kelley is a 53 y.o. male who presents via telehealth conferencing today.  Since last being seen in our clinic, the patient reports doing reasonably well.  I saw him last 3018.  He has been followed by Dr Einar Gip since that time. He reports feeling poorly over the past 6-12 months.  He attributes this to afib.  Unfortunately, his afib has worsened.  He has developed cardiomyopathy, possible related to his afib.  He has had 4 caths in the past 2 years.  He reports tachypalpitations frequently.  She has SOB continuously over the past 6-7 months. Today, he denies symptoms of  chest pain,  lower extremity edema, dizziness, presyncope, or syncope.  The patient is otherwise without complaint today.   Past Medical History:  Diagnosis Date  . Arthritis    "a little in my right knee" (07/14/2016)  . CAD (coronary artery disease)   . Chronic congestive heart failure with left ventricular diastolic dysfunction ([place])   . Complication of anesthesia    Pt reports "they have a hard time waking me up"  . GERD (gastroesophageal reflux disease)    hx  . Headache    "with every heart issue that I have" (07/14/2016)  . High cholesterol   . History of hiatal hernia 1990s   "fixed it w/scope down my throat"  . Hypertension   . Myocardial infarction [facility]) 05/2013   stents: left PDA, 1st OM at [facility]  .  Obesity   . Persistent atrial fibrillation ([place])   . Pneumonia ~ 1992  . QT prolongation 07/15/2016  . Seasonal allergies    "I take Allegra prn" (07/14/2016)  . Tobacco abuse 12/16/2016    Past Surgical History:  Procedure Laterality Date  . APPENDECTOMY  1984  . CARDIOVERSION N/A 05/27/2016   Procedure: CARDIOVERSION;  Surgeon: [name], MD;  Location: [facility] ENDOSCOPY;  Service: Cardiovascular;  Laterality: N/A;  . CARDIOVERSION N/A 06/11/2016   Procedure: CARDIOVERSION;  Surgeon: [name], MD;  Location: [place];  Service: Cardiovascular;  Laterality: N/A;  . CORONARY ANGIOPLASTY WITH STENT PLACEMENT  05/30/2013   "2 stents put in at [facility]" & /stent card  . CORONARY STENT INTERVENTION N/A 11/27/2017   Procedure: CORONARY STENT INTERVENTION;  Surgeon: [name], MD;  Location: [place] CV LAB;  Service: Cardiovascular;  Laterality: N/A;  . CORONARY STENT INTERVENTION N/A 03/21/2019   Procedure: CORONARY STENT INTERVENTION;  Surgeon: [name], MD;  Location: [place] CV LAB;  Service: Cardiovascular;  Laterality: N/A;  . ELECTROPHYSIOLOGIC STUDY N/A 11/13/2016   Procedure: Atrial Fibrillation Ablation;  Surgeon: [name], MD;  Location: [place] CV LAB;  Service: Cardiovascular;  Laterality: N/A;  . ESOPHAGOGASTRODUODENOSCOPY (EGD) WITH ESOPHAGEAL DILATION  1990s X 2  . INTRAVASCULAR PRESSURE WIRE/FFR STUDY N/A 09/21/2018   Procedure: INTRAVASCULAR PRESSURE WIRE/FFR STUDY;  Surgeon: [name], MD;  Location: [place] CV LAB;  Service: Cardiovascular;  Laterality: N/A;  . INTRAVASCULAR PRESSURE WIRE/FFR STUDY N/A 03/21/2019   Procedure: INTRAVASCULAR PRESSURE WIRE/FFR STUDY;  Surgeon: [name], MD;  Location: [place] CV LAB;  Service: Cardiovascular;  Laterality: N/A;  . INTRAVASCULAR PRESSURE WIRE/FFR STUDY N/A 01/24/2020   Procedure: INTRAVASCULAR PRESSURE WIRE/FFR STUDY;  Surgeon: [name], MD;  Location: [place] CV LAB;  Service: Cardiovascular;   Laterality: N/A;  . KNEE ARTHROSCOPY Right 1986; ~ 1995 X 2  . LAPAROSCOPIC CHOLECYSTECTOMY  2015  . LEFT HEART CATH AND CORONARY ANGIOGRAPHY N/A 11/27/2017   Procedure: LEFT HEART CATH AND CORONARY ANGIOGRAPHY;  Surgeon: [name], MD;  Location: [place] CV LAB;  Service: Cardiovascular;  Laterality: N/A;  . LEFT HEART CATH AND CORONARY ANGIOGRAPHY N/A 09/21/2018   Procedure: LEFT HEART CATH AND CORONARY ANGIOGRAPHY;  Surgeon: [name], MD;  Location: [place] CV LAB;  Service: Cardiovascular;  Laterality: N/A;  . LEFT HEART CATH AND CORONARY ANGIOGRAPHY N/A 03/21/2019   Procedure: LEFT HEART CATH AND CORONARY ANGIOGRAPHY;  Surgeon: [name], MD;  Location: [place] CV LAB;  Service: Cardiovascular;  Laterality: N/A;  . REPAIR OF ESOPHAGUS  1998   perforation of the distal esophagus from bad heartburn  . RIGHT/LEFT HEART CATH AND CORONARY ANGIOGRAPHY N/A 01/24/2020   Procedure: RIGHT/LEFT HEART CATH AND CORONARY ANGIOGRAPHY;  Surgeon: [name], MD;  Location: [place] CV LAB;  Service: Cardiovascular;  Laterality: N/A;  . TEE WITHOUT CARDIOVERSION N/A 11/13/2016   Procedure: TRANSESOPHAGEAL ECHOCARDIOGRAM (TEE);  Surgeon: Thayer Headings, MD;  Location: [facility] ENDOSCOPY;  Service: Cardiovascular;  Laterality: N/A;    Current Outpatient Medications  Medication Sig Dispense Refill  . acetaminophen (TYLENOL) 500 MG tablet Take 500 mg by mouth 2 (two) times daily as needed for moderate pain.    Marland Kitchen amLODipine (NORVASC) 10 MG tablet Take 1 tablet (10 mg total) by mouth daily. 90 tablet 1  . apixaban (ELIQUIS) 5 MG TABS tablet Take 1 tablet (5 mg total) by mouth 2 (two) times daily. 180 tablet 3  . aspirin EC 81 MG tablet Take 1 tablet (81 mg total) by mouth daily. 90 tablet 3  . bisacodyl (DULCOLAX) 5 MG EC tablet Take 5 mg by mouth daily as needed.     . cloNIDine (CATAPRES) 0.2 MG tablet Take 1 tablet (0.2 mg total) by mouth 3 (three) times daily. 90 tablet 1  . docusate sodium (PX  DOCUSATE SODIUM) 100 MG capsule Take 100 mg by mouth 2 (two) times daily as needed.     . ezetimibe (ZETIA) 10 MG tablet Take 1 tablet (10 mg total) by mouth daily. 90 tablet 0  . fexofenadine (ALLEGRA) 180 MG tablet Take 180 mg by mouth daily as needed (seasonal allergies).     . metoprolol tartrate (LOPRESSOR) 100 MG tablet Take 1 tablet (100 mg total) by mouth 2 (two) times daily. 60 tablet 2  . nitroGLYCERIN (NITROSTAT) 0.4 MG SL tablet Place 1 tablet (0.4 mg total) under the tongue every 5 (five) minutes x 3 doses as needed for chest pain. 25 tablet 2  . rosuvastatin (CRESTOR) 20 MG tablet Take 1 tablet (20 mg total) by mouth daily. 30 tablet 2  . shark liver oil-cocoa butter (PREPARATION H) 0.25-3-85.5 % suppository Place 1 suppository rectally 2 (two) times daily as needed for hemorrhoids.    . torsemide (DEMADEX) 20 MG tablet Take 20 mg by mouth as needed.  No current facility-administered medications for this visit.    Allergies:   Crestor [rosuvastatin], Dilaudid [hydromorphone], Isosorbide, Lexapro [escitalopram], Lipitor [atorvastatin calcium], Erythromycin, and Oxycodone   Social History:  The patient  reports that he has been smoking cigarettes and e-cigarettes. He has smoked for the past 25.00 years. He has never used smokeless tobacco. He reports that he does not drink alcohol or use drugs.   ROS:  Please see the history of present illness.   All other systems are personally reviewed and negative.    Exam:    Vital Signs:  There were no vitals taken for this visit.  Well sounding and appearing,overweight, alert and conversant, regular work of breathing,  good skin color Eyes- anicteric, neuro- grossly intact, skin- no apparent rash or lesions or cyanosis, mouth- oral mucosa is pink  Labs/Other Tests and Data Reviewed:    Recent Labs: 01/02/2020: TSH 2.080 02/01/2020: ALT 27; BUN 12; Creatinine 0.79; Hemoglobin 17.6; Platelet Count 259; Potassium 3.8; Sodium 137   Wt  Readings from Last 3 Encounters:  02/01/20 256 lb (116.1 kg)  01/24/20 242 lb (109.8 kg)  01/04/20 255 lb (115.7 kg)       ASSESSMENT & PLAN:    1.  Persistent atrial fibrillation The patient has symptomatic, recurrent persistent atrial fibrillation. he has failed medical therapy with multaq.  He underwent ablation by me in 2018 and did well for several years.  He unfortunately, appears to have been persistently in afib over the past year.  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 Chads2vasc score is at least 3.  he is anticoagulated with eliquis . Therapeutic strategies for afib including medicine and ablation were discussed in detail with the patient today. Risk, benefits, and alternatives to EP study and radiofrequency ablation for afib were also discussed in detail today. These risks include but are not limited to stroke, bleeding, vascular damage, tamponade, perforation, damage to the esophagus, lungs, and other structures, pulmonary vein stenosis, worsening renal function, and death. The patient understands these risk and wishes to proceed.  We will therefore proceed with catheter ablation at the next available time.  Carto, ICE, anesthesia are requested for the procedure.  Will also obtain cardiac CT prior to the procedure to exclude LAA thrombus and further evaluate atrial anatomy.  2. Nonischemic CM Likely due to Afib.  Proceed with ablation as above  3. CAD No ischemic symptoms Stop ASA to avoid increased bleeding risks with eliquis  4. Morbid obesity Weight loss is advised  5. Hypertensive cardiovascular disease Stable No change required today     Patient Risk:  after full review of this patients clinical status, I feel that they are at high risk at this time.  Today, I have spent 25 minutes with the patient with telehealth technology discussing arrhythmia management .    Army Fossa, MD  02/06/2020 10:20 AM     [facility] [address] [place] [address] [phone] (office) [phone] (fax)

## 2020-02-06 NOTE — Telephone Encounter (Signed)
Pt scheduled for afib ablation on March 01, 2020 at 730 am  Covid test scheduled  Instruction letters sent via mychart.  Work up complete.

## 2020-02-06 NOTE — Progress Notes (Signed)
Electrophysiology TeleHealth Note   Due to national recommendations of social distancing due to COVID 19, an audio/video telehealth visit is felt to be most appropriate for this patient at this time.  See MyChart message from today for the patient's consent to telehealth for [facility].  Date:  02/06/2020   ID:  Danny Kelley, DOB Nov 22, 1965, MRN [id number]  Location: patient's home  Provider location:  [facility]  Evaluation Performed: Follow-up visit  PCP:  [name], NP   Electrophysiologist:  Dr Rayann Heman  Chief Complaint:  palpitations  History of Present Illness:    Danny Kelley is a 54 y.o. male who presents via telehealth conferencing today.  Since last being seen in our clinic, the patient reports doing reasonably well.  I saw him last 3018.  He has been followed by Dr Einar Gip since that time. He reports feeling poorly over the past 6-12 months.  He attributes this to afib.  Unfortunately, his afib has worsened.  He has developed cardiomyopathy, possible related to his afib.  He has had 4 caths in the past 2 years.  He reports tachypalpitations frequently.  She has SOB continuously over the past 6-7 months. Today, he denies symptoms of  chest pain,  lower extremity edema, dizziness, presyncope, or syncope.  The patient is otherwise without complaint today.   Past Medical History:  Diagnosis Date  . Arthritis    "a little in my right knee" (07/14/2016)  . CAD (coronary artery disease)   . Chronic congestive heart failure with left ventricular diastolic dysfunction ([place])   . Complication of anesthesia    Pt reports "they have a hard time waking me up"  . GERD (gastroesophageal reflux disease)    hx  . Headache    "with every heart issue that I have" (07/14/2016)  . High cholesterol   . History of hiatal hernia 1990s   "fixed it w/scope down my throat"  . Hypertension   . Myocardial infarction [facility]) 05/2013   stents: left PDA, 1st OM at [facility]  .  Obesity   . Persistent atrial fibrillation ([place])   . Pneumonia ~ 1992  . QT prolongation 07/15/2016  . Seasonal allergies    "I take Allegra prn" (07/14/2016)  . Tobacco abuse 12/16/2016    Past Surgical History:  Procedure Laterality Date  . APPENDECTOMY  1984  . CARDIOVERSION N/A 05/27/2016   Procedure: CARDIOVERSION;  Surgeon: [name], MD;  Location: [facility] ENDOSCOPY;  Service: Cardiovascular;  Laterality: N/A;  . CARDIOVERSION N/A 06/11/2016   Procedure: CARDIOVERSION;  Surgeon: [name], MD;  Location: [place];  Service: Cardiovascular;  Laterality: N/A;  . CORONARY ANGIOPLASTY WITH STENT PLACEMENT  05/30/2013   "2 stents put in at [facility]" & /stent card  . CORONARY STENT INTERVENTION N/A 11/27/2017   Procedure: CORONARY STENT INTERVENTION;  Surgeon: [name], MD;  Location: [place] CV LAB;  Service: Cardiovascular;  Laterality: N/A;  . CORONARY STENT INTERVENTION N/A 03/21/2019   Procedure: CORONARY STENT INTERVENTION;  Surgeon: [name], MD;  Location: [place] CV LAB;  Service: Cardiovascular;  Laterality: N/A;  . ELECTROPHYSIOLOGIC STUDY N/A 11/13/2016   Procedure: Atrial Fibrillation Ablation;  Surgeon: [name], MD;  Location: [place] CV LAB;  Service: Cardiovascular;  Laterality: N/A;  . ESOPHAGOGASTRODUODENOSCOPY (EGD) WITH ESOPHAGEAL DILATION  1990s X 2  . INTRAVASCULAR PRESSURE WIRE/FFR STUDY N/A 09/21/2018   Procedure: INTRAVASCULAR PRESSURE WIRE/FFR STUDY;  Surgeon: [name], MD;  Location: [place] CV LAB;  Service: Cardiovascular;  Laterality: N/A;  . INTRAVASCULAR PRESSURE WIRE/FFR STUDY N/A 03/21/2019   Procedure: INTRAVASCULAR PRESSURE WIRE/FFR STUDY;  Surgeon: [name], MD;  Location: [place] CV LAB;  Service: Cardiovascular;  Laterality: N/A;  . INTRAVASCULAR PRESSURE WIRE/FFR STUDY N/A 01/24/2020   Procedure: INTRAVASCULAR PRESSURE WIRE/FFR STUDY;  Surgeon: [name], MD;  Location: [place] CV LAB;  Service: Cardiovascular;   Laterality: N/A;  . KNEE ARTHROSCOPY Right 1986; ~ 1995 X 2  . LAPAROSCOPIC CHOLECYSTECTOMY  2015  . LEFT HEART CATH AND CORONARY ANGIOGRAPHY N/A 11/27/2017   Procedure: LEFT HEART CATH AND CORONARY ANGIOGRAPHY;  Surgeon: [name], MD;  Location: [place] CV LAB;  Service: Cardiovascular;  Laterality: N/A;  . LEFT HEART CATH AND CORONARY ANGIOGRAPHY N/A 09/21/2018   Procedure: LEFT HEART CATH AND CORONARY ANGIOGRAPHY;  Surgeon: [name], MD;  Location: [place] CV LAB;  Service: Cardiovascular;  Laterality: N/A;  . LEFT HEART CATH AND CORONARY ANGIOGRAPHY N/A 03/21/2019   Procedure: LEFT HEART CATH AND CORONARY ANGIOGRAPHY;  Surgeon: [name], MD;  Location: [place] CV LAB;  Service: Cardiovascular;  Laterality: N/A;  . REPAIR OF ESOPHAGUS  1998   perforation of the distal esophagus from bad heartburn  . RIGHT/LEFT HEART CATH AND CORONARY ANGIOGRAPHY N/A 01/24/2020   Procedure: RIGHT/LEFT HEART CATH AND CORONARY ANGIOGRAPHY;  Surgeon: [name], MD;  Location: [place] CV LAB;  Service: Cardiovascular;  Laterality: N/A;  . TEE WITHOUT CARDIOVERSION N/A 11/13/2016   Procedure: TRANSESOPHAGEAL ECHOCARDIOGRAM (TEE);  Surgeon: Thayer Headings, MD;  Location: [facility] ENDOSCOPY;  Service: Cardiovascular;  Laterality: N/A;    Current Outpatient Medications  Medication Sig Dispense Refill  . acetaminophen (TYLENOL) 500 MG tablet Take 500 mg by mouth 2 (two) times daily as needed for moderate pain.    Marland Kitchen amLODipine (NORVASC) 10 MG tablet Take 1 tablet (10 mg total) by mouth daily. 90 tablet 1  . apixaban (ELIQUIS) 5 MG TABS tablet Take 1 tablet (5 mg total) by mouth 2 (two) times daily. 180 tablet 3  . aspirin EC 81 MG tablet Take 1 tablet (81 mg total) by mouth daily. 90 tablet 3  . bisacodyl (DULCOLAX) 5 MG EC tablet Take 5 mg by mouth daily as needed.     . cloNIDine (CATAPRES) 0.2 MG tablet Take 1 tablet (0.2 mg total) by mouth 3 (three) times daily. 90 tablet 1  . docusate sodium (PX  DOCUSATE SODIUM) 100 MG capsule Take 100 mg by mouth 2 (two) times daily as needed.     . ezetimibe (ZETIA) 10 MG tablet Take 1 tablet (10 mg total) by mouth daily. 90 tablet 0  . fexofenadine (ALLEGRA) 180 MG tablet Take 180 mg by mouth daily as needed (seasonal allergies).     . metoprolol tartrate (LOPRESSOR) 100 MG tablet Take 1 tablet (100 mg total) by mouth 2 (two) times daily. 60 tablet 2  . nitroGLYCERIN (NITROSTAT) 0.4 MG SL tablet Place 1 tablet (0.4 mg total) under the tongue every 5 (five) minutes x 3 doses as needed for chest pain. 25 tablet 2  . rosuvastatin (CRESTOR) 20 MG tablet Take 1 tablet (20 mg total) by mouth daily. 30 tablet 2  . shark liver oil-cocoa butter (PREPARATION H) 0.25-3-85.5 % suppository Place 1 suppository rectally 2 (two) times daily as needed for hemorrhoids.    . torsemide (DEMADEX) 20 MG tablet Take 20 mg by mouth as needed.  No current facility-administered medications for this visit.    Allergies:   Crestor [rosuvastatin], Dilaudid [hydromorphone], Isosorbide, Lexapro [escitalopram], Lipitor [atorvastatin calcium], Erythromycin, and Oxycodone   Social History:  The patient  reports that he has been smoking cigarettes and e-cigarettes. He has smoked for the past 25.00 years. He has never used smokeless tobacco. He reports that he does not drink alcohol or use drugs.   ROS:  Please see the history of present illness.   All other systems are personally reviewed and negative.    Exam:    Vital Signs:  There were no vitals taken for this visit.  Well sounding and appearing,overweight, alert and conversant, regular work of breathing,  good skin color Eyes- anicteric, neuro- grossly intact, skin- no apparent rash or lesions or cyanosis, mouth- oral mucosa is pink  Labs/Other Tests and Data Reviewed:    Recent Labs: 01/02/2020: TSH 2.080 02/01/2020: ALT 27; BUN 12; Creatinine 0.79; Hemoglobin 17.6; Platelet Count 259; Potassium 3.8; Sodium 137   Wt  Readings from Last 3 Encounters:  02/01/20 256 lb (116.1 kg)  01/24/20 242 lb (109.8 kg)  01/04/20 255 lb (115.7 kg)       ASSESSMENT & PLAN:    1.  Persistent atrial fibrillation The patient has symptomatic, recurrent persistent atrial fibrillation. he has failed medical therapy with multaq.  He underwent ablation by me in 2018 and did well for several years.  He unfortunately, appears to have been persistently in afib over the past year.  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 Chads2vasc score is at least 3.  he is anticoagulated with eliquis . Therapeutic strategies for afib including medicine and ablation were discussed in detail with the patient today. Risk, benefits, and alternatives to EP study and radiofrequency ablation for afib were also discussed in detail today. These risks include but are not limited to stroke, bleeding, vascular damage, tamponade, perforation, damage to the esophagus, lungs, and other structures, pulmonary vein stenosis, worsening renal function, and death. The patient understands these risk and wishes to proceed.  We will therefore proceed with catheter ablation at the next available time.  Carto, ICE, anesthesia are requested for the procedure.  Will also obtain cardiac CT prior to the procedure to exclude LAA thrombus and further evaluate atrial anatomy.  2. Nonischemic CM Likely due to Afib.  Proceed with ablation as above  3. CAD No ischemic symptoms Stop ASA to avoid increased bleeding risks with eliquis  4. Morbid obesity Weight loss is advised  5. Hypertensive cardiovascular disease Stable No change required today     Patient Risk:  after full review of this patients clinical status, I feel that they are at high risk at this time.  Today, I have spent 25 minutes with the patient with telehealth technology discussing arrhythmia management .    Army Fossa, MD  02/06/2020 10:20 AM     [facility] [address] [place] [address] [phone] (office) [phone] (fax)

## 2020-02-06 NOTE — Telephone Encounter (Signed)
Outreach made by phone.  Went to VM.  Sent Mychart message.

## 2020-02-06 NOTE — Telephone Encounter (Signed)
-----   Message from [name], MD sent at 02/06/2020 10:19 AM EDT ----- Afib ablation Carto, ICE, anesthesia  Cardiac CT prior

## 2020-02-10 LAB — JAK2 (INCLUDING V617F AND EXON 12), MPL,& CALR W/RFL MPN PANEL (NGS)

## 2020-02-15 ENCOUNTER — Ambulatory Visit (HOSPITAL_COMMUNITY)
Admission: RE | Admit: 2020-02-15 | Discharge: 2020-02-15 | Disposition: A | Discharge status: Home / Self Care | Payer: Medicare Other | Source: Ambulatory Visit | Attending: Internal Medicine | Admitting: Internal Medicine

## 2020-02-15 ENCOUNTER — Other Ambulatory Visit: Payer: Self-pay

## 2020-02-15 ENCOUNTER — Encounter (HOSPITAL_COMMUNITY): Payer: Self-pay

## 2020-02-15 DIAGNOSIS — I4891 Unspecified atrial fibrillation: Secondary | ICD-10-CM | POA: Diagnosis not present

## 2020-02-15 MED ORDER — IOHEXOL 350 MG/ML SOLN
80.0000 mL | Freq: Once | INTRAVENOUS | Status: AC | PRN
  Administered 2020-02-15: 80 mL via INTRAVENOUS

## 2020-02-15 NOTE — Progress Notes (Deleted)
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 male returns to the clinic for a follow up visit. The patient was referred to the clinic for evaluation of polycythemia. He had several lab studies performed to further assess the etiology of his polycythemia. He had JAK 2 mutation testing, EPO, and iron studies performed which were all unremarkable. His polycythemia is likely secondary to his tobacco use and his cardiopulmonary disease. He is still smoking approximately __ cigarettes per day. He is followed by Dr. Einar Gip from cardiology for his heart disease. They are planning to perform an ablation for his atrial fibrillation later this month.   Today, the patient continues to report fatigue and dyspnea which is likely related to his tobacco abuse and heart disease. The patient denies any recent fever, chills, night sweats, or significant weight loss.  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  He has had extensive testing performed for this concern in the past.  He denies any significant headaches or visual changes.  He denies any nausea, constipation, abdominal pain/or abdominal fullness.  He denies volume depletion with recent vomiting or diarrhea. Patient denies any bleeding or bruising including epistaxis, gingival bleeding, hematochezia, hemoptysis, melena, or hematuria. He is here today for evaluation and a more detailed discussion about his current condition and treatment options.    MEDICAL HISTORY: Past Medical History:  Diagnosis Date  . Arthritis    "a little in my right knee" (07/14/2016)  . CAD (coronary artery disease)   . 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 Complication of anesthesia    Pt reports "they have a hard time waking me up"  . GERD (gastroesophageal reflux disease)    hx  . Headache    "with every heart issue that I have" (07/14/2016)  . High cholesterol   . History of hiatal hernia 1990s   "fixed it w/scope down my throat"  . Hypertension   . Myocardial infarction [facility]) 05/2013   stents: left PDA, 1st OM at [facility]  . Obesity   . Persistent atrial fibrillation ([place])   . Pneumonia ~ 1992  . QT prolongation 07/15/2016  . Seasonal allergies    "I take Allegra prn" (07/14/2016)  . Tobacco abuse 12/16/2016    ALLERGIES:  is allergic to crestor [rosuvastatin]; dilaudid [hydromorphone]; isosorbide; lexapro [escitalopram]; lipitor [atorvastatin calcium]; erythromycin; and oxycodone.  MEDICATIONS:  Current Outpatient Medications  Medication Sig Dispense Refill  . acetaminophen (TYLENOL) 500 MG tablet Take 500 mg by mouth 2 (two) times daily as needed for moderate pain.    Marland Kitchen amLODipine (NORVASC) 10 MG tablet Take 1 tablet (10 mg total) by mouth daily. 90 tablet 1  . apixaban (ELIQUIS) 5 MG TABS tablet Take 1 tablet (5 mg total) by mouth 2 (two) times daily. 180 tablet 3  . aspirin EC 81 MG tablet Take 1 tablet (81 mg total) by mouth daily. 90 tablet 3  . bisacodyl (DULCOLAX) 5 MG EC tablet Take 5 mg by mouth daily as needed.     . docusate sodium (PX DOCUSATE SODIUM) 100 MG capsule Take 100 mg by mouth 2 (two) times daily as needed.     Marland Kitchen  ezetimibe (ZETIA) 10 MG tablet Take 1 tablet (10 mg total) by mouth daily. 90 tablet 0  . fexofenadine (ALLEGRA) 180 MG tablet Take 180 mg by mouth daily as needed (seasonal allergies).     . hydrALAZINE (APRESOLINE) 50 MG tablet Take 1 tablet (50 mg total) by mouth 3 (three) times daily. 90 tablet 2  . metoprolol tartrate (LOPRESSOR) 100 MG tablet Take 1 tablet (100 mg total) by mouth 2 (two) times daily. 60 tablet 2  . nitroGLYCERIN (NITROSTAT)  0.4 MG SL tablet Place 1 tablet (0.4 mg total) under the tongue every 5 (five) minutes x 3 doses as needed for chest pain. 25 tablet 2  . rosuvastatin (CRESTOR) 20 MG tablet Take 1 tablet (20 mg total) by mouth daily. 30 tablet 2  . shark liver oil-cocoa butter (PREPARATION H) 0.25-3-85.5 % suppository Place 1 suppository rectally 2 (two) times daily as needed for hemorrhoids.    . torsemide (DEMADEX) 20 MG tablet Take 20 mg by mouth as needed.      Current Facility-Administered Medications  Medication Dose Route Frequency Provider Last Rate Last Admin  . sacubitril-valsartan (ENTRESTO) 49-51 mg per tablet  1 tablet Oral BID [name], MD        SURGICAL HISTORY:  Past Surgical History:  Procedure Laterality Date  . APPENDECTOMY  1984  . CARDIOVERSION N/A 05/27/2016   Procedure: CARDIOVERSION;  Surgeon: [name], MD;  Location: [facility] ENDOSCOPY;  Service: Cardiovascular;  Laterality: N/A;  . CARDIOVERSION N/A 06/11/2016   Procedure: CARDIOVERSION;  Surgeon: [name], MD;  Location: [place];  Service: Cardiovascular;  Laterality: N/A;  . CORONARY ANGIOPLASTY WITH STENT PLACEMENT  05/30/2013   "2 stents put in at [facility]" & /stent card  . CORONARY STENT INTERVENTION N/A 11/27/2017   Procedure: CORONARY STENT INTERVENTION;  Surgeon: [name], MD;  Location: [place] [facility];  Service: Cardiovascular;  Laterality: N/A;  . CORONARY STENT INTERVENTION N/A 03/21/2019   Procedure: CORONARY STENT INTERVENTION;  Surgeon: [name], MD;  Location: [place] [facility];  Service: Cardiovascular;  Laterality: N/A;  . ELECTROPHYSIOLOGIC STUDY N/A 11/13/2016   Procedure: Atrial Fibrillation Ablation;  Surgeon: [name], MD;  Location: [place] [facility];  Service: Cardiovascular;  Laterality: N/A;  . ESOPHAGOGASTRODUODENOSCOPY (EGD) WITH ESOPHAGEAL DILATION  1990s X 2  . INTRAVASCULAR PRESSURE WIRE/FFR STUDY N/A 09/21/2018   Procedure: INTRAVASCULAR PRESSURE WIRE/FFR STUDY;  Surgeon: [name],  MD;  Location: [place] [facility];  Service: Cardiovascular;  Laterality: N/A;  . INTRAVASCULAR PRESSURE WIRE/FFR STUDY N/A 03/21/2019   Procedure: INTRAVASCULAR PRESSURE WIRE/FFR STUDY;  Surgeon: [name], MD;  Location: [place] [facility];  Service: Cardiovascular;  Laterality: N/A;  . INTRAVASCULAR PRESSURE WIRE/FFR STUDY N/A 01/24/2020   Procedure: INTRAVASCULAR PRESSURE WIRE/FFR STUDY;  Surgeon: [name], MD;  Location: [place] [facility];  Service: Cardiovascular;  Laterality: N/A;  . KNEE ARTHROSCOPY Right 1986; ~ 1995 X 2  . LAPAROSCOPIC CHOLECYSTECTOMY  2015  . LEFT HEART CATH AND CORONARY ANGIOGRAPHY N/A 11/27/2017   Procedure: LEFT HEART CATH AND CORONARY ANGIOGRAPHY;  Surgeon: [name], MD;  Location: [place] [facility];  Service: Cardiovascular;  Laterality: N/A;  . LEFT HEART CATH AND CORONARY ANGIOGRAPHY N/A 09/21/2018   Procedure: LEFT HEART CATH AND CORONARY ANGIOGRAPHY;  Surgeon: [name], MD;  Location: [place] [facility];  Service: Cardiovascular;  Laterality: N/A;  . LEFT HEART CATH AND CORONARY ANGIOGRAPHY N/A 03/21/2019   Procedure: LEFT HEART CATH AND  CORONARY ANGIOGRAPHY;  Surgeon: [name], MD;  Location: [place] [facility];  Service: Cardiovascular;  Laterality: N/A;  . REPAIR OF ESOPHAGUS  1998   perforation of the distal esophagus from bad heartburn  . RIGHT/LEFT HEART CATH AND CORONARY ANGIOGRAPHY N/A 01/24/2020   Procedure: RIGHT/LEFT HEART CATH AND CORONARY ANGIOGRAPHY;  Surgeon: [name], MD;  Location: [place] [facility];  Service: Cardiovascular;  Laterality: N/A;  . TEE WITHOUT CARDIOVERSION N/A 11/13/2016   Procedure: TRANSESOPHAGEAL ECHOCARDIOGRAM (TEE);  Surgeon: Thayer Headings, MD;  Location: [place];  Service: Cardiovascular;  Laterality: N/A;    REVIEW OF SYSTEMS:   Review of Systems  Constitutional: Negative for appetite change, chills, fatigue, fever and unexpected weight change.  HENT:   Negative for mouth sores, nosebleeds, sore throat  and trouble swallowing.   Eyes: Negative for eye problems and icterus.  Respiratory: Negative for cough, hemoptysis, shortness of breath and wheezing.   Cardiovascular: Negative for chest pain and leg swelling.  Gastrointestinal: Negative for abdominal pain, constipation, diarrhea, nausea and vomiting.  Genitourinary: Negative for bladder incontinence, difficulty urinating, dysuria, frequency and hematuria.   Musculoskeletal: Negative for back pain, gait problem, neck pain and neck stiffness.  Skin: Negative for itching and rash.  Neurological: Negative for dizziness, extremity weakness, gait problem, headaches, light-headedness and seizures.  Hematological: Negative for adenopathy. Does not bruise/bleed easily.  Psychiatric/Behavioral: Negative for confusion, depression and sleep disturbance. The patient is not nervous/anxious.     PHYSICAL EXAMINATION:  There were no vitals taken for this visit.  ECOG PERFORMANCE STATUS: {CHL ONC ECOG [id number]  Physical Exam  Constitutional: Oriented to person, place, and time and well-developed, well-nourished, and in no distress. No distress.  HENT:  Head: Normocephalic and atraumatic.  Mouth/Throat: Oropharynx is clear and moist. No oropharyngeal exudate.  Eyes: Conjunctivae are normal. Right eye exhibits no discharge. Left eye exhibits no discharge. No scleral icterus.  Neck: Normal range of motion. Neck supple.  Cardiovascular: Normal rate, regular rhythm, normal heart sounds and intact distal pulses.   Pulmonary/Chest: Effort normal and breath sounds normal. No respiratory distress. No wheezes. No rales.  Abdominal: Soft. Bowel sounds are normal. Exhibits no distension and no mass. There is no tenderness.  Musculoskeletal: Normal range of motion. Exhibits no edema.  Lymphadenopathy:    No cervical adenopathy.  Neurological: Alert and oriented to person, place, and time. Exhibits normal muscle tone. Gait normal. Coordination normal.   Skin: Skin is warm and dry. No rash noted. Not diaphoretic. No erythema. No pallor.  Psychiatric: Mood, memory and judgment normal.  Vitals reviewed.  LABORATORY DATA: Lab Results  Component Value Date   WBC 9.6 02/01/2020   HGB 17.6 (H) 02/01/2020   HCT 51.5 02/01/2020   MCV 83.6 02/01/2020   PLT 259 02/01/2020      Chemistry      Component Value Date/Time   NA 137 02/01/2020 1301   NA 139 01/02/2020 1445   K 3.8 02/01/2020 1301   CL 105 02/01/2020 1301   CO2 23 02/01/2020 1301   BUN 12 02/01/2020 1301   BUN 12 01/02/2020 1445   CREATININE 0.79 02/01/2020 1301   CREATININE 0.79 10/29/2016 1108      Component Value Date/Time   CALCIUM 8.8 ([place]) 02/01/2020 1301   ALKPHOS 140 (H) 02/01/2020 1301   AST 18 02/01/2020 1301   ALT 27 02/01/2020 1301   BILITOT 0.4 02/01/2020 1301       RADIOGRAPHIC STUDIES:  CARDIAC CATHETERIZATION  Result  Date: 01/25/2020 Left and right heart catheterization 01/24/2020: Right heart: RA 18/18, mean 17; RV 44/10, EDP 15; PA 48/19, mean 33.  PA saturation 72%.  PW 25/26, mean 23 mmHg.  Aortic saturation 98%. Cardiac output 4.73, cardiac index 2.09, reduced.  QP/QS 1.00. Impression: Mild decrease in cardiac output and cardiac index.  Mild to moderate pulmonary hypertension related to elevated LVEDP. Left heart catheterization: LV pressure 139/17, EDP 31 mmHg.  Aortic pressure 144/97, mean 122 mmHg.  No pressure gradient. Right coronary artery is nondominant but a large vessel with a proximal 70% stenosis unchanged from cardiac catheterization previously. Left main is large.  It trifurcates. Circumflex: Dominant vessel.  Previously placed stent in the proximal to mid circumflex, OM1 and also PDA widely patent. Ramus intermediate: Large vessel, mild disease. LAD: Large vessel, diffuse 60 to 70% in some views appears to be almost 80% proximal long segment stenosis.  DFR 0.91, FFR 0.89,  not hemodynamically significant.  Lesion slightly progressed compared  to prior angiography in 2020. Overall impression: Nonischemic cardiomyopathy, probably related to underlying atrial fibrillation and hypertensive heart disease.  No significant change in coronary anatomy, has moderate underlying disease in the LAD but not hemodynamically significant and previously placed stent in the circumflex widely patent.  Would consider atrial fibrillation ablation to evaluate and see whether his LVEF would improve.  Aggressive medical management of chronic systolic and diastolic heart failure, including weight loss and aggressive blood pressure control would also help.  95 mill contrast utilized.    ASSESSMENT/PLAN:  This is a very pleasant 54 year old Caucasian male referred to the clinic for polycythemia.  Likely reactive in nature secondary to his heart disease and tobacco use. He is jak2 negative.   The patient was seen with Dr. Julien Nordmann today. Labs were reviewed. He had a repeat CBC today which showed __.   Dr.  Julien Nordmann recommends __  Aspirin  Smoking cessation and hand outs   The patient was advised to call immediately if he has any concerning symptoms in the interval. The patient voices understanding of current disease status and treatment options and is in agreement with the current care plan. All questions were answered. The patient knows to call the clinic with any problems, questions or concerns. We can certainly see the patient much sooner if necessary         No orders of the defined types were placed in this encounter.    Danny Kelley Lanesha Azzaro, PA-C 02/15/20

## 2020-02-16 ENCOUNTER — Inpatient Hospital Stay: Payer: Medicare Other | Attending: Physician Assistant | Admitting: Physician Assistant

## 2020-02-16 ENCOUNTER — Inpatient Hospital Stay: Payer: Medicare Other

## 2020-02-22 NOTE — Progress Notes (Deleted)
Primary Physician/Referring:  [name], NP  Patient ID: Danny Kelley, male    DOB: [birth year], 54 y.o.   MRN: [id number]  No chief complaint on file.  HPI:    Danny Kelley  is a 54 y.o. Caucasian male patient with h/o unstable angina pectoris on 11/26/2017, angioplasty to superdominant left circumflex coronary artery with 4.5 mm resolute DES in the midsegment, patent stent placed in Distal Cx and OM 1 in 2014, 03/22/2019, stenting to high-grade stenosis in the distal dominant Circumflex PDA branch. He has hypertension, hyperlipidemia, OSA on CPAP and compliant, tobacco use, quit in March 2017, h/o TIA in Feb 2018 while on anticoagulants, paroxysmal Atrial fib S/P ablation 11/13/2016 by Dr. Rayann Heman, now in permanent atrial fibrillation with no symptoms, seizure disorder and presently on Leveracetam since Sept 2019, prior evaluation for syncope which I felt was psychogenic presents for evaluation of A. Fib and hypertension and chest pain.  ***He has now been evaluated by Dr. Rayann Heman again on 02/06/2020 and has been recommended atrial fibrillation ablation.  After my recent cardiac catheterization on 01/24/2020 revealing no change in coronary anatomy, suspect his cardiomyopathy may have been related to underlying A. Fib.  His wife was on the phone, they state that clonidine is not helping with blood pressure and also has affected his emotional status and has anger outbursts.  He also complains of aches in his legs since being back on Crestor.  Symptoms of fatigue and dyspnea has improved and he has started to feel better overall.  Blood pressure is still uncontrolled.  Past Medical History:  Diagnosis Date  . Arthritis    "a little in my right knee" (07/14/2016)  . CAD (coronary artery disease)   . Chronic congestive heart failure with left ventricular diastolic dysfunction ([place])   . Complication of anesthesia    Pt reports "they have a hard time waking me up"  . GERD (gastroesophageal reflux  disease)    hx  . Headache    "with every heart issue that I have" (07/14/2016)  . High cholesterol   . History of hiatal hernia 1990s   "fixed it w/scope down my throat"  . Hypertension   . Myocardial infarction [facility]) 05/2013   stents: left PDA, 1st OM at [facility]  . Obesity   . Persistent atrial fibrillation ([place])   . Pneumonia ~ 1992  . QT prolongation 07/15/2016  . Seasonal allergies    "I take Allegra prn" (07/14/2016)  . Tobacco abuse 12/16/2016   Past Surgical History:  Procedure Laterality Date  . APPENDECTOMY  1984  . CARDIOVERSION N/A 05/27/2016   Procedure: CARDIOVERSION;  Surgeon: [name], MD;  Location: [facility] ENDOSCOPY;  Service: Cardiovascular;  Laterality: N/A;  . CARDIOVERSION N/A 06/11/2016   Procedure: CARDIOVERSION;  Surgeon: [name], MD;  Location: [place];  Service: Cardiovascular;  Laterality: N/A;  . CORONARY ANGIOPLASTY WITH STENT PLACEMENT  05/30/2013   "2 stents put in at [facility]" & /stent card  . CORONARY STENT INTERVENTION N/A 11/27/2017   Procedure: CORONARY STENT INTERVENTION;  Surgeon: [name], MD;  Location: [place] CV LAB;  Service: Cardiovascular;  Laterality: N/A;  . CORONARY STENT INTERVENTION N/A 03/21/2019   Procedure: CORONARY STENT INTERVENTION;  Surgeon: [name], MD;  Location: [place] CV LAB;  Service: Cardiovascular;  Laterality: N/A;  . ELECTROPHYSIOLOGIC STUDY N/A 11/13/2016   Procedure: Atrial Fibrillation Ablation;  Surgeon: [name], MD;  Location: [place] CV LAB;  Service:  Cardiovascular;  Laterality: N/A;  . ESOPHAGOGASTRODUODENOSCOPY (EGD) WITH ESOPHAGEAL DILATION  1990s X 2  . INTRAVASCULAR PRESSURE WIRE/FFR STUDY N/A 09/21/2018   Procedure: INTRAVASCULAR PRESSURE WIRE/FFR STUDY;  Surgeon: [name], MD;  Location: [place] CV LAB;  Service: Cardiovascular;  Laterality: N/A;  . INTRAVASCULAR PRESSURE WIRE/FFR STUDY N/A 03/21/2019   Procedure: INTRAVASCULAR PRESSURE WIRE/FFR STUDY;  Surgeon:  [name], MD;  Location: [place] CV LAB;  Service: Cardiovascular;  Laterality: N/A;  . INTRAVASCULAR PRESSURE WIRE/FFR STUDY N/A 01/24/2020   Procedure: INTRAVASCULAR PRESSURE WIRE/FFR STUDY;  Surgeon: [name], MD;  Location: [place] CV LAB;  Service: Cardiovascular;  Laterality: N/A;  . KNEE ARTHROSCOPY Right 1986; ~ 1995 X 2  . LAPAROSCOPIC CHOLECYSTECTOMY  2015  . LEFT HEART CATH AND CORONARY ANGIOGRAPHY N/A 11/27/2017   Procedure: LEFT HEART CATH AND CORONARY ANGIOGRAPHY;  Surgeon: [name], MD;  Location: [place] CV LAB;  Service: Cardiovascular;  Laterality: N/A;  . LEFT HEART CATH AND CORONARY ANGIOGRAPHY N/A 09/21/2018   Procedure: LEFT HEART CATH AND CORONARY ANGIOGRAPHY;  Surgeon: [name], MD;  Location: [place] CV LAB;  Service: Cardiovascular;  Laterality: N/A;  . LEFT HEART CATH AND CORONARY ANGIOGRAPHY N/A 03/21/2019   Procedure: LEFT HEART CATH AND CORONARY ANGIOGRAPHY;  Surgeon: [name], MD;  Location: [place] CV LAB;  Service: Cardiovascular;  Laterality: N/A;  . REPAIR OF ESOPHAGUS  1998   perforation of the distal esophagus from bad heartburn  . RIGHT/LEFT HEART CATH AND CORONARY ANGIOGRAPHY N/A 01/24/2020   Procedure: RIGHT/LEFT HEART CATH AND CORONARY ANGIOGRAPHY;  Surgeon: [name], MD;  Location: [place] CV LAB;  Service: Cardiovascular;  Laterality: N/A;  . TEE WITHOUT CARDIOVERSION N/A 11/13/2016   Procedure: TRANSESOPHAGEAL ECHOCARDIOGRAM (TEE);  Surgeon: Thayer Headings, MD;  Location: [facility] ENDOSCOPY;  Service: Cardiovascular;  Laterality: N/A;   Family History  Problem Relation Age of Onset  . Heart disease Mother 83       CABG age 9  . Breast cancer Mother   . Tongue cancer Mother   . Diabetes Mother   . Heart attack Father 7       Father had around 7 heart attacks per pt  . Diabetes Father   . Lung cancer Father   . Congestive Heart Failure Father   . Lung cancer Paternal Uncle     Social History   Tobacco Use  .  Smoking status: Former Smoker    Years: 25.00    Types: Cigarettes, E-cigarettes    Quit date: 01/15/2020    Years since quitting: 0.1  . Smokeless tobacco: Never Used  . Tobacco comment: 5 cigs every 3 days  Substance Use Topics  . Alcohol use: No   ROS  Review of Systems  Constitution: Positive for malaise/fatigue and weight gain. Negative for decreased appetite and weight loss.  Cardiovascular: Positive for dyspnea on exertion. Negative for chest pain, leg swelling and orthopnea.  Gastrointestinal: Positive for diarrhea. Negative for abdominal pain.  Neurological: Negative for headaches.  Psychiatric/Behavioral: Positive for depression. Negative for suicidal ideas. The patient is nervous/anxious.   All other systems reviewed and are negative.  Objective  There were no vitals taken for this visit.  Vitals with BMI 02/15/2020 02/06/2020 02/06/2020  Height - - 5\' 10"   Weight - - 253 lbs  BMI - - Q000111Q  Systolic A999333 Q000111Q A999333  Diastolic 123456 98 A999333  Pulse - 71 64     Physical Exam  Constitutional:  Vital signs are normal.  Moderately obese  Cardiovascular: Normal rate, regular rhythm, normal heart sounds and intact distal pulses.  No leg edema, no JVD.  Pulmonary/Chest: Effort normal and breath sounds normal. No accessory muscle usage. No respiratory distress.  Abdominal: Soft. Bowel sounds are normal.  Musculoskeletal:        General: Normal range of motion.  Vitals reviewed.  Laboratory examination:   Recent Labs    03/18/19 1040 03/21/19 0615 01/02/20 1445 01/24/20 1500 01/24/20 1501 01/24/20 1523 02/01/20 1301  NA   < > 136 139   < > 140 139 137  K   < > 3.6 4.4   < > 3.7 3.6 3.8  CL  --  103 98  --   --   --  105  CO2  --  24 26  --   --   --  23  GLUCOSE  --  110* 151*  --   --   --  138*  BUN  --  13 12  --   --   --  12  CREATININE  --  0.92 0.88  --   --   --  0.79  CALCIUM  --  8.3* 9.4  --   --   --  8.8*  GFRNONAA  --  >60 99  --   --   --  >60  GFRAA  --   >60 115  --   --   --  >60   < > = values in this interval not displayed.   CrCl cannot be calculated (Patient's most recent lab result is older than the maximum 21 days allowed.).  CMP Latest Ref Rng & Units 02/01/2020 01/24/2020 01/24/2020  Glucose 70 - 99 mg/dL 138(H) - -  BUN 6 - 20 mg/dL 12 - -  Creatinine 0.61 - 1.24 mg/dL 0.79 - -  Sodium 135 - 145 mmol/L 137 139 140  Potassium 3.5 - 5.1 mmol/L 3.8 3.6 3.7  Chloride 98 - 111 mmol/L 105 - -  CO2 22 - 32 mmol/L 23 - -  Calcium 8.9 - 10.3 mg/dL 8.8(L) - -  Total Protein 6.5 - 8.1 g/dL 7.4 - -  Total Bilirubin 0.3 - 1.2 mg/dL 0.4 - -  Alkaline Phos 38 - 126 U/L 140(H) - -  AST 15 - 41 U/L 18 - -  ALT 0 - 44 U/L 27 - -   CBC Latest Ref Rng & Units 02/01/2020 01/24/2020 01/24/2020  WBC 4.0 - 10.5 K/uL 9.6 - -  Hemoglobin 13.0 - 17.0 g/dL 17.6(H) 15.6 16.3  Hematocrit 39.0 - 52.0 % 51.5 46.0 48.0  Platelets 150 - 400 K/uL 259 - -   Lipid Panel     Component Value Date/Time   CHOL 239 (H) 01/02/2020 1445   TRIG 143 01/02/2020 1445   HDL 62 01/02/2020 1445   CHOLHDL 5.0 11/27/2017 0634   VLDL 16 11/27/2017 0634   LDLCALC 152 (H) 01/02/2020 1445   HEMOGLOBIN A1C Lab Results  Component Value Date   HGBA1C 6.0 (H) 11/27/2017   MPG 125.5 11/27/2017   TSH Recent Labs    01/02/20 1445  TSH 2.080   Medications and allergies   Allergies  Allergen Reactions  . Crestor [Rosuvastatin] Other (See Comments)    Severe leg cramps  . Dilaudid [Hydromorphone] Itching and Swelling  . Isosorbide     Muscle pain and cramps  . Lexapro [Escitalopram]     Have bad thoughts made  anxiety worse   . Lipitor [Atorvastatin Calcium] Other (See Comments)    myalgias  . Erythromycin Other (See Comments)    Hallucinations   . Oxycodone Itching and Rash    Swelling to face, hands, mouth Completley intolerant to any meds with oxycodone in it      Current Outpatient Medications  Medication Instructions  . acetaminophen (TYLENOL) 500 mg,  Oral, 2 times daily PRN  . amLODipine (NORVASC) 10 mg, Oral, Daily  . apixaban (ELIQUIS) 5 mg, Oral, 2 times daily  . aspirin EC 81 mg, Oral, Daily  . bisacodyl (DULCOLAX) 5 mg, Oral, Daily PRN  . docusate sodium (PX DOCUSATE SODIUM) 100 mg, Oral, 2 times daily PRN  . ezetimibe (ZETIA) 10 mg, Oral, Daily  . fexofenadine (ALLEGRA) 180 mg, Oral, Daily PRN  . hydrALAZINE (APRESOLINE) 50 mg, Oral, 3 times daily  . metoprolol tartrate (LOPRESSOR) 100 mg, Oral, 2 times daily  . nitroGLYCERIN (NITROSTAT) 0.4 mg, Sublingual, Every 5 min x3 PRN  . rosuvastatin (CRESTOR) 20 mg, Oral, Daily  . shark liver oil-cocoa butter (PREPARATION H) 0.25-3-85.5 % suppository 1 suppository, Rectal, 2 times daily PRN  . torsemide (DEMADEX) 20 mg, Oral, As needed    Radiology:    ***CT Chest with Ca Score 02/15/2020: Vascular: Heart is at the upper limits of normal in size to mildly enlarged. No pericardial effusion. COMPARISON:  11/10/2016. FINDINGS: Vascular: Heart is at the upper limits of normal in size to mildly enlarged. No pericardial effusion. Mediastinum/Nodes: None. Lungs/Pleura: Calcified right upper lobe granuloma. Visualized portions of the lungs are otherwise clear. No pleural fluid. Upper Abdomen: None. Musculoskeletal: Degenerative changes in the spine. IMPRESSION: No acute extracardiac findings.  Cardiac Studies:   Sleep study X 2 negative   Direct current cardioversion ( Ist 05/27/16) 06/11/2016: 150x2, 200 x1 to NSR on Sotalol. A. Fib ablation by Dr. [name] on 11/13/2016  Event Monitor 30 days 07/28/2018: Paroxysmal episodes of atrial fibrillation with controlled ventricular response was noted along with PVCs. Some of this symptoms of chest pain, palpitations and dizziness correlated with atrial fibrillation, others showed normal sinus rhythm. No heart block, no other significant arrhythmias.  Echocardiogram 12/28/2019:  Severely depressed LV systolic function with visual EF  25-30%. Severe  global hypokinesis Left ventricle cavity is normal in size. Moderate left  ventricular hypertrophy. Doppler evidence of grade II diastolic  dysfunction, elevated LAP. Calculated EF 29%.  No significant valvular abnormalities.  Left atrial cavity is mildly dilated.  Right ventricle cavity is normal in size but visually low normal right  ventricular function.  The aortic root is dilated, 4.0cm at the level of sinus of Valsalva.  Proximal ascending aorta not well visualized.  Prior study dated 11/27/2017: Normal LVEF. Mild LVH. Normal diastolic  parameters. Mild LA enlargement.  Left and right heart catheterization 01/24/2020: Right heart: RA 18/18, mean 17; RV 44/10, EDP 15; PA 48/19, mean 33.  PA saturation 72%.  PW 25/26, mean 23 mmHg.  Aortic saturation 98%. Cardiac output 4.73, cardiac index 2.09, reduced.  QP/QS 1.00. Impression: Mild decrease in cardiac output and cardiac index.  Mild to moderate pulmonary hypertension related to elevated LVEDP.  Left heart catheterization: LV pressure 139/17, EDP 31 mmHg.  Aortic pressure 144/97, mean 122 mmHg.  No pressure gradient. Right coronary artery is nondominant but a large vessel with a proximal 70% stenosis unchanged from cardiac catheterization on 03/21/2019. Left main is large.  It trifurcates. Circumflex: Dominant vessel.  Previously placed stent in the  proximal to mid circumflex, OM1 and also PDA widely patent. Ramus intermediate: Large vessel, mild disease. LAD: Large vessel, diffuse 60 to 70% in some views appears to be almost 80% proximal long segment stenosis.  DFR 0.91, FFR 0.89,  not hemodynamically significant.  Lesion slightly progressed compared to prior angiography in 2020.  Overall impression: Nonischemic cardiomyopathy, probably related to underlying atrial fibrillation and hypertensive heart disease.  No significant change in coronary anatomy, has moderate underlying disease in the LAD but not hemodynamically  significant and previously placed stent in the circumflex widely patent (Circumflex stent placed 11/27/2017, 4.5 x 18 mm resolute widely patent, patent 3.5 x 18 mm Xience DES to dominant distal left circumflex, 2.5 x 12 mm Xience to OM1 placed 05/30/2013.)   Recommendation: Would consider atrial fibrillation ablation to evaluate and see whether his LVEF would improve.  Aggressive medical management of chronic systolic and diastolic heart failure, including weight loss and aggressive blood pressure control would also help.  95 mill contrast utilized.  EKG 03/31/2019: Atrial fibrillation controlled ventricular response at the rate of 70 bpm, normal axis.   EKG 01/04/2020: Atrial fibrillation with rapid ventricular response at the rate of 111 bpm, normal axis, diffuse ST-T wave abnormality, cannot exclude inferior ischemia, anterolateral ischemia.  Normal QT interval.   No significant change from  EKG 12/20/2019: Atrial fibrillation with RVR at rate of 121 bpm   Assessment     ICD-10-CM   1. Chronic combined systolic and diastolic heart failure (HCC)  I50.42   2. Other persistent atrial fibrillation ([place]). CHA2DS2-VASc Score is 5. Yearly risk of stroke: 6.7% (HTN, Vasc Dz, CHF, TIA).  I48.19   3. Essential hypertension  I10   4. Polycythemia  D75.1     No orders of the defined types were placed in this encounter.   There are no discontinued medications.   Recommendations:   Danny Kelley  is a  54 y.o. male  with CAD, unstable angina pectoris on 11/26/2017, angioplasty to superdominant left circumflex coronary artery with 4.5 mm resolute DES in the midsegment, patent stent placed in Distal Cx and OM 1 in 2014, 03/22/2019, stenting to high-grade stenosis in the distal dominant Circumflex PDA branch. He has hypertension, hyperlipidemia, OSA on CPAP and compliant, tobacco use, quit in March 2017, h/o TIA in Feb 2018 while on anticoagulants, paroxysmal Atrial fib S/P ablation 11/13/2016 by Dr. Rayann Heman,  (Multaq & Sotalol ineffective), now in persistent atrial fibrillation, ?seizure (pseudoseizure) disorder and presently on Leveracetam since Sept 2019, prior evaluation for syncope which I felt was psychogenic presents for evaluation of A. Fib and hypertension and chest pain.    ***He has now been evaluated by Dr. Rayann Heman again 02/06/20 and has been recommended atrial fibrillation ablation.  After my recent cardiac catheterization on 01/24/2020 revealing no change in coronary anatomy, suspect his cardiomyopathy may have been related to underlying A. Fib.  Blood pressure is now much improved but still uncontrolled, I will start him on hydralazine 50 mg p.o. 3 times daily, due to side effects of clonidine with emotional outburst and frequency, will discontinue this gradually, patient will take 1/2 tablet twice daily for a week.  In view of chronic systolic and diastolic heart failure, I will also start him on Entresto 24/26 1 p.o. twice daily for a week followed by 49/51 mg twice daily, samples of the same were given to the patient, he will obtain BMP in 10 days, I would like to see him back in 2  to 3 weeks, just prior to his atrial fibrillation ablation. He is not in acute decompensated heart failure.  Weight loss again discussed with the patient.  He is now back on Crestor, now developing mild myalgias, advised him to continue Crestor for now and will address this once his cardiac status is stable.  During recent lab work, he was also noted to have elevated hemoglobin, may be related to prior tobacco use and COPD but needs to be followed through.  He is presently on anticoagulation, continue the same for atrial fibrillation.  This is a 40-minute encounter with patient and his wife over the telephone, review of his consultation with Dr. Rayann Heman and making therapeutic decisions regarding his cardiac conditions.   [name], MD, [facility] 02/22/2020, 3:11 PM [facility] [place] Pager: [phone] Office:  [phone] If no answer Cell [phone]

## 2020-02-23 ENCOUNTER — Ambulatory Visit: Payer: Medicare Other | Admitting: Cardiology

## 2020-02-23 ENCOUNTER — Telehealth: Payer: Self-pay | Admitting: Physician Assistant

## 2020-02-23 DIAGNOSIS — I4819 Other persistent atrial fibrillation: Secondary | ICD-10-CM

## 2020-02-23 DIAGNOSIS — I5042 Chronic combined systolic (congestive) and diastolic (congestive) heart failure: Secondary | ICD-10-CM

## 2020-02-23 DIAGNOSIS — D751 Secondary polycythemia: Secondary | ICD-10-CM

## 2020-02-23 DIAGNOSIS — I10 Essential (primary) hypertension: Secondary | ICD-10-CM

## 2020-02-23 NOTE — Telephone Encounter (Signed)
The patient had missed his follow up appointment last week to review the results of his molecular studies. Was calling the patient to review the results but it went to his voicemail. His results were unremarkable. His polycythemia likely is reactive to his tobacco use and cardiopulmonary disease. He does not need to follow up with [place] and can continue to follow for routine blood work through his other providers. No further work up is necessary. Advised him to call us back if he would like more information to discuss this further.

## 2020-02-28 ENCOUNTER — Other Ambulatory Visit (HOSPITAL_COMMUNITY)
Admission: RE | Admit: 2020-02-28 | Discharge: 2020-02-28 | Disposition: A | Discharge status: Home / Self Care | Payer: Medicare Other | Source: Ambulatory Visit | Attending: Internal Medicine | Admitting: Internal Medicine

## 2020-02-28 DIAGNOSIS — Z20822 Contact with and (suspected) exposure to covid-19: Secondary | ICD-10-CM | POA: Insufficient documentation

## 2020-02-28 DIAGNOSIS — Z01812 Encounter for preprocedural laboratory examination: Secondary | ICD-10-CM | POA: Insufficient documentation

## 2020-02-28 LAB — SARS CORONAVIRUS 2 (TAT 6-24 HRS): SARS Coronavirus 2: NEGATIVE

## 2020-02-29 NOTE — Anesthesia Preprocedure Evaluation (Addendum)
Anesthesia Evaluation  Patient identified by MRN, date of birth, ID band Patient awake    Reviewed: Allergy & Precautions, NPO status , Patient's Chart, lab work & pertinent test results, reviewed documented beta blocker date and time   History of Anesthesia Complications Negative for: history of anesthetic complications  Airway Mallampati: III  TM Distance: >3 FB Neck ROM: Full    Dental  (+) Dental Advisory Given, Poor Dentition, Loose,    Pulmonary shortness of breath, sleep apnea , Current Smoker, former smoker,    breath sounds clear to auscultation       Cardiovascular hypertension, Pt. on medications and Pt. on home beta blockers + angina + CAD, + Past MI and +CHF  + dysrhythmias Atrial Fibrillation  Rhythm:Irregular Rate:Normal  Echocardiogram 12/28/2019:  Severely depressed LV systolic function with visual EF 25-30%. Severe  global hypokinesis Left ventricle cavity is normal in size. Moderate left ventricular hypertrophy. Doppler evidence of grade II diastolic  dysfunction, elevated LAP. Calculated EF 29%. No significant valvular abnormalities. Left atrial cavity is mildly dilated. Right ventricle cavity is normal in size but visually low normal right  ventricular function. The aortic root is dilated, 4.0cm at the level of sinus of Valsalva.  Proximal ascending aorta not well visualized. Prior study dated 11/27/2017: Normal LVEF. Mild LVH. Normal diastolic  parameters. Mild LA enlargement.   Left and right heart catheterization 01/24/2020: Right heart: RA 18/18, mean 17; RV 44/10, EDP 15; PA 48/19, mean 33.  PA saturation 72%.  PW 25/26, mean 23 mmHg.  Aortic saturation 98%. Cardiac output 4.73, cardiac index 2.09, reduced.  QP/QS 1.00. Impression: Mild decrease in cardiac output and cardiac index.  Mild to moderate pulmonary hypertension related to elevated LVEDP.  Left heart catheterization: LV pressure 139/17, EDP 31 mmHg.   Aortic pressure 144/97, mean 122 mmHg.  No pressure gradient. Right coronary artery is nondominant but a large vessel with a proximal 70% stenosis unchanged from cardiac catheterization previously. Left main is large.  It trifurcates. Circumflex: Dominant vessel.  Previously placed stent in the proximal to mid circumflex, OM1 and also PDA widely patent. Ramus intermediate: Large vessel, mild disease. LAD: Large vessel, diffuse 60 to 70% in some views appears to be almost 80% proximal long segment stenosis.  DFR 0.91, FFR 0.89,  not hemodynamically significant.  Lesion slightly progressed compared to prior angiography in 2020.  Overall impression: Nonischemic cardiomyopathy, probably related to underlying atrial fibrillation and hypertensive heart disease.  No significant change in coronary anatomy, has moderate underlying disease in the LAD but not hemodynamically significant and previously placed stent in the circumflex widely patent.  Would consider atrial fibrillation ablation to evaluate and see whether his LVEF would improve.  Aggressive medical management of chronic systolic and diastolic heart failure, including weight loss and aggressive blood pressure control would also help.  95 mill contrast utilized.   Neuro/Psych  Headaches, CVA negative psych ROS   GI/Hepatic Neg liver ROS, hiatal hernia, GERD  Medicated and Controlled,  Endo/Other  Morbid obesity  Renal/GU negative Renal ROS     Musculoskeletal  (+) Arthritis ,   Abdominal (+) + obese,   Peds  Hematology   Anesthesia Other Findings   Reproductive/Obstetrics                          Anesthesia Physical  Anesthesia Plan  ASA: IV  Anesthesia Plan: General   Post-op Pain Management:    Induction: Intravenous  PONV Risk Score and Plan: 2 and Ondansetron, Dexamethasone, Treatment may vary due to age or medical condition and Midazolam  Airway Management Planned: Oral ETT  Additional  Equipment:   Intra-op Plan:   Post-operative Plan: Extubation in OR  Informed Consent: I have reviewed the patients History and Physical, chart, labs and discussed the procedure including the risks, benefits and alternatives for the proposed anesthesia with the patient or authorized representative who has indicated his/her understanding and acceptance.     Dental advisory given  Plan Discussed with: CRNA  Anesthesia Plan Comments:        Anesthesia Quick Evaluation

## 2020-03-01 ENCOUNTER — Ambulatory Visit (HOSPITAL_COMMUNITY): Payer: Medicare Other | Admitting: Anesthesiology

## 2020-03-01 ENCOUNTER — Other Ambulatory Visit: Payer: Self-pay

## 2020-03-01 ENCOUNTER — Encounter (HOSPITAL_COMMUNITY)
Admission: RE | Disposition: A | Discharge status: Home / Self Care | Payer: Medicare Other | Source: Ambulatory Visit | Attending: Internal Medicine

## 2020-03-01 ENCOUNTER — Ambulatory Visit (HOSPITAL_COMMUNITY)
Admission: RE | Admit: 2020-03-01 | Discharge: 2020-03-01 | Disposition: A | Discharge status: Home / Self Care | Payer: Medicare Other | Source: Ambulatory Visit | Attending: Internal Medicine | Admitting: Internal Medicine

## 2020-03-01 DIAGNOSIS — Z6835 Body mass index (BMI) 35.0-35.9, adult: Secondary | ICD-10-CM | POA: Insufficient documentation

## 2020-03-01 DIAGNOSIS — Z7901 Long term (current) use of anticoagulants: Secondary | ICD-10-CM | POA: Diagnosis not present

## 2020-03-01 DIAGNOSIS — I2510 Atherosclerotic heart disease of native coronary artery without angina pectoris: Secondary | ICD-10-CM | POA: Insufficient documentation

## 2020-03-01 DIAGNOSIS — I110 Hypertensive heart disease with heart failure: Secondary | ICD-10-CM | POA: Diagnosis not present

## 2020-03-01 DIAGNOSIS — I4819 Other persistent atrial fibrillation: Secondary | ICD-10-CM | POA: Diagnosis not present

## 2020-03-01 DIAGNOSIS — I5032 Chronic diastolic (congestive) heart failure: Secondary | ICD-10-CM | POA: Diagnosis not present

## 2020-03-01 DIAGNOSIS — E7800 Pure hypercholesterolemia, unspecified: Secondary | ICD-10-CM | POA: Diagnosis not present

## 2020-03-01 DIAGNOSIS — I10 Essential (primary) hypertension: Secondary | ICD-10-CM | POA: Diagnosis not present

## 2020-03-01 DIAGNOSIS — F17210 Nicotine dependence, cigarettes, uncomplicated: Secondary | ICD-10-CM | POA: Insufficient documentation

## 2020-03-01 DIAGNOSIS — I252 Old myocardial infarction: Secondary | ICD-10-CM | POA: Diagnosis not present

## 2020-03-01 DIAGNOSIS — I429 Cardiomyopathy, unspecified: Secondary | ICD-10-CM | POA: Diagnosis not present

## 2020-03-01 DIAGNOSIS — Z79899 Other long term (current) drug therapy: Secondary | ICD-10-CM | POA: Diagnosis not present

## 2020-03-01 DIAGNOSIS — K219 Gastro-esophageal reflux disease without esophagitis: Secondary | ICD-10-CM | POA: Insufficient documentation

## 2020-03-01 DIAGNOSIS — I4891 Unspecified atrial fibrillation: Secondary | ICD-10-CM | POA: Diagnosis not present

## 2020-03-01 HISTORY — PX: ATRIAL FIBRILLATION ABLATION: EP1191

## 2020-03-01 SURGERY — ATRIAL FIBRILLATION ABLATION
Anesthesia: General

## 2020-03-01 MED ORDER — PANTOPRAZOLE SODIUM 40 MG PO TBEC
40.0000 mg | DELAYED_RELEASE_TABLET | Freq: Every day | ORAL | 0 refills | Status: DC
Start: 2020-03-01 — End: 2020-12-06

## 2020-03-01 MED ORDER — FENTANYL CITRATE (PF) 250 MCG/5ML IJ SOLN
INTRAMUSCULAR | Status: DC | PRN
  Administered 2020-03-01: 25 ug via INTRAVENOUS
  Administered 2020-03-01: 50 ug via INTRAVENOUS

## 2020-03-01 MED ORDER — PROTAMINE SULFATE 10 MG/ML IV SOLN
INTRAVENOUS | Status: DC | PRN
Start: 2020-03-01 — End: 2020-03-01
  Administered 2020-03-01: 40 mg via INTRAVENOUS

## 2020-03-01 MED ORDER — PHENYLEPHRINE 40 MCG/ML (10ML) SYRINGE FOR IV PUSH (FOR BLOOD PRESSURE SUPPORT)
PREFILLED_SYRINGE | INTRAVENOUS | Status: DC | PRN
  Administered 2020-03-01: 80 ug via INTRAVENOUS

## 2020-03-01 MED ORDER — ONDANSETRON HCL 4 MG/2ML IJ SOLN
INTRAMUSCULAR | Status: DC | PRN
  Administered 2020-03-01: 4 mg via INTRAVENOUS

## 2020-03-01 MED ORDER — HEPARIN (PORCINE) IN NACL 1000-0.9 UT/500ML-% IV SOLN
INTRAVENOUS | Status: DC | PRN
  Administered 2020-03-01: 500 mL

## 2020-03-01 MED ORDER — PROPOFOL 10 MG/ML IV BOLUS
INTRAVENOUS | Status: DC | PRN
  Administered 2020-03-01: 160 mg via INTRAVENOUS
  Administered 2020-03-01: 40 mg via INTRAVENOUS

## 2020-03-01 MED ORDER — SODIUM CHLORIDE 0.9 % IV SOLN: 250.0000 mL | INTRAVENOUS | Status: DC | PRN

## 2020-03-01 MED ORDER — SODIUM CHLORIDE 0.9 % IV SOLN: INTRAVENOUS | Status: DC

## 2020-03-01 MED ORDER — ALBUTEROL SULFATE HFA 108 (90 BASE) MCG/ACT IN AERS
INHALATION_SPRAY | RESPIRATORY_TRACT | Status: DC | PRN
Start: 2020-03-01 — End: 2020-03-01
  Administered 2020-03-01 (×3): 4 via RESPIRATORY_TRACT

## 2020-03-01 MED ORDER — APIXABAN 5 MG PO TABS
5.0000 mg | ORAL_TABLET | ORAL | Status: AC
  Administered 2020-03-01: 5 mg via ORAL
  Filled 2020-03-01: qty 1

## 2020-03-01 MED ORDER — HEPARIN (PORCINE) IN NACL 1000-0.9 UT/500ML-% IV SOLN
INTRAVENOUS | Status: AC
  Filled 2020-03-01: qty 500

## 2020-03-01 MED ORDER — EPHEDRINE SULFATE-NACL 50-0.9 MG/10ML-% IV SOSY
PREFILLED_SYRINGE | INTRAVENOUS | Status: DC | PRN
  Administered 2020-03-01: 5 mg via INTRAVENOUS
  Administered 2020-03-01: 10 mg via INTRAVENOUS

## 2020-03-01 MED ORDER — ONDANSETRON HCL 4 MG/2ML IJ SOLN: 4.0000 mg | Freq: Four times a day (QID) | INTRAMUSCULAR | Status: DC | PRN

## 2020-03-01 MED ORDER — MIDAZOLAM HCL 5 MG/5ML IJ SOLN
INTRAMUSCULAR | Status: DC | PRN
  Administered 2020-03-01 (×2): 1 mg via INTRAVENOUS

## 2020-03-01 MED ORDER — HEPARIN SODIUM (PORCINE) 1000 UNIT/ML IJ SOLN
INTRAMUSCULAR | Status: DC | PRN
  Administered 2020-03-01: 5000 [IU] via INTRAVENOUS

## 2020-03-01 MED ORDER — DEXAMETHASONE SODIUM PHOSPHATE 10 MG/ML IJ SOLN
INTRAMUSCULAR | Status: DC | PRN
  Administered 2020-03-01: 10 mg via INTRAVENOUS

## 2020-03-01 MED ORDER — ROCURONIUM BROMIDE 10 MG/ML (PF) SYRINGE
PREFILLED_SYRINGE | INTRAVENOUS | Status: DC | PRN
  Administered 2020-03-01: 10 mg via INTRAVENOUS
  Administered 2020-03-01: 50 mg via INTRAVENOUS

## 2020-03-01 MED ORDER — HEPARIN SODIUM (PORCINE) 1000 UNIT/ML IJ SOLN
INTRAMUSCULAR | Status: AC
  Filled 2020-03-01: qty 2

## 2020-03-01 MED ORDER — SODIUM CHLORIDE 0.9% FLUSH: 3.0000 mL | Freq: Two times a day (BID) | INTRAVENOUS | Status: DC

## 2020-03-01 MED ORDER — ACETAMINOPHEN 325 MG PO TABS
650.0000 mg | ORAL_TABLET | ORAL | Status: DC | PRN
  Filled 2020-03-01: qty 2

## 2020-03-01 MED ORDER — SODIUM CHLORIDE 0.9% FLUSH: 3.0000 mL | INTRAVENOUS | Status: DC | PRN

## 2020-03-01 MED ORDER — PHENYLEPHRINE HCL-NACL 10-0.9 MG/250ML-% IV SOLN
INTRAVENOUS | Status: DC | PRN
  Administered 2020-03-01: 40 ug/min via INTRAVENOUS

## 2020-03-01 MED ORDER — MENTHOL 3 MG MT LOZG
1.0000 | LOZENGE | OROMUCOSAL | Status: DC | PRN
  Administered 2020-03-01: 3 mg via ORAL
  Filled 2020-03-01: qty 9

## 2020-03-01 MED ORDER — SUGAMMADEX SODIUM 200 MG/2ML IV SOLN
INTRAVENOUS | Status: DC | PRN
  Administered 2020-03-01: 225 mg via INTRAVENOUS

## 2020-03-01 MED ORDER — SUCCINYLCHOLINE CHLORIDE 20 MG/ML IJ SOLN
INTRAMUSCULAR | Status: DC | PRN
  Administered 2020-03-01: 160 mg via INTRAVENOUS

## 2020-03-01 MED ORDER — LIDOCAINE 2% (20 MG/ML) 5 ML SYRINGE
INTRAMUSCULAR | Status: DC | PRN
  Administered 2020-03-01: 100 mg via INTRAVENOUS

## 2020-03-01 MED ORDER — HEPARIN SODIUM (PORCINE) 1000 UNIT/ML IJ SOLN
INTRAMUSCULAR | Status: DC | PRN
  Administered 2020-03-01: 1000 [IU] via INTRAVENOUS
  Administered 2020-03-01: 14000 [IU] via INTRAVENOUS

## 2020-03-01 SURGICAL SUPPLY — 19 items
BLANKET WARM UNDERBOD FULL ACC (MISCELLANEOUS) ×3 IMPLANT
CATH MAPPNG PENTARAY F 2-6-2MM (CATHETERS) ×1 IMPLANT
CATH SMTCH THERMOCOOL SF DF (CATHETERS) ×3 IMPLANT
CATH SOUNDSTAR ECO 8FR (CATHETERS) ×3 IMPLANT
CATH WEBSTER BI DIR CS D-F CRV (CATHETERS) ×3 IMPLANT
COVER SWIFTLINK CONNECTOR (BAG) ×3 IMPLANT
DEVICE CLOSURE PERCLS PRGLD 6F (VASCULAR PRODUCTS) ×3 IMPLANT
NEEDLE BAYLIS TRANSSEPTAL 71CM (NEEDLE) ×3 IMPLANT
PACK EP LATEX FREE (CUSTOM PROCEDURE TRAY) ×3
PACK EP LF (CUSTOM PROCEDURE TRAY) ×1 IMPLANT
PAD PRO RADIOLUCENT 2001M-C (PAD) ×3 IMPLANT
PATCH CARTO3 (PAD) ×3 IMPLANT
PENTARAY F 2-6-2MM (CATHETERS) ×3
PERCLOSE PROGLIDE 6F (VASCULAR PRODUCTS) ×9
SHEATH PINNACLE 7F 10CM (SHEATH) ×3 IMPLANT
SHEATH PINNACLE 9F 10CM (SHEATH) ×3 IMPLANT
SHEATH PROBE COVER 6X72 (BAG) ×3 IMPLANT
SHEATH SWARTZ TS SL2 63CM 8.5F (SHEATH) ×3 IMPLANT
TUBING SMART ABLATE COOLFLOW (TUBING) ×9 IMPLANT

## 2020-03-01 NOTE — Anesthesia Procedure Notes (Signed)
Procedure Name: Intubation Date/Time: 03/01/2020 8:07 AM Performed by: Gaylene Brooks, CRNA Pre-anesthesia Checklist: Patient identified, Emergency Drugs available, Suction available and Patient being monitored Patient Re-evaluated:Patient Re-evaluated prior to induction Oxygen Delivery Method: Circle System Utilized Preoxygenation: Pre-oxygenation with 100% oxygen Induction Type: IV induction Ventilation: Mask ventilation without difficulty and Two handed mask ventilation required Laryngoscope Size: Miller and 2 Grade View: Grade II Tube type: Oral Tube size: 7.5 mm Number of attempts: 1 Airway Equipment and Method: Stylet and Oral airway Placement Confirmation: ETT inserted through vocal cords under direct vision,  positive ETCO2 and breath sounds checked- equal and bilateral Secured at: 23 cm Tube secured with: Tape Dental Injury: Teeth and Oropharynx as per pre-operative assessment  Comments: Grade 2/3 view.

## 2020-03-01 NOTE — Interval H&P Note (Signed)
History and Physical Interval Note:  03/01/2020 7:33 AM  [name]  has presented today for surgery, with the diagnosis of afib.  The various methods of treatment have been discussed with the patient and family. After consideration of risks, benefits and other options for treatment, the patient has consented to  Procedure(s): ATRIAL FIBRILLATION ABLATION (N/A) as a surgical intervention.  The patient's history has been reviewed, patient examined, no change in status, stable for surgery.  I have reviewed the patient's chart and labs.  Questions were answered to the patient's satisfaction.    Reports compliance with eliquis without interruption.  Danny Kelley

## 2020-03-01 NOTE — Discharge Instructions (Signed)
Post procedure care instructions No driving for 4 days. No lifting over 5 lbs for 1 week. No vigorous or sexual activity for 1 week. You may return to work/your usual activities on 03/08/2020. Keep procedure site clean & dry. If you notice increased pain, swelling, bleeding or pus, call/return!  You may shower, but no soaking baths/hot tubs/pools for 1 week.     You have an appointment set up with the [facility].  Multiple studies have shown that being followed by a dedicated atrial fibrillation clinic in addition to the standard care you receive from your other physicians improves health. We believe that enrollment in the atrial fibrillation clinic will allow [place] to better care for you.   The phone number to the [facility] is [phone]. The clinic is staffed Monday through Friday from 8:30am to 5pm.  Parking Directions: The clinic is located in the Heart and Vascular Building connected to [facility]. 1)From [address] turn on to [address] and go to the 3rd entrance  (Heart and Vascular entrance) on the right. 2)Look to the right for Heart &Vascular Parking Garage. 3)A code for the entrance is required, the code for May is 5008 4)Take the elevators to the 1st floor. Registration is in the room with the glass walls at the end of the hallway.  If you have any trouble parking or locating the clinic, please don't hesitate to call [phone]. Cardiac Ablation, Care After  This sheet gives you information about how to care for yourself after your procedure. Your health care provider may also give you more specific instructions. If you have problems or questions, contact your health care provider. What can I expect after the procedure? After the procedure, it is common to have:  Bruising around your puncture site.  Tenderness around your puncture site.  Skipped heartbeats.  Tiredness (fatigue).  Follow these instructions at home: Puncture  site care   Follow instructions from your health care provider about how to take care of your puncture site. Make sure you: ? If present, leave stitches (sutures), skin glue, or adhesive strips in place. These skin closures may need to stay in place for up to 2 weeks. If adhesive strip edges start to loosen and curl up, you may trim the loose edges. Do not remove adhesive strips completely unless your health care provider tells you to do that.  Check your puncture site every day for signs of infection. Check for: ? Redness, swelling, or pain. ? Fluid or blood. If your puncture site starts to bleed, lie down on your back, apply firm pressure to the area, and contact your health care provider. ? Warmth. ? Pus or a bad smell. Driving  Do not drive for at least 4 days after your procedure or however long your health care provider recommends. (Do not resume driving if you have previously been instructed not to drive for other health reasons.)  Do not drive or use heavy machinery while taking prescription pain medicine. Activity  Avoid activities that take a lot of effort for at least 7 days after your procedure.  Do not lift anything that is heavier than 5 lb (4.5 kg) for one week.   No sexual activity for 1 week.   Return to your normal activities as told by your health care provider. Ask your health care provider what activities are safe for you. General instructions  Take over-the-counter and prescription medicines only as told by your health care provider.  Do  not use any products that contain nicotine or tobacco, such as cigarettes and e-cigarettes. If you need help quitting, ask your health care provider.  You may shower after 24 hours, but Do not take baths, swim, or use a hot tub for 1 week.   Do not drink alcohol for 24 hours after your procedure.  Keep all follow-up visits as told by your health care provider. This is important. Contact a health care provider if:  You have  redness, mild swelling, or pain around your puncture site.  You have fluid or blood coming from your puncture site that stops after applying firm pressure to the area.  Your puncture site feels warm to the touch.  You have pus or a bad smell coming from your puncture site.  You have a fever.  You have chest pain or discomfort that spreads to your neck, jaw, or arm.  You are sweating a lot.  You feel nauseous.  You have a fast or irregular heartbeat.  You have shortness of breath.  You are dizzy or light-headed and feel the need to lie down.  You have pain or numbness in the arm or leg closest to your puncture site. Get help right away if:  Your puncture site suddenly swells.  Your puncture site is bleeding and the bleeding does not stop after applying firm pressure to the area. These symptoms may represent a serious problem that is an emergency. Do not wait to see if the symptoms will go away. Get medical help right away. Call your local emergency services (911 in the [place] Do not drive yourself to the hospital. Summary  After the procedure, it is normal to have bruising and tenderness at the puncture site in your groin, neck, or forearm.  Check your puncture site every day for signs of infection.  Get help right away if your puncture site is bleeding and the bleeding does not stop after applying firm pressure to the area. This is a medical emergency. This information is not intended to replace advice given to you by your health care provider. Make sure you discuss any questions you have with your health care provider.

## 2020-03-01 NOTE — Progress Notes (Signed)
Pt continues to cough/ advised to hold groin site, states his throat is sore, anesthesia charge was called and will call Dr Lissa Hoard for further advice.

## 2020-03-01 NOTE — Transfer of Care (Signed)
Immediate Anesthesia Transfer of Care Note  [name]  Procedure(s) Performed: ATRIAL FIBRILLATION ABLATION (N/A )  Patient Location: Cath Lab  Anesthesia Type:General  Level of Consciousness: awake, alert  and oriented  Airway & Oxygen Therapy: Patient Spontanous Breathing and Patient connected to face mask oxygen  Post-op Assessment: Report given to RN, Post -op Vital signs reviewed and stable and Patient moving all extremities X 4  Post vital signs: Reviewed and stable  Last Vitals:  Vitals Value Taken Time  BP 115/84 03/01/20 1001  Temp    Pulse 85 03/01/20 1002  Resp 20 03/01/20 1002  SpO2 97 % 03/01/20 1002  Vitals shown include unvalidated device data.  Last Pain:  Vitals:   03/01/20 0612  TempSrc:   PainSc: 0-No pain         Complications: No apparent anesthesia complications

## 2020-03-02 LAB — POCT ACTIVATED CLOTTING TIME: Activated Clotting Time: 241 seconds

## 2020-03-02 NOTE — Anesthesia Postprocedure Evaluation (Signed)
Anesthesia Post Note  [name]  Procedure(s) Performed: ATRIAL FIBRILLATION ABLATION (N/A )     Patient location during evaluation: PACU Anesthesia Type: General Level of consciousness: sedated and patient cooperative Pain management: pain level controlled Vital Signs Assessment: post-procedure vital signs reviewed and stable Respiratory status: spontaneous breathing Cardiovascular status: stable Anesthetic complications: no    Last Vitals:  Vitals:   03/01/20 1347 03/01/20 1417  BP: 122/82 (!) 133/96  Pulse: 96 (!) 102  Resp: 19 13  Temp:    SpO2: 95% 94%    Last Pain:  Vitals:   03/01/20 1030  TempSrc: Temporal  PainSc: 0-No pain                 Nolon Nations

## 2020-03-07 ENCOUNTER — Other Ambulatory Visit: Payer: Self-pay

## 2020-03-07 MED ORDER — ENTRESTO 49-51 MG PO TABS: 1.0000 | ORAL_TABLET | Freq: Two times a day (BID) | ORAL | 3 refills | Status: DC

## 2020-03-07 MED ORDER — EZETIMIBE 10 MG PO TABS: 10.0000 mg | ORAL_TABLET | Freq: Every day | ORAL | 0 refills | Status: DC

## 2020-03-12 ENCOUNTER — Other Ambulatory Visit: Payer: Self-pay

## 2020-03-12 ENCOUNTER — Ambulatory Visit (HOSPITAL_COMMUNITY)
Admission: RE | Admit: 2020-03-12 | Discharge: 2020-03-12 | Disposition: A | Discharge status: Home / Self Care | Payer: Medicare Other | Source: Ambulatory Visit | Attending: Physician Assistant | Admitting: Physician Assistant

## 2020-03-12 ENCOUNTER — Encounter (HOSPITAL_COMMUNITY): Payer: Self-pay | Admitting: Physician Assistant

## 2020-03-12 VITALS — BP 130/88 | HR 102 | Ht 70.0 in | Wt 255.4 lb

## 2020-03-12 DIAGNOSIS — K219 Gastro-esophageal reflux disease without esophagitis: Secondary | ICD-10-CM | POA: Insufficient documentation

## 2020-03-12 DIAGNOSIS — M1990 Unspecified osteoarthritis, unspecified site: Secondary | ICD-10-CM | POA: Insufficient documentation

## 2020-03-12 DIAGNOSIS — I4819 Other persistent atrial fibrillation: Secondary | ICD-10-CM | POA: Insufficient documentation

## 2020-03-12 DIAGNOSIS — Z79899 Other long term (current) drug therapy: Secondary | ICD-10-CM | POA: Diagnosis not present

## 2020-03-12 DIAGNOSIS — Z803 Family history of malignant neoplasm of breast: Secondary | ICD-10-CM | POA: Insufficient documentation

## 2020-03-12 DIAGNOSIS — Z833 Family history of diabetes mellitus: Secondary | ICD-10-CM | POA: Diagnosis not present

## 2020-03-12 DIAGNOSIS — Z885 Allergy status to narcotic agent status: Secondary | ICD-10-CM | POA: Insufficient documentation

## 2020-03-12 DIAGNOSIS — I252 Old myocardial infarction: Secondary | ICD-10-CM | POA: Insufficient documentation

## 2020-03-12 DIAGNOSIS — E669 Obesity, unspecified: Secondary | ICD-10-CM | POA: Diagnosis not present

## 2020-03-12 DIAGNOSIS — Z7901 Long term (current) use of anticoagulants: Secondary | ICD-10-CM | POA: Insufficient documentation

## 2020-03-12 DIAGNOSIS — Z8249 Family history of ischemic heart disease and other diseases of the circulatory system: Secondary | ICD-10-CM | POA: Insufficient documentation

## 2020-03-12 DIAGNOSIS — F17210 Nicotine dependence, cigarettes, uncomplicated: Secondary | ICD-10-CM | POA: Insufficient documentation

## 2020-03-12 DIAGNOSIS — I10 Essential (primary) hypertension: Secondary | ICD-10-CM | POA: Diagnosis not present

## 2020-03-12 DIAGNOSIS — Z888 Allergy status to other drugs, medicaments and biological substances status: Secondary | ICD-10-CM | POA: Diagnosis not present

## 2020-03-12 DIAGNOSIS — Z808 Family history of malignant neoplasm of other organs or systems: Secondary | ICD-10-CM | POA: Insufficient documentation

## 2020-03-12 DIAGNOSIS — Z801 Family history of malignant neoplasm of trachea, bronchus and lung: Secondary | ICD-10-CM | POA: Diagnosis not present

## 2020-03-12 DIAGNOSIS — I2510 Atherosclerotic heart disease of native coronary artery without angina pectoris: Secondary | ICD-10-CM | POA: Diagnosis not present

## 2020-03-12 DIAGNOSIS — G4733 Obstructive sleep apnea (adult) (pediatric): Secondary | ICD-10-CM | POA: Diagnosis not present

## 2020-03-12 DIAGNOSIS — I4891 Unspecified atrial fibrillation: Secondary | ICD-10-CM | POA: Diagnosis present

## 2020-03-12 DIAGNOSIS — D6869 Other thrombophilia: Secondary | ICD-10-CM | POA: Diagnosis not present

## 2020-03-12 DIAGNOSIS — Z6836 Body mass index (BMI) 36.0-36.9, adult: Secondary | ICD-10-CM | POA: Insufficient documentation

## 2020-03-12 DIAGNOSIS — I428 Other cardiomyopathies: Secondary | ICD-10-CM | POA: Insufficient documentation

## 2020-03-12 DIAGNOSIS — Z881 Allergy status to other antibiotic agents status: Secondary | ICD-10-CM | POA: Insufficient documentation

## 2020-03-12 DIAGNOSIS — Z955 Presence of coronary angioplasty implant and graft: Secondary | ICD-10-CM | POA: Diagnosis not present

## 2020-03-12 DIAGNOSIS — E785 Hyperlipidemia, unspecified: Secondary | ICD-10-CM | POA: Diagnosis not present

## 2020-03-12 LAB — BASIC METABOLIC PANEL
Anion gap: 8 (ref 5–15)
BUN: 17 mg/dL (ref 6–20)
CO2: 25 mmol/L (ref 22–32)
Calcium: 8.9 mg/dL (ref 8.9–10.3)
Chloride: 108 mmol/L (ref 98–111)
Creatinine, Ser: 0.95 mg/dL (ref 0.61–1.24)
GFR calc Af Amer: >60 mL/min (ref >60)
GFR calc non Af Amer: >60 mL/min (ref >60)
Glucose, Bld: 232 mg/dL — ABNORMAL HIGH (ref 70–99)
Potassium: 4 mmol/L (ref 3.5–5.1)
Sodium: 141 mmol/L (ref 135–145)

## 2020-03-12 LAB — CBC
HCT: 49.7 % (ref 39.0–52.0)
Hemoglobin: 16.6 g/dL (ref 13.0–17.0)
MCH: 28.5 pg (ref 26.0–34.0)
MCHC: 33.4 g/dL (ref 30.0–36.0)
MCV: 85.4 fL (ref 80.0–100.0)
Platelets: 254 10*3/uL (ref 150–400)
RBC: 5.82 MIL/uL — ABNORMAL HIGH (ref 4.22–5.81)
RDW: 13.3 % (ref 11.5–15.5)
WBC: 11.1 10*3/uL — ABNORMAL HIGH (ref 4.0–10.5)
nRBC: 0 % (ref 0.0–0.2)

## 2020-03-12 MED ORDER — DILTIAZEM HCL 30 MG PO TABS
ORAL_TABLET | ORAL | 1 refills | Status: DC
Start: 2020-03-12 — End: 2021-05-16

## 2020-03-12 NOTE — H&P (View-Only) (Signed)
Primary Care Physician: [name], NP Primary Cardiologist: Dr Einar Gip Primary Electrophysiologist: Dr Rayann Heman Referring Physician: Dr Rosanne Ashing is a 54 y.o. male with a history of CAD, OSA, tobacco abuse, HTN, HLD, NICM, and persistent atrial fibrillation who presents for follow up in the [facility]. Patient has undergone two afib ablations with Dr Rayann Heman in 2018 and on 03/01/20. He has previously failed dofetilide and sotalol 2/2 QT prolongation. He has also been on Multaq which was stopped after his first ablation. Patient is on Eliquis for a CHADS2VASC score of 3. Unfortunately, patient felt he was back in afib the evening of 03/09/20 with symptoms of fatigue and palpitations. He has been persistently in afib all through the weekend. There were no specific triggers that the patient could identify. He denies CP, swallowing, or groin issues.   Today, he denies symptoms of palpitations, chest pain, shortness of breath, orthopnea, PND, lower extremity edema, dizziness, presyncope, syncope, snoring, daytime somnolence, bleeding, or neurologic sequela. The patient is tolerating medications without difficulties and is otherwise without complaint today.    Atrial Fibrillation Risk Factors:  he does have symptoms or diagnosis of sleep apnea. he is compliant with CPAP therapy. he does not have a history of rheumatic fever. He does have a family history of afib. Mother and father.  he has a BMI of Body mass index is 36.65 kg/m.Marland Kitchen Filed Weights   03/12/20 1431  Weight: 115.8 kg    Family History  Problem Relation Age of Onset  . Heart disease Mother 91       CABG age 56  . Breast cancer Mother   . Tongue cancer Mother   . Diabetes Mother   . Heart attack Father 19       Father had around 7 heart attacks per pt  . Diabetes Father   . Lung cancer Father   . Congestive Heart Failure Father   . Lung cancer Paternal Uncle      Atrial  Fibrillation Management history:  Previous antiarrhythmic drugs: Multaq, dofetilide, sotalol Previous cardioversions: 2017 x2 Previous ablations: 2018, 03/01/20 CHADS2VASC score: 3 Anticoagulation history: Eliquis   Past Medical History:  Diagnosis Date  . Arthritis    "a little in my right knee" (07/14/2016)  . CAD (coronary artery disease)   . Chronic congestive heart failure with left ventricular diastolic dysfunction ([place])   . Complication of anesthesia    Pt reports "they have a hard time waking me up"  . GERD (gastroesophageal reflux disease)    hx  . Headache    "with every heart issue that I have" (07/14/2016)  . High cholesterol   . History of hiatal hernia 1990s   "fixed it w/scope down my throat"  . Hypertension   . Myocardial infarction [facility]) 05/2013   stents: left PDA, 1st OM at [facility]  . Obesity   . Persistent atrial fibrillation ([place])   . Pneumonia ~ 1992  . QT prolongation 07/15/2016  . Seasonal allergies    "I take Allegra prn" (07/14/2016)  . Tobacco abuse 12/16/2016   Past Surgical History:  Procedure Laterality Date  . APPENDECTOMY  1984  . ATRIAL FIBRILLATION ABLATION N/A 03/01/2020   Procedure: ATRIAL FIBRILLATION ABLATION;  Surgeon: [name], MD;  Location: [place] CV LAB;  Service: Cardiovascular;  Laterality: N/A;  . CARDIOVERSION N/A 05/27/2016   Procedure: CARDIOVERSION;  Surgeon: [name], MD;  Location: [place];  Service:  Cardiovascular;  Laterality: N/A;  . CARDIOVERSION N/A 06/11/2016   Procedure: CARDIOVERSION;  Surgeon: [name], MD;  Location: [place];  Service: Cardiovascular;  Laterality: N/A;  . CORONARY ANGIOPLASTY WITH STENT PLACEMENT  05/30/2013   "2 stents put in at [facility]" & /stent card  . CORONARY STENT INTERVENTION N/A 11/27/2017   Procedure: CORONARY STENT INTERVENTION;  Surgeon: [name], MD;  Location: [place] CV LAB;  Service: Cardiovascular;  Laterality: N/A;  . CORONARY STENT  INTERVENTION N/A 03/21/2019   Procedure: CORONARY STENT INTERVENTION;  Surgeon: [name], MD;  Location: [place] CV LAB;  Service: Cardiovascular;  Laterality: N/A;  . ELECTROPHYSIOLOGIC STUDY N/A 11/13/2016   Procedure: Atrial Fibrillation Ablation;  Surgeon: [name], MD;  Location: [place] CV LAB;  Service: Cardiovascular;  Laterality: N/A;  . ESOPHAGOGASTRODUODENOSCOPY (EGD) WITH ESOPHAGEAL DILATION  1990s X 2  . INTRAVASCULAR PRESSURE WIRE/FFR STUDY N/A 09/21/2018   Procedure: INTRAVASCULAR PRESSURE WIRE/FFR STUDY;  Surgeon: [name], MD;  Location: [place] [facility] LAB;  Service: Cardiovascular;  Laterality: N/A;  . INTRAVASCULAR PRESSURE WIRE/FFR STUDY N/A 03/21/2019   Procedure: INTRAVASCULAR PRESSURE WIRE/FFR STUDY;  Surgeon: [name], MD;  Location: [place] [facility] LAB;  Service: Cardiovascular;  Laterality: N/A;  . INTRAVASCULAR PRESSURE WIRE/FFR STUDY N/A 01/24/2020   Procedure: INTRAVASCULAR PRESSURE WIRE/FFR STUDY;  Surgeon: [name], MD;  Location: [place] [facility];  Service: Cardiovascular;  Laterality: N/A;  . KNEE ARTHROSCOPY Right 1986; ~ 1995 X 2  . LAPAROSCOPIC CHOLECYSTECTOMY  2015  . LEFT HEART CATH AND CORONARY ANGIOGRAPHY N/A 11/27/2017   Procedure: LEFT HEART CATH AND CORONARY ANGIOGRAPHY;  Surgeon: [name], MD;  Location: [place] [facility];  Service: Cardiovascular;  Laterality: N/A;  . LEFT HEART CATH AND CORONARY ANGIOGRAPHY N/A 09/21/2018   Procedure: LEFT HEART CATH AND CORONARY ANGIOGRAPHY;  Surgeon: [name], MD;  Location: [place] [facility];  Service: Cardiovascular;  Laterality: N/A;  . LEFT HEART CATH AND CORONARY ANGIOGRAPHY N/A 03/21/2019   Procedure: LEFT HEART CATH AND CORONARY ANGIOGRAPHY;  Surgeon: [name], MD;  Location: [place] [facility];  Service: Cardiovascular;  Laterality: N/A;  . REPAIR OF ESOPHAGUS  1998   perforation of the distal esophagus from bad heartburn  . RIGHT/LEFT HEART CATH AND CORONARY ANGIOGRAPHY N/A 01/24/2020    Procedure: RIGHT/LEFT HEART CATH AND CORONARY ANGIOGRAPHY;  Surgeon: [name], MD;  Location: [place] [facility];  Service: Cardiovascular;  Laterality: N/A;  . TEE WITHOUT CARDIOVERSION N/A 11/13/2016   Procedure: TRANSESOPHAGEAL ECHOCARDIOGRAM (TEE);  Surgeon: Thayer Headings, MD;  Location: [facility] ENDOSCOPY;  Service: Cardiovascular;  Laterality: N/A;    Current Outpatient Medications  Medication Sig Dispense Refill  . acetaminophen (TYLENOL) 500 MG tablet Take 500 mg by mouth 2 (two) times daily as needed for moderate pain.    Marland Kitchen amLODipine (NORVASC) 10 MG tablet Take 1 tablet (10 mg total) by mouth daily. 90 tablet 1  . apixaban (ELIQUIS) 5 MG TABS tablet Take 1 tablet (5 mg total) by mouth 2 (two) times daily. 180 tablet 3  . bisacodyl (DULCOLAX) 5 MG EC tablet Take 5 mg by mouth daily as needed.     . docusate sodium (PX DOCUSATE SODIUM) 100 MG capsule Take 100 mg by mouth 2 (two) times daily as needed.     . ezetimibe (ZETIA) 10 MG tablet Take 1 tablet (10 mg total) by mouth daily. 90 tablet 0  . fexofenadine (ALLEGRA) 180 MG tablet Take 180  mg by mouth daily as needed (seasonal allergies).     . hydrALAZINE (APRESOLINE) 50 MG tablet Take 1 tablet (50 mg total) by mouth 3 (three) times daily. 90 tablet 2  . metoprolol tartrate (LOPRESSOR) 100 MG tablet Take 1 tablet (100 mg total) by mouth 2 (two) times daily. 60 tablet 2  . nitroGLYCERIN (NITROSTAT) 0.4 MG SL tablet Place 1 tablet (0.4 mg total) under the tongue every 5 (five) minutes x 3 doses as needed for chest pain. 25 tablet 2  . pantoprazole (PROTONIX) 40 MG tablet Take 1 tablet (40 mg total) by mouth daily. 45 tablet 0  . sacubitril-valsartan (ENTRESTO) 49-51 MG Take 1 tablet by mouth 2 (two) times daily. 60 tablet 3  . shark liver oil-cocoa butter (PREPARATION H) 0.25-3-85.5 % suppository Place 1 suppository rectally 2 (two) times daily as needed for hemorrhoids.    . torsemide (DEMADEX) 20 MG tablet Take 20 mg by mouth as needed.      . diltiazem (CARDIZEM) 30 MG tablet Take 1 tablet every 4 hours AS NEEDED for heart rate >110 45 tablet 1   No current facility-administered medications for this encounter.    Allergies  Allergen Reactions  . Crestor [Rosuvastatin] Other (See Comments)    Severe leg cramps  . Dilaudid [Hydromorphone] Itching and Swelling  . Isosorbide     Muscle pain and cramps  . Lexapro [Escitalopram]     Have bad thoughts made anxiety worse   . Lipitor [Atorvastatin Calcium] Other (See Comments)    myalgias  . Erythromycin Other (See Comments)    Hallucinations   . Oxycodone Itching and Rash    Swelling to face, hands, mouth Completley intolerant to any meds with oxycodone in it     Social History   Socioeconomic History  . Marital status: Married    Spouse name: Not on file  . Number of children: 2  . Years of education: Not on file  . Highest education level: Not on file  Occupational History  . Not on file  Tobacco Use  . Smoking status: Current Every Day Smoker    Years: 25.00    Types: Cigarettes, E-cigarettes    Last attempt to quit: 01/15/2020    Years since quitting: 0.1  . Smokeless tobacco: Never Used  . Tobacco comment: 5 cigs every 3 days  Substance and Sexual Activity  . Alcohol use: No  . Drug use: No  . Sexual activity: Yes  Other Topics Concern  . Not on file  Social History Narrative   Pt lives in [place] with spouse and 81 year old son.   disabled   Social Determinants of Health   Financial Resource Strain:   . Difficulty of Paying Living Expenses:   Food Insecurity:   . Worried About Charity fundraiser in the Last Year:   . Arboriculturist in the Last Year:   Transportation Needs:   . Film/video editor (Medical):   Marland Kitchen Lack of Transportation (Non-Medical):   Physical Activity:   . Days of Exercise per Week:   . Minutes of Exercise per Session:   Stress:   . Feeling of Stress :   Social Connections:   . Frequency of Communication with  Friends and Family:   . Frequency of Social Gatherings with Friends and Family:   . Attends Religious Services:   . Active Member of Clubs or Organizations:   . Attends Archivist Meetings:   Marland Kitchen Marital  Status:   Intimate Partner Violence:   . Fear of Current or Ex-Partner:   . Emotionally Abused:   Marland Kitchen Physically Abused:   . Sexually Abused:      ROS- All systems are reviewed and negative except as per the HPI above.  Physical Exam: Vitals:   03/12/20 1431  BP: 130/88  Pulse: (!) 102  Weight: 115.8 kg  Height: 5\' 10"  (1.778 m)    GEN- The patient is well appearing obese male, alert and oriented x 3 today.   Head- normocephalic, atraumatic Eyes-  Sclera clear, conjunctiva pink Ears- hearing intact Oropharynx- clear Neck- supple  Lungs- Clear to ausculation bilaterally, normal work of breathing Heart- irregular rate and rhythm, no murmurs, rubs or gallops  GI- soft, NT, ND, + BS Extremities- no clubbing, cyanosis, or edema MS- no significant deformity or atrophy Skin- no rash or lesion Psych- euthymic mood, full affect Neuro- strength and sensation are intact  Wt Readings from Last 3 Encounters:  03/12/20 115.8 kg  03/01/20 112 kg  02/06/20 114.8 kg    EKG today demonstrates afib HR 102, QRS 98, QTc 484  Echo 12/28/19 demonstrated  Severely depressed LV systolic function with visual EF 25-30%. Severe  global hypokinesis Left ventricle cavity is normal in size. Moderate left  ventricular hypertrophy. Doppler evidence of grade II diastolic  dysfunction, elevated LAP. Calculated EF 29%.  No significant valvular abnormalities.  Left atrial cavity is mildly dilated.  Right ventricle cavity is normal in size but visually low normal right  ventricular function.  The aortic root is dilated, 4.0cm at the level of sinus of Valsalva.  Proximal ascending aorta not well visualized.  Prior study dated 11/27/2017: Normal LVEF. Mild LVH. Normal diastolic  parameters.  Mild LA enlargement.  Epic records are reviewed at length today  CHA2DS2-VASc Score = 3  The patient's score is based upon: CHF History: 1 HTN History: 1 Age : 0 Diabetes History: 0 Stroke History: 0 Vascular Disease History: 1 Gender: 0      ASSESSMENT AND PLAN: 1. Persistent Atrial Fibrillation (ICD10:  I48.19) The patient's CHA2DS2-VASc score is 3, indicating a 3.2% annual risk of stroke.   S/p afib ablation 03/01/20 with Dr Rayann Heman Patient is back in afib today. Patient reassured breakthrough afib is normal within 3 months post ablation.  Will arrange for DCCV. Check bmet/CBC Continue Eliquis 5 mg BID, patient denies any missed doses in the last 3 weeks.  Continue Lopressor 100 mg BID Will start diltiazem 30 mg PRN q 4hours for heart rate >110 bpm.  We discussed that AAD may be needed if his afib remains persistent. Could consider Multaq. Would avoid class 1C with h/o CAD and he has previously failed sotalol and dofetilide 2/2 QT prolongation.   2. Secondary Hypercoagulable State (ICD10:  D68.69) The patient is at significant risk for stroke/thromboembolism based upon his CHA2DS2-VASc Score of 3.  Continue Apixaban (Eliquis).   3. Obesity Body mass index is 36.65 kg/m. Lifestyle modification was discussed at length including regular exercise and weight reduction.  4. Obstructive sleep apnea The importance of adequate treatment of sleep apnea was discussed today in order to improve our ability to maintain sinus rhythm long term. Patient reports compliance with CPAP therapy.  5. HTN Stable, no changes today.  6. CAD No anginal symptoms.  Followed by Dr Einar Gip  7. NICM Echo shows EF 25-30%. Suspected 2/2 afib. Hopefully this will improve with maintenance of SR.   Follow up in the AF  clinic one week post DCCV.   [facility] [address] [place], [place] [address] [phone] 03/12/2020 3:18 PM

## 2020-03-12 NOTE — Patient Instructions (Addendum)
Cardizem 30mg  -- take 1 tablet every 4 hours AS NEEDED for heart rate >110 as long as top number of blood pressure >100.   Cardioversion scheduled for Wednesday,  - Arrive at the [facility] and go to admitting at  -Do not eat or drink anything after midnight the night prior to your procedure.  - Take all your medication with a sip of water prior to arrival.  - You will not be able to drive home after your procedure.

## 2020-03-12 NOTE — Progress Notes (Signed)
Primary Care Physician: [name], NP Primary Cardiologist: Dr Einar Gip Primary Electrophysiologist: Dr Rayann Heman Referring Physician: Dr Rosanne Ashing is a 55 y.o. male with a history of CAD, OSA, tobacco abuse, HTN, HLD, NICM, and persistent atrial fibrillation who presents for follow up in the [facility]. Patient has undergone two afib ablations with Dr Rayann Heman in 2018 and on 03/01/20. He has previously failed dofetilide and sotalol 2/2 QT prolongation. He has also been on Multaq which was stopped after his first ablation. Patient is on Eliquis for a CHADS2VASC score of 3. Unfortunately, patient felt he was back in afib the evening of 03/09/20 with symptoms of fatigue and palpitations. He has been persistently in afib all through the weekend. There were no specific triggers that the patient could identify. He denies CP, swallowing, or groin issues.   Today, he denies symptoms of palpitations, chest pain, shortness of breath, orthopnea, PND, lower extremity edema, dizziness, presyncope, syncope, snoring, daytime somnolence, bleeding, or neurologic sequela. The patient is tolerating medications without difficulties and is otherwise without complaint today.    Atrial Fibrillation Risk Factors:  he does have symptoms or diagnosis of sleep apnea. he is compliant with CPAP therapy. he does not have a history of rheumatic fever. He does have a family history of afib. Mother and father.  he has a BMI of Body mass index is 36.65 kg/m.Marland Kitchen Filed Weights   03/12/20 1431  Weight: 115.8 kg    Family History  Problem Relation Age of Onset   Heart disease Mother 65       CABG age 36   Breast cancer Mother    Tongue cancer Mother    Diabetes Mother    Heart attack Father 18       Father had around 7 heart attacks per pt   Diabetes Father    Lung cancer Father    Congestive Heart Failure Father    Lung cancer Paternal Uncle      Atrial  Fibrillation Management history:  Previous antiarrhythmic drugs: Multaq, dofetilide, sotalol Previous cardioversions: 2017 x2 Previous ablations: 2018, 03/01/20 CHADS2VASC score: 3 Anticoagulation history: Eliquis   Past Medical History:  Diagnosis Date   Arthritis    "a little in my right knee" (07/14/2016)   CAD (coronary artery disease)    Chronic congestive heart failure with left ventricular diastolic dysfunction (HCC)    Complication of anesthesia    Pt reports "they have a hard time waking me up"   GERD (gastroesophageal reflux disease)    hx   Headache    "with every heart issue that I have" (07/14/2016)   High cholesterol    History of hiatal hernia 1990s   "fixed it w/scope down my throat"   Hypertension    Myocardial infarction ([place]) 05/2013   stents: left PDA, 1st OM at [facility]   Obesity    Persistent atrial fibrillation ([place])    Pneumonia ~ 1992   QT prolongation 07/15/2016   Seasonal allergies    "I take Allegra prn" (07/14/2016)   Tobacco abuse 12/16/2016   Past Surgical History:  Procedure Laterality Date   APPENDECTOMY  1984   ATRIAL FIBRILLATION ABLATION N/A 03/01/2020   Procedure: ATRIAL FIBRILLATION ABLATION;  Surgeon: [name], MD;  Location: [place] [facility];  Service: Cardiovascular;  Laterality: N/A;   CARDIOVERSION N/A 05/27/2016   Procedure: CARDIOVERSION;  Surgeon: [name], MD;  Location: [place];  Service:  Cardiovascular;  Laterality: N/A;   CARDIOVERSION N/A 06/11/2016   Procedure: CARDIOVERSION;  Surgeon: [name], MD;  Location: [place];  Service: Cardiovascular;  Laterality: N/A;   CORONARY ANGIOPLASTY WITH STENT PLACEMENT  05/30/2013   "2 stents put in at [facility]" & /stent card   CORONARY STENT INTERVENTION N/A 11/27/2017   Procedure: CORONARY STENT INTERVENTION;  Surgeon: [name], MD;  Location: [place] [facility];  Service: Cardiovascular;  Laterality: N/A;   CORONARY STENT  INTERVENTION N/A 03/21/2019   Procedure: CORONARY STENT INTERVENTION;  Surgeon: [name], MD;  Location: [place] [facility];  Service: Cardiovascular;  Laterality: N/A;   ELECTROPHYSIOLOGIC STUDY N/A 11/13/2016   Procedure: Atrial Fibrillation Ablation;  Surgeon: [name], MD;  Location: [place] [facility];  Service: Cardiovascular;  Laterality: N/A;   ESOPHAGOGASTRODUODENOSCOPY (EGD) WITH ESOPHAGEAL DILATION  1990s X 2   INTRAVASCULAR PRESSURE WIRE/FFR STUDY N/A 09/21/2018   Procedure: INTRAVASCULAR PRESSURE WIRE/FFR STUDY;  Surgeon: [name], MD;  Location: [facility] [facility];  Service: Cardiovascular;  Laterality: N/A;   INTRAVASCULAR PRESSURE WIRE/FFR STUDY N/A 03/21/2019   Procedure: INTRAVASCULAR PRESSURE WIRE/FFR STUDY;  Surgeon: [name], MD;  Location: [place] [facility];  Service: Cardiovascular;  Laterality: N/A;   INTRAVASCULAR PRESSURE WIRE/FFR STUDY N/A 01/24/2020   Procedure: INTRAVASCULAR PRESSURE WIRE/FFR STUDY;  Surgeon: [name], MD;  Location: [place] [facility];  Service: Cardiovascular;  Laterality: N/A;   KNEE ARTHROSCOPY Right 1986; ~ 1995 X [place]  2015   LEFT HEART CATH AND CORONARY ANGIOGRAPHY N/A 11/27/2017   Procedure: LEFT HEART CATH AND CORONARY ANGIOGRAPHY;  Surgeon: [name], MD;  Location: [place] [facility];  Service: Cardiovascular;  Laterality: N/A;   LEFT HEART CATH AND CORONARY ANGIOGRAPHY N/A 09/21/2018   Procedure: LEFT HEART CATH AND CORONARY ANGIOGRAPHY;  Surgeon: [name], MD;  Location: [facility];  Service: Cardiovascular;  Laterality: N/A;   LEFT HEART CATH AND CORONARY ANGIOGRAPHY N/A 03/21/2019   Procedure: LEFT HEART CATH AND CORONARY ANGIOGRAPHY;  Surgeon: [name], MD;  Location: [place] [facility];  Service: Cardiovascular;  Laterality: N/A;   REPAIR OF ESOPHAGUS  1998   perforation of the distal esophagus from bad heartburn   RIGHT/LEFT HEART CATH AND CORONARY ANGIOGRAPHY N/A 01/24/2020    Procedure: RIGHT/LEFT HEART CATH AND CORONARY ANGIOGRAPHY;  Surgeon: [name], MD;  Location: [place] [facility];  Service: Cardiovascular;  Laterality: N/A;   TEE WITHOUT CARDIOVERSION N/A 11/13/2016   Procedure: TRANSESOPHAGEAL ECHOCARDIOGRAM (TEE);  Surgeon: Thayer Headings, MD;  Location: [facility];  Service: Cardiovascular;  Laterality: N/A;    Current Outpatient Medications  Medication Sig Dispense Refill   acetaminophen (TYLENOL) 500 MG tablet Take 500 mg by mouth 2 (two) times daily as needed for moderate pain.     amLODipine (NORVASC) 10 MG tablet Take 1 tablet (10 mg total) by mouth daily. 90 tablet 1   apixaban (ELIQUIS) 5 MG TABS tablet Take 1 tablet (5 mg total) by mouth 2 (two) times daily. 180 tablet 3   bisacodyl (DULCOLAX) 5 MG EC tablet Take 5 mg by mouth daily as needed.      docusate sodium (PX DOCUSATE SODIUM) 100 MG capsule Take 100 mg by mouth 2 (two) times daily as needed.      ezetimibe (ZETIA) 10 MG tablet Take 1 tablet (10 mg total) by mouth daily. 90 tablet 0   fexofenadine (ALLEGRA) 180 MG tablet Take 180  mg by mouth daily as needed (seasonal allergies).      hydrALAZINE (APRESOLINE) 50 MG tablet Take 1 tablet (50 mg total) by mouth 3 (three) times daily. 90 tablet 2   metoprolol tartrate (LOPRESSOR) 100 MG tablet Take 1 tablet (100 mg total) by mouth 2 (two) times daily. 60 tablet 2   nitroGLYCERIN (NITROSTAT) 0.4 MG SL tablet Place 1 tablet (0.4 mg total) under the tongue every 5 (five) minutes x 3 doses as needed for chest pain. 25 tablet 2   pantoprazole (PROTONIX) 40 MG tablet Take 1 tablet (40 mg total) by mouth daily. 45 tablet 0   sacubitril-valsartan (ENTRESTO) 49-51 MG Take 1 tablet by mouth 2 (two) times daily. 60 tablet 3   shark liver oil-cocoa butter (PREPARATION H) 0.25-3-85.5 % suppository Place 1 suppository rectally 2 (two) times daily as needed for hemorrhoids.     torsemide (DEMADEX) 20 MG tablet Take 20 mg by mouth as needed.       diltiazem (CARDIZEM) 30 MG tablet Take 1 tablet every 4 hours AS NEEDED for heart rate >110 45 tablet 1   No current facility-administered medications for this encounter.    Allergies  Allergen Reactions   Crestor [Rosuvastatin] Other (See Comments)    Severe leg cramps   Dilaudid [Hydromorphone] Itching and Swelling   Isosorbide     Muscle pain and cramps   Lexapro [Escitalopram]     Have bad thoughts made anxiety worse    Lipitor [Atorvastatin Calcium] Other (See Comments)    myalgias   Erythromycin Other (See Comments)    Hallucinations    Oxycodone Itching and Rash    Swelling to face, hands, mouth Completley intolerant to any meds with oxycodone in it     Social History   Socioeconomic History   Marital status: Married    Spouse name: Not on file   Number of children: 2   Years of education: Not on file   Highest education level: Not on file  Occupational History   Not on file  Tobacco Use   Smoking status: Current Every Day Smoker    Years: 25.00    Types: Cigarettes, E-cigarettes    Last attempt to quit: 01/15/2020    Years since quitting: 0.1   Smokeless tobacco: Never Used   Tobacco comment: 5 cigs every 3 days  Substance and Sexual Activity   Alcohol use: No   Drug use: No   Sexual activity: Yes  Other Topics Concern   Not on file  Social History Narrative   Pt lives in [place] with spouse and 72 year old son.   disabled   Social Determinants of Radio broadcast assistant Strain:    Difficulty of Paying Living Expenses:   Food Insecurity:    Worried About Charity fundraiser in the Last Year:    Arboriculturist in the Last Year:   Transportation Needs:    Film/video editor (Medical):    Lack of Transportation (Non-Medical):   Physical Activity:    Days of Exercise per Week:    Minutes of Exercise per Session:   Stress:    Feeling of Stress :   Social Connections:    Frequency of Communication with  Friends and Family:    Frequency of Social Gatherings with Friends and Family:    Attends Religious Services:    Active Member of Clubs or Organizations:    Attends Archivist Meetings:    Marital  Status:   Intimate Partner Violence:    Fear of Current or Ex-Partner:    Emotionally Abused:    Physically Abused:    Sexually Abused:      ROS- All systems are reviewed and negative except as per the HPI above.  Physical Exam: Vitals:   03/12/20 1431  BP: 130/88  Pulse: (!) 102  Weight: 115.8 kg  Height: 5\' 10"  (1.778 m)    GEN- The patient is well appearing obese male, alert and oriented x 3 today.   Head- normocephalic, atraumatic Eyes-  Sclera clear, conjunctiva pink Ears- hearing intact Oropharynx- clear Neck- supple  Lungs- Clear to ausculation bilaterally, normal work of breathing Heart- irregular rate and rhythm, no murmurs, rubs or gallops  GI- soft, NT, ND, + BS Extremities- no clubbing, cyanosis, or edema MS- no significant deformity or atrophy Skin- no rash or lesion Psych- euthymic mood, full affect Neuro- strength and sensation are intact  Wt Readings from Last 3 Encounters:  03/12/20 115.8 kg  03/01/20 112 kg  02/06/20 114.8 kg    EKG today demonstrates afib HR 102, QRS 98, QTc 484  Echo 12/28/19 demonstrated  Severely depressed LV systolic function with visual EF 25-30%. Severe  global hypokinesis Left ventricle cavity is normal in size. Moderate left  ventricular hypertrophy. Doppler evidence of grade II diastolic  dysfunction, elevated LAP. Calculated EF 29%.  No significant valvular abnormalities.  Left atrial cavity is mildly dilated.  Right ventricle cavity is normal in size but visually low normal right  ventricular function.  The aortic root is dilated, 4.0cm at the level of sinus of Valsalva.  Proximal ascending aorta not well visualized.  Prior study dated 11/27/2017: Normal LVEF. Mild LVH. Normal diastolic  parameters.  Mild LA enlargement.  Epic records are reviewed at length today  CHA2DS2-VASc Score = 3  The patient's score is based upon: CHF History: 1 HTN History: 1 Age : 0 Diabetes History: 0 Stroke History: 0 Vascular Disease History: 1 Gender: 0      ASSESSMENT AND PLAN: 1. Persistent Atrial Fibrillation (ICD10:  I48.19) The patient's CHA2DS2-VASc score is 3, indicating a 3.2% annual risk of stroke.   S/p afib ablation 03/01/20 with Dr Rayann Heman Patient is back in afib today. Patient reassured breakthrough afib is normal within 3 months post ablation.  Will arrange for DCCV. Check bmet/CBC Continue Eliquis 5 mg BID, patient denies any missed doses in the last 3 weeks.  Continue Lopressor 100 mg BID Will start diltiazem 30 mg PRN q 4hours for heart rate >110 bpm.  We discussed that AAD may be needed if his afib remains persistent. Could consider Multaq. Would avoid class 1C with h/o CAD and he has previously failed sotalol and dofetilide 2/2 QT prolongation.   2. Secondary Hypercoagulable State (ICD10:  D68.69) The patient is at significant risk for stroke/thromboembolism based upon his CHA2DS2-VASc Score of 3.  Continue Apixaban (Eliquis).   3. Obesity Body mass index is 36.65 kg/m. Lifestyle modification was discussed at length including regular exercise and weight reduction.  4. Obstructive sleep apnea The importance of adequate treatment of sleep apnea was discussed today in order to improve our ability to maintain sinus rhythm long term. Patient reports compliance with CPAP therapy.  5. HTN Stable, no changes today.  6. CAD No anginal symptoms.  Followed by Dr Einar Gip  7. NICM Echo shows EF 25-30%. Suspected 2/2 afib. Hopefully this will improve with maintenance of SR.   Follow up in the AF  clinic one week post DCCV.   [facility] [address] [place], [place] [address] [phone] 03/12/2020 3:18 PM

## 2020-03-14 ENCOUNTER — Other Ambulatory Visit: Payer: Self-pay | Admitting: Cardiology

## 2020-03-19 ENCOUNTER — Other Ambulatory Visit (HOSPITAL_COMMUNITY)
Admission: RE | Admit: 2020-03-19 | Discharge: 2020-03-19 | Disposition: A | Discharge status: Home / Self Care | Payer: Medicare Other | Source: Ambulatory Visit | Attending: Cardiovascular Disease | Admitting: Cardiovascular Disease

## 2020-03-19 DIAGNOSIS — Z20822 Contact with and (suspected) exposure to covid-19: Secondary | ICD-10-CM | POA: Diagnosis not present

## 2020-03-19 DIAGNOSIS — Z01812 Encounter for preprocedural laboratory examination: Secondary | ICD-10-CM | POA: Insufficient documentation

## 2020-03-19 LAB — SARS CORONAVIRUS 2 (TAT 6-24 HRS): SARS Coronavirus 2: NEGATIVE

## 2020-03-20 ENCOUNTER — Ambulatory Visit (HOSPITAL_COMMUNITY)
Admission: RE | Admit: 2020-03-20 | Discharge: 2020-03-20 | Disposition: A | Discharge status: Home / Self Care | Payer: Medicare Other | Source: Ambulatory Visit | Attending: Physician Assistant | Admitting: Physician Assistant

## 2020-03-20 ENCOUNTER — Other Ambulatory Visit: Payer: Self-pay

## 2020-03-20 VITALS — HR 101

## 2020-03-20 DIAGNOSIS — I4819 Other persistent atrial fibrillation: Secondary | ICD-10-CM

## 2020-03-20 DIAGNOSIS — I4891 Unspecified atrial fibrillation: Secondary | ICD-10-CM | POA: Insufficient documentation

## 2020-03-20 DIAGNOSIS — D6869 Other thrombophilia: Secondary | ICD-10-CM

## 2020-03-20 NOTE — Anesthesia Preprocedure Evaluation (Addendum)
Anesthesia Evaluation  Patient identified by MRN, date of birth, ID band Patient awake    Reviewed: Allergy & Precautions, H&P , NPO status , Patient's Chart, lab work & pertinent test results  Airway Mallampati: III  TM Distance: >3 FB Neck ROM: Full    Dental no notable dental hx. (+) Teeth Intact, Dental Advisory Given   Pulmonary sleep apnea , Current Smoker and Patient abstained from smoking.,    Pulmonary exam normal breath sounds clear to auscultation       Cardiovascular Exercise Tolerance: Good hypertension, Pt. on medications and Pt. on home beta blockers + CAD, + Past MI, + Cardiac Stents and +CHF  + dysrhythmias Atrial Fibrillation  Rhythm:Irregular Rate:Normal     Neuro/Psych  Headaches, CVA negative psych ROS   GI/Hepatic Neg liver ROS, hiatal hernia, GERD  Medicated,  Endo/Other  negative endocrine ROS  Renal/GU negative Renal ROS  negative genitourinary   Musculoskeletal  (+) Arthritis ,   Abdominal   Peds  Hematology negative hematology ROS (+)   Anesthesia Other Findings   Reproductive/Obstetrics negative OB ROS                            Anesthesia Physical Anesthesia Plan  ASA: III  Anesthesia Plan: General   Post-op Pain Management:    Induction: Intravenous  PONV Risk Score and Plan: 1 and Propofol infusion and Treatment may vary due to age or medical condition  Airway Management Planned: Mask  Additional Equipment:   Intra-op Plan:   Post-operative Plan:   Informed Consent: I have reviewed the patients History and Physical, chart, labs and discussed the procedure including the risks, benefits and alternatives for the proposed anesthesia with the patient or authorized representative who has indicated his/her understanding and acceptance.     Dental advisory given  Plan Discussed with: CRNA  Anesthesia Plan Comments:         Anesthesia Quick  Evaluation

## 2020-03-20 NOTE — Progress Notes (Signed)
Patient returns for ECG today. Patient felt he was back in SR. ECG today shows afib HR 101, PVC, QRS 102, QTc 482. Will continue with plan for DCCV tomorrow. F/u as scheduled.

## 2020-03-21 ENCOUNTER — Ambulatory Visit (HOSPITAL_COMMUNITY): Payer: Medicare Other | Admitting: Anesthesiology

## 2020-03-21 ENCOUNTER — Encounter (HOSPITAL_COMMUNITY)
Admission: RE | Disposition: A | Discharge status: Home / Self Care | Payer: Medicare Other | Source: Ambulatory Visit | Attending: Cardiovascular Disease

## 2020-03-21 ENCOUNTER — Other Ambulatory Visit: Payer: Self-pay

## 2020-03-21 ENCOUNTER — Encounter (HOSPITAL_COMMUNITY): Payer: Self-pay | Admitting: Cardiovascular Disease

## 2020-03-21 ENCOUNTER — Ambulatory Visit (HOSPITAL_COMMUNITY)
Admission: RE | Admit: 2020-03-21 | Discharge: 2020-03-21 | Disposition: A | Discharge status: Home / Self Care | Payer: Medicare Other | Source: Ambulatory Visit | Attending: Cardiovascular Disease | Admitting: Cardiovascular Disease

## 2020-03-21 DIAGNOSIS — Z7901 Long term (current) use of anticoagulants: Secondary | ICD-10-CM | POA: Insufficient documentation

## 2020-03-21 DIAGNOSIS — I4891 Unspecified atrial fibrillation: Secondary | ICD-10-CM | POA: Diagnosis not present

## 2020-03-21 DIAGNOSIS — Z881 Allergy status to other antibiotic agents status: Secondary | ICD-10-CM | POA: Diagnosis not present

## 2020-03-21 DIAGNOSIS — K219 Gastro-esophageal reflux disease without esophagitis: Secondary | ICD-10-CM | POA: Insufficient documentation

## 2020-03-21 DIAGNOSIS — E785 Hyperlipidemia, unspecified: Secondary | ICD-10-CM | POA: Insufficient documentation

## 2020-03-21 DIAGNOSIS — I4819 Other persistent atrial fibrillation: Secondary | ICD-10-CM | POA: Insufficient documentation

## 2020-03-21 DIAGNOSIS — I10 Essential (primary) hypertension: Secondary | ICD-10-CM | POA: Diagnosis not present

## 2020-03-21 DIAGNOSIS — E7800 Pure hypercholesterolemia, unspecified: Secondary | ICD-10-CM | POA: Insufficient documentation

## 2020-03-21 DIAGNOSIS — Z888 Allergy status to other drugs, medicaments and biological substances status: Secondary | ICD-10-CM | POA: Diagnosis not present

## 2020-03-21 DIAGNOSIS — G4733 Obstructive sleep apnea (adult) (pediatric): Secondary | ICD-10-CM | POA: Diagnosis not present

## 2020-03-21 DIAGNOSIS — E782 Mixed hyperlipidemia: Secondary | ICD-10-CM | POA: Diagnosis not present

## 2020-03-21 DIAGNOSIS — Z79899 Other long term (current) drug therapy: Secondary | ICD-10-CM | POA: Diagnosis not present

## 2020-03-21 DIAGNOSIS — Z6836 Body mass index (BMI) 36.0-36.9, adult: Secondary | ICD-10-CM | POA: Diagnosis not present

## 2020-03-21 DIAGNOSIS — I252 Old myocardial infarction: Secondary | ICD-10-CM | POA: Diagnosis not present

## 2020-03-21 DIAGNOSIS — I428 Other cardiomyopathies: Secondary | ICD-10-CM | POA: Insufficient documentation

## 2020-03-21 DIAGNOSIS — E669 Obesity, unspecified: Secondary | ICD-10-CM | POA: Diagnosis not present

## 2020-03-21 DIAGNOSIS — I2510 Atherosclerotic heart disease of native coronary artery without angina pectoris: Secondary | ICD-10-CM | POA: Diagnosis not present

## 2020-03-21 DIAGNOSIS — I110 Hypertensive heart disease with heart failure: Secondary | ICD-10-CM | POA: Diagnosis not present

## 2020-03-21 DIAGNOSIS — Z87891 Personal history of nicotine dependence: Secondary | ICD-10-CM | POA: Insufficient documentation

## 2020-03-21 DIAGNOSIS — I480 Paroxysmal atrial fibrillation: Secondary | ICD-10-CM | POA: Diagnosis not present

## 2020-03-21 DIAGNOSIS — I5032 Chronic diastolic (congestive) heart failure: Secondary | ICD-10-CM | POA: Diagnosis not present

## 2020-03-21 HISTORY — PX: CARDIOVERSION: SHX1299

## 2020-03-21 SURGERY — CARDIOVERSION
Anesthesia: General

## 2020-03-21 MED ORDER — SODIUM CHLORIDE 0.9 % IV SOLN
INTRAVENOUS | Status: DC | PRN
Start: 2020-03-21 — End: 2020-03-21

## 2020-03-21 MED ORDER — LIDOCAINE 2% (20 MG/ML) 5 ML SYRINGE
INTRAMUSCULAR | Status: DC | PRN
  Administered 2020-03-21: 60 mg via INTRAVENOUS

## 2020-03-21 MED ORDER — PROPOFOL 10 MG/ML IV BOLUS
INTRAVENOUS | Status: DC | PRN
Start: 2020-03-21 — End: 2020-03-21
  Administered 2020-03-21 (×2): 50 mg via INTRAVENOUS

## 2020-03-21 NOTE — Anesthesia Postprocedure Evaluation (Signed)
Anesthesia Post Note  [name]  Procedure(s) Performed: CARDIOVERSION (N/A )     Patient location during evaluation: Endoscopy Anesthesia Type: General Level of consciousness: awake and alert Pain management: pain level controlled Vital Signs Assessment: post-procedure vital signs reviewed and stable Respiratory status: spontaneous breathing, nonlabored ventilation and respiratory function stable Cardiovascular status: blood pressure returned to baseline and stable Postop Assessment: no apparent nausea or vomiting Anesthetic complications: no    Last Vitals:  Vitals:   03/21/20 0920 03/21/20 0929  BP: 116/81 119/84  Pulse:  64  Resp: 13 18  Temp:    SpO2: 97% 100%    Last Pain:  Vitals:   03/21/20 0929  TempSrc:   PainSc: 0-No pain                 Seniya Stoffers,W. EDMOND

## 2020-03-21 NOTE — Interval H&P Note (Signed)
History and Physical Interval Note:  03/21/2020 8:26 AM  Danny Kelley  has presented today for surgery, with the diagnosis of A-FIB.  The various methods of treatment have been discussed with the patient and family. After consideration of risks, benefits and other options for treatment, the patient has consented to  Procedure(s): CARDIOVERSION (N/A) as a surgical intervention.  The patient's history has been reviewed, patient examined, no change in status, stable for surgery.  I have reviewed the patient's chart and labs.  Questions were answered to the patient's satisfaction.    DCCV today for Afib. NPO. No missed doses of eliquis in past 3 weeks. Recent Ablation.   [place] T. Audie Box, [place]  [address], [place], [place] [address] [phone]  8:26 AM

## 2020-03-21 NOTE — CV Procedure (Addendum)
   DIRECT CURRENT CARDIOVERSION  NAME:  Danny Kelley    MRN: [id number] DOB:  1965-12-20    ADMIT DATE: 03/21/2020  Indication:  Symptomatic atrial fibrillation  Procedure Note:  The patient signed informed consent.  They have had had therapeutic anticoagulation with eliquis greater than 3 weeks.  Anesthesia was administered by Dr. Ola Spurr. 60 mg lidocaine and 100 mg propofol given. Adequate airway was maintained throughout and vital followed per protocol.  They were cardioverted x 1 with 200J of biphasic synchronized energy.  They converted to NSR.  There were no apparent complications.  The patient had normal neuro status and respiratory status post procedure with vitals stable as recorded elsewhere.    Follow up: They will continue on current medical therapy and follow up with cardiology as scheduled.  [place] B[place] T. Audie Box, [place]  [address] [place],  [address] [phone]  9:09 AM

## 2020-03-21 NOTE — Transfer of Care (Signed)
Immediate Anesthesia Transfer of Care Note  Patient: Danny Masse  Procedure(s) Performed: CARDIOVERSION (N/A )  Patient Location: PACU and Endoscopy Unit  Anesthesia Type:General  Level of Consciousness: drowsy, patient cooperative and responds to stimulation  Airway & Oxygen Therapy: Patient Spontanous Breathing  Post-op Assessment: Report given to RN and Post -op Vital signs reviewed and stable  Post vital signs: Reviewed and stable  Last Vitals:  Vitals Value Taken Time  BP    Temp    Pulse    Resp    SpO2      Last Pain:  Vitals:   03/21/20 0823  TempSrc: Oral  PainSc: 0-No pain         Complications: No apparent anesthesia complications

## 2020-03-21 NOTE — Discharge Instructions (Signed)
Electrical Cardioversion Electrical cardioversion is the delivery of a jolt of electricity to restore a normal rhythm to the heart. A rhythm that is too fast or is not regular keeps the heart from pumping well. In this procedure, sticky patches or metal paddles are placed on the chest to deliver electricity to the heart from a device. This procedure may be done in an emergency if:  There is low or no blood pressure as a result of the heart rhythm.  Normal rhythm must be restored as fast as possible to protect the brain and heart from further damage.  It may save a life. This may also be a scheduled procedure for irregular or fast heart rhythms that are not immediately life-threatening. Tell a health care provider about:  Any allergies you have.  All medicines you are taking, including vitamins, herbs, eye drops, creams, and over-the-counter medicines.  Any problems you or family members have had with anesthetic medicines.  Any blood disorders you have.  Any surgeries you have had.  Any medical conditions you have.  Whether you are pregnant or may be pregnant. What are the risks? Generally, this is a safe procedure. However, problems may occur, including:  Allergic reactions to medicines.  A blood clot that breaks free and travels to other parts of your body.  The possible return of an abnormal heart rhythm within hours or days after the procedure.  Your heart stopping (cardiac arrest). This is rare. What happens before the procedure? Medicines  Your health care provider may have you start taking: ? Blood-thinning medicines (anticoagulants) so your blood does not clot as easily. ? Medicines to help stabilize your heart rate and rhythm.  Ask your health care provider about: ? Changing or stopping your regular medicines. This is especially important if you are taking diabetes medicines or blood thinners. ? Taking medicines such as aspirin and ibuprofen. These medicines can  thin your blood. Do not take these medicines unless your health care provider tells you to take them. ? Taking over-the-counter medicines, vitamins, herbs, and supplements. General instructions  Follow instructions from your health care provider about eating or drinking restrictions.  Plan to have someone take you home from the hospital or clinic.  If you will be going home right after the procedure, plan to have someone with you for 24 hours.  Ask your health care provider what steps will be taken to help prevent infection. These may include washing your skin with a germ-killing soap. What happens during the procedure?   An IV will be inserted into one of your veins.  Sticky patches (electrodes) or metal paddles may be placed on your chest.  You will be given a medicine to help you relax (sedative).  An electrical shock will be delivered. The procedure may vary among health care providers and hospitals. What can I expect after the procedure?  Your blood pressure, heart rate, breathing rate, and blood oxygen level will be monitored until you leave the hospital or clinic.  Your heart rhythm will be watched to make sure it does not change.  You may have some redness on the skin where the shocks were given. Follow these instructions at home:  Do not drive for 24 hours if you were given a sedative during your procedure.  Take over-the-counter and prescription medicines only as told by your health care provider.  Ask your health care provider how to check your pulse. Check it often.  Rest for 48 hours after the procedure or   as told by your health care provider.  Avoid or limit your caffeine use as told by your health care provider.  Keep all follow-up visits as told by your health care provider. This is important. Contact a health care provider if:  You feel like your heart is beating too quickly or your pulse is not regular.  You have a serious muscle cramp that does not go  away. Get help right away if:  You have discomfort in your chest.  You are dizzy or you feel faint.  You have trouble breathing or you are short of breath.  Your speech is slurred.  You have trouble moving an arm or leg on one side of your body.  Your fingers or toes turn cold or blue. Summary  Electrical cardioversion is the delivery of a jolt of electricity to restore a normal rhythm to the heart.  This procedure may be done right away in an emergency or may be a scheduled procedure if the condition is not an emergency.  Generally, this is a safe procedure.  After the procedure, check your pulse often as told by your health care provider. This information is not intended to replace advice given to you by your health care provider. Make sure you discuss any questions you have with your health care provider. Document Revised: 05/23/2019 Document Reviewed: 05/23/2019 Elsevier Patient Education  2020 [employer]  

## 2020-03-29 ENCOUNTER — Encounter (HOSPITAL_COMMUNITY): Payer: Self-pay | Admitting: Physician Assistant

## 2020-03-29 ENCOUNTER — Other Ambulatory Visit: Payer: Self-pay

## 2020-03-29 ENCOUNTER — Ambulatory Visit (HOSPITAL_COMMUNITY)
Admission: RE | Admit: 2020-03-29 | Discharge: 2020-03-29 | Disposition: A | Discharge status: Home / Self Care | Payer: Medicare Other | Source: Ambulatory Visit | Attending: Physician Assistant | Admitting: Physician Assistant

## 2020-03-29 VITALS — BP 122/88 | HR 66 | Ht 70.0 in | Wt 252.7 lb

## 2020-03-29 DIAGNOSIS — M1990 Unspecified osteoarthritis, unspecified site: Secondary | ICD-10-CM | POA: Insufficient documentation

## 2020-03-29 DIAGNOSIS — Z885 Allergy status to narcotic agent status: Secondary | ICD-10-CM | POA: Insufficient documentation

## 2020-03-29 DIAGNOSIS — G4733 Obstructive sleep apnea (adult) (pediatric): Secondary | ICD-10-CM | POA: Insufficient documentation

## 2020-03-29 DIAGNOSIS — I4819 Other persistent atrial fibrillation: Secondary | ICD-10-CM | POA: Diagnosis not present

## 2020-03-29 DIAGNOSIS — Z888 Allergy status to other drugs, medicaments and biological substances status: Secondary | ICD-10-CM | POA: Insufficient documentation

## 2020-03-29 DIAGNOSIS — I252 Old myocardial infarction: Secondary | ICD-10-CM | POA: Diagnosis not present

## 2020-03-29 DIAGNOSIS — Z8701 Personal history of pneumonia (recurrent): Secondary | ICD-10-CM | POA: Insufficient documentation

## 2020-03-29 DIAGNOSIS — D6869 Other thrombophilia: Secondary | ICD-10-CM | POA: Diagnosis not present

## 2020-03-29 DIAGNOSIS — Z7901 Long term (current) use of anticoagulants: Secondary | ICD-10-CM | POA: Diagnosis not present

## 2020-03-29 DIAGNOSIS — F17210 Nicotine dependence, cigarettes, uncomplicated: Secondary | ICD-10-CM | POA: Diagnosis not present

## 2020-03-29 DIAGNOSIS — E7800 Pure hypercholesterolemia, unspecified: Secondary | ICD-10-CM | POA: Insufficient documentation

## 2020-03-29 DIAGNOSIS — E669 Obesity, unspecified: Secondary | ICD-10-CM | POA: Diagnosis not present

## 2020-03-29 DIAGNOSIS — Z8249 Family history of ischemic heart disease and other diseases of the circulatory system: Secondary | ICD-10-CM | POA: Insufficient documentation

## 2020-03-29 DIAGNOSIS — I509 Heart failure, unspecified: Secondary | ICD-10-CM | POA: Insufficient documentation

## 2020-03-29 DIAGNOSIS — Z9989 Dependence on other enabling machines and devices: Secondary | ICD-10-CM | POA: Diagnosis not present

## 2020-03-29 DIAGNOSIS — Z955 Presence of coronary angioplasty implant and graft: Secondary | ICD-10-CM | POA: Insufficient documentation

## 2020-03-29 DIAGNOSIS — I428 Other cardiomyopathies: Secondary | ICD-10-CM | POA: Insufficient documentation

## 2020-03-29 DIAGNOSIS — K219 Gastro-esophageal reflux disease without esophagitis: Secondary | ICD-10-CM | POA: Diagnosis not present

## 2020-03-29 DIAGNOSIS — Z881 Allergy status to other antibiotic agents status: Secondary | ICD-10-CM | POA: Diagnosis not present

## 2020-03-29 DIAGNOSIS — Z6836 Body mass index (BMI) 36.0-36.9, adult: Secondary | ICD-10-CM | POA: Insufficient documentation

## 2020-03-29 DIAGNOSIS — Z79899 Other long term (current) drug therapy: Secondary | ICD-10-CM | POA: Insufficient documentation

## 2020-03-29 DIAGNOSIS — I110 Hypertensive heart disease with heart failure: Secondary | ICD-10-CM | POA: Diagnosis not present

## 2020-03-29 DIAGNOSIS — I2510 Atherosclerotic heart disease of native coronary artery without angina pectoris: Secondary | ICD-10-CM | POA: Diagnosis not present

## 2020-03-29 NOTE — Progress Notes (Signed)
Primary Care Physician: [name], NP Primary Cardiologist: Dr Einar Gip Primary Electrophysiologist: Dr Rayann Heman Referring Physician: Dr Rosanne Ashing is a 54 y.o. male with a history of CAD, OSA, tobacco abuse, HTN, HLD, NICM, and persistent atrial fibrillation who presents for follow up in the [facility]. Patient has undergone two afib ablations with Dr Rayann Heman in 2018 and on 03/01/20. He has previously failed dofetilide and sotalol 2/2 QT prolongation. He has also been on Multaq which was stopped after his first ablation. Patient is on Eliquis for a CHADS2VASC score of 3. Unfortunately, patient felt he was back in afib the evening of 03/09/20 with symptoms of fatigue and palpitations. There were no specific triggers that the patient could identify.   On follow up today, patient is s/p DCCV on 03/21/20. Patient reports his fatigue and palpitations have improved since his DCCV. He did have one brief episode of palpitations which resolved quickly. He denies bleeding issues with anticoagulation.   Today, he denies symptoms of palpitations, chest pain, shortness of breath, orthopnea, PND, lower extremity edema, presyncope, syncope,  bleeding, or neurologic sequela. The patient is tolerating medications without difficulties and is otherwise without complaint today.    Atrial Fibrillation Risk Factors:  he does have symptoms or diagnosis of sleep apnea. he is compliant with CPAP therapy. he does not have a history of rheumatic fever. He does have a family history of afib. Mother and father.  he has a BMI of Body mass index is 36.25 kg/m.Marland Kitchen Filed Weights   03/29/20 1412  Weight: 114.6 kg    Family History  Problem Relation Age of Onset  . Heart disease Mother 39       CABG age 68  . Breast cancer Mother   . Tongue cancer Mother   . Diabetes Mother   . Heart attack Father 82       Father had around 7 heart attacks per pt  . Diabetes Father   . Lung  cancer Father   . Congestive Heart Failure Father   . Lung cancer Paternal Uncle      Atrial Fibrillation Management history:  Previous antiarrhythmic drugs: Multaq, dofetilide, sotalol Previous cardioversions: 2017 x2, 03/21/20 Previous ablations: 2018, 03/01/20 CHADS2VASC score: 3 Anticoagulation history: Eliquis   Past Medical History:  Diagnosis Date  . Arthritis    "a little in my right knee" (07/14/2016)  . CAD (coronary artery disease)   . Chronic congestive heart failure with left ventricular diastolic dysfunction ([place])   . Complication of anesthesia    Pt reports "they have a hard time waking me up"  . GERD (gastroesophageal reflux disease)    hx  . Headache    "with every heart issue that I have" (07/14/2016)  . High cholesterol   . History of hiatal hernia 1990s   "fixed it w/scope down my throat"  . Hypertension   . Myocardial infarction [facility]) 05/2013   stents: left PDA, 1st OM at [facility]  . Obesity   . Persistent atrial fibrillation ([place])   . Pneumonia ~ 1992  . QT prolongation 07/15/2016  . Seasonal allergies    "I take Allegra prn" (07/14/2016)  . Tobacco abuse 12/16/2016   Past Surgical History:  Procedure Laterality Date  . APPENDECTOMY  1984  . ATRIAL FIBRILLATION ABLATION N/A 03/01/2020   Procedure: ATRIAL FIBRILLATION ABLATION;  Surgeon: [name], MD;  Location: [place] CV LAB;  Service: Cardiovascular;  Laterality:  N/A;  . CARDIOVERSION N/A 05/27/2016   Procedure: CARDIOVERSION;  Surgeon: [name], MD;  Location: [facility] ENDOSCOPY;  Service: Cardiovascular;  Laterality: N/A;  . CARDIOVERSION N/A 06/11/2016   Procedure: CARDIOVERSION;  Surgeon: [name], MD;  Location: [place];  Service: Cardiovascular;  Laterality: N/A;  . CARDIOVERSION N/A 03/21/2020   Procedure: CARDIOVERSION;  Surgeon: Geralynn Rile, MD;  Location: [place];  Service: Cardiovascular;  Laterality: N/A;  . CORONARY ANGIOPLASTY WITH STENT PLACEMENT   05/30/2013   "2 stents put in at [facility]" & /stent card  . CORONARY STENT INTERVENTION N/A 11/27/2017   Procedure: CORONARY STENT INTERVENTION;  Surgeon: [name], MD;  Location: [place] CV LAB;  Service: Cardiovascular;  Laterality: N/A;  . CORONARY STENT INTERVENTION N/A 03/21/2019   Procedure: CORONARY STENT INTERVENTION;  Surgeon: [name], MD;  Location: [place] CV LAB;  Service: Cardiovascular;  Laterality: N/A;  . ELECTROPHYSIOLOGIC STUDY N/A 11/13/2016   Procedure: Atrial Fibrillation Ablation;  Surgeon: [name], MD;  Location: [place] CV LAB;  Service: Cardiovascular;  Laterality: N/A;  . ESOPHAGOGASTRODUODENOSCOPY (EGD) WITH ESOPHAGEAL DILATION  1990s X 2  . INTRAVASCULAR PRESSURE WIRE/FFR STUDY N/A 09/21/2018   Procedure: INTRAVASCULAR PRESSURE WIRE/FFR STUDY;  Surgeon: [name], MD;  Location: [place] CV LAB;  Service: Cardiovascular;  Laterality: N/A;  . INTRAVASCULAR PRESSURE WIRE/FFR STUDY N/A 03/21/2019   Procedure: INTRAVASCULAR PRESSURE WIRE/FFR STUDY;  Surgeon: [name], MD;  Location: [place] CV LAB;  Service: Cardiovascular;  Laterality: N/A;  . INTRAVASCULAR PRESSURE WIRE/FFR STUDY N/A 01/24/2020   Procedure: INTRAVASCULAR PRESSURE WIRE/FFR STUDY;  Surgeon: [name], MD;  Location: [place] CV LAB;  Service: Cardiovascular;  Laterality: N/A;  . KNEE ARTHROSCOPY Right 1986; ~ 1995 X 2  . LAPAROSCOPIC CHOLECYSTECTOMY  2015  . LEFT HEART CATH AND CORONARY ANGIOGRAPHY N/A 11/27/2017   Procedure: LEFT HEART CATH AND CORONARY ANGIOGRAPHY;  Surgeon: [name], MD;  Location: [place] CV LAB;  Service: Cardiovascular;  Laterality: N/A;  . LEFT HEART CATH AND CORONARY ANGIOGRAPHY N/A 09/21/2018   Procedure: LEFT HEART CATH AND CORONARY ANGIOGRAPHY;  Surgeon: [name], MD;  Location: [place] CV LAB;  Service: Cardiovascular;  Laterality: N/A;  . LEFT HEART CATH AND CORONARY ANGIOGRAPHY N/A 03/21/2019   Procedure: LEFT HEART CATH AND  CORONARY ANGIOGRAPHY;  Surgeon: [name], MD;  Location: [place] CV LAB;  Service: Cardiovascular;  Laterality: N/A;  . REPAIR OF ESOPHAGUS  1998   perforation of the distal esophagus from bad heartburn  . RIGHT/LEFT HEART CATH AND CORONARY ANGIOGRAPHY N/A 01/24/2020   Procedure: RIGHT/LEFT HEART CATH AND CORONARY ANGIOGRAPHY;  Surgeon: [name], MD;  Location: [place] CV LAB;  Service: Cardiovascular;  Laterality: N/A;  . TEE WITHOUT CARDIOVERSION N/A 11/13/2016   Procedure: TRANSESOPHAGEAL ECHOCARDIOGRAM (TEE);  Surgeon: Thayer Headings, MD;  Location: [facility] ENDOSCOPY;  Service: Cardiovascular;  Laterality: N/A;    Current Outpatient Medications  Medication Sig Dispense Refill  . acetaminophen (TYLENOL) 500 MG tablet Take 500 mg by mouth 2 (two) times daily as needed for moderate pain.    Marland Kitchen amLODipine (NORVASC) 10 MG tablet Take 1 tablet (10 mg total) by mouth daily. 90 tablet 1  . apixaban (ELIQUIS) 5 MG TABS tablet Take 1 tablet (5 mg total) by mouth 2 (two) times daily. 180 tablet 3  . bisacodyl (DULCOLAX) 5 MG EC tablet Take 5 mg by mouth daily as needed.     . diltiazem (CARDIZEM) 30 MG  tablet Take 1 tablet every 4 hours AS NEEDED for heart rate >110 45 tablet 1  . docusate sodium (PX DOCUSATE SODIUM) 100 MG capsule Take 100 mg by mouth 2 (two) times daily as needed.     . ezetimibe (ZETIA) 10 MG tablet Take 1 tablet (10 mg total) by mouth daily. 90 tablet 0  . fexofenadine (ALLEGRA) 180 MG tablet Take 180 mg by mouth daily as needed (seasonal allergies).     . hydrALAZINE (APRESOLINE) 50 MG tablet Take 1 tablet (50 mg total) by mouth 3 (three) times daily. 90 tablet 2  . metoprolol tartrate (LOPRESSOR) 100 MG tablet Take 1 tablet (100 mg total) by mouth 2 (two) times daily. 60 tablet 2  . nitroGLYCERIN (NITROSTAT) 0.4 MG SL tablet DISSOLVE ONE TABLET UNDER THE TONGUE EVERY 5 MINUTES AS NEEDED FOR CHEST PAIN.  DO NOT EXCEED A TOTAL OF 3 DOSES IN 15 MINUTES 25 tablet 0  .  pantoprazole (PROTONIX) 40 MG tablet Take 1 tablet (40 mg total) by mouth daily. 45 tablet 0  . sacubitril-valsartan (ENTRESTO) 49-51 MG Take 1 tablet by mouth 2 (two) times daily. 60 tablet 3  . shark liver oil-cocoa butter (PREPARATION H) 0.25-3-85.5 % suppository Place 1 suppository rectally 2 (two) times daily as needed for hemorrhoids.    . torsemide (DEMADEX) 20 MG tablet Take 20 mg by mouth daily as needed (swelling).      No current facility-administered medications for this encounter.    Allergies  Allergen Reactions  . Codeine Anaphylaxis  . Crestor [Rosuvastatin] Other (See Comments)    Severe leg cramps  . Dilaudid [Hydromorphone] Itching and Swelling  . Isosorbide     Muscle pain and cramps  . Lexapro [Escitalopram]     Have bad thoughts made anxiety worse   . Lipitor [Atorvastatin Calcium] Other (See Comments)    myalgias  . Erythromycin Other (See Comments)    Hallucinations   . Oxycodone Itching and Rash    Swelling to face, hands, mouth Completley intolerant to any meds with oxycodone in it     Social History   Socioeconomic History  . Marital status: Married    Spouse name: Not on file  . Number of children: 2  . Years of education: Not on file  . Highest education level: Not on file  Occupational History  . Not on file  Tobacco Use  . Smoking status: Current Every Day Smoker    Years: 25.00    Types: Cigarettes, E-cigarettes    Last attempt to quit: 01/15/2020    Years since quitting: 0.2  . Smokeless tobacco: Never Used  . Tobacco comment: 5 cigs every 3 days  Substance and Sexual Activity  . Alcohol use: No  . Drug use: No  . Sexual activity: Yes  Other Topics Concern  . Not on file  Social History Narrative   Pt lives in [place] with spouse and 7 year old son.   disabled   Social Determinants of Health   Financial Resource Strain:   . Difficulty of Paying Living Expenses:   Food Insecurity:   . Worried About Charity fundraiser in  the Last Year:   . Arboriculturist in the Last Year:   Transportation Needs:   . Film/video editor (Medical):   Marland Kitchen Lack of Transportation (Non-Medical):   Physical Activity:   . Days of Exercise per Week:   . Minutes of Exercise per Session:   Stress:   .  Feeling of Stress :   Social Connections:   . Frequency of Communication with Friends and Family:   . Frequency of Social Gatherings with Friends and Family:   . Attends Religious Services:   . Active Member of Clubs or Organizations:   . Attends Archivist Meetings:   Marland Kitchen Marital Status:   Intimate Partner Violence:   . Fear of Current or Ex-Partner:   . Emotionally Abused:   Marland Kitchen Physically Abused:   . Sexually Abused:      ROS- All systems are reviewed and negative except as per the HPI above.  Physical Exam: Vitals:   03/29/20 1412  BP: 122/88  Pulse: 66  Weight: 114.6 kg  Height: 5\' 10"  (1.778 m)    GEN- The patient is well appearing obese male, alert and oriented x 3 today.   HEENT-head normocephalic, atraumatic, sclera clear, conjunctiva pink, hearing intact, trachea midline. Lungs- Clear to ausculation bilaterally, normal work of breathing Heart- Regular rate and rhythm, no murmurs, rubs or gallops  GI- soft, NT, ND, + BS Extremities- no clubbing, cyanosis, or edema MS- no significant deformity or atrophy Skin- no rash or lesion Psych- euthymic mood, full affect Neuro- strength and sensation are intact   Wt Readings from Last 3 Encounters:  03/29/20 114.6 kg  03/12/20 115.8 kg  03/01/20 112 kg    EKG today demonstrates SR HR 66, NST, PR 152, QRS 100, QTc 389  Echo 12/28/19 demonstrated  Severely depressed LV systolic function with visual EF 25-30%. Severe  global hypokinesis Left ventricle cavity is normal in size. Moderate left  ventricular hypertrophy. Doppler evidence of grade II diastolic  dysfunction, elevated LAP. Calculated EF 29%.  No significant valvular abnormalities.  Left  atrial cavity is mildly dilated.  Right ventricle cavity is normal in size but visually low normal right  ventricular function.  The aortic root is dilated, 4.0cm at the level of sinus of Valsalva.  Proximal ascending aorta not well visualized.  Prior study dated 11/27/2017: Normal LVEF. Mild LVH. Normal diastolic  parameters. Mild LA enlargement.  Epic records are reviewed at length today  CHA2DS2-VASc Score = 3  The patient's score is based upon: CHF History: 1 HTN History: 1 Age : 0 Diabetes History: 0 Stroke History: 0 Vascular Disease History: 1 Gender: 0      ASSESSMENT AND PLAN: 1. Persistent Atrial Fibrillation (ICD10:  I48.19) The patient's CHA2DS2-VASc score is 3, indicating a 3.2% annual risk of stroke.   S/p afib ablation 03/01/20 with Dr Rayann Heman. S/p DCCV on 03/21/20. Patient appears to be maintaining SR. Continue Eliquis 5 mg BID Continue Lopressor 100 mg BID Continue diltiazem 30 mg PRN q 4hours for heart rate >110 bpm.  Could consider Multaq if his afib should become persistent. Would avoid class 1C with h/o CAD and he has previously failed sotalol and dofetilide 2/2 QT prolongation.   2. Secondary Hypercoagulable State (ICD10:  D68.69) The patient is at significant risk for stroke/thromboembolism based upon his CHA2DS2-VASc Score of 3.  Continue Apixaban (Eliquis).   3. Obesity Body mass index is 36.25 kg/m. Lifestyle modification was discussed and encouraged including regular physical activity and weight reduction.  4. Obstructive sleep apnea Patient reports compliance with CPAP therapy.  5. HTN Good today but historically labile.  Followed by Dr Einar Gip.  6. CAD No anginal symptoms.  7. NICM Echo shows EF 25-30%. Suspected 2/2 afib. Hopefully this will improve with SR.   Follow up with Dr Rayann Heman as scheduled.  AF clinic as needed.    [facility] [address] [place], [place]  [address] [phone] 03/29/2020 2:37 PM

## 2020-03-30 DIAGNOSIS — I10 Essential (primary) hypertension: Secondary | ICD-10-CM | POA: Diagnosis not present

## 2020-04-08 ENCOUNTER — Other Ambulatory Visit: Payer: Self-pay | Admitting: Cardiology

## 2020-04-08 DIAGNOSIS — E7800 Pure hypercholesterolemia, unspecified: Secondary | ICD-10-CM

## 2020-04-21 ENCOUNTER — Other Ambulatory Visit: Payer: Self-pay | Admitting: Cardiology

## 2020-04-21 DIAGNOSIS — I5041 Acute combined systolic (congestive) and diastolic (congestive) heart failure: Secondary | ICD-10-CM

## 2020-04-21 DIAGNOSIS — I4821 Permanent atrial fibrillation: Secondary | ICD-10-CM

## 2020-04-25 ENCOUNTER — Other Ambulatory Visit: Payer: Self-pay

## 2020-04-25 DIAGNOSIS — I4821 Permanent atrial fibrillation: Secondary | ICD-10-CM

## 2020-04-25 DIAGNOSIS — I5041 Acute combined systolic (congestive) and diastolic (congestive) heart failure: Secondary | ICD-10-CM

## 2020-04-25 MED ORDER — APIXABAN 5 MG PO TABS: 5.0000 mg | ORAL_TABLET | Freq: Two times a day (BID) | ORAL | 0 refills | Status: DC

## 2020-04-25 MED ORDER — METOPROLOL TARTRATE 100 MG PO TABS: 100.0000 mg | ORAL_TABLET | Freq: Two times a day (BID) | ORAL | 0 refills | Status: DC

## 2020-04-25 MED ORDER — NITROGLYCERIN 0.4 MG SL SUBL: 0.4000 mg | SUBLINGUAL_TABLET | SUBLINGUAL | 0 refills | Status: DC | PRN

## 2020-05-02 DIAGNOSIS — I10 Essential (primary) hypertension: Secondary | ICD-10-CM | POA: Diagnosis not present

## 2020-05-31 ENCOUNTER — Other Ambulatory Visit: Payer: Self-pay

## 2020-05-31 DIAGNOSIS — I5042 Chronic combined systolic (congestive) and diastolic (congestive) heart failure: Secondary | ICD-10-CM

## 2020-05-31 DIAGNOSIS — I10 Essential (primary) hypertension: Secondary | ICD-10-CM

## 2020-05-31 MED ORDER — HYDRALAZINE HCL 50 MG PO TABS: 50.0000 mg | ORAL_TABLET | Freq: Three times a day (TID) | ORAL | 2 refills | Status: DC

## 2020-05-31 MED ORDER — EZETIMIBE 10 MG PO TABS: 10.0000 mg | ORAL_TABLET | Freq: Every day | ORAL | 0 refills | Status: DC

## 2020-05-31 MED ORDER — APIXABAN 5 MG PO TABS: 5.0000 mg | ORAL_TABLET | Freq: Two times a day (BID) | ORAL | 0 refills | Status: DC

## 2020-06-02 DIAGNOSIS — I10 Essential (primary) hypertension: Secondary | ICD-10-CM | POA: Diagnosis not present

## 2020-06-04 ENCOUNTER — Encounter: Payer: Self-pay | Admitting: Internal Medicine

## 2020-06-04 ENCOUNTER — Other Ambulatory Visit: Payer: Self-pay

## 2020-06-04 ENCOUNTER — Ambulatory Visit (INDEPENDENT_AMBULATORY_CARE_PROVIDER_SITE_OTHER): Payer: Medicare Other | Admitting: Internal Medicine

## 2020-06-04 VITALS — BP 124/82 | HR 80 | Ht 70.5 in | Wt 250.0 lb

## 2020-06-04 DIAGNOSIS — I428 Other cardiomyopathies: Secondary | ICD-10-CM | POA: Diagnosis not present

## 2020-06-04 DIAGNOSIS — I10 Essential (primary) hypertension: Secondary | ICD-10-CM

## 2020-06-04 DIAGNOSIS — I4819 Other persistent atrial fibrillation: Secondary | ICD-10-CM

## 2020-06-04 DIAGNOSIS — I25118 Atherosclerotic heart disease of native coronary artery with other forms of angina pectoris: Secondary | ICD-10-CM | POA: Diagnosis not present

## 2020-06-04 NOTE — Patient Instructions (Addendum)
Medication Instructions:  Your physician recommends that you continue on your current medications as directed. Please refer to the Current Medication list given to you today.  *If you need a refill on your cardiac medications before your next appointment, please call your pharmacy*  Lab Work: None ordered.  If you have labs (blood work) drawn today and your tests are completely normal, you will receive your results only by:  [place] (if you have MyChart) OR  A paper copy in the mail If you have any lab test that is abnormal or we need to change your treatment, we will call you to review the results.  Testing/Procedures: None ordered.  Follow-Up: At [facility], you and your health needs are our priority.  As part of our continuing mission to provide you with exceptional heart care, we have created designated Provider Care Teams.  These Care Teams include your primary Cardiologist (physician) and Advanced Practice Providers (APPs -  Physician Assistants and Nurse Practitioners) who all work together to provide you with the care you need, when you need it.  We recommend signing up for the patient portal called "MyChart".  Sign up information is provided on this After Visit Summary.  MyChart is used to connect with patients for Virtual Visits (Telemedicine).  Patients are able to view lab/test results, encounter notes, upcoming appointments, etc.  Non-urgent messages can be sent to your provider as well.   To learn more about what you can do with MyChart, go to [email or web address].    Your next appointment:   Your physician wants you to follow-up in: 09/03/2020 at 11:45 with Dr. Rayann Heman.    Other Instructions:

## 2020-06-04 NOTE — Progress Notes (Signed)
PCP: [name], NP Primary Cardiologist: Dr Griselda Miner is a 54 y.o. male who presents today for routine electrophysiology followup.  Since his recent afib ablation, the patient reports doing reasonably well.  He continues to have ERAF post ablation.  + palpitations.  he denies procedure related complications. His chronic SOB did not improve with sinus. Today, he denies symptoms of chest pain,  lower extremity edema, dizziness, presyncope, or syncope.  The patient is otherwise without complaint today.   Past Medical History:  Diagnosis Date   Arthritis    "a little in my right knee" (07/14/2016)   CAD (coronary artery disease)    Chronic congestive heart failure with left ventricular diastolic dysfunction (H[facility])    Complication of anesthesia    Pt reports "they have a hard time waking me up"   GERD (gastroesophageal reflux disease)    hx   Headache    "with every heart issue that I have" (07/14/2016)   High cholesterol    History of hiatal hernia 1990s   "fixed it w/scope down my throat"   Hypertension    Myocardial infarction ([place]) 05/2013   stents: left PDA, 1st OM at [facility]   Obesity    Persistent atrial fibrillation ([place])    Pneumonia ~ 1992   QT prolongation 07/15/2016   Seasonal allergies    "I take Allegra prn" (07/14/2016)   Tobacco abuse 12/16/2016   Past Surgical History:  Procedure Laterality Date   APPENDECTOMY  1984   ATRIAL FIBRILLATION ABLATION N/A 03/01/2020   Procedure: ATRIAL FIBRILLATION ABLATION;  Surgeon: [name], MD;  Location: [place] CV LAB;  Service: Cardiovascular;  Laterality: N/A;   CARDIOVERSION N/A 05/27/2016   Procedure: CARDIOVERSION;  Surgeon: [name], MD;  [facility] ENDOSCOPY;  Service: Cardiovascular;  Laterality: N/A;   CARDIOVERSION N/A 06/11/2016   Procedure: CARDIOVERSION;  Surgeon: [name], MD;  Location: [place];  Service: Cardiovascular;  Laterality: N/A;   CARDIOVERSION  N/A 03/21/2020   Procedure: CARDIOVERSION;  Surgeon: Geralynn Rile, MD;  Location: [place];  Service: Cardiovascular;  Laterality: N/A;   CORONARY ANGIOPLASTY WITH STENT PLACEMENT  05/30/2013   "2 stents put in at [facility]" & /stent card   CORONARY STENT INTERVENTION N/A 11/27/2017   Procedure: CORONARY STENT INTERVENTION;  Surgeon: [name], MD;  Location: [place] CV LAB;  Service: Cardiovascular;  Laterality: N/A;   CORONARY STENT INTERVENTION N/A 03/21/2019   Procedure: CORONARY STENT INTERVENTION;  Surgeon: [name], MD;  Location: [place] CV LAB;  Service: Cardiovascular;  Laterality: N/A;   ELECTROPHYSIOLOGIC STUDY N/A 11/13/2016   Procedure: Atrial Fibrillation Ablation;  Surgeon: [name], MD;  Location: [place] CV LAB;  Service: Cardiovascular;  Laterality: N/A;   ESOPHAGOGASTRODUODENOSCOPY (EGD) WITH ESOPHAGEAL DILATION  1990s X 2   INTRAVASCULAR PRESSURE WIRE/FFR STUDY N/A 09/21/2018   Procedure: INTRAVASCULAR PRESSURE WIRE/FFR STUDY;  Surgeon: [name], MD;  Location: [place] CV LAB;  Service: Cardiovascular;  Laterality: N/A;   INTRAVASCULAR PRESSURE WIRE/FFR STUDY N/A 03/21/2019   Procedure: INTRAVASCULAR PRESSURE WIRE/FFR STUDY;  Surgeon: [name], MD;  Location: [place] CV LAB;  Service: Cardiovascular;  Laterality: N/A;   INTRAVASCULAR PRESSURE WIRE/FFR STUDY N/A 01/24/2020   Procedure: INTRAVASCULAR PRESSURE WIRE/FFR STUDY;  Surgeon: [name], MD;  Location: [place] [facility] LAB;  Service: Cardiovascular;  Laterality: N/A;   KNEE ARTHROSCOPY Right 1986; ~ 1995 X [place]  2015  LEFT HEART CATH AND CORONARY ANGIOGRAPHY N/A 11/27/2017   Procedure: LEFT HEART CATH AND CORONARY ANGIOGRAPHY;  Surgeon: [name], MD;  Location: [place] CV LAB;  Service: Cardiovascular;  Laterality: N/A;   LEFT HEART CATH AND CORONARY ANGIOGRAPHY N/A 09/21/2018   Procedure: LEFT HEART CATH AND CORONARY ANGIOGRAPHY;   Surgeon: [name], MD;  Location: [place] CV LAB;  Service: Cardiovascular;  Laterality: N/A;   LEFT HEART CATH AND CORONARY ANGIOGRAPHY N/A 03/21/2019   Procedure: LEFT HEART CATH AND CORONARY ANGIOGRAPHY;  Surgeon: [name], MD;  Location: [place] CV LAB;  Service: Cardiovascular;  Laterality: N/A;   REPAIR OF ESOPHAGUS  1998   perforation of the distal esophagus from bad heartburn   RIGHT/LEFT HEART CATH AND CORONARY ANGIOGRAPHY N/A 01/24/2020   Procedure: RIGHT/LEFT HEART CATH AND CORONARY ANGIOGRAPHY;  Surgeon: [name], MD;  Location: [place] CV LAB;  Service: Cardiovascular;  Laterality: N/A;   TEE WITHOUT CARDIOVERSION N/A 11/13/2016   Procedure: TRANSESOPHAGEAL ECHOCARDIOGRAM (TEE);  Surgeon: Thayer Headings, MD;  Location: [facility] ENDOSCOPY;  Service: Cardiovascular;  Laterality: N/A;    ROS- all systems are personally reviewed and negatives except as per HPI above  Current Outpatient Medications  Medication Sig Dispense Refill   acetaminophen (TYLENOL) 500 MG tablet Take 500 mg by mouth 2 (two) times daily as needed for moderate pain.     amLODipine (NORVASC) 10 MG tablet Take 1 tablet (10 mg total) by mouth daily. 90 tablet 1   apixaban (ELIQUIS) 5 MG TABS tablet Take 1 tablet (5 mg total) by mouth 2 (two) times daily. 60 tablet 0   bisacodyl (DULCOLAX) 5 MG EC tablet Take 5 mg by mouth daily as needed.      diltiazem (CARDIZEM) 30 MG tablet Take 1 tablet every 4 hours AS NEEDED for heart rate >110 45 tablet 1   docusate sodium (PX DOCUSATE SODIUM) 100 MG capsule Take 100 mg by mouth 2 (two) times daily as needed.      ezetimibe (ZETIA) 10 MG tablet Take 1 tablet (10 mg total) by mouth daily. 90 tablet 0   fexofenadine (ALLEGRA) 180 MG tablet Take 180 mg by mouth daily as needed (seasonal allergies).      hydrALAZINE (APRESOLINE) 50 MG tablet Take 1 tablet (50 mg total) by mouth 3 (three) times daily. 90 tablet 2   metoprolol tartrate (LOPRESSOR) 100 MG  tablet Take 1 tablet (100 mg total) by mouth 2 (two) times daily. 180 tablet 0   nitroGLYCERIN (NITROSTAT) 0.4 MG SL tablet Place 1 tablet (0.4 mg total) under the tongue every 5 (five) minutes as needed for chest pain. 25 tablet 0   sacubitril-valsartan (ENTRESTO) 49-51 MG Take 1 tablet by mouth 2 (two) times daily. 60 tablet 3   shark liver oil-cocoa butter (PREPARATION H) 0.25-3-85.5 % suppository Place 1 suppository rectally 2 (two) times daily as needed for hemorrhoids.     torsemide (DEMADEX) 20 MG tablet Take 20 mg by mouth daily as needed (swelling).      pantoprazole (PROTONIX) 40 MG tablet Take 1 tablet (40 mg total) by mouth daily. 45 tablet 0   No current facility-administered medications for this visit.    Physical Exam: Vitals:   06/04/20 1344  BP: 124/82  Pulse: 80  SpO2: 95%  Weight: 250 lb (113.4 kg)  Height: 5' 10.5" (1.791 m)    GEN- The patient is overweight appearing, alert and oriented x 3 today.   Head- normocephalic, atraumatic Eyes-  Sclera clear, conjunctiva pink Ears- hearing intact Oropharynx- clear Lungs-  normal work of breathing Heart- Regular rate and rhythm with ectopy GI- soft, NT, ND, + BS Extremities- no clubbing, cyanosis, or edema  EKG tracing ordered today is personally reviewed and shows sinus with PVCs   Assessment and Plan:  1. Persistent atrial fibrillation Doing well s/p redo ablation 03/01/20 chads2vasc score is 3.  He is on eliquis He has had ERAF I have advised that he obtain [employer] to further evaluate his palpitations  2. Nonischemic CM Hopefully EF will improve with sinus We will eventually repeat an echo once his rhythm is controlled  3. 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 Morbid obesity Body mass index is 35.36 kg/m. lifestyle modification is advised  5. HTN Stable No change required today  Return to see me in 3 months  [name] MD, [facility] 06/04/2020 2:00 PM

## 2020-06-29 ENCOUNTER — Other Ambulatory Visit: Payer: Self-pay

## 2020-06-29 MED ORDER — ENTRESTO 49-51 MG PO TABS: 1.0000 | ORAL_TABLET | Freq: Two times a day (BID) | ORAL | 3 refills | Status: DC

## 2020-06-29 MED ORDER — APIXABAN 5 MG PO TABS: 5.0000 mg | ORAL_TABLET | Freq: Two times a day (BID) | ORAL | 3 refills | Status: DC

## 2020-07-02 NOTE — Telephone Encounter (Signed)
Left message to call the office.

## 2020-07-03 DIAGNOSIS — I10 Essential (primary) hypertension: Secondary | ICD-10-CM | POA: Diagnosis not present

## 2020-07-19 ENCOUNTER — Other Ambulatory Visit: Payer: Self-pay | Admitting: Cardiology

## 2020-07-19 DIAGNOSIS — I4821 Permanent atrial fibrillation: Secondary | ICD-10-CM

## 2020-07-19 DIAGNOSIS — I5041 Acute combined systolic (congestive) and diastolic (congestive) heart failure: Secondary | ICD-10-CM

## 2020-07-19 DIAGNOSIS — I10 Essential (primary) hypertension: Secondary | ICD-10-CM

## 2020-07-23 ENCOUNTER — Telehealth: Payer: Self-pay | Admitting: Internal Medicine

## 2020-07-23 NOTE — Telephone Encounter (Signed)
Advised the patient that I have been in touch with the Afib clinic and they want him to come in for a visit related to his previous mychart communication I gave him the number to call to get an appointment set up so we could get him feeling better. The patient verbalized understanding and states he would call them today.

## 2020-07-23 NOTE — Telephone Encounter (Signed)
New message   Pt returning call to Otila Kluver, call transfer to [facility]

## 2020-07-24 ENCOUNTER — Encounter (HOSPITAL_COMMUNITY): Payer: Self-pay | Admitting: Physician Assistant

## 2020-07-24 ENCOUNTER — Other Ambulatory Visit: Payer: Self-pay

## 2020-07-24 ENCOUNTER — Ambulatory Visit (HOSPITAL_COMMUNITY)
Admission: RE | Admit: 2020-07-24 | Discharge: 2020-07-24 | Disposition: A | Discharge status: Home / Self Care | Payer: Medicare Other | Source: Ambulatory Visit | Attending: Physician Assistant | Admitting: Physician Assistant

## 2020-07-24 VITALS — BP 120/88 | HR 61 | Ht 70.5 in | Wt 251.0 lb

## 2020-07-24 DIAGNOSIS — I4819 Other persistent atrial fibrillation: Secondary | ICD-10-CM | POA: Diagnosis not present

## 2020-07-24 DIAGNOSIS — Z7901 Long term (current) use of anticoagulants: Secondary | ICD-10-CM | POA: Diagnosis not present

## 2020-07-24 DIAGNOSIS — K219 Gastro-esophageal reflux disease without esophagitis: Secondary | ICD-10-CM | POA: Insufficient documentation

## 2020-07-24 DIAGNOSIS — E669 Obesity, unspecified: Secondary | ICD-10-CM | POA: Insufficient documentation

## 2020-07-24 DIAGNOSIS — I428 Other cardiomyopathies: Secondary | ICD-10-CM | POA: Diagnosis not present

## 2020-07-24 DIAGNOSIS — I2510 Atherosclerotic heart disease of native coronary artery without angina pectoris: Secondary | ICD-10-CM | POA: Insufficient documentation

## 2020-07-24 DIAGNOSIS — Z6835 Body mass index (BMI) 35.0-35.9, adult: Secondary | ICD-10-CM | POA: Insufficient documentation

## 2020-07-24 DIAGNOSIS — F17210 Nicotine dependence, cigarettes, uncomplicated: Secondary | ICD-10-CM | POA: Diagnosis not present

## 2020-07-24 DIAGNOSIS — I252 Old myocardial infarction: Secondary | ICD-10-CM | POA: Insufficient documentation

## 2020-07-24 DIAGNOSIS — I10 Essential (primary) hypertension: Secondary | ICD-10-CM | POA: Diagnosis not present

## 2020-07-24 DIAGNOSIS — E7800 Pure hypercholesterolemia, unspecified: Secondary | ICD-10-CM | POA: Insufficient documentation

## 2020-07-24 DIAGNOSIS — Z79899 Other long term (current) drug therapy: Secondary | ICD-10-CM | POA: Diagnosis not present

## 2020-07-24 DIAGNOSIS — Z955 Presence of coronary angioplasty implant and graft: Secondary | ICD-10-CM | POA: Insufficient documentation

## 2020-07-24 DIAGNOSIS — D6869 Other thrombophilia: Secondary | ICD-10-CM | POA: Diagnosis not present

## 2020-07-24 DIAGNOSIS — R9431 Abnormal electrocardiogram [ECG] [EKG]: Secondary | ICD-10-CM | POA: Insufficient documentation

## 2020-07-24 DIAGNOSIS — G4733 Obstructive sleep apnea (adult) (pediatric): Secondary | ICD-10-CM | POA: Insufficient documentation

## 2020-07-24 NOTE — Progress Notes (Signed)
Primary Care Physician: [name], NP Primary Cardiologist: Dr Einar Gip Primary Electrophysiologist: Dr Rayann Heman Referring Physician: Dr Rosanne Ashing is a 54 y.o. male with a history of CAD, OSA, tobacco abuse, HTN, HLD, NICM, and persistent atrial fibrillation who presents for follow up in the [facility]. Patient has undergone two afib ablations with Dr Rayann Heman in 2018 and on 03/01/20. He has previously failed dofetilide and sotalol 2/2 QT prolongation. He has also been on Multaq which was stopped after his first ablation. Patient is on Eliquis for a CHADS2VASC score of 3. Unfortunately, patient felt he was back in afib the evening of 03/09/20 with symptoms of fatigue and palpitations. There were no specific triggers that the patient could identify. Patient is s/p DCCV on 03/21/20.   On follow up today, patient reports that since his DCCV he has had frequent episodes of afib which lasted about 1-2 hours. About two weeks ago, he was persistently in afib. He had fast and slow heart rates noted on his home BP machine ranging from 40-110 bpm. When his heart rate becomes slow he feels "like he is drunk". No syncopal events. He is back in SR at his visit today.   Today, he denies symptoms of chest pain, shortness of breath, orthopnea, PND, lower extremity edema, presyncope, syncope,  bleeding, or neurologic sequela. The patient is tolerating medications without difficulties and is otherwise without complaint today.    Atrial Fibrillation Risk Factors:  he does have symptoms or diagnosis of sleep apnea. he is not compliant with CPAP therapy. he does not have a history of rheumatic fever. He does have a family history of afib. Mother and father.  he has a BMI of Body mass index is 35.51 kg/m.Marland Kitchen Filed Weights   07/24/20 1342  Weight: 113.9 kg    Family History  Problem Relation Age of Onset  . Heart disease Mother 68       CABG age 56  . Breast cancer  Mother   . Tongue cancer Mother   . Diabetes Mother   . Heart attack Father 72       Father had around 7 heart attacks per pt  . Diabetes Father   . Lung cancer Father   . Congestive Heart Failure Father   . Lung cancer Paternal Uncle      Atrial Fibrillation Management history:  Previous antiarrhythmic drugs: Multaq, dofetilide, sotalol Previous cardioversions: 2017 x2, 03/21/20 Previous ablations: 2018, 03/01/20 CHADS2VASC score: 3 Anticoagulation history: Eliquis   Past Medical History:  Diagnosis Date  . Arthritis    "a little in my right knee" (07/14/2016)  . CAD (coronary artery disease)   . Chronic congestive heart failure with left ventricular diastolic dysfunction ([place])   . Complication of anesthesia    Pt reports "they have a hard time waking me up"  . GERD (gastroesophageal reflux disease)    hx  . Headache    "with every heart issue that I have" (07/14/2016)  . High cholesterol   . History of hiatal hernia 1990s   "fixed it w/scope down my throat"  . Hypertension   . Myocardial infarction [facility]) 05/2013   stents: left PDA, 1st OM at [facility]  . Obesity   . Persistent atrial fibrillation ([place])   . Pneumonia ~ 1992  . QT prolongation 07/15/2016  . Seasonal allergies    "I take Allegra prn" (07/14/2016)  . Tobacco abuse 12/16/2016   Past  Surgical History:  Procedure Laterality Date  . APPENDECTOMY  1984  . ATRIAL FIBRILLATION ABLATION N/A 03/01/2020   Procedure: ATRIAL FIBRILLATION ABLATION;  Surgeon: [name], MD;  Location: [place] CV LAB;  Service: Cardiovascular;  Laterality: N/A;  . CARDIOVERSION N/A 05/27/2016   Procedure: CARDIOVERSION;  Surgeon: [name], MD;  Location: [facility]COPY;  Service: Cardiovascular;  Laterality: N/A;  . CARDIOVERSION N/A 06/11/2016   Procedure: CARDIOVERSION;  Surgeon: [name], MD;  Location: [facility] ENDOSCOPY;  Service: Cardiovascular;  Laterality: N/A;  . CARDIOVERSION N/A 03/21/2020   Procedure:  CARDIOVERSION;  Surgeon: Geralynn Rile, MD;  Location: [place];  Service: Cardiovascular;  Laterality: N/A;  . CORONARY ANGIOPLASTY WITH STENT PLACEMENT  05/30/2013   "2 stents put in at [facility]" & /stent card  . CORONARY STENT INTERVENTION N/A 11/27/2017   Procedure: CORONARY STENT INTERVENTION;  Surgeon: [name], MD;  Location: [place] CV LAB;  Service: Cardiovascular;  Laterality: N/A;  . CORONARY STENT INTERVENTION N/A 03/21/2019   Procedure: CORONARY STENT INTERVENTION;  Surgeon: [name], MD;  Location: [place] CV LAB;  Service: Cardiovascular;  Laterality: N/A;  . ELECTROPHYSIOLOGIC STUDY N/A 11/13/2016   Procedure: Atrial Fibrillation Ablation;  Surgeon: [name], MD;  Location: [place] CV LAB;  Service: Cardiovascular;  Laterality: N/A;  . ESOPHAGOGASTRODUODENOSCOPY (EGD) WITH ESOPHAGEAL DILATION  1990s X 2  . INTRAVASCULAR PRESSURE WIRE/FFR STUDY N/A 09/21/2018   Procedure: INTRAVASCULAR PRESSURE WIRE/FFR STUDY;  Surgeon: [name], MD;  Location: [place] CV LAB;  Service: Cardiovascular;  Laterality: N/A;  . INTRAVASCULAR PRESSURE WIRE/FFR STUDY N/A 03/21/2019   Procedure: INTRAVASCULAR PRESSURE WIRE/FFR STUDY;  Surgeon: [name], MD;  Location: [place] CV LAB;  Service: Cardiovascular;  Laterality: N/A;  . INTRAVASCULAR PRESSURE WIRE/FFR STUDY N/A 01/24/2020   Procedure: INTRAVASCULAR PRESSURE WIRE/FFR STUDY;  Surgeon: [name], MD;  Location: [place] CV LAB;  Service: Cardiovascular;  Laterality: N/A;  . KNEE ARTHROSCOPY Right 1986; ~ 1995 X 2  . LAPAROSCOPIC CHOLECYSTECTOMY  2015  . LEFT HEART CATH AND CORONARY ANGIOGRAPHY N/A 11/27/2017   Procedure: LEFT HEART CATH AND CORONARY ANGIOGRAPHY;  Surgeon: [name], MD;  Location: [place] CV LAB;  Service: Cardiovascular;  Laterality: N/A;  . LEFT HEART CATH AND CORONARY ANGIOGRAPHY N/A 09/21/2018   Procedure: LEFT HEART CATH AND CORONARY ANGIOGRAPHY;  Surgeon: [name], MD;   Location: [place] CV LAB;  Service: Cardiovascular;  Laterality: N/A;  . LEFT HEART CATH AND CORONARY ANGIOGRAPHY N/A 03/21/2019   Procedure: LEFT HEART CATH AND CORONARY ANGIOGRAPHY;  Surgeon: [name], MD;  Location: [place] CV LAB;  Service: Cardiovascular;  Laterality: N/A;  . REPAIR OF ESOPHAGUS  1998   perforation of the distal esophagus from bad heartburn  . RIGHT/LEFT HEART CATH AND CORONARY ANGIOGRAPHY N/A 01/24/2020   Procedure: RIGHT/LEFT HEART CATH AND CORONARY ANGIOGRAPHY;  Surgeon: [name], MD;  Location: [place] CV LAB;  Service: Cardiovascular;  Laterality: N/A;  . TEE WITHOUT CARDIOVERSION N/A 11/13/2016   Procedure: TRANSESOPHAGEAL ECHOCARDIOGRAM (TEE);  Surgeon: Thayer Headings, MD;  Location: [facility] ENDOSCOPY;  Service: Cardiovascular;  Laterality: N/A;    Current Outpatient Medications  Medication Sig Dispense Refill  . acetaminophen (TYLENOL) 500 MG tablet Take 500 mg by mouth 2 (two) times daily as needed for moderate pain.    Marland Kitchen amLODipine (NORVASC) 10 MG tablet Take 1 tablet by mouth once daily 90 tablet 1  . apixaban (ELIQUIS) 5 MG TABS tablet Take 1  tablet (5 mg total) by mouth 2 (two) times daily. 60 tablet 3  . bisacodyl (DULCOLAX) 5 MG EC tablet Take 5 mg by mouth daily as needed.     . diltiazem (CARDIZEM) 30 MG tablet Take 1 tablet every 4 hours AS NEEDED for heart rate >110 45 tablet 1  . docusate sodium (PX DOCUSATE SODIUM) 100 MG capsule Take 100 mg by mouth 2 (two) times daily as needed.     . ezetimibe (ZETIA) 10 MG tablet Take 1 tablet (10 mg total) by mouth daily. 90 tablet 0  . fexofenadine (ALLEGRA) 180 MG tablet Take 180 mg by mouth daily as needed (seasonal allergies).     . hydrALAZINE (APRESOLINE) 50 MG tablet Take 1 tablet (50 mg total) by mouth 3 (three) times daily. 90 tablet 2  . metoprolol tartrate (LOPRESSOR) 100 MG tablet Take 1 tablet by mouth twice daily 180 tablet 1  . nitroGLYCERIN (NITROSTAT) 0.4 MG SL tablet Place 1 tablet (0.4  mg total) under the tongue every 5 (five) minutes as needed for chest pain. 25 tablet 0  . sacubitril-valsartan (ENTRESTO) 49-51 MG Take 1 tablet by mouth 2 (two) times daily. 60 tablet 3  . shark liver oil-cocoa butter (PREPARATION H) 0.25-3-85.5 % suppository Place 1 suppository rectally 2 (two) times daily as needed for hemorrhoids.    . torsemide (DEMADEX) 20 MG tablet Take 20 mg by mouth daily as needed (swelling).     . pantoprazole (PROTONIX) 40 MG tablet Take 1 tablet (40 mg total) by mouth daily. 45 tablet 0   No current facility-administered medications for this encounter.    Allergies  Allergen Reactions  . Codeine Anaphylaxis  . Crestor [Rosuvastatin] Other (See Comments)    Severe leg cramps  . Dilaudid [Hydromorphone] Itching and Swelling  . Isosorbide     Muscle pain and cramps  . Lexapro [Escitalopram]     Have bad thoughts made anxiety worse   . Lipitor [Atorvastatin Calcium] Other (See Comments)    myalgias  . Erythromycin Other (See Comments)    Hallucinations   . Oxycodone Itching and Rash    Swelling to face, hands, mouth Completley intolerant to any meds with oxycodone in it     Social History   Socioeconomic History  . Marital status: Married    Spouse name: Not on file  . Number of children: 2  . Years of education: Not on file  . Highest education level: Not on file  Occupational History  . Not on file  Tobacco Use  . Smoking status: Current Every Day Smoker    Years: 25.00    Types: Cigarettes, E-cigarettes    Last attempt to quit: 01/15/2020    Years since quitting: 0.5  . Smokeless tobacco: Never Used  . Tobacco comment: 5 cigarettes daily  Vaping Use  . Vaping Use: Never used  Substance and Sexual Activity  . Alcohol use: No  . Drug use: No  . Sexual activity: Yes  Other Topics Concern  . Not on file  Social History Narrative   Pt lives in [place] with spouse and 64 year old son.   disabled   Social Determinants of Health    Financial Resource Strain:   . Difficulty of Paying Living Expenses: Not on file  Food Insecurity:   . Worried About Charity fundraiser in the Last Year: Not on file  . Ran Out of Food in the Last Year: Not on file  Transportation Needs:   .  Lack of Transportation (Medical): Not on file  . Lack of Transportation (Non-Medical): Not on file  Physical Activity:   . Days of Exercise per Week: Not on file  . Minutes of Exercise per Session: Not on file  Stress:   . Feeling of Stress : Not on file  Social Connections:   . Frequency of Communication with Friends and Family: Not on file  . Frequency of Social Gatherings with Friends and Family: Not on file  . Attends Religious Services: Not on file  . Active Member of Clubs or Organizations: Not on file  . Attends Archivist Meetings: Not on file  . Marital Status: Not on file  Intimate Partner Violence:   . Fear of Current or Ex-Partner: Not on file  . Emotionally Abused: Not on file  . Physically Abused: Not on file  . Sexually Abused: Not on file     ROS- All systems are reviewed and negative except as per the HPI above.  Physical Exam: Vitals:   07/24/20 1342  BP: 120/88  Pulse: 61  Weight: 113.9 kg  Height: 5' 10.5" (1.791 m)    GEN- The patient is well appearing obese male, alert and oriented x 3 today.   HEENT-head normocephalic, atraumatic, sclera clear, conjunctiva pink, hearing intact, trachea midline. Lungs- Clear to ausculation bilaterally, normal work of breathing Heart- Regular rate and rhythm, no murmurs, rubs or gallops  GI- soft, NT, ND, + BS Extremities- no clubbing, cyanosis, or edema MS- no significant deformity or atrophy Skin- no rash or lesion Psych- euthymic mood, full affect Neuro- strength and sensation are intact   Wt Readings from Last 3 Encounters:  07/24/20 113.9 kg  06/04/20 113.4 kg  03/29/20 114.6 kg    EKG today demonstrates SR HR 61, NST, PR 160, QRS 96, QTc  436  Echo 12/28/19 demonstrated  Severely depressed LV systolic function with visual EF 25-30%. Severe  global hypokinesis Left ventricle cavity is normal in size. Moderate left  ventricular hypertrophy. Doppler evidence of grade II diastolic  dysfunction, elevated LAP. Calculated EF 29%.  No significant valvular abnormalities.  Left atrial cavity is mildly dilated.  Right ventricle cavity is normal in size but visually low normal right  ventricular function.  The aortic root is dilated, 4.0cm at the level of sinus of Valsalva.  Proximal ascending aorta not well visualized.  Prior study dated 11/27/2017: Normal LVEF. Mild LVH. Normal diastolic  parameters. Mild LA enlargement.  Epic records are reviewed at length today  CHA2DS2-VASc Score = 3  The patient's score is based upon: CHF History: 1 HTN History: 1 Age : 0 Diabetes History: 0 Stroke History: 0 Vascular Disease History: 1 Gender: 0      ASSESSMENT AND PLAN: 1. Persistent Atrial Fibrillation (ICD10:  I48.19) The patient's CHA2DS2-VASc score is 3, indicating a 3.2% annual risk of stroke.   S/p afib ablation 03/01/20 with Dr Rayann Heman. S/p DCCV on 03/21/20. Patient reporting frequent episodes of afib. Had been persistent for two weeks prior to visit today. Patient also having low heart rates as well.  ? Tachybradycardia. We discussed ILR to further assess afib burden as well as bradycardia. Patient agreeable to discussing with Dr Rayann Heman.  Continue Eliquis 5 mg BID Continue Lopressor 100 mg BID Continue diltiazem 30 mg PRN q 4hours for heart rate >110 bpm.   Would avoid class 1C with h/o CAD and he has previously failed sotalol and dofetilide 2/2 QT prolongation.   2. Secondary Hypercoagulable State (  ICD10:  D68.69) The patient is at significant risk for stroke/thromboembolism based upon his CHA2DS2-VASc Score of 3.  Continue Apixaban (Eliquis).   3. Obesity Body mass index is 35.51 kg/m. Lifestyle modification was  discussed and encouraged including regular physical activity and weight reduction.  4. Obstructive sleep apnea Patient denies h/o sleep apnea. Sleep study in 2018 shows moderate OSA with CPAP recommended.   5. HTN Stable, no changes today. Historically labile.   6. CAD No anginal symptoms.   7. NICM Echo shows EF 25-30%. Suspected 2/2 afib.   Follow up with Dr Rayann Heman.   [facility] [address] [place], [place] [address] [phone] 07/24/2020 2:12 PM

## 2020-07-27 ENCOUNTER — Other Ambulatory Visit: Payer: Self-pay

## 2020-07-27 DIAGNOSIS — I10 Essential (primary) hypertension: Secondary | ICD-10-CM

## 2020-07-27 MED ORDER — NITROGLYCERIN 0.4 MG SL SUBL: 0.4000 mg | SUBLINGUAL_TABLET | SUBLINGUAL | 0 refills | Status: DC | PRN

## 2020-07-27 MED ORDER — AMLODIPINE BESYLATE 10 MG PO TABS: 10.0000 mg | ORAL_TABLET | Freq: Every day | ORAL | 1 refills | Status: DC

## 2020-07-27 MED ORDER — EZETIMIBE 10 MG PO TABS
10.0000 mg | ORAL_TABLET | Freq: Every day | ORAL | 1 refills | Status: DC
Start: 2020-07-27 — End: 2020-12-06

## 2020-08-02 DIAGNOSIS — I10 Essential (primary) hypertension: Secondary | ICD-10-CM | POA: Diagnosis not present

## 2020-08-05 DIAGNOSIS — J069 Acute upper respiratory infection, unspecified: Secondary | ICD-10-CM | POA: Diagnosis not present

## 2020-08-05 DIAGNOSIS — R053 Chronic cough: Secondary | ICD-10-CM | POA: Diagnosis not present

## 2020-08-17 ENCOUNTER — Other Ambulatory Visit: Payer: Self-pay

## 2020-08-17 ENCOUNTER — Telehealth: Payer: Self-pay | Admitting: Radiology

## 2020-08-17 ENCOUNTER — Encounter: Payer: Self-pay | Admitting: Internal Medicine

## 2020-08-17 ENCOUNTER — Ambulatory Visit (INDEPENDENT_AMBULATORY_CARE_PROVIDER_SITE_OTHER): Payer: Medicare Other | Admitting: Internal Medicine

## 2020-08-17 VITALS — BP 124/94 | HR 75 | Ht 70.5 in | Wt 251.8 lb

## 2020-08-17 DIAGNOSIS — I25118 Atherosclerotic heart disease of native coronary artery with other forms of angina pectoris: Secondary | ICD-10-CM

## 2020-08-17 DIAGNOSIS — I428 Other cardiomyopathies: Secondary | ICD-10-CM | POA: Diagnosis not present

## 2020-08-17 DIAGNOSIS — I10 Essential (primary) hypertension: Secondary | ICD-10-CM | POA: Diagnosis not present

## 2020-08-17 DIAGNOSIS — I4819 Other persistent atrial fibrillation: Secondary | ICD-10-CM

## 2020-08-17 NOTE — Telephone Encounter (Signed)
Enrolled patient for a 14 day Zio XT  monitor to be mailed to patients home  °

## 2020-08-17 NOTE — Progress Notes (Signed)
PCP: [name], NP Primary Cardiologist: Dr Amanda Cockayne Primary EP: Dr Rosanne Ashing is a 54 y.o. male who presents today for routine electrophysiology followup.  Since last being seen in our clinic, the patient reports doing reasonably well.  He does have some palpitations.  He thinks that this is due to afib.  He is in sinus today and was in sinus at that afib clinic.  He is discouraged. Today, he denies symptoms of chest pain, shortness of breath,  lower extremity edema, dizziness, presyncope, or syncope.  The patient is otherwise without complaint today.   Past Medical History:  Diagnosis Date  . Arthritis    "a little in my right knee" (07/14/2016)  . CAD (coronary artery disease)   . Chronic congestive heart failure with left ventricular diastolic dysfunction ([place])   . Complication of anesthesia    Pt reports "they have a hard time waking me up"  . GERD (gastroesophageal reflux disease)    hx  . Headache    "with every heart issue that I have" (07/14/2016)  . High cholesterol   . History of hiatal hernia 1990s   "fixed it w/scope down my throat"  . Hypertension   . Myocardial infarction [facility]) 05/2013   stents: left PDA, 1st OM at [facility]  . Obesity   . Persistent atrial fibrillation ([place])   . Pneumonia ~ 1992  . QT prolongation 07/15/2016  . Seasonal allergies    "I take Allegra prn" (07/14/2016)  . Tobacco abuse 12/16/2016   Past Surgical History:  Procedure Laterality Date  . APPENDECTOMY  1984  . ATRIAL FIBRILLATION ABLATION N/A 03/01/2020   Procedure: ATRIAL FIBRILLATION ABLATION;  Surgeon: [name], MD;  Location: [place] CV LAB;  Service: Cardiovascular;  Laterality: N/A;  . CARDIOVERSION N/A 05/27/2016   Procedure: CARDIOVERSION;  Surgeon: [name], MD;  Location: [facility] ENDOSCOPY;  Service: Cardiovascular;  Laterality: N/A;  . CARDIOVERSION N/A 06/11/2016   Procedure: CARDIOVERSION;  Surgeon: [name], MD;  Location: [facility] ENDOSCOPY;  Service:  Cardiovascular;  Laterality: N/A;  . CARDIOVERSION N/A 03/21/2020   Procedure: CARDIOVERSION;  Surgeon: Geralynn Rile, MD;  Location: [place];  Service: Cardiovascular;  Laterality: N/A;  . CORONARY ANGIOPLASTY WITH STENT PLACEMENT  05/30/2013   "2 stents put in at [facility]" & /stent card  . CORONARY STENT INTERVENTION N/A 11/27/2017   Procedure: CORONARY STENT INTERVENTION;  Surgeon: [name], MD;  Location: [place] CV LAB;  Service: Cardiovascular;  Laterality: N/A;  . CORONARY STENT INTERVENTION N/A 03/21/2019   Procedure: CORONARY STENT INTERVENTION;  Surgeon: [name], MD;  Location: [place] CV LAB;  Service: Cardiovascular;  Laterality: N/A;  . ELECTROPHYSIOLOGIC STUDY N/A 11/13/2016   Procedure: Atrial Fibrillation Ablation;  Surgeon: [name], MD;  Location: [place] CV LAB;  Service: Cardiovascular;  Laterality: N/A;  . ESOPHAGOGASTRODUODENOSCOPY (EGD) WITH ESOPHAGEAL DILATION  1990s X 2  . INTRAVASCULAR PRESSURE WIRE/FFR STUDY N/A 09/21/2018   Procedure: INTRAVASCULAR PRESSURE WIRE/FFR STUDY;  Surgeon: [name], MD;  Location: [place] CV LAB;  Service: Cardiovascular;  Laterality: N/A;  . INTRAVASCULAR PRESSURE WIRE/FFR STUDY N/A 03/21/2019   Procedure: INTRAVASCULAR PRESSURE WIRE/FFR STUDY;  Surgeon: [name], MD;  Location: [place] CV LAB;  Service: Cardiovascular;  Laterality: N/A;  . INTRAVASCULAR PRESSURE WIRE/FFR STUDY N/A 01/24/2020   Procedure: INTRAVASCULAR PRESSURE WIRE/FFR STUDY;  Surgeon: [name], MD;  Location: [place] CV LAB;  Service: Cardiovascular;  Laterality: N/A;  .  KNEE ARTHROSCOPY Right 1986; ~ 1995 X 2  . LAPAROSCOPIC CHOLECYSTECTOMY  2015  . LEFT HEART CATH AND CORONARY ANGIOGRAPHY N/A 11/27/2017   Procedure: LEFT HEART CATH AND CORONARY ANGIOGRAPHY;  Surgeon: [name], MD;  Location: [place] CV LAB;  Service: Cardiovascular;  Laterality: N/A;  . LEFT HEART CATH AND CORONARY ANGIOGRAPHY N/A 09/21/2018    Procedure: LEFT HEART CATH AND CORONARY ANGIOGRAPHY;  Surgeon: [name], MD;  Location: [place] CV LAB;  Service: Cardiovascular;  Laterality: N/A;  . LEFT HEART CATH AND CORONARY ANGIOGRAPHY N/A 03/21/2019   Procedure: LEFT HEART CATH AND CORONARY ANGIOGRAPHY;  Surgeon: [name], MD;  Location: [place] CV LAB;  Service: Cardiovascular;  Laterality: N/A;  . REPAIR OF ESOPHAGUS  1998   perforation of the distal esophagus from bad heartburn  . RIGHT/LEFT HEART CATH AND CORONARY ANGIOGRAPHY N/A 01/24/2020   Procedure: RIGHT/LEFT HEART CATH AND CORONARY ANGIOGRAPHY;  Surgeon: [name], MD;  Location: [place] CV LAB;  Service: Cardiovascular;  Laterality: N/A;  . TEE WITHOUT CARDIOVERSION N/A 11/13/2016   Procedure: TRANSESOPHAGEAL ECHOCARDIOGRAM (TEE);  Surgeon: Thayer Headings, MD;  Location: [place];  Service: Cardiovascular;  Laterality: N/A;    ROS- all systems are reviewed and negatives except as per HPI above  Current Outpatient Medications  Medication Sig Dispense Refill  . acetaminophen (TYLENOL) 500 MG tablet Take 500 mg by mouth 2 (two) times daily as needed for moderate pain.    Marland Kitchen amLODipine (NORVASC) 10 MG tablet Take 1 tablet (10 mg total) by mouth daily. 90 tablet 1  . apixaban (ELIQUIS) 5 MG TABS tablet Take 1 tablet (5 mg total) by mouth 2 (two) times daily. 60 tablet 3  . bisacodyl (DULCOLAX) 5 MG EC tablet Take 5 mg by mouth daily as needed.     . diltiazem (CARDIZEM) 30 MG tablet Take 1 tablet every 4 hours AS NEEDED for heart rate >110 45 tablet 1  . docusate sodium (PX DOCUSATE SODIUM) 100 MG capsule Take 100 mg by mouth 2 (two) times daily as needed.     . ezetimibe (ZETIA) 10 MG tablet Take 1 tablet (10 mg total) by mouth daily. 90 tablet 1  . fexofenadine (ALLEGRA) 180 MG tablet Take 180 mg by mouth daily as needed (seasonal allergies).     . hydrALAZINE (APRESOLINE) 50 MG tablet Take 1 tablet (50 mg total) by mouth 3 (three) times daily. 90 tablet 2  .  metoprolol tartrate (LOPRESSOR) 100 MG tablet Take 1 tablet by mouth twice daily 180 tablet 1  . nitroGLYCERIN (NITROSTAT) 0.4 MG SL tablet Place 1 tablet (0.4 mg total) under the tongue every 5 (five) minutes as needed for chest pain. 25 tablet 0  . sacubitril-valsartan (ENTRESTO) 49-51 MG Take 1 tablet by mouth 2 (two) times daily. 60 tablet 3  . shark liver oil-cocoa butter (PREPARATION H) 0.25-3-85.5 % suppository Place 1 suppository rectally 2 (two) times daily as needed for hemorrhoids.    . torsemide (DEMADEX) 20 MG tablet Take 20 mg by mouth daily as needed (swelling).     . pantoprazole (PROTONIX) 40 MG tablet Take 1 tablet (40 mg total) by mouth daily. 45 tablet 0   No current facility-administered medications for this visit.    Physical Exam: Vitals:   08/17/20 0919  BP: (!) 124/94  Pulse: 75  SpO2: 96%  Weight: 251 lb 12.8 oz (114.2 kg)  Height: 5' 10.5" (1.791 m)    GEN- The patient is well  appearing, alert and oriented x 3 today.   Head- normocephalic, atraumatic Eyes-  Sclera clear, conjunctiva pink Ears- hearing intact Oropharynx- clear Lungs- Clear to ausculation bilaterally, normal work of breathing Heart- Regular rate and rhythm, no murmurs, rubs or gallops, PMI not laterally displaced GI- soft, NT, ND, + BS Extremities- no clubbing, cyanosis, or edema  Wt Readings from Last 3 Encounters:  08/17/20 251 lb 12.8 oz (114.2 kg)  07/24/20 251 lb (113.9 kg)  06/04/20 250 lb (113.4 kg)    EKG tracing ordered today is personally reviewed and shows sinus rhythm with PVCs  Assessment and Plan:  1. Persistent afib He has done very well s/p ablation 03/01/20 He has palpitations of unclear etiology.  I will place Zio monitor today.  I will defer ILR at this time given uncertainty about his EF (See below).  If no afib by Zio, we will repeat echo with Dr Einar Gip. chads2vasc score is 3.  He is on eliquis.  If he is still having AF on his ZIo, we will consider tikosyn vs  surgical ablation.  2. Nonischemic CM Repeat echo to further evaluate his EF post ablation in 3 months if Zio confirms sinus.  If EF > 35%, we would then consider ILR.  If EF remains depressed then further medicine optimization is advised.  3. 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 HTN Stable No change required today   Return in 6-8 weeks after Zio is resulted for further EP management.  [name] MD, [facility] 08/17/2020 9:30 AM

## 2020-08-17 NOTE — Patient Instructions (Addendum)
Medication Instructions:  Your physician recommends that you continue on your current medications as directed. Please refer to the Current Medication list given to you today.  *If you need a refill on your cardiac medications before your next appointment, please call your pharmacy*  Lab Work: None ordered.  If you have labs (blood work) drawn today and your tests are completely normal, you will receive your results only by: Marland Kitchen MyChart Message (if you have MyChart) OR . A paper copy in the mail If you have any lab test that is abnormal or we need to change your treatment, we will call you to review the results.  Testing/Procedures: None ordered.  Follow-Up: At [facility], you and your health needs are our priority.  As part of our continuing mission to provide you with exceptional heart care, we have created designated Provider Care Teams.  These Care Teams include your primary Cardiologist (physician) and Advanced Practice Providers (APPs -  Physician Assistants and Nurse Practitioners) who all work together to provide you with the care you need, when you need it.  We recommend signing up for the patient portal called "MyChart".  Sign up information is provided on this After Visit Summary.  MyChart is used to connect with patients for Virtual Visits (Telemedicine).  Patients are able to view lab/test results, encounter notes, upcoming appointments, etc.  Non-urgent messages can be sent to your provider as well.   To learn more about what you can do with MyChart, go to [email or web address].    Your next appointment:   Your physician wants you to follow-up in: 10/01/20 at 3:30 pm    Other Instructions: ZIO XT- Long Term Monitor Instructions   Your physician has requested you wear your ZIO patch monitor___14__days.   This is a single patch monitor.  [employer] supplies one patch monitor per enrollment.  Additional stickers are not available.   Please do not apply patch if you will  be having a Nuclear Stress Test, Echocardiogram, Cardiac CT, MRI, or Chest Xray during the time frame you would be wearing the monitor. The patch cannot be worn during these tests.  You cannot remove and re-apply the ZIO XT patch monitor.   Your ZIO patch monitor will be sent USPS Priority mail from [facility] directly to your home address. The monitor may also be mailed to a PO BOX if home delivery is not available.   It may take 3-5 days to receive your monitor after you have been enrolled.   Once you have received you monitor, please review enclosed instructions.  Your monitor has already been registered assigning a specific monitor serial # to you.   Applying the monitor   Shave hair from upper left chest.   Hold abrader disc by orange tab.  Rub abrader in 40 strokes over left upper chest as indicated in your monitor instructions.   Clean area with 4 enclosed alcohol pads .  Use all pads to assure are is cleaned thoroughly.  Let dry.   Apply patch as indicated in monitor instructions.  Patch will be place under collarbone on left side of chest with arrow pointing upward.   Rub patch adhesive wings for 2 minutes.Remove white label marked "1".  Remove white label marked "2".  Rub patch adhesive wings for 2 additional minutes.   While looking in a mirror, press and release button in center of patch.  A small green light will flash 3-4 times .  This will be your only indicator the  monitor has been turned on.     Do not shower for the first 24 hours.  You may shower after the first 24 hours.   Press button if you feel a symptom. You will hear a small click.  Record Date, Time and Symptom in the Patient Log Book.   When you are ready to remove patch, follow instructions on last 2 pages of Patient Log Book.  Stick patch monitor onto last page of Patient Log Book.   Place Patient Log Book in [place] box.  Use locking tab on box and tape box closed securely.  The [employer] has  [employer] on it.  Please place in mailbox as soon as possible.  Your physician should have your test results approximately 7 days after the monitor has been mailed back to [facility].   Call [place] at [phone] if you have questions regarding your ZIO XT patch monitor.  Call them immediately if you see an orange light blinking on your monitor.   If your monitor falls off in less than 4 days contact our Monitor department at [phone].  If your monitor becomes loose or falls off after 4 days call [employer] at [phone] for suggestions on securing your monitor.

## 2020-08-21 ENCOUNTER — Other Ambulatory Visit (INDEPENDENT_AMBULATORY_CARE_PROVIDER_SITE_OTHER): Payer: Medicare Other

## 2020-08-21 DIAGNOSIS — I4819 Other persistent atrial fibrillation: Secondary | ICD-10-CM

## 2020-08-21 DIAGNOSIS — I25118 Atherosclerotic heart disease of native coronary artery with other forms of angina pectoris: Secondary | ICD-10-CM

## 2020-08-21 DIAGNOSIS — I428 Other cardiomyopathies: Secondary | ICD-10-CM

## 2020-08-21 DIAGNOSIS — I10 Essential (primary) hypertension: Secondary | ICD-10-CM

## 2020-09-03 ENCOUNTER — Ambulatory Visit: Payer: Medicare Other | Admitting: Internal Medicine

## 2020-09-06 DIAGNOSIS — I4819 Other persistent atrial fibrillation: Secondary | ICD-10-CM | POA: Diagnosis not present

## 2020-10-01 ENCOUNTER — Other Ambulatory Visit: Payer: Self-pay

## 2020-10-01 ENCOUNTER — Ambulatory Visit (INDEPENDENT_AMBULATORY_CARE_PROVIDER_SITE_OTHER): Payer: Medicare Other | Admitting: Internal Medicine

## 2020-10-01 ENCOUNTER — Other Ambulatory Visit: Payer: Self-pay | Admitting: Cardiology

## 2020-10-01 ENCOUNTER — Telehealth: Payer: Self-pay | Admitting: Cardiology

## 2020-10-01 ENCOUNTER — Encounter: Payer: Self-pay | Admitting: Internal Medicine

## 2020-10-01 VITALS — BP 124/84 | HR 90 | Ht 70.5 in | Wt 252.0 lb

## 2020-10-01 DIAGNOSIS — I428 Other cardiomyopathies: Secondary | ICD-10-CM | POA: Diagnosis not present

## 2020-10-01 DIAGNOSIS — I25118 Atherosclerotic heart disease of native coronary artery with other forms of angina pectoris: Secondary | ICD-10-CM

## 2020-10-01 DIAGNOSIS — I480 Paroxysmal atrial fibrillation: Secondary | ICD-10-CM

## 2020-10-01 DIAGNOSIS — I5042 Chronic combined systolic (congestive) and diastolic (congestive) heart failure: Secondary | ICD-10-CM

## 2020-10-01 DIAGNOSIS — I10 Essential (primary) hypertension: Secondary | ICD-10-CM

## 2020-10-01 DIAGNOSIS — Z9889 Other specified postprocedural states: Secondary | ICD-10-CM

## 2020-10-01 DIAGNOSIS — I4819 Other persistent atrial fibrillation: Secondary | ICD-10-CM

## 2020-10-01 NOTE — Patient Instructions (Addendum)
Medication Instructions:  Your physician recommends that you continue on your current medications as directed. Please refer to the Current Medication list given to you today.  *If you need a refill on your cardiac medications before your next appointment, please call your pharmacy*  Lab Work: None ordered.  If you have labs (blood work) drawn today and your tests are completely normal, you will receive your results only by: Marland Kitchen MyChart Message (if you have MyChart) OR . A paper copy in the mail If you have any lab test that is abnormal or we need to change your treatment, we will call you to review the results.  Testing/Procedures: None ordered.  Follow-Up: At [facility], you and your health needs are our priority.  As part of our continuing mission to provide you with exceptional heart care, we have created designated Provider Care Teams.  These Care Teams include your primary Cardiologist (physician) and Advanced Practice Providers (APPs -  Physician Assistants and Nurse Practitioners) who all work together to provide you with the care you need, when you need it.  We recommend signing up for the patient portal called "MyChart".  Sign up information is provided on this After Visit Summary.  MyChart is used to connect with patients for Virtual Visits (Telemedicine).  Patients are able to view lab/test results, encounter notes, upcoming appointments, etc.  Non-urgent messages can be sent to your provider as well.   To learn more about what you can do with MyChart, go to [email or web address].    Your next appointment:   Your physician wants you to follow-up in: 01/02/21 at 11:45 with Dr. Rayann Heman.   Other Instructions:

## 2020-10-01 NOTE — Progress Notes (Signed)
PCP: [name], NP Primary Cardiologist: Dr Einar Gip Primary EP: Dr Rosanne Ashing is a 54 y.o. male who presents today for routine electrophysiology followup.  Since last being seen in our clinic, the patient reports doing reasonably well.  His primary concern has been with elevated BP.  His exercise tolerance is also limited.  Today, he denies symptoms of palpitations, chest pain, shortness of breath,  lower extremity edema, dizziness, presyncope, or syncope.  The patient is otherwise without complaint today.   Past Medical History:  Diagnosis Date  . Arthritis    "a little in my right knee" (07/14/2016)  . CAD (coronary artery disease)   . 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 Complication of anesthesia    Pt reports "they have a hard time waking me up"  . GERD (gastroesophageal reflux disease)    hx  . Headache    "with every heart issue that I have" (07/14/2016)  . High cholesterol   . History of hiatal hernia 1990s   "fixed it w/scope down my throat"  . Hypertension   . Myocardial infarction [facility]) 05/2013   stents: left PDA, 1st OM at [facility]  . Obesity   . Persistent atrial fibrillation ([place])   . Pneumonia ~ 1992  . QT prolongation 07/15/2016  . Seasonal allergies    "I take Allegra prn" (07/14/2016)  . Tobacco abuse 12/16/2016   Past Surgical History:  Procedure Laterality Date  . APPENDECTOMY  1984  . ATRIAL FIBRILLATION ABLATION N/A 03/01/2020   Procedure: ATRIAL FIBRILLATION ABLATION;  Surgeon: [name], MD;  Location: [place] CV LAB;  Service: Cardiovascular;  Laterality: N/A;  . CARDIOVERSION N/A 05/27/2016   Procedure: CARDIOVERSION;  Surgeon: [name], MD;  Location: [facility] ENDOSCOPY;  Service: Cardiovascular;  Laterality: N/A;  . CARDIOVERSION N/A 06/11/2016   Procedure: CARDIOVERSION;  Surgeon: [name], MD;  Location: [facility] ENDOSCOPY;  Service: Cardiovascular;  Laterality: N/A;  .  CARDIOVERSION N/A 03/21/2020   Procedure: CARDIOVERSION;  Surgeon: Geralynn Rile, MD;  Location: [place];  Service: Cardiovascular;  Laterality: N/A;  . CORONARY ANGIOPLASTY WITH STENT PLACEMENT  05/30/2013   "2 stents put in at [facility]" & /stent card  . CORONARY STENT INTERVENTION N/A 11/27/2017   Procedure: CORONARY STENT INTERVENTION;  Surgeon: [name], MD;  Location: [place] CV LAB;  Service: Cardiovascular;  Laterality: N/A;  . CORONARY STENT INTERVENTION N/A 03/21/2019   Procedure: CORONARY STENT INTERVENTION;  Surgeon: [name], MD;  Location: [place] CV LAB;  Service: Cardiovascular;  Laterality: N/A;  . ELECTROPHYSIOLOGIC STUDY N/A 11/13/2016   Procedure: Atrial Fibrillation Ablation;  Surgeon: [name], MD;  Location: [place] CV LAB;  Service: Cardiovascular;  Laterality: N/A;  . ESOPHAGOGASTRODUODENOSCOPY (EGD) WITH ESOPHAGEAL DILATION  1990s X 2  . INTRAVASCULAR PRESSURE WIRE/FFR STUDY N/A 09/21/2018   Procedure: INTRAVASCULAR PRESSURE WIRE/FFR STUDY;  Surgeon: [name], MD;  Location: [place] CV LAB;  Service: Cardiovascular;  Laterality: N/A;  . INTRAVASCULAR PRESSURE WIRE/FFR STUDY N/A 03/21/2019   Procedure: INTRAVASCULAR PRESSURE WIRE/FFR STUDY;  Surgeon: [name], MD;  Location: [place] CV LAB;  Service: Cardiovascular;  Laterality: N/A;  . INTRAVASCULAR PRESSURE WIRE/FFR STUDY N/A 01/24/2020   Procedure: INTRAVASCULAR PRESSURE WIRE/FFR STUDY;  Surgeon: [name], MD;  Location: [place] CV LAB;  Service: Cardiovascular;  Laterality: N/A;  . KNEE ARTHROSCOPY Right 1986; ~ 1995 X 2  . LAPAROSCOPIC CHOLECYSTECTOMY  2015  . LEFT HEART CATH AND CORONARY ANGIOGRAPHY N/A 11/27/2017   Procedure: LEFT HEART CATH AND CORONARY ANGIOGRAPHY;  Surgeon: [name], MD;  Location: [place] CV LAB;  Service: Cardiovascular;  Laterality: N/A;  . LEFT HEART CATH AND CORONARY ANGIOGRAPHY N/A 09/21/2018   Procedure: LEFT HEART CATH AND CORONARY  ANGIOGRAPHY;  Surgeon: [name], MD;  Location: [place] CV LAB;  Service: Cardiovascular;  Laterality: N/A;  . LEFT HEART CATH AND CORONARY ANGIOGRAPHY N/A 03/21/2019   Procedure: LEFT HEART CATH AND CORONARY ANGIOGRAPHY;  Surgeon: [name], MD;  Location: [place] CV LAB;  Service: Cardiovascular;  Laterality: N/A;  . REPAIR OF ESOPHAGUS  1998   perforation of the distal esophagus from bad heartburn  . RIGHT/LEFT HEART CATH AND CORONARY ANGIOGRAPHY N/A 01/24/2020   Procedure: RIGHT/LEFT HEART CATH AND CORONARY ANGIOGRAPHY;  Surgeon: [name], MD;  Location: [place] CV LAB;  Service: Cardiovascular;  Laterality: N/A;  . TEE WITHOUT CARDIOVERSION N/A 11/13/2016   Procedure: TRANSESOPHAGEAL ECHOCARDIOGRAM (TEE);  Surgeon: Thayer Headings, MD;  Location: [place];  Service: Cardiovascular;  Laterality: N/A;    ROS- all systems are reviewed and negatives except as per HPI above  Current Outpatient Medications  Medication Sig Dispense Refill  . acetaminophen (TYLENOL) 500 MG tablet Take 500 mg by mouth 2 (two) times daily as needed for moderate pain.    Marland Kitchen amLODipine (NORVASC) 10 MG tablet Take 1 tablet (10 mg total) by mouth daily. 90 tablet 1  . apixaban (ELIQUIS) 5 MG TABS tablet Take 1 tablet (5 mg total) by mouth 2 (two) times daily. 60 tablet 3  . bisacodyl (DULCOLAX) 5 MG EC tablet Take 5 mg by mouth daily as needed.     . diltiazem (CARDIZEM) 30 MG tablet Take 1 tablet every 4 hours AS NEEDED for heart rate >110 45 tablet 1  . docusate sodium (PX DOCUSATE SODIUM) 100 MG capsule Take 100 mg by mouth 2 (two) times daily as needed.     . ezetimibe (ZETIA) 10 MG tablet Take 1 tablet (10 mg total) by mouth daily. 90 tablet 1  . fexofenadine (ALLEGRA) 180 MG tablet Take 180 mg by mouth daily as needed (seasonal allergies).     . metoprolol tartrate (LOPRESSOR) 100 MG tablet Take 1 tablet by mouth twice daily 180 tablet 1  . nitroGLYCERIN (NITROSTAT) 0.4 MG SL tablet Place 1  tablet (0.4 mg total) under the tongue every 5 (five) minutes as needed for chest pain. 25 tablet 0  . sacubitril-valsartan (ENTRESTO) 49-51 MG Take 1 tablet by mouth 2 (two) times daily. 60 tablet 3  . shark liver oil-cocoa butter (PREPARATION H) 0.25-3-85.5 % suppository Place 1 suppository rectally 2 (two) times daily as needed for hemorrhoids.    . torsemide (DEMADEX) 20 MG tablet Take 20 mg by mouth daily as needed (swelling).     . hydrALAZINE (APRESOLINE) 50 MG tablet Take 1 tablet (50 mg total) by mouth 3 (three) times daily. 90 tablet 2  . pantoprazole (PROTONIX) 40 MG tablet Take 1 tablet (40 mg total) by mouth daily. 45 tablet 0   No current facility-administered medications for this visit.    Physical Exam: Vitals:   10/01/20 1545  BP: 124/84  Pulse: 90  SpO2: 95%  Weight: 252 lb (114.3 kg)  Height: 5' 10.5" (1.791 m)    GEN- The patient is well appearing, alert and oriented x 3 today.   Head- normocephalic, atraumatic Eyes-  Sclera clear,  conjunctiva pink Ears- hearing intact Oropharynx- clear Lungs- Clear to ausculation bilaterally, normal work of breathing Heart- Regular rate and rhythm with ectopy GI- soft, NT, ND, + BS Extremities- no clubbing, cyanosis, or edema  Wt Readings from Last 3 Encounters:  10/01/20 252 lb (114.3 kg)  08/17/20 251 lb 12.8 oz (114.2 kg)  07/24/20 251 lb (113.9 kg)    EKG tracing ordered today is personally reviewed and shows sinus with outflow tract PVCs  Assessment and Plan:  1. Persistent afib Recently had Zio monitor placed.  This recorded only sinus with PACs/PVCs. No afib. Repeat echo with Dr Einar Gip  2. Nonischemic CM Likely tachycardia mediated Will ask Dr Einar Gip to repeat an echo Hopefully his EF has improved  3. PVCs Chronic and stable No further workup planned  4. HTN Dr Einar Gip and I discussed,  He is following closely   Risks, benefits and potential toxicities for medications prescribed and/or refilled reviewed  with patient today.   Return to see me in 3 months  [name] MD, [facility] 10/01/2020 4:06 PM

## 2020-10-01 NOTE — Telephone Encounter (Signed)
Patient was complaining of fatigue and palpitations, outpatient extended EKG monitoring by Dr. Rayann Heman revealed normal sinus rhythm with occasional PACs and PVCs.  He appears to be holding sinus rhythm since atrial fibrillation ablation.  Patient has developed LV systolic dysfunction, now that he is maintaining sinus rhythm Dr. Rayann Heman and myself would like to repeat echocardiogram to recheck his LVEF.  Orders were placed.    ICD-10-CM   1. Chronic combined systolic and diastolic heart failure (HCC)  I50.42 PCV ECHOCARDIOGRAM COMPLETE  2. Paroxysmal atrial fibrillation ([place]). CHA2DS2-VASc Score is 5. Yearly risk of stroke: 6.7% (HTN, Vasc Dz, CHF, TIA).  I48.0 PCV ECHOCARDIOGRAM COMPLETE  3. S/P ablation of atrial fibrillation  Z98.890    Z86.79      [name], MD, [facility] 10/01/2020, 4:20 PM Office: [phone] Pager: [phone]

## 2020-10-02 DIAGNOSIS — I10 Essential (primary) hypertension: Secondary | ICD-10-CM | POA: Diagnosis not present

## 2020-10-11 DIAGNOSIS — L509 Urticaria, unspecified: Secondary | ICD-10-CM | POA: Diagnosis not present

## 2020-10-30 ENCOUNTER — Other Ambulatory Visit: Payer: Self-pay | Admitting: Cardiology

## 2020-11-05 ENCOUNTER — Ambulatory Visit: Payer: Medicare Other | Admitting: Cardiology

## 2020-11-07 NOTE — Progress Notes (Signed)
Primary Physician/Referring:  [name], NP  Patient ID: Danny Kelley, male    DOB: 03/19/1966, 55 y.o.   MRN: [id number]  Chief Complaint  Patient presents with   combined systolic and diastolic heart failure    Follow-up   HPI:    Danny Kelley  is a 55 y.o. Caucasian male patient with history of cardiomyopathy with chronic systolic heart failure, hypertension, hyperlipidemia (statin intolerant), obstructive sleep apnea compliant with CPAP, tobacco use.  Patient has history of TIA in Feb 2018 while on anticoagulants, paroxysmal atrial fibrillation status post ablation 11/13/2016 and 03/01/2020 (Multaq and sotalol ineffective).  History of unstable angina pectoris on 11/26/2017, angioplasty to superdominant left circumflex coronary artery with 4.5 mm resolute DES in the midsegment, patent stent placed in Distal Cx and OM 1 in 2014, 03/22/2019, stenting to high-grade stenosis in the distal dominant Circumflex PDA branch. Cardiac catheterization on 01/24/2020 revealed no change in coronary anatomy, suspect his cardiomyopathy may have been related to underlying A. Fib.  Of note patient has previously been evaluated for syncope, which was felt to be psychogenic in view of his underlying history of seizure disorder.  Patient is enrolled in remote patient monitoring for hypertension.  Patient has been unable to tolerate statin therapy in the past due to severe myalgias including atorvastatin, rosuvastatin, and simvastatin.  Patient was last seen in our office 02/06/2020 by Dr. Einar Gip and advised at that time to follow up in 2-3 weeks, he now presents for follow-up.  Since last visit patient underwent atrial fibrillation ablation on 03/01/2020 by Dr. [name].  Follow-up cardiac monitoring revealed sinus rhythm without recurrence of atrial fibrillation.  Patient seems to be maintaining normal sinus rhythm, therefore repeat echocardiogram to reevaluate LVEF was obtained.  Patient reports he has had  no known recurrence of atrial fibrillation since ablation in April 2021.  He is currently walking approximately 2 miles daily, but does report mild worsening of dyspnea on exertion.  Patient has history of chronic orthopnea, which had improved for several months, but has returned over the last several weeks.  Of note patient has not smoked in 4 months.  He has taken sublingual nitroglycerin once in the last 2 months.  Patient complains of episodes of bradycardia according home monitoring associated with dizziness and fatigue.  Patient states his heart rate during these episodes tends to be in the 40 bpm range and this occurs approximately twice per week.  Past Medical History:  Diagnosis Date   Arthritis    "a little in my right knee" (07/14/2016)   CAD (coronary artery disease)    Chronic congestive heart failure with left ventricular diastolic dysfunction (HCC)    Complication of anesthesia    Pt reports "they have a hard time waking me up"   GERD (gastroesophageal reflux disease)    hx   Headache    "with every heart issue that I have" (07/14/2016)   High cholesterol    History of hiatal hernia 1990s   "fixed it w/scope down my throat"   Hypertension    Myocardial infarction ([place]) 05/2013   stents: left PDA, [facility]st OM at [facility]   Obesity    Persistent atrial fibrillation ([place])    Pneumonia ~ 1992   QT prolongation 07/15/2016   Seasonal allergies    "I take Allegra prn" (07/14/2016)   Tobacco abuse 12/16/2016   Past Surgical History:  Procedure Laterality Date   [place]  ABLATION N/A 03/01/2020   Procedure: ATRIAL FIBRILLATION ABLATION;  Surgeon: [name], MD;  Location: [place] [facility];  Service: Cardiovascular;  Laterality: N/A;   CARDIOVERSION N/A 05/27/2016   Procedure: CARDIOVERSION;  Surgeon: [name], MD;  Location: [facility];  Service: Cardiovascular;  Laterality: N/A;   CARDIOVERSION N/A 06/11/2016    Procedure: CARDIOVERSION;  Surgeon: [name], MD;  Location: [place];  Service: Cardiovascular;  Laterality: N/A;   CARDIOVERSION N/A 03/21/2020   Procedure: CARDIOVERSION;  Surgeon: Geralynn Rile, MD;  Location: [place];  Service: Cardiovascular;  Laterality: N/A;   CORONARY ANGIOPLASTY WITH STENT PLACEMENT  05/30/2013   "2 stents put in at [facility]" & /stent card   CORONARY STENT INTERVENTION N/A 11/27/2017   Procedure: CORONARY STENT INTERVENTION;  Surgeon: [name], MD;  Location: [place] [facility];  Service: Cardiovascular;  Laterality: N/A;   CORONARY STENT INTERVENTION N/A 03/21/2019   Procedure: CORONARY STENT INTERVENTION;  Surgeon: [name], MD;  Location: [place] [facility];  Service: Cardiovascular;  Laterality: N/A;   ELECTROPHYSIOLOGIC STUDY N/A 11/13/2016   Procedure: Atrial Fibrillation Ablation;  Surgeon: [name], MD;  Location: [place] [facility];  Service: Cardiovascular;  Laterality: N/A;   ESOPHAGOGASTRODUODENOSCOPY (EGD) WITH ESOPHAGEAL DILATION  1990s X 2   INTRAVASCULAR PRESSURE WIRE/FFR STUDY N/A 09/21/2018   Procedure: INTRAVASCULAR PRESSURE WIRE/FFR STUDY;  Surgeon: [name], MD;  Location: [place] [facility];  Service: Cardiovascular;  Laterality: N/A;   INTRAVASCULAR PRESSURE WIRE/FFR STUDY N/A 03/21/2019   Procedure: INTRAVASCULAR PRESSURE WIRE/FFR STUDY;  Surgeon: [name], MD;  Location: [place] [facility];  Service: Cardiovascular;  Laterality: N/A;   INTRAVASCULAR PRESSURE WIRE/FFR STUDY N/A 01/24/2020   Procedure: INTRAVASCULAR PRESSURE WIRE/FFR STUDY;  Surgeon: [name], MD;  Location: [place] [facility];  Service: Cardiovascular;  Laterality: N/A;   KNEE ARTHROSCOPY Right 1986; ~ 1995 X [place]  2015   LEFT HEART CATH AND CORONARY ANGIOGRAPHY N/A 11/27/2017   Procedure: LEFT HEART CATH AND CORONARY ANGIOGRAPHY;  Surgeon: [name], MD;  Location: [place] [facility];  Service: Cardiovascular;   Laterality: N/A;   LEFT HEART CATH AND CORONARY ANGIOGRAPHY N/A 09/21/2018   Procedure: LEFT HEART CATH AND CORONARY ANGIOGRAPHY;  Surgeon: [name], MD;  Location: [place] [facility];  Service: Cardiovascular;  Laterality: N/A;   LEFT HEART CATH AND CORONARY ANGIOGRAPHY N/A 03/21/2019   Procedure: LEFT HEART CATH AND CORONARY ANGIOGRAPHY;  Surgeon: [name], MD;  Location: [place] [facility];  Service: Cardiovascular;  Laterality: N/A;   REPAIR OF ESOPHAGUS  1998   perforation of the distal esophagus from bad heartburn   RIGHT/LEFT HEART CATH AND CORONARY ANGIOGRAPHY N/A 01/24/2020   Procedure: RIGHT/LEFT HEART CATH AND CORONARY ANGIOGRAPHY;  Surgeon: [name], MD;  Location: [place] [facility];  Service: Cardiovascular;  Laterality: N/A;   TEE WITHOUT CARDIOVERSION N/A 11/13/2016   Procedure: TRANSESOPHAGEAL ECHOCARDIOGRAM (TEE);  Surgeon: Thayer Headings, MD;  Location: [facility] ENDOSCOPY;  Service: Cardiovascular;  Laterality: N/A;   Family History  Problem Relation Age of Onset   Heart disease Mother 56       CABG age 35   Breast cancer Mother    Tongue cancer Mother    Diabetes Mother    Heart attack Father 25       Father had around 7 heart attacks per pt   Diabetes Father    Lung cancer Father    Congestive  Heart Failure Father    Lung cancer Paternal Uncle     Social History   Tobacco Use   Smoking status: Current Every Day Smoker    Years: 25.00    Types: Cigarettes, E-cigarettes    Last attempt to quit: 01/15/2020    Years since quitting: 0.8   Smokeless tobacco: Never Used   Tobacco comment: 5 cigarettes daily  Substance Use Topics   Alcohol use: No   ROS  Review of Systems  Constitutional: Positive for malaise/fatigue and weight gain. Negative for decreased appetite and weight loss.  Cardiovascular: Positive for dyspnea on exertion. Negative for chest pain, claudication, leg swelling, near-syncope, orthopnea, palpitations, paroxysmal nocturnal  dyspnea and syncope.  Respiratory: Negative for shortness of breath.   Hematologic/Lymphatic: Does not bruise/bleed easily.  Gastrointestinal: Negative for abdominal pain and melena.  Neurological: Positive for dizziness. Negative for headaches and weakness.  Psychiatric/Behavioral: Negative for suicidal ideas.  All other systems reviewed and are negative.  Objective  Blood pressure (!) 148/93, pulse 80, height 5' 10.5" (1.791 m), weight 252 lb (114.3 kg), SpO2 96 %.  Vitals with BMI 11/08/2020 10/01/2020 08/17/2020  Height 5' 10.5" 5' 10.5" 5' 10.5"  Weight 252 lbs 252 lbs 251 lbs 13 oz  BMI 35.64 [id number]  Systolic [id number] [id number]  Diastolic 93 84 94  Pulse 80 90 75     Physical Exam Vitals reviewed.  Constitutional:      Comments: Moderately obese  HENT:     Head: Normocephalic and atraumatic.  Cardiovascular:     Rate and Rhythm: Normal rate and regular rhythm.     Pulses: Intact distal pulses.     Heart sounds: Normal heart sounds, S1 normal and S2 normal. No murmur heard. No gallop.      Comments: No leg edema, no JVD. Pulmonary:     Effort: Pulmonary effort is normal. No accessory muscle usage or respiratory distress.     Breath sounds: Normal breath sounds. No wheezing, rhonchi or rales.  Abdominal:     General: Bowel sounds are normal.     Palpations: Abdomen is soft.  Musculoskeletal:        General: Normal range of motion.     Right lower leg: No edema.     Left lower leg: No edema.  Neurological:     Mental Status: He is alert.    Laboratory examination:   Recent Labs    01/02/20 1445 01/24/20 1500 01/24/20 1523 02/01/20 1301 03/12/20 1512  NA 139   < > 139 137 141  K 4.4   < > 3.6 3.8 4.0  CL 98  --   --  105 108  CO2 26  --   --  23 25  GLUCOSE 151*  --   --  138* 232*  BUN 12  --   --  12 17  CREATININE 0.88  --   --  0.79 0.95  CALCIUM 9.4  --   --  8.8* 8.9  GFRNONAA 99  --   --  >60 >60  GFRAA 115  --   --  >60 >60   < > = values in this  interval not displayed.   CrCl cannot be calculated (Patient's most recent lab result is older than the maximum 21 days allowed.).  CMP Latest Ref Rng & Units 03/12/2020 02/01/2020 01/24/2020  Glucose 70 - 99 mg/dL 232(H) 138(H) -  BUN 6 - 20 mg/dL 17 12 -  Creatinine 0.61 -  1.24 mg/dL 2.83 6.62 -  Sodium 947 - 145 mmol/L 141 137 139  Potassium 3.5 - 5.1 mmol/L 4.0 3.8 3.6  Chloride 98 - 111 mmol/L 108 105 -  CO2 22 - 32 mmol/L 25 23 -  Calcium 8.9 - 10.3 mg/dL 8.9 6.5(Y) -  Total Protein 6.5 - 8.1 g/dL - 7.4 -  Total Bilirubin 0.3 - 1.2 mg/dL - 0.4 -  Alkaline Phos 38 - 126 U/L - 140(H) -  AST 15 - 41 U/L - 18 -  ALT 0 - 44 U/L - 27 -   CBC Latest Ref Rng & Units 03/12/2020 02/01/2020 01/24/2020  WBC 4.0 - 10.5 K/uL 11.1(H) 9.6 -  Hemoglobin 13.0 - 17.0 g/dL 65.0 17.6(H) 15.6  Hematocrit 39.0 - 52.0 % 49.7 51.5 46.0  Platelets 150 - 400 K/uL 254 259 -   Lipid Panel     Component Value Date/Time   CHOL 239 (H) 01/02/2020 1445   TRIG 143 01/02/2020 1445   HDL 62 01/02/2020 1445   CHOLHDL 5.0 11/27/2017 0634   VLDL 16 11/27/2017 0634   LDLCALC 152 (H) 01/02/2020 1445   HEMOGLOBIN A1C Lab Results  Component Value Date   HGBA1C 6.0 (H) 11/27/2017   MPG 125.5 11/27/2017   TSH Recent Labs    01/02/20 1445  TSH 2.080   Medications and allergies   Allergies  Allergen Reactions   Codeine Anaphylaxis   Crestor [Rosuvastatin] Other (See Comments)    Severe leg cramps   Dilaudid [Hydromorphone] Itching and Swelling   Isosorbide     Muscle pain and cramps   Lexapro [Escitalopram]     Have bad thoughts made anxiety worse    Lipitor [Atorvastatin Calcium] Other (See Comments)    myalgias   Erythromycin Other (See Comments)    Hallucinations    Oxycodone Itching and Rash    Swelling to face, hands, mouth Completley intolerant to any meds with oxycodone in it      Current Outpatient Medications  Medication Instructions   acetaminophen (TYLENOL) 500 mg, Oral, 2  times daily PRN   amLODipine (NORVASC) 10 mg, Oral, Daily   apixaban (ELIQUIS) 5 mg, Oral, 2 times daily   Bempedoic Acid 180 mg, Oral, Daily   bisacodyl (DULCOLAX) 5 mg, Oral, Daily PRN   diltiazem (CARDIZEM) 30 MG tablet Take 1 tablet every 4 hours AS NEEDED for heart rate >110   docusate sodium (COLACE) 100 mg, Oral, 2 times daily PRN   ezetimibe (ZETIA) 10 mg, Oral, Daily   famotidine (PEPCID) 40 mg, Oral, As needed   fexofenadine (ALLEGRA) 180 mg, Oral, Daily PRN   hydrALAZINE (APRESOLINE) 50 mg, Oral, 3 times daily   metoprolol tartrate (LOPRESSOR) 100 MG tablet Take 1 tablet by mouth twice daily   nitroGLYCERIN (NITROSTAT) 0.4 MG SL tablet DISSOLVE ONE TABLET UNDER THE TONGUE EVERY 5 MINUTES AS NEEDED FOR CHEST PAIN.   pantoprazole (PROTONIX) 40 mg, Oral, Daily   sacubitril-valsartan (ENTRESTO) 97-103 MG 1 tablet, Oral, 2 times daily   shark liver oil-cocoa butter (PREPARATION H) 0.25-3-85.5 % suppository 1 suppository, Rectal, 2 times daily PRN   torsemide (DEMADEX) 20 mg, Oral, Daily PRN    Radiology:   No results found.  Cardiac Studies:    14-day cardiac event monitor 08/21/2020: Sinus rhythm Premature atrial contractions and premature ventricular contractions are noted Rare nonsustained ventricular tachycardia No sustained arrhythmias  Sleep study X 2 negative   Event Monitor 30 days 07/28/2018: Paroxysmal episodes of atrial fibrillation  with controlled ventricular response was noted along with PVCs. Some of this symptoms of chest pain, palpitations and dizziness correlated with atrial fibrillation, others showed normal sinus rhythm. No heart block, no other significant arrhythmias.  Direct current cardioversion ( Ist 05/27/16) 06/11/2016: 150x2, 200 x1 to NSR on Sotalol. A. Fib ablation by Dr. [name] on 11/13/2016  Echocardiogram 12/28/2019:  Severely depressed LV systolic function with visual EF 25-30%. Severe  global hypokinesis Left  ventricle cavity is normal in size. Moderate left  ventricular hypertrophy. Doppler evidence of grade II diastolic  dysfunction, elevated LAP. Calculated EF 29%.  No significant valvular abnormalities.  Left atrial cavity is mildly dilated.  Right ventricle cavity is normal in size but visually low normal right  ventricular function.  The aortic root is dilated, 4.0cm at the level of sinus of Valsalva.  Proximal ascending aorta not well visualized.  Prior study dated 11/27/2017: Normal LVEF. Mild LVH. Normal diastolic  parameters. Mild LA enlargement.  Left and right heart catheterization 01/24/2020: Right heart: RA 18/18, mean 17; RV 44/10, EDP 15; PA 48/19, mean 33.  PA saturation 72%.  PW 25/26, mean 23 mmHg.  Aortic saturation 98%. Cardiac output 4.73, cardiac index 2.09, reduced.  QP/QS 1.00. Impression: Mild decrease in cardiac output and cardiac index.  Mild to moderate pulmonary hypertension related to elevated LVEDP.  Left heart catheterization: LV pressure 139/17, EDP 31 mmHg.  Aortic pressure 144/97, mean 122 mmHg.  No pressure gradient. Right coronary artery is nondominant but a large vessel with a proximal 70% stenosis unchanged from cardiac catheterization on 03/21/2019. Left main is large.  It trifurcates. Circumflex: Dominant vessel.  Previously placed stent in the proximal to mid circumflex, OM1 and also PDA widely patent. Ramus intermediate: Large vessel, mild disease. LAD: Large vessel, diffuse 60 to 70% in some views appears to be almost 80% proximal long segment stenosis.  DFR 0.91, FFR 0.89,  not hemodynamically significant.  Lesion slightly progressed compared to prior angiography in 2020.  Overall impression: Nonischemic cardiomyopathy, probably related to underlying atrial fibrillation and hypertensive heart disease.  No significant change in coronary anatomy, has moderate underlying disease in the LAD but not hemodynamically significant and previously placed stent in  the circumflex widely patent (Circumflex stent placed 11/27/2017, 4.5 x 18 mm resolute widely patent, patent 3.5 x 18 mm Xience DES to dominant distal left circumflex, 2.5 x 12 mm Xience to OM1 placed 05/30/2013.)    Recommendation: Would consider atrial fibrillation ablation to evaluate and see whether his LVEF would improve.  Aggressive medical management of chronic systolic and diastolic heart failure, including weight loss and aggressive blood pressure control would also help.  95 mill contrast utilized.  EKG   EKG 01/04/2020: Atrial fibrillation with rapid ventricular response at the rate of 111 bpm, normal axis, diffuse ST-T wave abnormality, cannot exclude inferior ischemia, anterolateral ischemia.  Normal QT interval.   No significant change from  EKG 12/20/2019: Atrial fibrillation with RVR at rate of 121 bpm  EKG 03/31/2019: Atrial fibrillation controlled ventricular response at the rate of 70 bpm, normal axis.   Assessment     ICD-10-CM   1. Chronic combined systolic and diastolic heart failure (HCC)  123456 Basic metabolic panel    Pro b natriuretic peptide (BNP)[facility])    CANCELED: Pro b natriuretic peptide (BNP)9[facility])  2. Paroxysmal atrial fibrillation ([place]). CHA2DS2-VASc Score is 5. Yearly risk of stroke: 6.7% (HTN, Vasc Dz, CHF, TIA).  I48.0  3. S/P ablation of atrial fibrillation  Z98.890    Z86.79   4. Nonischemic cardiomyopathy (HCC)  I42.8   5. Coronary artery disease of native artery of native heart with stable angina pectoris (HCC)  I25.118   6. Essential hypertension  I10 Basic metabolic panel    Meds ordered this encounter  Medications   sacubitril-valsartan (ENTRESTO) 97-103 MG    Sig: Take 1 tablet by mouth 2 (two) times daily.    Dispense:  60 tablet    Refill:  3   Bempedoic Acid 180 MG TABS    Sig: Take 180 mg by mouth daily.    Dispense:  30 tablet    Refill:  3    Medications Discontinued During  This Encounter  Medication Reason   sacubitril-valsartan (ENTRESTO) 49-51 MG Change in therapy    This patients CHA2DS2-VASc Score 5 (CHF, HTN, Vasc, TIA) and yearly risk of stroke 7.2%.   Recommendations:   Danny Kelley  is a  55 y.o. Caucasian male patient with history of cardiomyopathy with chronic systolic heart failure, hypertension, hyperlipidemia (statin intolerant), obstructive sleep apnea compliant with CPAP, tobacco use.  Patient has history of TIA in Feb 2018 while on anticoagulants, paroxysmal atrial fibrillation status post ablation 11/13/2016 and 03/01/2020 (Multaq and sotalol ineffective).  History of unstable angina pectoris on 11/26/2017, angioplasty to superdominant left circumflex coronary artery with 4.5 mm resolute DES in the midsegment, patent stent placed in Distal Cx and OM 1 in 2014, 03/22/2019, stenting to high-grade stenosis in the distal dominant Circumflex PDA branch. Cardiac catheterization on 01/24/2020 revealed no change in coronary anatomy, suspect his cardiomyopathy may have been related to underlying A. Fib.  Of note patient has previously been evaluated for syncope, which was felt to be psychogenic in view of his underlying history of seizure disorder.  Patient presents for 22-month follow-up of hypertension, hyperlipidemia, CAD, and heart failure.  Patient reports he was doing well for several months, however over the last 2 months he has experienced worsening dyspnea on exertion and fatigue, as well as orthopnea.  On exam today patient appears euvolemic, without peripheral edema and with clear lung sounds.  In view of worsening symptoms will obtain repeat echocardiogram and pro-BNP.  Patient's blood pressure also remains elevated above goal, therefore will increase Entresto from 49/51mg  to 97/23 mg twice daily.  We will repeat BMP in 1 week.  By remote patient monitoring patient's average heart rate remains >70 bpm, however he experiences significantly symptomatic  episodes of bradycardia approximately twice per week.  For will hold off on up titration of diltiazem or addition of Corlanor at this time, although may consider this in the future.  In regard to atrial fibrillation patient is presently maintaining sinus rhythm. He is tolerating Eliquis without bleeding diathesis, will continue.   In regard to hyperlipidemia, discussed at length with patient regarding medical management options as LDL remains above goal.  Patient is presently tolerating Zetia well, as well as taking red yeast rice, will continue these.  Patient has been statin intolerant we will add bempedoic acid 180 mg daily. Will repeat lipid panel in 3 months.   Follow up in 1 month for hypertension and heart failure.    Rayford Halsted, PA-C 11/08/2020, 3:07 PM Office: [phone]

## 2020-11-08 ENCOUNTER — Encounter: Payer: Self-pay | Admitting: Student

## 2020-11-08 ENCOUNTER — Ambulatory Visit: Payer: Medicare Other | Admitting: Student

## 2020-11-08 ENCOUNTER — Other Ambulatory Visit: Payer: Self-pay

## 2020-11-08 VITALS — BP 148/93 | HR 80 | Ht 70.5 in | Wt 252.0 lb

## 2020-11-08 DIAGNOSIS — I10 Essential (primary) hypertension: Secondary | ICD-10-CM

## 2020-11-08 DIAGNOSIS — I5042 Chronic combined systolic (congestive) and diastolic (congestive) heart failure: Secondary | ICD-10-CM | POA: Diagnosis not present

## 2020-11-08 DIAGNOSIS — Z9889 Other specified postprocedural states: Secondary | ICD-10-CM | POA: Diagnosis not present

## 2020-11-08 DIAGNOSIS — Z8679 Personal history of other diseases of the circulatory system: Secondary | ICD-10-CM | POA: Diagnosis not present

## 2020-11-08 DIAGNOSIS — I480 Paroxysmal atrial fibrillation: Secondary | ICD-10-CM | POA: Diagnosis not present

## 2020-11-08 DIAGNOSIS — I428 Other cardiomyopathies: Secondary | ICD-10-CM | POA: Diagnosis not present

## 2020-11-08 DIAGNOSIS — I25118 Atherosclerotic heart disease of native coronary artery with other forms of angina pectoris: Secondary | ICD-10-CM | POA: Diagnosis not present

## 2020-11-08 MED ORDER — BEMPEDOIC ACID 180 MG PO TABS: 180.0000 mg | ORAL_TABLET | Freq: Every day | ORAL | 3 refills | Status: DC

## 2020-11-08 MED ORDER — ENTRESTO 97-103 MG PO TABS: 1.0000 | ORAL_TABLET | Freq: Two times a day (BID) | ORAL | 3 refills | Status: DC

## 2020-11-15 ENCOUNTER — Other Ambulatory Visit: Payer: Self-pay

## 2020-11-15 ENCOUNTER — Ambulatory Visit: Payer: Medicare Other

## 2020-11-15 DIAGNOSIS — I5042 Chronic combined systolic (congestive) and diastolic (congestive) heart failure: Secondary | ICD-10-CM

## 2020-11-15 DIAGNOSIS — I480 Paroxysmal atrial fibrillation: Secondary | ICD-10-CM | POA: Diagnosis not present

## 2020-11-23 ENCOUNTER — Other Ambulatory Visit: Payer: Self-pay

## 2020-11-23 DIAGNOSIS — I5042 Chronic combined systolic (congestive) and diastolic (congestive) heart failure: Secondary | ICD-10-CM

## 2020-11-23 DIAGNOSIS — I10 Essential (primary) hypertension: Secondary | ICD-10-CM

## 2020-11-23 MED ORDER — HYDRALAZINE HCL 50 MG PO TABS: 50.0000 mg | ORAL_TABLET | Freq: Three times a day (TID) | ORAL | 2 refills | Status: DC

## 2020-12-03 DIAGNOSIS — I10 Essential (primary) hypertension: Secondary | ICD-10-CM | POA: Diagnosis not present

## 2020-12-05 NOTE — Progress Notes (Signed)
Primary Physician/Referring:  [name], NP  Patient ID: Danny Kelley, male    DOB: [birth year], 55 y.o.   MRN: [id number]  Chief Complaint  Patient presents with  . Congestive Heart Failure  . Hypertension  . Follow-up    4 weeks   HPI:    Danny Kelley  is a 55 y.o. Caucasian male patient with history of cardiomyopathy with chronic systolic heart failure, hypertension, hyperlipidemia (statin intolerant), obstructive sleep apnea compliant with CPAP, tobacco use.  Patient has history of TIA in Feb 2018 while on anticoagulants, paroxysmal atrial fibrillation status post ablation 11/13/2016 and 03/01/2020 (Multaq and sotalol ineffective).  History of unstable angina pectoris on 11/26/2017, angioplasty to superdominant left circumflex coronary artery with 4.5 mm resolute DES in the midsegment, patent stent placed in Distal Cx and OM 1 in 2014, 03/22/2019, stenting to high-grade stenosis in the distal dominant Circumflex PDA branch. Cardiac catheterization on 01/24/2020 revealed no change in coronary anatomy, suspect his cardiomyopathy may have been related to underlying A. Fib.   Of note patient has previously been evaluated for syncope, which was felt to be psychogenic in view of his underlying history of seizure disorder. Patient is enrolled in remote patient monitoring for hypertension.  Patient has been unable to tolerate statin therapy in the past due to severe myalgias including atorvastatin, rosuvastatin, and simvastatin.  Patient presents for 1 month follow up of hypertension and heart failure. At last visit increased Entresto from 49/51 mg to 97/103 mg. Also added bempedoic acid for hyperlipidemia.  Unfortunately patient was unable to start bempedoic acid due to insurance coverage issues.  Also due to unclear whether patient has not obtained repeat BMP.  Patient continues to complain of fatigue and exertional dyspnea now with associated chest pressure.  Patient has not taken sublingual  nitroglycerin since his last visit.  Denies palpitations, syncope, near syncope, orthopnea, PND.  Patient's home blood pressure readings on average continue to be elevated above goal. Average home heart rate is 77 bpm.   Past Medical History:  Diagnosis Date  . Arthritis    "a little in my right knee" (07/14/2016)  . CAD (coronary artery disease)   . Chronic congestive heart failure with left ventricular diastolic dysfunction ([place])   . Complication of anesthesia    Pt reports "they have a hard time waking me up"  . GERD (gastroesophageal reflux disease)    hx  . Headache    "with every heart issue that I have" (07/14/2016)  . High cholesterol   . History of hiatal hernia 1990s   "fixed it w/scope down my throat"  . Hypertension   . Myocardial infarction [facility]) 05/2013   stents: left PDA, 1st OM at [facility]  . Obesity   . Persistent atrial fibrillation ([place])   . Pneumonia ~ 1992  . QT prolongation 07/15/2016  . Seasonal allergies    "I take Allegra prn" (07/14/2016)  . Tobacco abuse 12/16/2016   Past Surgical History:  Procedure Laterality Date  . APPENDECTOMY  1984  . ATRIAL FIBRILLATION ABLATION N/A 03/01/2020   Procedure: ATRIAL FIBRILLATION ABLATION;  Surgeon: [name], MD;  Location: [place] [facility] LAB;  Service: Cardiovascular;  Laterality: N/A;  . CARDIOVERSION N/A 05/27/2016   Procedure: CARDIOVERSION;  Surgeon: [name], MD;  Location: [facility] ENDOSCOPY;  Service: Cardiovascular;  Laterality: N/A;  . CARDIOVERSION N/A 06/11/2016   Procedure: CARDIOVERSION;  Surgeon: [name], MD;  Location: [place];  Service: Cardiovascular;  Laterality:  N/A;  . CARDIOVERSION N/A 03/21/2020   Procedure: CARDIOVERSION;  Surgeon: Geralynn Rile, MD;  Location: [place];  Service: Cardiovascular;  Laterality: N/A;  . CORONARY ANGIOPLASTY WITH STENT PLACEMENT  05/30/2013   "2 stents put in at [facility]" & /stent card  . CORONARY STENT INTERVENTION N/A 11/27/2017    Procedure: CORONARY STENT INTERVENTION;  Surgeon: [name], MD;  Location: [place] [facility] LAB;  Service: Cardiovascular;  Laterality: N/A;  . CORONARY STENT INTERVENTION N/A 03/21/2019   Procedure: CORONARY STENT INTERVENTION;  Surgeon: [name], MD;  Location: [place] [facility] LAB;  Service: Cardiovascular;  Laterality: N/A;  . ELECTROPHYSIOLOGIC STUDY N/A 11/13/2016   Procedure: Atrial Fibrillation Ablation;  Surgeon: [name], MD;  Location: [place] [facility] LAB;  Service: Cardiovascular;  Laterality: N/A;  . ESOPHAGOGASTRODUODENOSCOPY (EGD) WITH ESOPHAGEAL DILATION  1990s X 2  . INTRAVASCULAR PRESSURE WIRE/FFR STUDY N/A 09/21/2018   Procedure: INTRAVASCULAR PRESSURE WIRE/FFR STUDY;  Surgeon: [name], MD;  Location: [place] [facility];  Service: Cardiovascular;  Laterality: N/A;  . INTRAVASCULAR PRESSURE WIRE/FFR STUDY N/A 03/21/2019   Procedure: INTRAVASCULAR PRESSURE WIRE/FFR STUDY;  Surgeon: [name], MD;  Location: [place] [facility] LAB;  Service: Cardiovascular;  Laterality: N/A;  . INTRAVASCULAR PRESSURE WIRE/FFR STUDY N/A 01/24/2020   Procedure: INTRAVASCULAR PRESSURE WIRE/FFR STUDY;  Surgeon: [name], MD;  Location: [place] CV LAB;  Service: Cardiovascular;  Laterality: N/A;  . KNEE ARTHROSCOPY Right 1986; ~ 1995 X 2  . LAPAROSCOPIC CHOLECYSTECTOMY  2015  . LEFT HEART CATH AND CORONARY ANGIOGRAPHY N/A 11/27/2017   Procedure: LEFT HEART CATH AND CORONARY ANGIOGRAPHY;  Surgeon: [name], MD;  Location: [place] [facility] LAB;  Service: Cardiovascular;  Laterality: N/A;  . LEFT HEART CATH AND CORONARY ANGIOGRAPHY N/A 09/21/2018   Procedure: LEFT HEART CATH AND CORONARY ANGIOGRAPHY;  Surgeon: [name], MD;  Location: [place] [facility];  Service: Cardiovascular;  Laterality: N/A;  . LEFT HEART CATH AND CORONARY ANGIOGRAPHY N/A 03/21/2019   Procedure: LEFT HEART CATH AND CORONARY ANGIOGRAPHY;  Surgeon: [name], MD;  Location: [place] [facility];  Service: Cardiovascular;   Laterality: N/A;  . REPAIR OF ESOPHAGUS  1998   perforation of the distal esophagus from bad heartburn  . RIGHT/LEFT HEART CATH AND CORONARY ANGIOGRAPHY N/A 01/24/2020   Procedure: RIGHT/LEFT HEART CATH AND CORONARY ANGIOGRAPHY;  Surgeon: [name], MD;  Location: [place] [facility];  Service: Cardiovascular;  Laterality: N/A;  . TEE WITHOUT CARDIOVERSION N/A 11/13/2016   Procedure: TRANSESOPHAGEAL ECHOCARDIOGRAM (TEE);  Surgeon: Thayer Headings, MD;  Location: [facility] ENDOSCOPY;  Service: Cardiovascular;  Laterality: N/A;   Family History  Problem Relation Age of Onset  . Heart disease Mother 65       CABG age 43  . Breast cancer Mother   . Tongue cancer Mother   . Diabetes Mother   . Heart attack Father 70       Father had around 7 heart attacks per pt  . Diabetes Father   . Lung cancer Father   . Congestive Heart Failure Father   . Lung cancer Paternal Uncle     Social History   Tobacco Use  . Smoking status: Former Smoker    Years: 25.00    Types: Cigarettes, E-cigarettes    Quit date: 01/15/2020    Years since quitting: 0.8  . Smokeless tobacco: Never Used  . Tobacco comment: Stopped smoking Sept 3, 2021  Substance Use Topics  . Alcohol use:  No   ROS  Review of Systems  Constitutional: Positive for malaise/fatigue and weight gain. Negative for decreased appetite and weight loss.  Cardiovascular: Positive for chest pain (pressure) and dyspnea on exertion. Negative for claudication, leg swelling, near-syncope, orthopnea, palpitations, paroxysmal nocturnal dyspnea and syncope.  Respiratory: Negative for shortness of breath.   Hematologic/Lymphatic: Does not bruise/bleed easily.  Gastrointestinal: Negative for abdominal pain and melena.  Neurological: Positive for dizziness. Negative for headaches and weakness.  Psychiatric/Behavioral: Negative for suicidal ideas.  All other systems reviewed and are negative.  Objective  Blood pressure 116/79, pulse 64, temperature 98.4 F  (36.9 C), temperature source Temporal, resp. rate 14, height 5' 10.5" (1.791 m), weight 258 lb (117 kg), SpO2 97 %.  Vitals with BMI 12/06/2020 11/08/2020 10/01/2020  Height 5' 10.5" 5' 10.5" 5' 10.5"  Weight 258 lbs 252 lbs 252 lbs  BMI 36.48 [id number] [id number]  Systolic [id number]  Diastolic 79 93 84  Pulse 64 80 90     Physical Exam Vitals reviewed.  Constitutional:      Comments: Moderately obese  HENT:     Head: Normocephalic and atraumatic.  Cardiovascular:     Rate and Rhythm: Normal rate and regular rhythm.     Pulses: Intact distal pulses.     Heart sounds: Normal heart sounds, S1 normal and S2 normal. No murmur heard. No gallop.      Comments: No leg edema, no JVD. Pulmonary:     Effort: Pulmonary effort is normal. No accessory muscle usage or respiratory distress.     Breath sounds: Normal breath sounds. No wheezing, rhonchi or rales.  Abdominal:     General: Bowel sounds are normal.     Palpations: Abdomen is soft.  Musculoskeletal:        General: Normal range of motion.     Right lower leg: No edema.     Left lower leg: No edema.  Neurological:     Mental Status: He is alert.    Laboratory examination:   Recent Labs    01/02/20 1445 01/24/20 1500 01/24/20 1523 02/01/20 1301 03/12/20 1512  NA 139   < > 139 137 141  K 4.4   < > 3.6 3.8 4.0  CL 98  --   --  105 108  CO2 26  --   --  23 25  GLUCOSE 151*  --   --  138* 232*  BUN 12  --   --  12 17  CREATININE 0.88  --   --  0.79 0.95  CALCIUM 9.4  --   --  8.8* 8.9  GFRNONAA 99  --   --  >60 >60  GFRAA 115  --   --  >60 >60   < > = values in this interval not displayed.   CrCl cannot be calculated (Patient's most recent lab result is older than the maximum 21 days allowed.).  CMP Latest Ref Rng & Units 03/12/2020 02/01/2020 01/24/2020  Glucose 70 - 99 mg/dL 232(H) 138(H) -  BUN 6 - 20 mg/dL 17 12 -  Creatinine 0.61 - 1.24 mg/dL 0.95 0.79 -  Sodium 135 - 145 mmol/L 141 137 139  Potassium 3.5 - 5.1 mmol/L 4.0  3.8 3.6  Chloride 98 - 111 mmol/L 108 105 -  CO2 22 - 32 mmol/L 25 23 -  Calcium 8.9 - 10.3 mg/dL 8.9 8.8(L) -  Total Protein 6.5 - 8.1 g/dL - 7.4 -  Total Bilirubin 0.3 -  1.2 mg/dL - 0.4 -  Alkaline Phos 38 - 126 U/L - 140(H) -  AST 15 - 41 U/L - 18 -  ALT 0 - 44 U/L - 27 -   CBC Latest Ref Rng & Units 03/12/2020 02/01/2020 01/24/2020  WBC 4.0 - 10.5 K/uL 11.1(H) 9.6 -  Hemoglobin 13.0 - 17.0 g/dL 16.6 17.6(H) 15.6  Hematocrit 39.0 - 52.0 % 49.7 51.5 46.0  Platelets 150 - 400 K/uL 254 259 -   Lipid Panel     Component Value Date/Time   CHOL 239 (H) 01/02/2020 1445   TRIG 143 01/02/2020 1445   HDL 62 01/02/2020 1445   CHOLHDL 5.0 11/27/2017 0634   VLDL 16 11/27/2017 0634   LDLCALC 152 (H) 01/02/2020 1445   HEMOGLOBIN A1C Lab Results  Component Value Date   HGBA1C 6.0 (H) 11/27/2017   MPG 125.5 11/27/2017   TSH Recent Labs    01/02/20 1445  TSH 2.080   BNP No results found for: BNP  ProBNP No results found for: PROBNP   Medications and allergies   Allergies  Allergen Reactions  . Codeine Anaphylaxis  . Crestor [Rosuvastatin] Other (See Comments)    Severe leg cramps  . Dilaudid [Hydromorphone] Itching and Swelling  . Isosorbide     Muscle pain and cramps  . Lexapro [Escitalopram]     Have bad thoughts made anxiety worse   . Lipitor [Atorvastatin Calcium] Other (See Comments)    myalgias  . Erythromycin Other (See Comments)    Hallucinations   . Oxycodone Itching and Rash    Swelling to face, hands, mouth Completley intolerant to any meds with oxycodone in it      Current Outpatient Medications  Medication Instructions  . acetaminophen (TYLENOL) 500 mg, Oral, 2 times daily PRN  . amLODipine (NORVASC) 10 mg, Oral, Daily  . apixaban (ELIQUIS) 5 mg, Oral, 2 times daily  . bisacodyl (DULCOLAX) 5 mg, Oral, Daily PRN  . diltiazem (CARDIZEM) 30 MG tablet Take 1 tablet every 4 hours AS NEEDED for heart rate >110  . docusate sodium (COLACE) 100 mg, Oral, 2  times daily PRN  . Evolocumab (REPATHA) 140 MG/ML SOSY 1 mL, Subcutaneous, Every 14 days  . ezetimibe (ZETIA) 10 mg, Oral, Daily  . fexofenadine (ALLEGRA) 180 mg, Oral, Daily PRN  . hydrALAZINE (APRESOLINE) 50 mg, Oral, 3 times daily  . metoprolol tartrate (LOPRESSOR) 100 MG tablet Take 1 tablet by mouth twice daily  . nitroGLYCERIN (NITROSTAT) 0.4 MG SL tablet DISSOLVE ONE TABLET UNDER THE TONGUE EVERY 5 MINUTES AS NEEDED FOR CHEST PAIN.  . sacubitril-valsartan (ENTRESTO) 97-103 MG 1 tablet, Oral, 2 times daily  . shark liver oil-cocoa butter (PREPARATION H) 0.25-3-85.5 % suppository 1 suppository, Rectal, 2 times daily PRN  . torsemide (DEMADEX) 20 mg, Daily PRN    Radiology:   No results found.  Cardiac Studies:    PCV ECHOCARDIOGRAM COMPLETE 11/15/2020 Study Quality: Technically difficult study. Normal LV systolic function with visual EF 50-55%. Left ventricle cavity is normal in size. Normal left ventricular wall thickness. Normal global wall motion. Normal diastolic filling pattern, normal LAP. Insignificant pericardial effusion. There is no hemodynamic significance. No significant valvular heart disease. Compared to prior study dated 12/28/2019: LVEF improved from 25-30% to 50-55% otherwise no significant change.  14-day cardiac event monitor 08/21/2020: Sinus rhythm Premature atrial contractions and premature ventricular contractions are noted Rare nonsustained ventricular tachycardia No sustained arrhythmias  Left and right heart catheterization 01/24/2020: Right heart: RA 18/18, mean  17; RV 44/10, EDP 15; PA 48/19, mean 33.  PA saturation 72%.  PW 25/26, mean 23 mmHg.  Aortic saturation 98%. Cardiac output 4.73, cardiac index 2.09, reduced.  QP/QS 1.00. Impression: Mild decrease in cardiac output and cardiac index.  Mild to moderate pulmonary hypertension related to elevated LVEDP.  Left heart catheterization: LV pressure 139/17, EDP 31 mmHg.  Aortic pressure 144/97,  mean 122 mmHg.  No pressure gradient. Right coronary artery is nondominant but a large vessel with a proximal 70% stenosis unchanged from cardiac catheterization on 03/21/2019. Left main is large.  It trifurcates. Circumflex: Dominant vessel.  Previously placed stent in the proximal to mid circumflex, OM1 and also PDA widely patent. Ramus intermediate: Large vessel, mild disease. LAD: Large vessel, diffuse 60 to 70% in some views appears to be almost 80% proximal long segment stenosis.  DFR 0.91, FFR 0.89,  not hemodynamically significant.  Lesion slightly progressed compared to prior angiography in 2020.   Overall impression: Nonischemic cardiomyopathy, probably related to underlying atrial fibrillation and hypertensive heart disease.  No significant change in coronary anatomy, has moderate underlying disease in the LAD but not hemodynamically significant and previously placed stent in the circumflex widely patent (Circumflex stent placed 11/27/2017, 4.5 x 18 mm resolute widely patent, patent 3.5 x 18 mm Xience DES to dominant distal left circumflex, 2.5 x 12 mm Xience to OM1 placed 05/30/2013.)    Recommendation: Would consider atrial fibrillation ablation to evaluate and see whether his LVEF would improve.  Aggressive medical management of chronic systolic and diastolic heart failure, including weight loss and aggressive blood pressure control would also help.  95 mill contrast utilized.  Sleep study X 2 negative   Event Monitor 30 days 07/28/2018: Paroxysmal episodes of atrial fibrillation with controlled ventricular response was noted along with PVCs. Some of this symptoms of chest pain, palpitations and dizziness correlated with atrial fibrillation, others showed normal sinus rhythm. No heart block, no other significant arrhythmias.  Direct current cardioversion ( Ist 05/27/16) 06/11/2016: 150x2, 200 x1 to NSR on Sotalol. A. Fib ablation by Dr. [name] on 11/13/2016  EKG   EKG 12/06/20:  Sinus bradycardia at a rate of 55 bpm.  Normal axis.  No evidence of ischemia or injury pattern.   EKG 01/04/2020: Atrial fibrillation with rapid ventricular response at the rate of 111 bpm, normal axis, diffuse ST-T wave abnormality, cannot exclude inferior ischemia, anterolateral ischemia.  Normal QT interval.   No significant change from  EKG 12/20/2019: Atrial fibrillation with RVR at rate of 121 bpm  EKG 03/31/2019: Atrial fibrillation controlled ventricular response at the rate of 70 bpm, normal axis.   Assessment     ICD-10-CM   1. Chronic combined systolic and diastolic heart failure (HCC)  I50.42   2. Essential hypertension  I10   3. Hypercholesteremia  E78.00 Evolocumab (REPATHA) 140 MG/ML SOSY    ezetimibe (ZETIA) 10 MG tablet    Evolocumab SOAJ 140 mg  4. Paroxysmal atrial fibrillation ([place]). CHA2DS2-VASc Score is 5. Yearly risk of stroke: 6.7% (HTN, Vasc Dz, CHF, TIA).  I48.0   5. Coronary artery disease of native artery of native heart with stable angina pectoris (HCC)  I25.118 EKG 12-Lead    Evolocumab (REPATHA) 140 MG/ML SOSY    ezetimibe (ZETIA) 10 MG tablet  6. Chest pressure  R07.89 Ambulatory referral to Gastroenterology  7. Shortness of breath  R06.02 Ambulatory referral to Gastroenterology    Meds ordered this encounter  Medications  . Evolocumab (REPATHA)  140 MG/ML SOSY    Sig: Inject 1 mL into the skin every 14 (fourteen) days.    Dispense:  2 mL    Refill:  3  . ezetimibe (ZETIA) 10 MG tablet    Sig: Take 1 tablet (10 mg total) by mouth daily.    Dispense:  30 tablet    Refill:  12  . Evolocumab SOAJ 140 mg    Medications Discontinued During This Encounter  Medication Reason  . famotidine (PEPCID) 40 MG tablet Patient Preference  . pantoprazole (PROTONIX) 40 MG tablet Error  . Bempedoic Acid 180 MG TABS Change in therapy  . ezetimibe (ZETIA) 10 MG tablet Reorder    This patients CHA2DS2-VASc Score 5 (CHF, HTN, Vasc, TIA) and yearly risk of stroke 7.2%.    Recommendations:   Danny Kelley  is a  55 y.o. Caucasian male patient with history of cardiomyopathy with chronic systolic heart failure, hypertension, hyperlipidemia (statin intolerant), obstructive sleep apnea compliant with CPAP, tobacco use.  Patient has history of TIA in Feb 2018 while on anticoagulants, paroxysmal atrial fibrillation status post ablation 11/13/2016 and 03/01/2020 (Multaq and sotalol ineffective).  History of unstable angina pectoris on 11/26/2017, angioplasty to superdominant left circumflex coronary artery with 4.5 mm resolute DES in the midsegment, patent stent placed in Distal Cx and OM 1 in 2014, 03/22/2019, stenting to high-grade stenosis in the distal dominant Circumflex PDA branch. Cardiac catheterization on 01/24/2020 revealed no change in coronary anatomy, suspect his cardiomyopathy may have been related to underlying A. Fib.   Of note patient has previously been evaluated for syncope, which was felt to be psychogenic in view of his underlying history of seizure disorder. Patient is enrolled in remote patient monitoring for hypertension.  Patient has been unable to tolerate statin therapy in the past due to severe myalgias including atorvastatin, rosuvastatin, and simvastatin.  Patient presents for 17-month follow-up of hypertension, hyperlipidemia, heart failure, and CAD. There are no clinical signs of heart failure. Reviewed and discussed with patient results of echocardiogram revealing improved LVEF to 50-55%.  Patient continues to complain of exertional dyspnea associated with chest pressure.  Patient has had extensive cardiac work-up relatively recently including coronary angiography less than 1 year ago revealing stable coronary anatomy without evidence of ischemia.  In view of relatively recent cardiac catheterization and improved echo, no identifiable cardiac etiology of patient's symptoms, suspect may be related to underlying GI cause.  Therefore will refer patient to  gastroenterology for evaluation of symptoms.  If GI evaluation is negative, will then consider repeat cardiac catheterization at that time.  In regard to hyperlipidemia, patient has been unable to start bempedoic acid due to insurance issues.  Therefore Will start patient on Repatha for management of hyperlipidemia as LDL remains above goal despite Zetia. Will recheck lipid panel in 3 months.   In regard to hypertension, would like to improve blood pressure control. However, will wait for repeat BMP since increasing Entresto to make adjustments to antihypertensive medications.   By remote patient monitoring patient's average heart rate remains >70 bpm, however he experiences significantly symptomatic episodes of bradycardia approximately twice per week.  For will hold off on up titration of diltiazem or addition of Corlanor at this time, although may consider this in the future. In regard to atrial fibrillation patient is presently maintaining sinus rhythm. He is tolerating Eliquis without bleeding diathesis, will continue.   Follow up in 3 months for hypertension, hyperlipidemia, and CAD.   Patient was seen  in collaboration with Dr. Einar Gip. He also reviewed patient's chart and Dr. Einar Gip is in agreement of the plan.   Evolocumab SOAJ 140 mg Administration Action Time Recorded Time Documented By Site Comment Reason Patient Supplied  Given : 140 mg :  : Subcutaneous 12/06/20 1403 12/06/20 1405 [place], [place] Left Anterior Thigh         Alethia Berthold, PA-C 12/06/2020, 3:27 PM Office: [phone]

## 2020-12-06 ENCOUNTER — Ambulatory Visit: Payer: Medicare Other | Admitting: Student

## 2020-12-06 ENCOUNTER — Encounter: Payer: Self-pay | Admitting: Student

## 2020-12-06 ENCOUNTER — Other Ambulatory Visit: Payer: Self-pay

## 2020-12-06 VITALS — BP 116/79 | HR 64 | Temp 98.4°F | Resp 14 | Ht 70.5 in | Wt 258.0 lb

## 2020-12-06 DIAGNOSIS — I480 Paroxysmal atrial fibrillation: Secondary | ICD-10-CM

## 2020-12-06 DIAGNOSIS — R0789 Other chest pain: Secondary | ICD-10-CM

## 2020-12-06 DIAGNOSIS — E7800 Pure hypercholesterolemia, unspecified: Secondary | ICD-10-CM

## 2020-12-06 DIAGNOSIS — I25118 Atherosclerotic heart disease of native coronary artery with other forms of angina pectoris: Secondary | ICD-10-CM

## 2020-12-06 DIAGNOSIS — R0602 Shortness of breath: Secondary | ICD-10-CM | POA: Diagnosis not present

## 2020-12-06 DIAGNOSIS — I10 Essential (primary) hypertension: Secondary | ICD-10-CM | POA: Diagnosis not present

## 2020-12-06 DIAGNOSIS — I5042 Chronic combined systolic (congestive) and diastolic (congestive) heart failure: Secondary | ICD-10-CM | POA: Diagnosis not present

## 2020-12-06 MED ORDER — REPATHA 140 MG/ML ~~LOC~~ SOSY: 1.0000 mL | PREFILLED_SYRINGE | SUBCUTANEOUS | 3 refills | Status: DC

## 2020-12-06 MED ORDER — EVOLOCUMAB 140 MG/ML ~~LOC~~ SOAJ
140.0000 mg | Freq: Once | SUBCUTANEOUS | Status: AC
  Administered 2020-12-06: 140 mg via SUBCUTANEOUS

## 2020-12-06 MED ORDER — EZETIMIBE 10 MG PO TABS: 10.0000 mg | ORAL_TABLET | Freq: Every day | ORAL | 12 refills | Status: DC

## 2020-12-10 DIAGNOSIS — I10 Essential (primary) hypertension: Secondary | ICD-10-CM | POA: Diagnosis not present

## 2020-12-10 DIAGNOSIS — I5042 Chronic combined systolic (congestive) and diastolic (congestive) heart failure: Secondary | ICD-10-CM | POA: Diagnosis not present

## 2020-12-11 LAB — BASIC METABOLIC PANEL
BUN/Creatinine Ratio: 18 (ref 9–20)
BUN: 16 mg/dL (ref 6–24)
CO2: 25 mmol/L (ref 20–29)
Calcium: 8.7 mg/dL (ref 8.7–10.2)
Chloride: 98 mmol/L (ref 96–106)
Creatinine, Ser: 0.91 mg/dL (ref 0.76–1.27)
GFR calc Af Amer: 112 mL/min/{1.73_m2} (ref >59)
GFR calc non Af Amer: 97 mL/min/{1.73_m2} (ref >59)
Glucose: 155 mg/dL — ABNORMAL HIGH (ref 65–99)
Potassium: 4.2 mmol/L (ref 3.5–5.2)
Sodium: 135 mmol/L (ref 134–144)

## 2020-12-11 LAB — PRO B NATRIURETIC PEPTIDE: NT-Pro BNP: 74 pg/mL (ref 0–121)

## 2020-12-11 NOTE — Telephone Encounter (Signed)
From patient.

## 2020-12-11 NOTE — Progress Notes (Signed)
I have messaged patient on MyChart regarding stable renal function and normal BNP.

## 2020-12-22 ENCOUNTER — Other Ambulatory Visit: Payer: Self-pay | Admitting: Cardiology

## 2020-12-24 ENCOUNTER — Encounter: Payer: Self-pay | Admitting: Nurse Practitioner

## 2020-12-26 ENCOUNTER — Other Ambulatory Visit: Payer: Self-pay

## 2020-12-26 DIAGNOSIS — I25118 Atherosclerotic heart disease of native coronary artery with other forms of angina pectoris: Secondary | ICD-10-CM

## 2020-12-26 DIAGNOSIS — E7800 Pure hypercholesterolemia, unspecified: Secondary | ICD-10-CM

## 2020-12-26 MED ORDER — REPATHA 140 MG/ML ~~LOC~~ SOSY: 1.0000 mL | PREFILLED_SYRINGE | SUBCUTANEOUS | 3 refills | Status: DC

## 2021-01-02 ENCOUNTER — Ambulatory Visit: Payer: Medicare Other | Admitting: Internal Medicine

## 2021-01-03 DIAGNOSIS — I10 Essential (primary) hypertension: Secondary | ICD-10-CM | POA: Diagnosis not present

## 2021-01-09 ENCOUNTER — Ambulatory Visit (INDEPENDENT_AMBULATORY_CARE_PROVIDER_SITE_OTHER): Payer: Medicare Other | Admitting: Nurse Practitioner

## 2021-01-09 ENCOUNTER — Other Ambulatory Visit (INDEPENDENT_AMBULATORY_CARE_PROVIDER_SITE_OTHER): Payer: Medicare Other

## 2021-01-09 ENCOUNTER — Other Ambulatory Visit: Payer: Self-pay

## 2021-01-09 ENCOUNTER — Encounter: Payer: Self-pay | Admitting: Nurse Practitioner

## 2021-01-09 VITALS — BP 150/110 | HR 81 | Ht 70.5 in | Wt 253.0 lb

## 2021-01-09 DIAGNOSIS — R0602 Shortness of breath: Secondary | ICD-10-CM

## 2021-01-09 DIAGNOSIS — I4819 Other persistent atrial fibrillation: Secondary | ICD-10-CM | POA: Diagnosis not present

## 2021-01-09 DIAGNOSIS — R079 Chest pain, unspecified: Secondary | ICD-10-CM

## 2021-01-09 DIAGNOSIS — I25118 Atherosclerotic heart disease of native coronary artery with other forms of angina pectoris: Secondary | ICD-10-CM

## 2021-01-09 LAB — COMPREHENSIVE METABOLIC PANEL
ALT: 21 U/L (ref 0–53)
AST: 14 U/L (ref 0–37)
Albumin: 3.5 g/dL (ref 3.5–5.2)
Alkaline Phosphatase: 111 U/L (ref 39–117)
BUN: 13 mg/dL (ref 6–23)
CO2: 28 mEq/L (ref 19–32)
Calcium: 9.1 mg/dL (ref 8.4–10.5)
Chloride: 101 mEq/L (ref 96–112)
Creatinine, Ser: 1.07 mg/dL (ref 0.40–1.50)
GFR: 79.9 mL/min (ref >60.00)
Glucose, Bld: 128 mg/dL — ABNORMAL HIGH (ref 70–99)
Potassium: 3.6 mEq/L (ref 3.5–5.1)
Sodium: 137 mEq/L (ref 135–145)
Total Bilirubin: 0.4 mg/dL (ref 0.2–1.2)
Total Protein: 7.5 g/dL (ref 6.0–8.3)

## 2021-01-09 LAB — CBC WITH DIFFERENTIAL/PLATELET
Basophils Absolute: 0.1 10*3/uL (ref 0.0–0.1)
Basophils Relative: 0.9 % (ref 0.0–3.0)
Eosinophils Absolute: 0.2 10*3/uL (ref 0.0–0.7)
Eosinophils Relative: 1.8 % (ref 0.0–5.0)
HCT: 48.2 % (ref 39.0–52.0)
Hemoglobin: 16.9 g/dL (ref 13.0–17.0)
Lymphocytes Relative: 27.7 % (ref 12.0–46.0)
Lymphs Abs: 2.9 10*3/uL (ref 0.7–4.0)
MCHC: 35.1 g/dL (ref 30.0–36.0)
MCV: 81.8 fl (ref 78.0–100.0)
Monocytes Absolute: 0.9 10*3/uL (ref 0.1–1.0)
Monocytes Relative: 8.4 % (ref 3.0–12.0)
Neutro Abs: 6.3 10*3/uL (ref 1.4–7.7)
Neutrophils Relative %: 61.2 % (ref 43.0–77.0)
Platelets: 279 10*3/uL (ref 150.0–400.0)
RBC: 5.89 Mil/uL — ABNORMAL HIGH (ref 4.22–5.81)
RDW: 13.9 % (ref 11.5–15.5)
WBC: 10.3 10*3/uL (ref 4.0–10.5)

## 2021-01-09 NOTE — Progress Notes (Signed)
01/09/2021 KHAYMAN KIRSCH [id number] Jan 13, 1966   CHIEF COMPLAINT: Chest pain  HISTORY OF PRESENT ILLNESS:  Danny Kelley is a 55 year old male with a past medical history of anxiety, depression, coronary artery disease, s/p MI s/p stent x 2 in 2014 and x 1 in 5732, diastolic CHF, nonischemic cardiomyopathy, atrial fibrillation s/p ablation 02/2020 on Eliquis, PVCs, hyperlipidemia (intolerant to statins), arthritis, headaches, remote sleep apnea does not use cpap and GERD.  Past appendectomy as a child. S/P cholecystectomy secondary to gallstones 2015. History of prolonged sedation with anesthesia.  He presents to our office today as referred by [facility] cardiology PA-C and Dr. Einar Gip and for further evaluation regarding atypical chest pain with shortness of breath x 3 months.  He describes episodes of chest pressure " feels like an elephant is sitting on my chest"  with associated shortness of breath which occurs when sedentary and when active and is worse when he lays flat in bed.  The episodes of chest pain occur once to twice weekly and last for approximately 10 minutes.  No associated nausea or vomiting.  Eating does not trigger his chest pain or shortness of breath.  No heartburn or dysphagia.  He has a chronic dry nonproductive cough.  No hemoptysis.  He has dyspnea with exertion.  He walks about 2 miles on a daily basis. During his walks, he develops active shortness of breath but stops for a few seconds to catch his breath then continues to walk.  He stated having sleep apnea 20 years ago which resolved after he lost weight and 2 subsequent sleep studies were negative. Twenty years ago he weighed 370 pounds and currently weighs 253 pounds.  He is a former smoker, quit smoking cigarettes less than 1 year ago. He reports having a history of GERD which also was diagnosed approximately 20 years ago.  He reported having his esophagus dilated in the late 1990's  due to an esophageal narrowing  per Dr. Lyda Jester in [place].  He stated having a " few holes in my esophagus" which was repaired during an endoscopy, further details are quite unclear.  He denies having any upper or lower abdominal pain.  He typically passes a normal formed brown bowel movement once or twice daily.  He has infrequent rectal bleeding which he attributes to having hemorrhoids.  Past anal fissure in 2014-2015. Occasionally has diarrhea or constipation.  No melena.  He has frequent abdominal bloat.  He denies ever having a screening colonoscopy.  No known family history of colorectal cancer.  No NSAID use.  He remains on Eliquis secondary to atrial fibrillation.  His blood pressure is elevated at 150/110 in office today with a stable heart rate 81.  No active chest pain or shortness of breath at this time.  He stated his heart rate and blood pressure have been up and down for the past year.  He stated his heart rate will range 36-110 b/min.  He recently underwent a Zio patch monitor which showed only sinus rhythm with PACs/PVCs without evidence of atrial fibrillation.  Most recent echo was 11/15/2020 which showed LVEF 50 to 55% (prior LV EF 25 -30% 12/28/2019).  He underwent coronary angiogram less than 1 year ago which revealed stable coronary anatomy without evidence of ischemia.  He was advised by his cardiology team to undergo GI evaluation, if negative a repeat cardiac catheterization would be considered.   CBC Latest Ref Rng & Units 03/12/2020 02/01/2020 01/24/2020  WBC 4.0 - 10.5 K/uL 11.1(H) 9.6 -  Hemoglobin 13.0 - 17.0 g/dL 16.6 17.6(H) 15.6  Hematocrit 39.0 - 52.0 % 49.7 51.5 46.0  Platelets 150 - 400 K/uL 254 259 -    CMP Latest Ref Rng & Units 12/10/2020 03/12/2020 02/01/2020  Glucose 65 - 99 mg/dL 155(H) 232(H) 138(H)  BUN 6 - 24 mg/dL 16 17 12   Creatinine 0.76 - 1.27 mg/dL 0.91 0.95 0.79  Sodium 134 - 144 mmol/L 135 141 137  Potassium 3.5 - 5.2 mmol/L 4.2 4.0 3.8  Chloride 96 - 106 mmol/L 98 108 105  CO2  20 - 29 mmol/L 25 25 23   Calcium 8.7 - 10.2 mg/dL 8.7 8.9 8.8(L)  Total Protein 6.5 - 8.1 g/dL - - 7.4  Total Bilirubin 0.3 - 1.2 mg/dL - - 0.4  Alkaline Phos 38 - 126 U/L - - 140(H)  AST 15 - 41 U/L - - 18  ALT 0 - 44 U/L - - 27    Past Medical History:  Diagnosis Date  . Arthritis    "a little in my right knee" (07/14/2016)  . CAD (coronary artery disease)   . Chronic congestive heart failure with left ventricular diastolic dysfunction ([place])   . Complication of anesthesia    Pt reports "they have a hard time waking me up"  . GERD (gastroesophageal reflux disease)    hx  . Headache    "with every heart issue that I have" (07/14/2016)  . High cholesterol   . History of hiatal hernia 1990s   "fixed it w/scope down my throat"  . Hypertension   . Myocardial infarction [facility]) 05/2013   stents: left PDA, 1st OM at [facility]  . Obesity   . Persistent atrial fibrillation ([place])   . Pneumonia ~ 1992  . QT prolongation 07/15/2016  . Seasonal allergies    "I take Allegra prn" (07/14/2016)  . Tobacco abuse 12/16/2016   Past Surgical History:  Procedure Laterality Date  . APPENDECTOMY  1984  . ATRIAL FIBRILLATION ABLATION N/A 03/01/2020   Procedure: ATRIAL FIBRILLATION ABLATION;  Surgeon: [name], MD;  Location: [place] CV LAB;  Service: Cardiovascular;  Laterality: N/A;  . CARDIOVERSION N/A 05/27/2016   Procedure: CARDIOVERSION;  Surgeon: [name], MD;  Location: [facility] ENDOSCOPY;  Service: Cardiovascular;  Laterality: N/A;  . CARDIOVERSION N/A 06/11/2016   Procedure: CARDIOVERSION;  Surgeon: [name], MD;  Location: [facility] ENDOSCOPY;  Service: Cardiovascular;  Laterality: N/A;  . CARDIOVERSION N/A 03/21/2020   Procedure: CARDIOVERSION;  Surgeon: Geralynn Rile, MD;  Location: [place];  Service: Cardiovascular;  Laterality: N/A;  . CORONARY ANGIOPLASTY WITH STENT PLACEMENT  05/30/2013   "2 stents put in at [facility]" & /stent card  . CORONARY STENT INTERVENTION N/A  11/27/2017   Procedure: CORONARY STENT INTERVENTION;  Surgeon: [name], MD;  Location: [place] [facility] LAB;  Service: Cardiovascular;  Laterality: N/A;  . CORONARY STENT INTERVENTION N/A 03/21/2019   Procedure: CORONARY STENT INTERVENTION;  Surgeon: [name], MD;  Location: [place] CV LAB;  Service: Cardiovascular;  Laterality: N/A;  . ELECTROPHYSIOLOGIC STUDY N/A 11/13/2016   Procedure: Atrial Fibrillation Ablation;  Surgeon: [name], MD;  Location: [place] CV LAB;  Service: Cardiovascular;  Laterality: N/A;  . ESOPHAGOGASTRODUODENOSCOPY (EGD) WITH ESOPHAGEAL DILATION  1990s X 2  . INTRAVASCULAR PRESSURE WIRE/FFR STUDY N/A 09/21/2018   Procedure: INTRAVASCULAR PRESSURE WIRE/FFR STUDY;  Surgeon: [name], MD;  Location: [place] [facility] LAB;  Service: Cardiovascular;  Laterality: N/A;  . INTRAVASCULAR PRESSURE WIRE/FFR STUDY N/A 03/21/2019   Procedure: INTRAVASCULAR PRESSURE WIRE/FFR STUDY;  Surgeon: [name], MD;  Location: [place] CV LAB;  Service: Cardiovascular;  Laterality: N/A;  . INTRAVASCULAR PRESSURE WIRE/FFR STUDY N/A 01/24/2020   Procedure: INTRAVASCULAR PRESSURE WIRE/FFR STUDY;  Surgeon: [name], MD;  Location: [place] [facility] LAB;  Service: Cardiovascular;  Laterality: N/A;  . KNEE ARTHROSCOPY Right 1986; ~ 1995 X 2  . LAPAROSCOPIC CHOLECYSTECTOMY  2015  . LEFT HEART CATH AND CORONARY ANGIOGRAPHY N/A 11/27/2017   Procedure: LEFT HEART CATH AND CORONARY ANGIOGRAPHY;  Surgeon: [name], MD;  Location: [place] [facility];  Service: Cardiovascular;  Laterality: N/A;  . LEFT HEART CATH AND CORONARY ANGIOGRAPHY N/A 09/21/2018   Procedure: LEFT HEART CATH AND CORONARY ANGIOGRAPHY;  Surgeon: [name], MD;  Location: [place] [facility] LAB;  Service: Cardiovascular;  Laterality: N/A;  . LEFT HEART CATH AND CORONARY ANGIOGRAPHY N/A 03/21/2019   Procedure: LEFT HEART CATH AND CORONARY ANGIOGRAPHY;  Surgeon: [name], MD;  Location: [place] CV LAB;  Service:  Cardiovascular;  Laterality: N/A;  . REPAIR OF ESOPHAGUS  1998   perforation of the distal esophagus from bad heartburn  . RIGHT/LEFT HEART CATH AND CORONARY ANGIOGRAPHY N/A 01/24/2020   Procedure: RIGHT/LEFT HEART CATH AND CORONARY ANGIOGRAPHY;  Surgeon: [name], MD;  Location: [place] [facility] LAB;  Service: Cardiovascular;  Laterality: N/A;  . TEE WITHOUT CARDIOVERSION N/A 11/13/2016   Procedure: TRANSESOPHAGEAL ECHOCARDIOGRAM (TEE);  Surgeon: Thayer Headings, MD;  Location: [place];  Service: Cardiovascular;  Laterality: N/A;   Social History: He is married.  He has 1 son and 1 daughter.  Cigarette smoker x25 years, quit several years ago.  No alcohol use.  No drug use.  Family History: Mother deceased age 41 history of breast cancer with metastasis, heart disease s/p CABG x 2 DM. Father deceased age 107 with lung cancer, MI, CHF.    Allergies  Allergen Reactions  . Codeine Anaphylaxis  . Crestor [Rosuvastatin] Other (See Comments)    Severe leg cramps  . Dilaudid [Hydromorphone] Itching and Swelling  . Isosorbide     Muscle pain and cramps  . Lexapro [Escitalopram]     Have bad thoughts made anxiety worse   . Lipitor [Atorvastatin Calcium] Other (See Comments)    myalgias  . Erythromycin Other (See Comments)    Hallucinations   . Oxycodone Itching and Rash    Swelling to face, hands, mouth Completley intolerant to any meds with oxycodone in it       Outpatient Encounter Medications as of 01/09/2021  Medication Sig  . acetaminophen (TYLENOL) 500 MG tablet Take 500 mg by mouth 2 (two) times daily as needed for moderate pain.  Marland Kitchen amLODipine (NORVASC) 10 MG tablet Take 1 tablet (10 mg total) by mouth daily.  . bisacodyl (DULCOLAX) 5 MG EC tablet Take 5 mg by mouth daily as needed.   . diltiazem (CARDIZEM) 30 MG tablet Take 1 tablet every 4 hours AS NEEDED for heart rate >110  . docusate sodium (COLACE) 100 MG capsule Take 100 mg by mouth 2 (two) times daily as needed.   Marland Kitchen  ELIQUIS 5 MG TABS tablet Take 1 tablet by mouth twice daily  . Evolocumab (REPATHA) 140 MG/ML SOSY Inject 1 mL into the skin every 14 (fourteen) days.  Marland Kitchen ezetimibe (ZETIA) 10 MG tablet Take 1 tablet (10 mg total) by mouth daily.  . fexofenadine (ALLEGRA) 180 MG tablet  Take 180 mg by mouth daily as needed (seasonal allergies).   . hydrALAZINE (APRESOLINE) 50 MG tablet Take 1 tablet (50 mg total) by mouth 3 (three) times daily.  . metoprolol tartrate (LOPRESSOR) 100 MG tablet Take 1 tablet by mouth twice daily  . nitroGLYCERIN (NITROSTAT) 0.4 MG SL tablet DISSOLVE ONE TABLET UNDER THE TONGUE EVERY 5 MINUTES AS NEEDED FOR CHEST PAIN.  . sacubitril-valsartan (ENTRESTO) 97-103 MG Take 1 tablet by mouth 2 (two) times daily.  . shark liver oil-cocoa butter (PREPARATION H) 0.25-3-85.5 % suppository Place 1 suppository rectally 2 (two) times daily as needed for hemorrhoids.  . torsemide (DEMADEX) 20 MG tablet Take 20 mg by mouth daily as needed (swelling).   No facility-administered encounter medications on file as of 01/09/2021.    REVIEW OF SYSTEMS: Gen: + Fatigue. Denies fever, sweats or chills. No weight loss.  CV: See HPI. Resp: See HPI GI: See HPI GU : + Excessive urination. Denies urinary burning, blood in urine. MS: Denies joint pain, muscles aches or weakness. Derm: Denies rash, itchiness, skin lesions or unhealing ulcers. Psych: Denies depression, anxiety, memory loss, suicidal ideation and confusion. Heme: Denies bruising, bleeding. Neuro:  Denies headaches, dizziness or paresthesias. Endo:  Denies any problems with DM, thyroid or adrenal function.  PHYSICAL EXAM: BP (!) 150/110 (BP Location: Left Arm, Patient Position: Sitting)   Pulse 81   Ht 5' 10.5" (1.791 m)   Wt 253 lb (114.8 kg)   SpO2 98%   BMI 35.79 kg/m   Wt Readings from Last 3 Encounters:  01/09/21 253 lb (114.8 kg)  12/06/20 258 lb (117 kg)  11/08/20 252 lb (114.3 kg)   General: 55 year old obese male in no acute  distress. Head: Normocephalic and atraumatic. Eyes:  Sclerae non-icteric, conjunctive pink. Ears: Normal auditory acuity. Mouth: Dentition intact. No ulcers or lesions.  Neck: Supple, no lymphadenopathy or thyromegaly.  Lungs: Clear bilaterally to auscultation without wheezes, crackles or rhonchi. Heart: Distant S1,S2. Regular rate and rhythm. No murmur, rub or gallop appreciated.  Abdomen: Soft, nontender, non distended. No masses. No hepatosplenomegaly. Normoactive bowel sounds x 4 quadrants.  Rectal: Deferred.  Musculoskeletal: Symmetrical with no gross deformities. Skin: Warm and dry. No rash or lesions on visible extremities. Extremities: No edema. Neurological: Alert oriented x 4, no focal deficits.  Psychological:  Alert and cooperative. Normal mood and affect.  ASSESSMENT AND PLAN:  108.  55 year old male with remote history of GERD, s/p esophageal dilatation and questionable esophageal perforation in the late 1990's presents for further evaluation regarding chest pain/pressure with associated shortness of breath which occurs 1 to 2 times weekly for the past 3 months. He is followed closely by his cardiology team.  -Barium Swallow with tablet, rule out esophageal spasms, dysmotility and esophageal narrowing or mass. -Consider eventual EGD.  He will require cardiac clearance prior to pursuing any endoscopic evaluation -Recommend chest CT and pulmonary consult recommended. He is at higher risk for pulmonary malignancy ( + history of  tobacco use, father died from lung cancer). Rule out pulmonary disease versus pulmonary mass/malignancy (unlikely PE as patient is on Eliquis).  Defer chest CT/pulmonary consult to the patient's PCP and cardiology team. -CBC, CMP, TSH  2. History of CAD, MI, stent placement x 3, CHF, ischemic cardiomyopathy. LV EF 50 55% on Entresto.   3. Atrial fibrillation on Eliquis   4. Rectal bleeding, intermittent.  -Eventual screening colonoscopy. He will require  cardiac clearance prior to pursuing any endoscopic evaluation.  Patient to follow-up in the office with Dr. Rush Landmark after completing barium swallow.  Further consideration for endoscopic evaluation will be determined at that time.    CC:  [name], NP

## 2021-01-09 NOTE — Patient Instructions (Addendum)
If you are age 53 or younger, your body mass index should be between 19-25. Your Body mass index is 35.79 kg/m. If this is out of the aformentioned range listed, please consider follow up with your Primary Care Provider.   LABS:  Lab work has been ordered for you today. Our lab is located in the basement. Press "B" on the elevator. The lab is located at the first door on the left as you exit the elevator.  HEALTHCARE LAWS AND MY CHART RESULTS: Due to recent changes in healthcare laws, you may see the results of your imaging and laboratory studies on MyChart before your provider has had a chance to review them.   We understand that in some cases there may be results that are confusing or concerning to you. Not all laboratory results come back in the same time frame and the provider may be waiting for multiple results in order to interpret others.  Please give [place] 48 hours in order for your provider to thoroughly review all the results before contacting the office for clarification of your results.   You have been scheduled for a Barium Esophogram at [facility] (1st floor of the hospital) on 01/18/21 at 10:15AM. Please arrive 15 minutes prior to your appointment for registration. Make certain not to have anything to eat or drink 3 hours prior to your test. If you need to reschedule for any reason, please contact radiology at [phone] to do so. __________________________________________________________________ A barium swallow is an examination that concentrates on views of the esophagus. This tends to be a double contrast exam (barium and two liquids which, when combined, create a gas to distend the wall of the oesophagus) or single contrast (non-ionic iodine based). The study is usually tailored to your symptoms so a good history is essential. Attention is paid during the study to the form, structure and configuration of the esophagus, looking for functional disorders (such as aspiration,  dysphagia, achalasia, motility and reflux) EXAMINATION You may be asked to change into a gown, depending on the type of swallow being performed. A radiologist and radiographer will perform the procedure. The radiologist will advise you of the type of contrast selected for your procedure and direct you during the exam. You will be asked to stand, sit or lie in several different positions and to hold a small amount of fluid in your mouth before being asked to swallow while the imaging is performed .In some instances you may be asked to swallow barium coated marshmallows to assess the motility of a solid food bolus. The exam can be recorded as a digital or video fluoroscopy procedure. POST PROCEDURE It will take 1-2 days for the barium to pass through your system. To facilitate this, it is important, unless otherwise directed, to increase your fluids for the next 24-48hrs and to resume your normal diet.  This test typically takes about 30 minutes to perform. __________________________________________________________________________________  RECOMMENDATIONS:  Pulmonology consult and a chest CT scan, please check with your primary care provider.  Follow up in 6 weeks with Dr. Rush Landmark 02/20/21 at 2:50pm  It was great seeing you today! Thank you for entrusting me with your care and choosing [facility].  Noralyn Pick, CRNP

## 2021-01-10 LAB — TSH: TSH: 2.22 u[IU]/mL (ref 0.35–4.50)

## 2021-01-14 ENCOUNTER — Other Ambulatory Visit: Payer: Self-pay | Admitting: Student

## 2021-01-14 DIAGNOSIS — R0602 Shortness of breath: Secondary | ICD-10-CM

## 2021-01-14 NOTE — Progress Notes (Signed)
Attending Physician's Attestation   I have reviewed the chart.   I agree with the Advanced Practitioner's note, impression, and recommendations with any updates as below. Agree with conservative work-up with imaging (barium swallow) while obtaining cardiology approval for potential EGD/colonoscopy as well as anticoagulation hold.   Justice Britain, MD [facility] Advanced Endoscopy Office # [phone]

## 2021-01-18 ENCOUNTER — Ambulatory Visit (HOSPITAL_COMMUNITY): Admission: RE | Admit: 2021-01-18 | Payer: Medicare Other | Source: Ambulatory Visit

## 2021-01-18 ENCOUNTER — Encounter (HOSPITAL_COMMUNITY): Payer: Self-pay

## 2021-01-29 ENCOUNTER — Other Ambulatory Visit: Payer: Self-pay | Admitting: Cardiology

## 2021-01-29 DIAGNOSIS — I5041 Acute combined systolic (congestive) and diastolic (congestive) heart failure: Secondary | ICD-10-CM

## 2021-01-29 DIAGNOSIS — I4821 Permanent atrial fibrillation: Secondary | ICD-10-CM

## 2021-01-31 DIAGNOSIS — I2510 Atherosclerotic heart disease of native coronary artery without angina pectoris: Secondary | ICD-10-CM | POA: Diagnosis not present

## 2021-01-31 DIAGNOSIS — I4891 Unspecified atrial fibrillation: Secondary | ICD-10-CM | POA: Diagnosis not present

## 2021-01-31 DIAGNOSIS — I10 Essential (primary) hypertension: Secondary | ICD-10-CM | POA: Diagnosis not present

## 2021-02-02 DIAGNOSIS — I10 Essential (primary) hypertension: Secondary | ICD-10-CM | POA: Diagnosis not present

## 2021-02-08 ENCOUNTER — Ambulatory Visit (HOSPITAL_COMMUNITY)
Admission: RE | Admit: 2021-02-08 | Discharge: 2021-02-08 | Disposition: A | Discharge status: Home / Self Care | Payer: Medicare Other | Source: Ambulatory Visit | Attending: Nurse Practitioner | Admitting: Nurse Practitioner

## 2021-02-08 ENCOUNTER — Other Ambulatory Visit: Payer: Self-pay

## 2021-02-08 DIAGNOSIS — R079 Chest pain, unspecified: Secondary | ICD-10-CM | POA: Insufficient documentation

## 2021-02-08 DIAGNOSIS — I25118 Atherosclerotic heart disease of native coronary artery with other forms of angina pectoris: Secondary | ICD-10-CM | POA: Insufficient documentation

## 2021-02-08 DIAGNOSIS — R0602 Shortness of breath: Secondary | ICD-10-CM | POA: Diagnosis not present

## 2021-02-08 DIAGNOSIS — I4819 Other persistent atrial fibrillation: Secondary | ICD-10-CM | POA: Diagnosis not present

## 2021-02-08 DIAGNOSIS — K219 Gastro-esophageal reflux disease without esophagitis: Secondary | ICD-10-CM | POA: Diagnosis not present

## 2021-02-12 NOTE — Telephone Encounter (Signed)
Apparently we have not been getting his remote readings?

## 2021-02-13 ENCOUNTER — Other Ambulatory Visit: Payer: Self-pay

## 2021-02-13 MED ORDER — PANTOPRAZOLE SODIUM 20 MG PO TBEC: DELAYED_RELEASE_TABLET | ORAL | 1 refills | Status: DC

## 2021-02-20 ENCOUNTER — Other Ambulatory Visit: Payer: Self-pay

## 2021-02-20 ENCOUNTER — Ambulatory Visit (INDEPENDENT_AMBULATORY_CARE_PROVIDER_SITE_OTHER): Payer: Medicare Other | Admitting: Gastroenterology

## 2021-02-20 ENCOUNTER — Telehealth: Payer: Self-pay

## 2021-02-20 ENCOUNTER — Encounter: Payer: Self-pay | Admitting: Gastroenterology

## 2021-02-20 VITALS — BP 130/90 | HR 56 | Ht 67.75 in | Wt 249.4 lb

## 2021-02-20 DIAGNOSIS — Z7901 Long term (current) use of anticoagulants: Secondary | ICD-10-CM | POA: Diagnosis not present

## 2021-02-20 DIAGNOSIS — R0789 Other chest pain: Secondary | ICD-10-CM

## 2021-02-20 DIAGNOSIS — K219 Gastro-esophageal reflux disease without esophagitis: Secondary | ICD-10-CM

## 2021-02-20 DIAGNOSIS — Z1211 Encounter for screening for malignant neoplasm of colon: Secondary | ICD-10-CM

## 2021-02-20 DIAGNOSIS — K449 Diaphragmatic hernia without obstruction or gangrene: Secondary | ICD-10-CM

## 2021-02-20 DIAGNOSIS — R0602 Shortness of breath: Secondary | ICD-10-CM | POA: Diagnosis not present

## 2021-02-20 MED ORDER — SUPREP BOWEL PREP KIT 17.5-3.13-1.6 GM/177ML PO SOLN: 1.0000 | ORAL | 0 refills | Status: DC

## 2021-02-20 MED ORDER — PANTOPRAZOLE SODIUM 20 MG PO TBEC: 20.0000 mg | DELAYED_RELEASE_TABLET | Freq: Two times a day (BID) | ORAL | 0 refills | Status: DC

## 2021-02-20 NOTE — Patient Instructions (Addendum)
You have been scheduled for an endoscopy and colonoscopy. Please follow the written instructions given to you at your visit today. Please pick up your prep supplies at the pharmacy within the next 1-3 days. If you use inhalers (even only as needed), please bring them with you on the day of your procedure.   We have sent the following medications to your pharmacy for you to pick up at your convenience: Ken Caryl will be contaced by our office prior to your procedure for directions on holding your Elquis.  If you do not hear from our office 1 week prior to your scheduled procedure, please call [phone] to discuss.  You have an appointment with Dr. Ander Slade on 03/14/21 @ 3:00pm at [facility]: Address: [address], [place], [place] [address] Phone: [phone]  Increase your Pantoprazole to twice daily for the next 1-2 weeks. Let us know how your doing on twice daily dosing. Dr Rush Landmark will make a decision if we need to decrease back to once daily dosing.    Please keep follow up appt on : 04/03/21 2 2:30pm with Dr. Rush Landmark  If you are age 53 or older, your body mass index should be between 23-30. Your Body mass index is 38.2 kg/m. If this is out of the aforementioned range listed, please consider follow up with your Primary Care Provider.  If you are age 78 or younger, your body mass index should be between 19-25. Your Body mass index is 38.2 kg/m. If this is out of the aformentioned range listed, please consider follow up with your Primary Care Provider.   Thank you for choosing me and [facility].  Dr. Rush Landmark

## 2021-02-20 NOTE — Telephone Encounter (Signed)
Request for surgical clearance:     Endoscopy Procedure  What type of surgery is being performed?  Colon/Endo   When is this surgery scheduled?     04/26/21  What type of clearance is required ?   Pharmacy  Are there any medications that need to be held prior to surgery and how long? Eliquis x2 days   Practice name and name of physician performing surgery?      [facility]  What is your office phone and fax number?      Phone- [phone]  [phone]  Anesthesia type (None, local, MAC, general) ?       MAC

## 2021-02-21 NOTE — Telephone Encounter (Signed)
Clearance request sent to Dr. Nadyne Coombes.

## 2021-02-22 NOTE — Telephone Encounter (Signed)
Patient advised that he has been given clearance to hold Eliquis 2 days prior to procedure scheduled for 04-26-2021.  Patient advised to take last dose of Eliquis on 04-23-2021, and he will be advised when to restart Plavix by Dr Rush Landmark after the procedure.  Patient agreed to plan and verbalized understanding.  No further questions.

## 2021-02-22 NOTE — Telephone Encounter (Signed)
Please hold Eliquis for 2 days prior to procedure and restart same day if no  biopsy otherwise in 2-5 days. Low risk. JG

## 2021-02-24 ENCOUNTER — Encounter: Payer: Self-pay | Admitting: Gastroenterology

## 2021-02-24 DIAGNOSIS — Z1211 Encounter for screening for malignant neoplasm of colon: Secondary | ICD-10-CM | POA: Insufficient documentation

## 2021-02-24 DIAGNOSIS — Z7901 Long term (current) use of anticoagulants: Secondary | ICD-10-CM | POA: Insufficient documentation

## 2021-02-24 DIAGNOSIS — R0602 Shortness of breath: Secondary | ICD-10-CM | POA: Insufficient documentation

## 2021-02-24 DIAGNOSIS — K449 Diaphragmatic hernia without obstruction or gangrene: Secondary | ICD-10-CM | POA: Insufficient documentation

## 2021-02-24 DIAGNOSIS — R0789 Other chest pain: Secondary | ICD-10-CM | POA: Insufficient documentation

## 2021-02-24 DIAGNOSIS — K219 Gastro-esophageal reflux disease without esophagitis: Secondary | ICD-10-CM | POA: Insufficient documentation

## 2021-02-24 NOTE — Progress Notes (Signed)
GASTROENTEROLOGY OUTPATIENT CLINIC VISIT   Primary Care Provider Noni Saupe, MD [address] [place] [address] [phone]  Patient Profile: Danny Kelley is a 55 y.o. male with a pmh significant for CAD (status post PCI), atrial fibrillation (status post ablation on Eliquis), hypertension, hyperlipidemia, CHF, obesity, allergies, tobacco use, hiatal hernia, GERD.  The patient presents to the [facility] for an evaluation and management of problem(s) noted below:  Problem List 1. Gastroesophageal reflux disease, unspecified whether esophagitis present   2. Hiatal hernia   3. Atypical chest pain   4. Shortness of breath   5. Colon cancer screening   6. Chronic anticoagulation     History of Present Illness Please see initial consult note by NP [facility] for full details of HPI.  Interval History The patient returns for a scheduled follow-up.  He underwent a barium swallow which showed evidence of a hiatal hernia as well as reflux.  He was initiated on PPI therapy.  Patient states that his acid reflux has improved.  He still is having atypical chest discomfort as well as shortness of breath of an unclear etiology.  The treatment of his reflux has not made any difference in these particular symptoms.  Patient continues to take no significant nonsteroidals or BC/Goody powders.  He remains on Eliquis.  Feels ready to move forward with colon cancer screening.  Patient feels ready for endoscopic evaluation if he is felt to be safe from a cardiac perspective.  No new symptoms otherwise.  GI Review of Systems Positive as above Negative for dysphagia, odynophagia, nausea, vomiting, pain, change in bowel habits, melena, hematochezia  Review of Systems General: Denies fevers/chills/weight loss unintentionally HEENT: Denies oral lesions/sore throat Cardiovascular: Denies current chest pain/palpitations Pulmonary: Denies shortness of breath/nocturnal  cough Gastroenterological: See HPI Genitourinary: Denies darkened urine Hematological: Positive for history of easy bruising/bleeding due to anticoagulation Dermatological: Denies jaundice Psychological: Mood is stable   Medications Current Outpatient Medications  Medication Sig Dispense Refill  . acetaminophen (TYLENOL) 500 MG tablet Take 500 mg by mouth 2 (two) times daily as needed for moderate pain.    Marland Kitchen amLODipine (NORVASC) 10 MG tablet Take 1 tablet (10 mg total) by mouth daily. 90 tablet 1  . bisacodyl (DULCOLAX) 5 MG EC tablet Take 5 mg by mouth daily as needed.     . diltiazem (CARDIZEM) 30 MG tablet Take 1 tablet every 4 hours AS NEEDED for heart rate >110 45 tablet 1  . docusate sodium (COLACE) 100 MG capsule Take 100 mg by mouth 2 (two) times daily as needed.     Marland Kitchen ELIQUIS 5 MG TABS tablet Take 1 tablet by mouth twice daily 60 tablet 0  . Evolocumab (REPATHA) 140 MG/ML SOSY Inject 1 mL into the skin every 14 (fourteen) days. 2 mL 3  . ezetimibe (ZETIA) 10 MG tablet Take 1 tablet (10 mg total) by mouth daily. 30 tablet 12  . fexofenadine (ALLEGRA) 180 MG tablet Take 180 mg by mouth daily as needed (seasonal allergies).     . hydrALAZINE (APRESOLINE) 50 MG tablet Take 1 tablet (50 mg total) by mouth 3 (three) times daily. 90 tablet 2  . metoprolol tartrate (LOPRESSOR) 100 MG tablet Take 1 tablet by mouth twice daily 180 tablet 0  . Na Sulfate-K Sulfate-Mg Sulf (SUPREP BOWEL PREP KIT) 17.5-3.13-1.6 GM/177ML SOLN Take 1 kit by mouth as directed. For colonoscopy prep 354 mL 0  . nitroGLYCERIN (NITROSTAT) 0.4 MG SL tablet  DISSOLVE ONE TABLET UNDER THE TONGUE EVERY 5 MINUTES AS NEEDED FOR CHEST PAIN. 25 tablet 0  . pantoprazole (PROTONIX) 20 MG tablet Take 1 tablet (20 mg total) by mouth 2 (two) times daily. 60 tablet 0  . sacubitril-valsartan (ENTRESTO) 97-103 MG Take 1 tablet by mouth 2 (two) times daily. 60 tablet 3  . shark liver oil-cocoa butter (PREPARATION H) 0.25-3-85.5 %  suppository Place 1 suppository rectally 2 (two) times daily as needed for hemorrhoids.    . torsemide (DEMADEX) 20 MG tablet Take 20 mg by mouth daily as needed (swelling).     No current facility-administered medications for this visit.    Allergies Allergies  Allergen Reactions  . Codeine Anaphylaxis  . Crestor [Rosuvastatin] Other (See Comments)    Severe leg cramps  . Dilaudid [Hydromorphone] Itching and Swelling  . Isosorbide     Muscle pain and cramps  . Lexapro [Escitalopram]     Have bad thoughts made anxiety worse   . Lipitor [Atorvastatin Calcium] Other (See Comments)    myalgias  . Erythromycin Other (See Comments)    Hallucinations   . Oxycodone Itching and Rash    Swelling to face, hands, mouth Completley intolerant to any meds with oxycodone in it     Histories Past Medical History:  Diagnosis Date  . Arthritis    "a little in my right knee" (07/14/2016)  . CAD (coronary artery disease)   . Chronic congestive heart failure with left ventricular diastolic dysfunction ([place])   . Complication of anesthesia    Pt reports "they have a hard time waking me up"  . GERD (gastroesophageal reflux disease)    hx  . Headache    "with every heart issue that I have" (07/14/2016)  . High cholesterol   . History of hiatal hernia 1990s   "fixed it w/scope down my throat"  . Hypertension   . Myocardial infarction [facility]) 05/2013   stents: left PDA, 1st OM at [facility]  . Obesity   . Persistent atrial fibrillation ([place])   . Pneumonia ~ 1992  . QT prolongation 07/15/2016  . Seasonal allergies    "I take Allegra prn" (07/14/2016)  . Tobacco abuse 12/16/2016   Past Surgical History:  Procedure Laterality Date  . APPENDECTOMY  1984  . ATRIAL FIBRILLATION ABLATION N/A 03/01/2020   Procedure: ATRIAL FIBRILLATION ABLATION;  Surgeon: [name], MD;  Location: [place] CV LAB;  Service: Cardiovascular;  Laterality: N/A;  . CARDIOVERSION N/A 05/27/2016   Procedure:  CARDIOVERSION;  Surgeon: [name], MD;  Location[facility] ENDOSCOPY;  Service: Cardiovascular;  Laterality: N/A;  . CARDIOVERSION N/A 06/11/2016   Procedure: CARDIOVERSION;  Surgeon: [name], MD;  Location: [facility] ENDOSCOPY;  Service: Cardiovascular;  Laterality: N/A;  . CARDIOVERSION N/A 03/21/2020   Procedure: CARDIOVERSION;  Surgeon: Geralynn Rile, MD;  Location: [place];  Service: Cardiovascular;  Laterality: N/A;  . CORONARY ANGIOPLASTY WITH STENT PLACEMENT  05/30/2013   "2 stents put in at [facility]" & /stent card  . CORONARY STENT INTERVENTION N/A 11/27/2017   Procedure: CORONARY STENT INTERVENTION;  Surgeon: [name], MD;  Location: [place] CV LAB;  Service: Cardiovascular;  Laterality: N/A;  . CORONARY STENT INTERVENTION N/A 03/21/2019   Procedure: CORONARY STENT INTERVENTION;  Surgeon: [name], MD;  Location: [place] [facility];  Service: Cardiovascular;  Laterality: N/A;  . ELECTROPHYSIOLOGIC STUDY N/A 11/13/2016   Procedure: Atrial Fibrillation Ablation;  Surgeon: [name], MD;  Location: [place] CV  LAB;  Service: Cardiovascular;  Laterality: N/A;  . ESOPHAGOGASTRODUODENOSCOPY (EGD) WITH ESOPHAGEAL DILATION  1990s X 2  . INTRAVASCULAR PRESSURE WIRE/FFR STUDY N/A 09/21/2018   Procedure: INTRAVASCULAR PRESSURE WIRE/FFR STUDY;  Surgeon: [name], MD;  Location: [place] [facility];  Service: Cardiovascular;  Laterality: N/A;  . INTRAVASCULAR PRESSURE WIRE/FFR STUDY N/A 03/21/2019   Procedure: INTRAVASCULAR PRESSURE WIRE/FFR STUDY;  Surgeon: [name], MD;  Location: [place] [facility] LAB;  Service: Cardiovascular;  Laterality: N/A;  . INTRAVASCULAR PRESSURE WIRE/FFR STUDY N/A 01/24/2020   Procedure: INTRAVASCULAR PRESSURE WIRE/FFR STUDY;  Surgeon: [name], MD;  Location: [place] [facility];  Service: Cardiovascular;  Laterality: N/A;  . KNEE ARTHROSCOPY Right 1986; ~ 1995 X 2  . LAPAROSCOPIC CHOLECYSTECTOMY  2015  . LEFT HEART CATH AND CORONARY ANGIOGRAPHY N/A  11/27/2017   Procedure: LEFT HEART CATH AND CORONARY ANGIOGRAPHY;  Surgeon: [name], MD;  Location: [place] [facility];  Service: Cardiovascular;  Laterality: N/A;  . LEFT HEART CATH AND CORONARY ANGIOGRAPHY N/A 09/21/2018   Procedure: LEFT HEART CATH AND CORONARY ANGIOGRAPHY;  Surgeon: [name], MD;  Location: [place] [facility];  Service: Cardiovascular;  Laterality: N/A;  . LEFT HEART CATH AND CORONARY ANGIOGRAPHY N/A 03/21/2019   Procedure: LEFT HEART CATH AND CORONARY ANGIOGRAPHY;  Surgeon: [name], MD;  Location: [place] [facility];  Service: Cardiovascular;  Laterality: N/A;  . REPAIR OF ESOPHAGUS  1998   perforation of the distal esophagus from bad heartburn  . RIGHT/LEFT HEART CATH AND CORONARY ANGIOGRAPHY N/A 01/24/2020   Procedure: RIGHT/LEFT HEART CATH AND CORONARY ANGIOGRAPHY;  Surgeon: [name], MD;  Location: [place] [facility];  Service: Cardiovascular;  Laterality: N/A;  . TEE WITHOUT CARDIOVERSION N/A 11/13/2016   Procedure: TRANSESOPHAGEAL ECHOCARDIOGRAM (TEE);  Surgeon: Thayer Headings, MD;  Location[facility] ENDOSCOPY;  Service: Cardiovascular;  Laterality: N/A;   Social History   Socioeconomic History  . Marital status: Married    Spouse name: Not on file  . Number of children: 2  . Years of education: Not on file  . Highest education level: Not on file  Occupational History  . Not on file  Tobacco Use  . Smoking status: Former Smoker    Years: 25.00    Types: Cigarettes, E-cigarettes    Quit date: 01/15/2020    Years since quitting: 1.1  . Smokeless tobacco: Never Used  . Tobacco comment: Stopped smoking Sept 3, 2021  Vaping Use  . Vaping Use: Never used  Substance and Sexual Activity  . Alcohol use: No  . Drug use: No  . Sexual activity: Yes  Other Topics Concern  . Not on file  Social History Narrative   Pt lives in [place] with spouse and 19 year old son.   disabled   Social Determinants of Radio broadcast assistant Strain: Not on file  Food  Insecurity: Not on file  Transportation Needs: Not on file  Physical Activity: Not on file  Stress: Not on file  Social Connections: Not on file  Intimate Partner Violence: Not on file   Family History  Problem Relation Age of Onset  . Heart disease Mother 1       CABG age 18  . Breast cancer Mother   . Tongue cancer Mother   . Diabetes Mother   . Heart attack Father 32       Father had around 7 heart attacks per pt  . Diabetes Father   . Lung cancer  Father   . Congestive Heart Failure Father   . Lung cancer Paternal Uncle   . Colon cancer Neg Hx   . Esophageal cancer Neg Hx   . Inflammatory bowel disease Neg Hx   . Liver disease Neg Hx   . Pancreatic cancer Neg Hx   . Rectal cancer Neg Hx   . Stomach cancer Neg Hx    I have reviewed his medical, social, and family history in detail and updated the electronic medical record as necessary.    PHYSICAL EXAMINATION  BP 130/90 (BP Location: Left Arm, Patient Position: Sitting, Cuff Size: Large)   Pulse (!) 56 Comment: irregular  Ht 5' 7.75" (1.721 m) Comment: height measured without shoes  Wt 249 lb 6 oz (113.1 kg)   BMI 38.20 kg/m  Wt Readings from Last 3 Encounters:  02/20/21 249 lb 6 oz (113.1 kg)  01/09/21 253 lb (114.8 kg)  12/06/20 258 lb (117 kg)  GEN: NAD, appears stated age, doesn't appear chronically ill PSYCH: Cooperative, without pressured speech EYE: Conjunctivae pink, sclerae anicteric ENT: Masked CV: Nontachycardic RESP: No audible wheezing GI: NABS, rounded, obese, nontender, without rebound or guarding MSK/EXT: Lower extremity edema present SKIN: No jaundice NEURO:  Alert & Oriented x 3, no focal deficits   REVIEW OF DATA  I reviewed the following data at the time of this encounter:  GI Procedures and Studies  No relevant studies to review  Laboratory Studies  Reviewed those in epic  Imaging Studies  April 2022 barium swallow IMPRESSION: Hiatal hernia with moderate gastroesophageal  reflux.   ASSESSMENT  Mr. Lundberg is a 55 y.o. male with a pmh significant for CAD (status post PCI), atrial fibrillation (status post ablation on Eliquis), hypertension, hyperlipidemia, CHF, obesity, allergies, tobacco use, hiatal hernia, GERD.  The patient is seen today for evaluation and management of:  1. Gastroesophageal reflux disease, unspecified whether esophagitis present   2. Hiatal hernia   3. Atypical chest pain   4. Shortness of breath   5. Colon cancer screening   6. Chronic anticoagulation    The patient is hemodynamically stable.  Clinically, he has had some improvement though continues to have atypical chest pain and shortness of breath of an unclear etiology.  Patient has not undergone pulmonary referral as of yet even though the referral was placed at time of last clinic visit.  Going to work on trying to find out more about this.  I think the patient is ready for endoscopic evaluation to try and discern that his atypical chest pain and shortness of breath is not related to acid reflux changes even though he has had some improvement with PPI therapy.  We will optimize and go up to twice daily PPI in the interim.  If patient continues to have issues can consider esophageal manometry and pH impedance for atypical chest discomfort but seems less likely and we must ensure that his lung status is safe for endoscopic evaluation with anesthesia.  We will move forward with scheduling a follow-up in clinic as well as an upper and lower endoscopy a few weeks after our clinic visit with the hope that pulmonary optimization has occurred by that time.  It is not clear to me that his hiatal hernia is causing him significant issues but we will get a better discernment of that at the time of his endoscopy.  We will obtain approval from cardiology service to hold his Eliquis for 1 to 2 days prior to procedure.  The  risks and benefits of endoscopic evaluation were discussed with the patient; these  include but are not limited to the risk of perforation, infection, bleeding, missed lesions, lack of diagnosis, severe illness requiring hospitalization, as well as anesthesia and sedation related illnesses.  The patient is agreeable to proceed.  All patient questions were answered to the best of my ability, and the patient agrees to the aforementioned plan of action with follow-up as indicated.   PLAN  Increase Protonix from 20 mg daily to 20 mg twice daily and evaluate symptoms Follow-up on pulmonary referral Follow-up in clinic in 6 weeks (after pulmonary referral) Tentative EGD/colonoscopy scheduled for 8 weeks from now as long as everything looks good from a pulmonary perspective in regards to optimization - Obtain approval for Eliquis hold for 1 to 2 days prior to procedure   Orders Placed This Encounter  Procedures  . Ambulatory referral to Gastroenterology    New Prescriptions   NA SULFATE-K SULFATE-MG SULF (SUPREP BOWEL PREP KIT) 17.5-3.13-1.6 GM/177ML SOLN    Take 1 kit by mouth as directed. For colonoscopy prep   PANTOPRAZOLE (PROTONIX) 20 MG TABLET    Take 1 tablet (20 mg total) by mouth 2 (two) times daily.   Modified Medications   No medications on file    Planned Follow Up No follow-ups on file.   Total Time in Face-to-Face and in Coordination of Care for patient including independent/personal interpretation/review of prior testing, medical history, examination, medication adjustment, communicating results with the patient directly, and documentation with the EHR is 25 minutes   Justice Britain, MD [facility] Gastroenterology Advanced Endoscopy Office # [phone]

## 2021-02-25 ENCOUNTER — Ambulatory Visit: Payer: Medicare Other | Admitting: Internal Medicine

## 2021-02-25 DIAGNOSIS — I2510 Atherosclerotic heart disease of native coronary artery without angina pectoris: Secondary | ICD-10-CM

## 2021-02-25 DIAGNOSIS — I10 Essential (primary) hypertension: Secondary | ICD-10-CM

## 2021-02-25 DIAGNOSIS — I4819 Other persistent atrial fibrillation: Secondary | ICD-10-CM

## 2021-02-25 DIAGNOSIS — I428 Other cardiomyopathies: Secondary | ICD-10-CM

## 2021-02-28 ENCOUNTER — Other Ambulatory Visit: Payer: Self-pay

## 2021-02-28 DIAGNOSIS — R5383 Other fatigue: Secondary | ICD-10-CM | POA: Diagnosis not present

## 2021-02-28 DIAGNOSIS — R0600 Dyspnea, unspecified: Secondary | ICD-10-CM | POA: Diagnosis not present

## 2021-02-28 DIAGNOSIS — I2510 Atherosclerotic heart disease of native coronary artery without angina pectoris: Secondary | ICD-10-CM | POA: Diagnosis not present

## 2021-02-28 MED ORDER — NITROGLYCERIN 0.4 MG SL SUBL: 0.4000 mg | SUBLINGUAL_TABLET | SUBLINGUAL | 2 refills | Status: DC | PRN

## 2021-02-28 NOTE — Telephone Encounter (Signed)
From pt

## 2021-03-01 MED ORDER — PANTOPRAZOLE SODIUM 20 MG PO TBEC
20.0000 mg | DELAYED_RELEASE_TABLET | Freq: Two times a day (BID) | ORAL | 0 refills | Status: DC
Start: 2021-03-01 — End: 2021-03-15

## 2021-03-01 NOTE — Telephone Encounter (Signed)
Attempt to call to discuss with pt. LVM for pt to call back. Will try to call around 2 pm to review recent BP readings

## 2021-03-04 DIAGNOSIS — I5042 Chronic combined systolic (congestive) and diastolic (congestive) heart failure: Secondary | ICD-10-CM | POA: Diagnosis not present

## 2021-03-04 DIAGNOSIS — R0789 Other chest pain: Secondary | ICD-10-CM | POA: Diagnosis not present

## 2021-03-04 DIAGNOSIS — E7800 Pure hypercholesterolemia, unspecified: Secondary | ICD-10-CM | POA: Diagnosis not present

## 2021-03-04 DIAGNOSIS — R0602 Shortness of breath: Secondary | ICD-10-CM | POA: Diagnosis not present

## 2021-03-04 DIAGNOSIS — I10 Essential (primary) hypertension: Secondary | ICD-10-CM | POA: Diagnosis not present

## 2021-03-04 DIAGNOSIS — R072 Precordial pain: Secondary | ICD-10-CM | POA: Diagnosis not present

## 2021-03-04 NOTE — Progress Notes (Signed)
Primary Physician/Referring:  Angelina Sheriff, MD  Patient ID: Danny Kelley, male    DOB: May 25, 1966, 55 y.o.   MRN: [id number]  Chief Complaint  Patient presents with  . Coronary Artery Disease  . Follow-up  . Hypertension  . Chest Pain   HPI:    Danny Kelley  is a 55 y.o. Caucasian male patient with history of cardiomyopathy with chronic systolic heart failure, hypertension, hyperlipidemia (statin intolerant), obstructive sleep apnea compliant with CPAP, tobacco use.  Patient has history of TIA in Feb 2018 while on anticoagulants, paroxysmal atrial fibrillation status post ablation 11/13/2016 and 03/01/2020 (Multaq and sotalol ineffective).  History of unstable angina pectoris on 11/26/2017, angioplasty to superdominant left circumflex coronary artery with 4.5 mm resolute DES in the midsegment, patent stent placed in Distal Cx and OM 1 in 2014, 03/22/2019, stenting to high-grade stenosis in the distal dominant Circumflex PDA branch. Cardiac catheterization on 01/24/2020 revealed no change in coronary anatomy, suspect his cardiomyopathy may have been related to underlying A. Fib.   Of note patient has previously been evaluated for syncope, which was felt to be psychogenic in view of his underlying history of seizure disorder. Patient is enrolled in remote patient monitoring for hypertension.  Patient has been unable to tolerate statin therapy in the past due to severe myalgias including atorvastatin, rosuvastatin, and simvastatin.  Patient presents for 53-monthfollow-up of CAD, hypertension, hyperlipidemia, and heart failure.  At last visit started patient on Repatha as well as referred to GI and pulmonary for further evaluation of dyspnea and chest pain as cardiac work-up has been relatively unyielding.  GI evaluation thus far has revealed GERD and hiatal hernia, for which pantoprazole has been increased. Patient is presently scheduled to undergo endoscopy later this month.  He is also seen  pulmonology on 03/14/2021 for evaluation of shortness of breath.  Patient notes that his present symptoms of palpitations, chest discomfort, and shortness of breath are unchanged since prior visit.  He does note that his PCP recently started him on an albuterol inhaler which has certainly improved his shortness of breath.  Patient continues to walk on a treadmill daily for 20 to 30 minutes.  Patient recently missed an appointment for follow-up with Dr. ARayann Heman which he plans to reschedule.  He also recently had chest x-ray done on 02/28/2021 by PCP, which patient reports was normal however no results or imaging available for my personal review.  Past Medical History:  Diagnosis Date  . Arthritis    "a little in my right knee" (07/14/2016)  . CAD (coronary artery disease)   . Chronic congestive heart failure with [place] ventricular diastolic dysfunction [place]   . Complication of anesthesia    Pt reports "they have a hard time waking me up"  . GERD (gastroesophageal reflux disease)    hx  . Headache    "with every heart issue that I have" (07/14/2016)  . High cholesterol   . History of hiatal hernia 1990s   "fixed it w/scope down my throat"  . Hypertension   . Myocardial infarction ([facility] 05/2013   [facility] . Ob[facility]   . [place]istent [place]   . Pneumonia ~ 1992  . QT prolongation 07/15/2016  . Seasonal allergies    "I take Allegra prn" (07/14/2016)  . Tobacco abuse 12/16/2016   Past Surgical History:  Procedure Laterality Date  . APPENDECTOMY  1984  . ATRIAL FIBRILLATION ABLATION  N/A 03/01/2020   Procedure: ATRIAL FIBRILLATION ABLATION;  Surgeon: [name], MD;  Location: [place] CV LAB;  Service: Cardiovascular;  Laterality: N/A;  . CARDIOVERSION N/A 05/27/2016   Procedure: CARDIOVERSION;  Surgeon: [name], MD;  Location: [facility] ENDOSCOPY;  Service: Cardiovascular;  Laterality: N/A;  . CARDIOVERSION N/A 06/11/2016   Procedure:  CARDIOVERSION;  Surgeon: [name], MD;  Location: [facility] ENDOSCOPY;  Service: Cardiovascular;  Laterality: N/A;  . CARDIOVERSION N/A 03/21/2020   Procedure: CARDIOVERSION;  Surgeon: Geralynn Rile, MD;  Location: [place];  Service: Cardiovascular;  Laterality: N/A;  . CORONARY ANGIOPLASTY WITH STENT PLACEMENT  05/30/2013   "2 stents put in at [facility]" & /stent card  . CORONARY STENT INTERVENTION N/A 11/27/2017   Procedure: CORONARY STENT INTERVENTION;  Surgeon: [name], MD;  Location: [place] CV LAB;  Service: Cardiovascular;  Laterality: N/A;  . CORONARY STENT INTERVENTION N/A 03/21/2019   Procedure: CORONARY STENT INTERVENTION;  Surgeon: [name], MD;  Location: [place] [facility] LAB;  Service: Cardiovascular;  Laterality: N/A;  . ELECTROPHYSIOLOGIC STUDY N/A 11/13/2016   Procedure: Atrial Fibrillation Ablation;  Surgeon: [name], MD;  Location: [place] CV LAB;  Service: Cardiovascular;  Laterality: N/A;  . ESOPHAGOGASTRODUODENOSCOPY (EGD) WITH ESOPHAGEAL DILATION  1990s X 2  . INTRAVASCULAR PRESSURE WIRE/FFR STUDY N/A 09/21/2018   Procedure: INTRAVASCULAR PRESSURE WIRE/FFR STUDY;  Surgeon: [name], MD;  Location: [place] CV LAB;  Service: Cardiovascular;  Laterality: N/A;  . INTRAVASCULAR PRESSURE WIRE/FFR STUDY N/A 03/21/2019   Procedure: INTRAVASCULAR PRESSURE WIRE/FFR STUDY;  Surgeon: [name], MD;  Location: [place] CV LAB;  Service: Cardiovascular;  Laterality: N/A;  . INTRAVASCULAR PRESSURE WIRE/FFR STUDY N/A 01/24/2020   Procedure: INTRAVASCULAR PRESSURE WIRE/FFR STUDY;  Surgeon: [name], MD;  Location: [place] CV LAB;  Service: Cardiovascular;  Laterality: N/A;  . KNEE ARTHROSCOPY Right 1986; ~ 1995 X 2  . LAPAROSCOPIC CHOLECYSTECTOMY  2015  . LEFT HEART CATH AND CORONARY ANGIOGRAPHY N/A 11/27/2017   Procedure: LEFT HEART CATH AND CORONARY ANGIOGRAPHY;  Surgeon: [name], MD;  Location: [place] CV LAB;  Service: Cardiovascular;   Laterality: N/A;  . LEFT HEART CATH AND CORONARY ANGIOGRAPHY N/A 09/21/2018   Procedure: LEFT HEART CATH AND CORONARY ANGIOGRAPHY;  Surgeon: [name], MD;  Location: [place] CV LAB;  Service: Cardiovascular;  Laterality: N/A;  . LEFT HEART CATH AND CORONARY ANGIOGRAPHY N/A 03/21/2019   Procedure: LEFT HEART CATH AND CORONARY ANGIOGRAPHY;  Surgeon: [name], MD;  Location: [place] CV LAB;  Service: Cardiovascular;  Laterality: N/A;  . REPAIR OF ESOPHAGUS  1998   perforation of the distal esophagus from bad heartburn  . RIGHT/LEFT HEART CATH AND CORONARY ANGIOGRAPHY N/A 01/24/2020   Procedure: RIGHT/LEFT HEART CATH AND CORONARY ANGIOGRAPHY;  Surgeon: [name], MD;  Location: [place] CV LAB;  Service: Cardiovascular;  Laterality: N/A;  . TEE WITHOUT CARDIOVERSION N/A 11/13/2016   Procedure: TRANSESOPHAGEAL ECHOCARDIOGRAM (TEE);  Surgeon: Thayer Headings, MD;  Location: [facility] ENDOSCOPY;  Service: Cardiovascular;  Laterality: N/A;   Family History  Problem Relation Age of Onset  . Heart disease Mother 48       CABG age 75  . Breast cancer Mother   . Tongue cancer Mother   . Diabetes Mother   . Heart attack Father 17       Father had around 7 heart attacks per pt  . Diabetes Father   . Lung cancer Father   . Congestive Heart  Failure Father   . Lung cancer Paternal Uncle   . Colon cancer Neg Hx   . Esophageal cancer Neg Hx   . Inflammatory bowel disease Neg Hx   . Liver disease Neg Hx   . Pancreatic cancer Neg Hx   . Rectal cancer Neg Hx   . Stomach cancer Neg Hx     Social History   Tobacco Use  . Smoking status: Former Smoker    Years: 25.00    Types: Cigarettes, E-cigarettes    Quit date: 01/15/2020    Years since quitting: 1.1  . Smokeless tobacco: Never Used  . Tobacco comment: Stopped smoking Sept 3, 2021  Substance Use Topics  . Alcohol use: No   ROS  Review of Systems  Constitutional: Positive for malaise/fatigue and weight gain. Negative for decreased  appetite and weight loss.  Cardiovascular: Positive for chest pain (pressure) and dyspnea on exertion. Negative for claudication, leg swelling, near-syncope, orthopnea, palpitations, paroxysmal nocturnal dyspnea and syncope.  Respiratory: Negative for shortness of breath.   Hematologic/Lymphatic: Does not bruise/bleed easily.  Gastrointestinal: Negative for abdominal pain and melena.  Neurological: Negative for dizziness, headaches and weakness.  Psychiatric/Behavioral: Negative for suicidal ideas.  All other systems reviewed and are negative.  Objective  Blood pressure 140/82, pulse (!) 50, temperature 98.6 F (37 C), height 5' 7.5" (1.715 m), weight 253 lb (114.8 kg), SpO2 98 %.  Vitals with BMI 03/05/2021 03/05/2021 02/20/2021  Height - 5' 7.5" 5' 7.75"  Weight - 253 lbs 249 lbs 6 oz  BMI - 35.32 99.24  Systolic 268 341 962  Diastolic 82 70 90  Pulse 50 62 56     Physical Exam Vitals reviewed.  Constitutional:      Comments: Moderately obese  HENT:     Head: Normocephalic and atraumatic.  Cardiovascular:     Rate and Rhythm: Regular rhythm. Bradycardia present. Frequent extrasystoles are present.    Pulses: Intact distal pulses.     Heart sounds: Normal heart sounds, S1 normal and S2 normal. No murmur heard. No gallop.      Comments: No leg edema, no JVD. Pulmonary:     Effort: Pulmonary effort is normal. No accessory muscle usage or respiratory distress.     Breath sounds: Normal breath sounds. No wheezing, rhonchi or rales.  Musculoskeletal:        General: Normal range of motion.     Right lower leg: No edema.     Left lower leg: No edema.  Neurological:     Mental Status: He is alert.    Laboratory examination:   Recent Labs    03/12/20 1512 12/10/20 1323 01/09/21 1528  NA 141 135 137  K 4.0 4.2 3.6  CL 108 98 101  CO2 '25 25 28  ' GLUCOSE 232* 155* 128*  BUN '17 16 13  ' CREATININE 0.95 0.91 1.07  CALCIUM 8.9 8.7 9.1  GFRNONAA >60 97  --   GFRAA >60 112  --     CrCl cannot be calculated (Patient's most recent lab result is older than the maximum 21 days allowed.).  CMP Latest Ref Rng & Units 01/09/2021 12/10/2020 03/12/2020  Glucose 70 - 99 mg/dL 128(H) 155(H) 232(H)  BUN 6 - 23 mg/dL '13 16 17  ' Creatinine 0.40 - 1.50 mg/dL 1.07 0.91 0.95  Sodium 135 - 145 mEq/L 137 135 141  Potassium 3.5 - 5.1 mEq/L 3.6 4.2 4.0  Chloride 96 - 112 mEq/L 101 98 108  CO2 19 -  32 mEq/L '28 25 25  ' Calcium 8.4 - 10.5 mg/dL 9.1 8.7 8.9  Total Protein 6.0 - 8.3 g/dL 7.5 - -  Total Bilirubin 0.2 - 1.2 mg/dL 0.4 - -  Alkaline Phos 39 - 117 U/L 111 - -  AST 0 - 37 U/L 14 - -  ALT 0 - 53 U/L 21 - -   CBC Latest Ref Rng & Units 01/09/2021 03/12/2020 02/01/2020  WBC 4.0 - 10.5 K/uL 10.3 11.1(H) 9.6  Hemoglobin 13.0 - 17.0 g/dL 16.9 16.6 17.6(H)  Hematocrit 39.0 - 52.0 % 48.2 49.7 51.5  Platelets 150.0 - 400.0 K/uL 279.0 254 259   Lipid Panel     Component Value Date/Time   CHOL 239 (H) 01/02/2020 1445   TRIG 143 01/02/2020 1445   HDL 62 01/02/2020 1445   CHOLHDL 5.0 11/27/2017 0634   VLDL 16 11/27/2017 0634   LDLCALC 152 (H) 01/02/2020 1445   HEMOGLOBIN A1C Lab Results  Component Value Date   HGBA1C 6.0 (H) 11/27/2017   MPG 125.5 11/27/2017   TSH Recent Labs    01/09/21 1528  TSH 2.22   BNP No results found for: BNP  ProBNP    Component Value Date/Time   PROBNP 74 12/10/2020 1323   External Labs:  02/28/2021:  TSH 1.769    Medications and allergies   Allergies  Allergen Reactions  . Codeine Anaphylaxis  . Crestor [Rosuvastatin] Other (See Comments)    Severe leg cramps  . Dilaudid [Hydromorphone] Itching and Swelling  . Isosorbide     Muscle pain and cramps  . Lexapro [Escitalopram]     Have bad thoughts made anxiety worse   . Lipitor [Atorvastatin Calcium] Other (See Comments)    myalgias  . Erythromycin Other (See Comments)    Hallucinations   . Oxycodone Itching and Rash    Swelling to face, hands, mouth Completley intolerant to any  meds with oxycodone in it      Current Outpatient Medications  Medication Instructions  . acetaminophen (TYLENOL) 500 mg, Oral, 2 times daily PRN  . amLODipine (NORVASC) 10 mg, Oral, Daily  . bisacodyl (DULCOLAX) 5 mg, Oral, Daily PRN  . diltiazem (CARDIZEM) 30 MG tablet Take 1 tablet every 4 hours AS NEEDED for heart rate >110  . docusate sodium (COLACE) 100 mg, Oral, 2 times daily PRN  . Eliquis 5 mg, Oral, 2 times daily  . Evolocumab (REPATHA) 140 MG/ML SOSY 1 mL, Subcutaneous, Every 14 days  . ezetimibe (ZETIA) 10 mg, Oral, Daily  . fexofenadine (ALLEGRA) 180 mg, Oral, Daily PRN  . hydrALAZINE (APRESOLINE) 50 mg, Oral, 3 times daily  . metoprolol tartrate (LOPRESSOR) 100 MG tablet Take 1 tablet by mouth twice daily  . Na Sulfate-K Sulfate-Mg Sulf (SUPREP BOWEL PREP KIT) 17.5-3.13-1.6 GM/177ML SOLN 1 kit, Oral, As directed, For colonoscopy prep  . nitroGLYCERIN (NITROSTAT) 0.4 mg, Sublingual, Every 5 min PRN  . pantoprazole (PROTONIX) 20 mg, Oral, 2 times daily  . sacubitril-valsartan (ENTRESTO) 97-103 MG 1 tablet, Oral, 2 times daily  . shark liver oil-cocoa butter (PREPARATION H) 0.25-3-85.5 % suppository 1 suppository, Rectal, 2 times daily PRN  . torsemide (DEMADEX) 20 mg, Oral, Daily PRN    Radiology:   No results found.  Cardiac Studies:    PCV ECHOCARDIOGRAM COMPLETE 11/15/2020 Study Quality: Technically difficult study. Normal LV systolic function with visual EF 50-55%. Left ventricle cavity is normal in size. Normal left ventricular wall thickness. Normal global wall motion. Normal diastolic filling pattern, normal  LAP. Insignificant pericardial effusion. There is no hemodynamic significance. No significant valvular heart disease. Compared to prior study dated 12/28/2019: LVEF improved from 25-30% to 50-55% otherwise no significant change.  14-day cardiac event monitor 08/21/2020: Sinus rhythm Premature atrial contractions and premature ventricular contractions are  noted Rare nonsustained ventricular tachycardia No sustained arrhythmias  Left and right heart catheterization 01/24/2020: Right heart: RA 18/18, mean 17; RV 44/10, EDP 15; PA 48/19, mean 33.  PA saturation 72%.  PW 25/26, mean 23 mmHg.  Aortic saturation 98%. Cardiac output 4.73, cardiac index 2.09, reduced.  QP/QS 1.00. Impression: Mild decrease in cardiac output and cardiac index.  Mild to moderate pulmonary hypertension related to elevated LVEDP.  Left heart catheterization: LV pressure 139/17, EDP 31 mmHg.  Aortic pressure 144/97, mean 122 mmHg.  No pressure gradient. Right coronary artery is nondominant but a large vessel with a proximal 70% stenosis unchanged from cardiac catheterization on 03/21/2019. Left main is large.  It trifurcates. Circumflex: Dominant vessel.  Previously placed stent in the proximal to mid circumflex, OM1 and also PDA widely patent. Ramus intermediate: Large vessel, mild disease. LAD: Large vessel, diffuse 60 to 70% in some views appears to be almost 80% proximal long segment stenosis.  DFR 0.91, FFR 0.89,  not hemodynamically significant.  Lesion slightly progressed compared to prior angiography in 2020.   Overall impression: Nonischemic cardiomyopathy, probably related to underlying atrial fibrillation and hypertensive heart disease.  No significant change in coronary anatomy, has moderate underlying disease in the LAD but not hemodynamically significant and previously placed stent in the circumflex widely patent (Circumflex stent placed 11/27/2017, 4.5 x 18 mm resolute widely patent, patent 3.5 x 18 mm Xience DES to dominant distal left circumflex, 2.5 x 12 mm Xience to OM1 placed 05/30/2013.)    Recommendation: Would consider atrial fibrillation ablation to evaluate and see whether his LVEF would improve.  Aggressive medical management of chronic systolic and diastolic heart failure, including weight loss and aggressive blood pressure control would also help.  95  mill contrast utilized.  Sleep study X 2 negative   Event Monitor 30 days 07/28/2018: Paroxysmal episodes of atrial fibrillation with controlled ventricular response was noted along with PVCs. Some of this symptoms of chest pain, palpitations and dizziness correlated with atrial fibrillation, others showed normal sinus rhythm. No heart block, no other significant arrhythmias.  Direct current cardioversion ( Ist 05/27/16) 06/11/2016: 150x2, 200 x1 to NSR on Sotalol. A. Fib ablation by Dr. [name] on 11/13/2016  EKG   EKG 03/05/2021: Sinus rhythm with frequent PVCs including trigeminal pattern at a rate of 75 bpm.  Normal axis.  Incomplete right bundle branch block.  No evidence of ischemia or underlying injury pattern.  EKG 12/06/20: Sinus bradycardia at a rate of 55 bpm.  Normal axis.  No evidence of ischemia or injury pattern.   EKG 01/04/2020: Atrial fibrillation with rapid ventricular response at the rate of 111 bpm, normal axis, diffuse ST-T wave abnormality, cannot exclude inferior ischemia, anterolateral ischemia.  Normal QT interval.   No significant change from  EKG 12/20/2019: Atrial fibrillation with RVR at rate of 121 bpm  EKG 03/31/2019: Atrial fibrillation controlled ventricular response at the rate of 70 bpm, normal axis.   Assessment     ICD-10-CM   1. Essential hypertension  B02 Basic metabolic panel    amLODipine (NORVASC) 10 MG tablet  2. Hypercholesteremia  E78.00   3. Chronic combined systolic and diastolic heart failure (HCC)  I50.42  4. Chest tightness  R07.89 EKG 12-Lead  5. Shortness of breath  R06.02 CT ANGIO CHEST PE W OR WO CONTRAST  6. Precordial pain  R07.2 CT ANGIO CHEST PE W OR WO CONTRAST    Meds ordered this encounter  Medications  . amLODipine (NORVASC) 10 MG tablet    Sig: Take 1 tablet (10 mg total) by mouth daily.    Dispense:  90 tablet    Refill:  3  . apixaban (ELIQUIS) 5 MG TABS tablet    Sig: Take 1 tablet (5 mg total) by mouth 2 (two)  times daily.    Dispense:  180 tablet    Refill:  0    Medications Discontinued During This Encounter  Medication Reason  . amLODipine (NORVASC) 10 MG tablet Reorder  . ELIQUIS 5 MG TABS tablet Reorder    This patients CHA2DS2-VASc Score 5 (CHF, HTN, Vasc, TIA) and yearly risk of stroke 7.2%.   Recommendations:   Danny Kelley  is a  55 y.o. Caucasian male patient with history of cardiomyopathy with chronic systolic heart failure, hypertension, hyperlipidemia (statin intolerant), obstructive sleep apnea compliant with CPAP, tobacco use.  Patient has history of TIA in Feb 2018 while on anticoagulants, paroxysmal atrial fibrillation status post ablation 11/13/2016 and 03/01/2020 (Multaq and sotalol ineffective).  History of unstable angina pectoris on 11/26/2017, angioplasty to superdominant left circumflex coronary artery with 4.5 mm resolute DES in the midsegment, patent stent placed in Distal Cx and OM 1 in 2014, 03/22/2019, stenting to high-grade stenosis in the distal dominant Circumflex PDA branch. Cardiac catheterization on 01/24/2020 revealed no change in coronary anatomy, suspect his cardiomyopathy may have been related to underlying A. Fib.   Of note patient has previously been evaluated for syncope, which was felt to be psychogenic in view of his underlying history of seizure disorder. Patient is enrolled in remote patient monitoring for hypertension.  Patient has been unable to tolerate statin therapy in the past due to severe myalgias including atorvastatin, rosuvastatin, and simvastatin.  Patient presents for 3-monthfollow-up of CAD, hypertension, hyperlipidemia, and heart failure.  At last visit started patient on Repatha as well as referred to GI and pulmonary for further evaluation of dyspnea and chest pain as cardiac work-up has been relatively unyielding.  Patient is presently undergoing both GI and pulmonary evaluation to evaluate chest pain and shortness of breath.  Given thorough  cardiovascular work-up has low suspicion symptoms are related to underlying cardiac etiology.  However as patient continues to have complaints of chest discomfort and shortness of breath will obtain CT angiogram to rule pulmonary embolism.  Will not make changes to medications at this time, do not feel further testing is indicated at this time.  We will make further recommendations pending GI and pulmonology evaluation.  If these evaluations are negative, we will then consider repeat cardiac catheterization at that time.  Patient has been started on Repatha for hyperlipidemia, will plan to repeat lipid profile testing at next visit.  Patient is currently enrolled in remote patient monitoring, and blood pressures are well controlled.  Will not make changes to antihypertensive medications at this time.  Notably EKG today did reveal frequent PVCs, will continue to monitor.  Advised patient regarding signs and symptoms that would warrant emergent evaluation, he verbalized understanding agreement.  Follow up in 3 months, sooner if needed, for hypertension, hyperlipidemia, CAD, PAF.    CAlethia Berthold PA-C 03/06/2021, 10:56 AM Office: [phone]

## 2021-03-04 NOTE — Telephone Encounter (Signed)
Have you had any luck connecting with him?

## 2021-03-05 ENCOUNTER — Other Ambulatory Visit: Payer: Self-pay

## 2021-03-05 ENCOUNTER — Ambulatory Visit: Payer: Medicare Other | Admitting: Student

## 2021-03-05 ENCOUNTER — Encounter: Payer: Self-pay | Admitting: Student

## 2021-03-05 VITALS — BP 140/82 | HR 50 | Temp 98.6°F | Ht 67.5 in | Wt 253.0 lb

## 2021-03-05 DIAGNOSIS — R0602 Shortness of breath: Secondary | ICD-10-CM

## 2021-03-05 DIAGNOSIS — I10 Essential (primary) hypertension: Secondary | ICD-10-CM

## 2021-03-05 DIAGNOSIS — E7800 Pure hypercholesterolemia, unspecified: Secondary | ICD-10-CM

## 2021-03-05 DIAGNOSIS — I5042 Chronic combined systolic (congestive) and diastolic (congestive) heart failure: Secondary | ICD-10-CM

## 2021-03-05 DIAGNOSIS — R072 Precordial pain: Secondary | ICD-10-CM

## 2021-03-05 DIAGNOSIS — R0789 Other chest pain: Secondary | ICD-10-CM

## 2021-03-05 MED ORDER — AMLODIPINE BESYLATE 10 MG PO TABS: 10.0000 mg | ORAL_TABLET | Freq: Every day | ORAL | 3 refills | Status: DC

## 2021-03-05 MED ORDER — ELIQUIS 5 MG PO TABS: 1.0000 | ORAL_TABLET | Freq: Two times a day (BID) | ORAL | 0 refills | Status: DC

## 2021-03-07 NOTE — Progress Notes (Signed)
Cardiology Office Note Date:  03/08/2021  Patient ID:  Danny Kelley, Danny Kelley [birth year], MRN [id number] PCP:  Angelina Sheriff, MD  Cardiologist:  Dr. Einar Kelley Electrophysiologist: Dr. Rayann Kelley    Chief Complaint:  Missed his 3 mo follow up   History of Present Illness: Danny Kelley is a 55 y.o. male with history of CAD (stable CAD, patent stents by cath March 2021), HTN, HLD, OSA w/CPAP, TIA (on a/c), AFib, CHF, NICM (suspect 2/2 AFib, and subsequently has normalized), PVCs  He comes in today to be seen for Dr. Rayann Kelley, last seen by him Nov 2021, was concerned about his BP and mentioned reduced exertional capacity, was maintaining SR and planned continued f/u and updated echo with Dr. Einar Kelley, planned for 34movisit to f/u.  More recently he saw C. Cantwell, PA-C for Dr. GEinar Gipon 03/05/21, he reported unchanged c/o CP, palpitations and SOB all unchanged, recently started on an inhaler by his PMD. Mentions w/u by GI and pulmonary were pending to further evaluate his CP, SOB, if unrevealing planned for another cath He was walking regularly on the treadmill. planned for 3 mo visit  TODAY Unfortunately c/w DOE and some heaviness in his chest, largely unchanged now for quite a long time despite improvement in his EF The albuterol his PMD gave him did seem to help but made him feel agitated and stopped. He sees Dr. OGala Murdochfor a pulmonary evaluation next week. And as above if that is not revealing planned for perhaps a new cath with Dr. GAmanda Cockayne  He does not think he has had Afib  No bleeding or signs of bleeding Meds are covered well and reports compliance  He is following closely with Dr. GDarrick Pennayeam Doing daily BP checks and check ins with that system    AFib Hx Diagnosed 2017, reported lifelong hx of palpitations PVI ablation Jan 2018 and April 2021  AAD Hx Sotalol stopped 2/2 syncope and prolonged QT Tikosyn failed as well with prolonged QT Multaq failed   Past Medical History:   Diagnosis Date  . Arthritis    "a little in my right knee" (07/14/2016)  . CAD (coronary artery disease)   . Chronic congestive heart failure with [place] ventricular diastolic dysfunction ([place]   . Complication of anesthesia    Pt reports "they have a hard time waking me up"  . GERD (gastroesophageal reflux disease)    hx  . Headache    "with every heart issue that I have" (07/14/2016)  . High cholesterol   . History of hiatal hernia 1990s   "fixed it w/scope down my throat"  . Hypertension   . Myocardial infarction ([facility] 05/2013   st[facility] Obesity   . Persistent atrial fibrillation ([place]   . Pneumonia ~ 1992  . QT prolongation 07/15/2016  . Seasonal allergies    "I take Allegra prn" (07/14/2016)  . Tobacco abuse 12/16/2016    Past Surgical History:  Procedure Laterality Date  . APPENDECTOMY  1984  . ATRIAL FIBRILLATION ABLATION N/A 03/01/2020   Procedure: ATRIAL FIBRILLATION ABLATION;  Surgeon: AThompson Grayer MD;  [place];  Service: Cardiovascular;  Laterality: N/A;  . CARDIOVERSION N/A 05/27/2016   Procedure: CARDIOVERSION;  Surgeon: JAdrian Prows MD;  [facility];  Service: Cardiovascular;  Laterality: N/A;  . CARDIOVERSION N/A 06/11/2016   Procedure: CARDIOVERSION;  Surgeon: JAdrian Prows MD;  [place]  Service: Cardiovascular;  Laterality: N/A;  . CARDIOVERSION N/A 03/21/2020   Procedure: CARDIOVERSION;  Surgeon: Geralynn Rile, MD;  Location: [place];  Service: Cardiovascular;  Laterality: N/A;  . CORONARY ANGIOPLASTY WITH STENT PLACEMENT  05/30/2013   "2 stents put in at [facility]" & /stent card  . CORONARY STENT INTERVENTION N/A 11/27/2017   Procedure: CORONARY STENT INTERVENTION;  Surgeon: [name], MD;  Location: [place] CV LAB;  Service: Cardiovascular;  Laterality: N/A;  . CORONARY STENT INTERVENTION N/A 03/21/2019   Procedure: CORONARY STENT INTERVENTION;  Surgeon: [name], MD;  Location: [place] CV LAB;  Service: Cardiovascular;  Laterality: N/A;  . ELECTROPHYSIOLOGIC STUDY N/A 11/13/2016   Procedure: Atrial Fibrillation Ablation;  Surgeon: [name], MD;  Location: [place] CV LAB;  Service: Cardiovascular;  Laterality: N/A;  . ESOPHAGOGASTRODUODENOSCOPY (EGD) WITH ESOPHAGEAL DILATION  1990s X 2  . INTRAVASCULAR PRESSURE WIRE/FFR STUDY N/A 09/21/2018   Procedure: INTRAVASCULAR PRESSURE WIRE/FFR STUDY;  Surgeon: [name], MD;  Location: [place] CV LAB;  Service: Cardiovascular;  Laterality: N/A;  . INTRAVASCULAR PRESSURE WIRE/FFR STUDY N/A 03/21/2019   Procedure: INTRAVASCULAR PRESSURE WIRE/FFR STUDY;  Surgeon: [name], MD;  Location: [place] CV LAB;  Service: Cardiovascular;  Laterality: N/A;  . INTRAVASCULAR PRESSURE WIRE/FFR STUDY N/A 01/24/2020   Procedure: INTRAVASCULAR PRESSURE WIRE/FFR STUDY;  Surgeon: [name], MD;  Location: [place] CV LAB;  Service: Cardiovascular;  Laterality: N/A;  . KNEE ARTHROSCOPY Right 1986; ~ 1995 X 2  . LAPAROSCOPIC CHOLECYSTECTOMY  2015  . LEFT HEART CATH AND CORONARY ANGIOGRAPHY N/A 11/27/2017   Procedure: LEFT HEART CATH AND CORONARY ANGIOGRAPHY;  Surgeon: [name], MD;  Location: [place] CV LAB;  Service: Cardiovascular;  Laterality: N/A;  . LEFT HEART CATH AND CORONARY ANGIOGRAPHY N/A 09/21/2018   Procedure: LEFT HEART CATH AND CORONARY ANGIOGRAPHY;  Surgeon: [name], MD;  Location: [place] CV LAB;  Service: Cardiovascular;  Laterality: N/A;  . LEFT HEART CATH AND CORONARY ANGIOGRAPHY N/A 03/21/2019   Procedure: LEFT HEART CATH AND CORONARY ANGIOGRAPHY;  Surgeon: [name], MD;  Location: [place] CV LAB;  Service: Cardiovascular;  Laterality: N/A;  . REPAIR OF ESOPHAGUS  1998   perforation of the distal esophagus from bad heartburn  . RIGHT/LEFT HEART CATH AND CORONARY ANGIOGRAPHY N/A 01/24/2020   Procedure: RIGHT/LEFT HEART CATH AND CORONARY ANGIOGRAPHY;  Surgeon: [name],  MD;  Location: [place] CV LAB;  Service: Cardiovascular;  Laterality: N/A;  . TEE WITHOUT CARDIOVERSION N/A 11/13/2016   Procedure: TRANSESOPHAGEAL ECHOCARDIOGRAM (TEE);  Surgeon: Thayer Headings, MD;  Location: [facility] ENDOSCOPY;  Service: Cardiovascular;  Laterality: N/A;    Current Outpatient Medications  Medication Sig Dispense Refill  . acetaminophen (TYLENOL) 500 MG tablet Take 500 mg by mouth 2 (two) times daily as needed for moderate pain.    Marland Kitchen amLODipine (NORVASC) 10 MG tablet Take 1 tablet (10 mg total) by mouth daily. 90 tablet 3  . apixaban (ELIQUIS) 5 MG TABS tablet Take 1 tablet (5 mg total) by mouth 2 (two) times daily. 180 tablet 0  . bisacodyl (DULCOLAX) 5 MG EC tablet Take 5 mg by mouth daily as needed.     . diltiazem (CARDIZEM) 30 MG tablet Take 1 tablet every 4 hours AS NEEDED for heart rate >110 45 tablet 1  . docusate sodium (COLACE) 100 MG capsule Take 100 mg by mouth 2 (two) times daily as needed.     . Evolocumab (REPATHA) 140 MG/ML SOSY Inject 1  mL into the skin every 14 (fourteen) days. 2 mL 3  . ezetimibe (ZETIA) 10 MG tablet Take 1 tablet (10 mg total) by mouth daily. 30 tablet 12  . fexofenadine (ALLEGRA) 180 MG tablet Take 180 mg by mouth daily as needed (seasonal allergies).     . hydrALAZINE (APRESOLINE) 50 MG tablet Take 1 tablet (50 mg total) by mouth 3 (three) times daily. 90 tablet 2  . metoprolol tartrate (LOPRESSOR) 100 MG tablet Take 1 tablet by mouth twice daily 180 tablet 0  . nitroGLYCERIN (NITROSTAT) 0.4 MG SL tablet Place 1 tablet (0.4 mg total) under the tongue every 5 (five) minutes as needed for chest pain. 25 tablet 2  . pantoprazole (PROTONIX) 20 MG tablet Take 1 tablet (20 mg total) by mouth 2 (two) times daily. 60 tablet 0  . sacubitril-valsartan (ENTRESTO) 97-103 MG Take 1 tablet by mouth 2 (two) times daily. 60 tablet 3  . shark liver oil-cocoa butter (PREPARATION H) 0.25-3-85.5 % suppository Place 1 suppository rectally 2 (two) times daily as  needed for hemorrhoids.    . torsemide (DEMADEX) 20 MG tablet Take 20 mg by mouth daily as needed (swelling).    . Na Sulfate-K Sulfate-Mg Sulf (SUPREP BOWEL PREP KIT) 17.5-3.13-1.6 GM/177ML SOLN Take 1 kit by mouth as directed. For colonoscopy prep 354 mL 0   No current facility-administered medications for this visit.    Allergies:   Codeine, Crestor [rosuvastatin], Dilaudid [hydromorphone], Isosorbide, Lexapro [escitalopram], Lipitor [atorvastatin calcium], Erythromycin, and Oxycodone   Social History:  The patient  reports that he quit smoking about 13 months ago. His smoking use included cigarettes and e-cigarettes. He quit after 25.00 years of use. He has never used smokeless tobacco. He reports that he does not drink alcohol and does not use drugs.   Family History:  The patient's family history includes Breast cancer in his mother; Congestive Heart Failure in his father; Diabetes in his father and mother; Heart attack (age of onset: 39) in his father; Heart disease (age of onset: 36) in his mother; Lung cancer in his father and paternal uncle; Tongue cancer in his mother.  ROS:  Please see the history of present illness.    All other systems are reviewed and otherwise negative.   PHYSICAL EXAM:  VS:  BP 106/80   Pulse (!) 50   Ht 5' 7.5" (1.715 m)   Wt 252 lb (114.3 kg)   BMI 38.89 kg/m  BMI: Body mass index is 38.89 kg/m. Well nourished, well developed, in no acute distress HEENT: normocephalic, atraumatic Neck: no JVD, carotid bruits or masses Cardiac:  RRR; intermittent extrasytoles, no significant murmurs, no rubs, or gallops AUSCULTATED HR IS 72bpm Lungs:  CTA b/l, no wheezing, rhonchi or rales Abd: soft, nontender MS: no deformity or atrophy Ext:  no edema Skin: warm and dry, no rash Neuro:  No gross deficits appreciated Psych: euthymic mood, full affect   EKG: not done today Reviewed EKG from 03/05/21 is SR, PVCs, 75bpm  11/15/2020: TTE LVEF 50-55% No  significant VHD  Nov 2021: Monitor Sinus rhythm Premature atrial contractions and premature ventricular contractions are noted Rare nonsustained ventricular tachycardia No sustained arrhythmias    03/01/2020: EPS/ABlation CONCLUSIONS: 1. Atrial fibrillation upon presentation.   2. Intracardiac echo reveals a moderate sized left atrium.  There was a common ostium to the left pulmonary veins.  There was no evidence of pulmonary vein stenosis. 3. Return of prodigious electrical activity within the left superior and inferior pulmonary  veins from a prior ablation.  There was minor conduction within the right  Inferior pulmonary vein.  The right superior pulmonary vein was quiescent from a prior ablation procedure. 4. Successful electrical reisolation and anatomical encircling of the left pulmonary veins.   Additional ablation was also performed along the right inferior pulmonary vein in order to re-isolate this vein.  5. Additional left atrial ablation was performed with a standard box lesion created along the posterior wall of the left atrium 6. Atrial fibrillation successfully cardioverted to sinus rhythm. 7. No early apparent complications.   Left and right heart catheterization 01/24/2020: Right heart: RA 18/18, mean 17; RV 44/10, EDP 15; PA 48/19, mean 33.  PA saturation 72%.  PW 25/26, mean 23 mmHg.  Aortic saturation 98%. Cardiac output 4.73, cardiac index 2.09, reduced.  QP/QS 1.00. Impression: Mild decrease in cardiac output and cardiac index.  Mild to moderate pulmonary hypertension related to elevated LVEDP.  Left heart catheterization: LV pressure 139/17, EDP 31 mmHg.  Aortic pressure 144/97, mean 122 mmHg.  No pressure gradient. Right coronary artery is nondominant but a large vessel with a proximal 70% stenosis unchanged from cardiac catheterization previously. Left main is large.  It trifurcates. Circumflex: Dominant vessel.  Previously placed stent in the proximal to mid  circumflex, OM1 and also PDA widely patent. Ramus intermediate: Large vessel, mild disease. LAD: Large vessel, diffuse 60 to 70% in some views appears to be almost 80% proximal long segment stenosis.  DFR 0.91, FFR 0.89,  not hemodynamically significant.  Lesion slightly progressed compared to prior angiography in 2020.  Overall impression: Nonischemic cardiomyopathy, probably related to underlying atrial fibrillation and hypertensive heart disease.  No significant change in coronary anatomy, has moderate underlying disease in the LAD but not hemodynamically significant and previously placed stent in the circumflex widely patent.  Would consider atrial fibrillation ablation to evaluate and see whether his LVEF would improve.  Aggressive medical management of chronic systolic and diastolic heart failure, including weight loss and aggressive blood pressure control would also help.  95 mill contrast utilized.   12/28/2019: TTE LVEF 25-30% DD II No significant VHD  11/13/2016: EPS/ABlation CONCLUSIONS: 1. Sinus rhythm upon presentation.   2. Intracardiac echo reveals a moderate sized left atrium with four separate pulmonary veins without evidence of pulmonary vein stenosis. 3. Successful electrical isolation and anatomical encircling of all four pulmonary veins with radiofrequency current. 4. No inducible arrhythmias following ablation both on and off of Isuprel 5. No early apparent complications.    Recent Labs: 12/10/2020: NT-Pro BNP 74 01/09/2021: ALT 21; BUN 13; Creatinine, Ser 1.07; Hemoglobin 16.9; Platelets 279.0; Potassium 3.6; Sodium 137; TSH 2.22  No results found for requested labs within last 8760 hours.   CrCl cannot be calculated (Patient's most recent lab result is older than the maximum 21 days allowed.).   Wt Readings from Last 3 Encounters:  03/08/21 252 lb (114.3 kg)  03/05/21 253 lb (114.8 kg)  02/20/21 249 lb 6 oz (113.1 kg)     Other studies reviewed: Additional  studies/records reviewed today include: summarized above  ASSESSMENT AND PLAN:  1. Persistent AFib     CHA2DS2Vasc is 5, on Eliquis, appropriately dosed     low burden by symptoms  2. NICM 3. Chronic CHF 4. CAD     His LVEF has normalized by echo done jan this year     He does not appear volume OL today     C/w Dr. Einar Kelley  Disposition: F/u with EP in 53mo sooner if needed  Current medicines are reviewed at length with the patient today.  The patient did not have any concerns regarding medicines.  SVenetia Night PA-C 03/08/2021 1:27 PM     [place] [phone])  [phone])

## 2021-03-08 ENCOUNTER — Ambulatory Visit (INDEPENDENT_AMBULATORY_CARE_PROVIDER_SITE_OTHER): Payer: Medicare Other | Admitting: Physician Assistant

## 2021-03-08 ENCOUNTER — Encounter: Payer: Self-pay | Admitting: Physician Assistant

## 2021-03-08 ENCOUNTER — Other Ambulatory Visit: Payer: Self-pay

## 2021-03-08 VITALS — BP 106/80 | HR 50 | Ht 67.5 in | Wt 252.0 lb

## 2021-03-08 DIAGNOSIS — I480 Paroxysmal atrial fibrillation: Secondary | ICD-10-CM | POA: Diagnosis not present

## 2021-03-08 DIAGNOSIS — I428 Other cardiomyopathies: Secondary | ICD-10-CM

## 2021-03-08 DIAGNOSIS — I5022 Chronic systolic (congestive) heart failure: Secondary | ICD-10-CM

## 2021-03-08 DIAGNOSIS — I2510 Atherosclerotic heart disease of native coronary artery without angina pectoris: Secondary | ICD-10-CM | POA: Diagnosis not present

## 2021-03-08 NOTE — Patient Instructions (Signed)
   Medication Instructions:   Your physician recommends that you continue on your current medications as directed. Please refer to the Current Medication list given to you today.   *If you need a refill on your cardiac medications before your next appointment, please call your pharmacy*   Lab Work:  [place]   If you have labs (blood work) drawn today and your tests are completely normal, you will receive your results only by: Marland Kitchen MyChart Message (if you have MyChart) OR . A paper copy in the mail If you have any lab test that is abnormal or we need to change your treatment, we will call you to review the results.   Testing/Procedures: NONE ORDERED  TODAY     Follow-Up: At [facility], you and your health needs are our priority.  As part of our continuing mission to provide you with exceptional heart care, we have created designated Provider Care Teams.  These Care Teams include your primary Cardiologist (physician) and Advanced Practice Providers (APPs -  Physician Assistants and Nurse Practitioners) who all work together to provide you with the care you need, when you need it.  We recommend signing up for the patient portal called "MyChart".  Sign up information is provided on this After Visit Summary.  MyChart is used to connect with patients for Virtual Visits (Telemedicine).  Patients are able to view lab/test results, encounter notes, upcoming appointments, etc.  Non-urgent messages can be sent to your provider as well.   To learn more about what you can do with MyChart, go to [email or web address].    Your next appointment:   6 month(s)  The format for your next appointment:   In Person  Provider:   Tommye Standard, PA-C

## 2021-03-14 ENCOUNTER — Encounter: Payer: Self-pay | Admitting: Pulmonary Disease

## 2021-03-14 ENCOUNTER — Other Ambulatory Visit: Payer: Self-pay

## 2021-03-14 ENCOUNTER — Ambulatory Visit (INDEPENDENT_AMBULATORY_CARE_PROVIDER_SITE_OTHER): Payer: Medicare Other | Admitting: Pulmonary Disease

## 2021-03-14 VITALS — BP 122/84 | HR 66 | Temp 98.1°F | Ht 69.0 in | Wt 250.0 lb

## 2021-03-14 DIAGNOSIS — R0602 Shortness of breath: Secondary | ICD-10-CM | POA: Diagnosis not present

## 2021-03-14 NOTE — Progress Notes (Signed)
Danny Kelley    [id number]    [birth year]  Primary Care Physician:Redding, Angelique Blonder, MD  Referring Physician: Alethia Berthold, PA-C [place],  [place] [address]  Chief complaint:   Patient with shortness of breath  HPI:  Is on multiple evaluation recently History of coronary artery disease for which he is at stenting in the past  Reformed smoker quit about a year ago  History of obstructive sleep apnea He stated his last 2 studies were negative for significant sleep disordered breathing  He is no longer significantly snoring or holding his breath according to his spouse  Shortness of breath is usually at rest Feels his breathing is just not right  Since he has been trying to lose weight, he walks regularly, rides a bike regularly Able to tolerate a mile or more of walking  Admits to having some history of anxiety  No chest pains or chest discomfort No cough No symptoms suggesting an infection  Used to receive allergy shots as a teenager but not really having any significant symptoms suggesting allergies recently  He currently weighs about 250, down from about 300 at his heaviest weight  Outpatient Encounter Medications as of 03/14/2021  Medication Sig  . acetaminophen (TYLENOL) 500 MG tablet Take 500 mg by mouth 2 (two) times daily as needed for moderate pain.  Marland Kitchen amLODipine (NORVASC) 10 MG tablet Take 1 tablet (10 mg total) by mouth daily.  Marland Kitchen apixaban (ELIQUIS) 5 MG TABS tablet Take 1 tablet (5 mg total) by mouth 2 (two) times daily.  . bisacodyl (DULCOLAX) 5 MG EC tablet Take 5 mg by mouth daily as needed.   . diltiazem (CARDIZEM) 30 MG tablet Take 1 tablet every 4 hours AS NEEDED for heart rate >110  . docusate sodium (COLACE) 100 MG capsule Take 100 mg by mouth 2 (two) times daily as needed.   . Evolocumab (REPATHA) 140 MG/ML SOSY Inject 1 mL into the skin every 14 (fourteen) days.  Marland Kitchen ezetimibe (ZETIA) 10 MG tablet Take 1  tablet (10 mg total) by mouth daily.  . fexofenadine (ALLEGRA) 180 MG tablet Take 180 mg by mouth daily as needed (seasonal allergies).   . hydrALAZINE (APRESOLINE) 50 MG tablet Take 1 tablet (50 mg total) by mouth 3 (three) times daily.  . metoprolol tartrate (LOPRESSOR) 100 MG tablet Take 1 tablet by mouth twice daily  . nitroGLYCERIN (NITROSTAT) 0.4 MG SL tablet Place 1 tablet (0.4 mg total) under the tongue every 5 (five) minutes as needed for chest pain.  . pantoprazole (PROTONIX) 20 MG tablet Take 1 tablet (20 mg total) by mouth 2 (two) times daily.  . sacubitril-valsartan (ENTRESTO) 97-103 MG Take 1 tablet by mouth 2 (two) times daily.  . shark liver oil-cocoa butter (PREPARATION H) 0.25-3-85.5 % suppository Place 1 suppository rectally 2 (two) times daily as needed for hemorrhoids.  . torsemide (DEMADEX) 20 MG tablet Take 20 mg by mouth daily as needed (swelling).  . Na Sulfate-K Sulfate-Mg Sulf (SUPREP BOWEL PREP KIT) 17.5-3.13-1.6 GM/177ML SOLN Take 1 kit by mouth as directed. For colonoscopy prep (Patient not taking: Reported on 03/14/2021)   No facility-administered encounter medications on file as of 03/14/2021.    Allergies as of 03/14/2021 - Review Complete 03/14/2021  Allergen Reaction Noted  . Codeine Anaphylaxis 03/12/2020  . Crestor [rosuvastatin] Other (See Comments) 09/29/2016  . Albuterol Swelling 03/14/2021  . Dilaudid [hydromorphone] Itching and Swelling  09/16/2018  . Isosorbide  09/16/2018  . Lexapro [escitalopram]  05/03/2019  . Lipitor [atorvastatin calcium] Other (See Comments) 11/27/2017  . Erythromycin Other (See Comments) 05/27/2016  . Oxycodone Itching and Rash 11/27/2017    Past Medical History:  Diagnosis Date  . Arthritis    "a little in my right knee" (07/14/2016)  . CAD (coronary artery disease)   . Chronic congestive heart failure with left ventricular diastolic dysfunction ([place])   . Complication of anesthesia    Pt reports "they have a hard time  waking me up"  . GERD (gastroesophageal reflux disease)    hx  . Headache    "with every heart issue that I have" (07/14/2016)  . High cholesterol   . History of hiatal hernia 1990s   "fixed it w/scope down my throat"  . Hypertension   . Myocardial infarction [facility]) 05/2013   stents: left PDA, 1st OM at [facility]  . Obesity   . Persistent atrial fibrillation ([place])   . Pneumonia ~ 1992  . QT prolongation 07/15/2016  . Seasonal allergies    "I take Allegra prn" (07/14/2016)  . Tobacco abuse 12/16/2016    Past Surgical History:  Procedure Laterality Date  . APPENDECTOMY  1984  . ATRIAL FIBRILLATION ABLATION N/A 03/01/2020   Procedure: ATRIAL FIBRILLATION ABLATION;  Surgeon: [name], MD;  Location: [place] [facility];  Service: Cardiovascular;  Laterality: N/A;  . CARDIOVERSION N/A 05/27/2016   Procedure: CARDIOVERSION;  Surgeon: [name], MD;  Location: [facility] ENDOSCOPY;  Service: Cardiovascular;  Laterality: N/A;  . CARDIOVERSION N/A 06/11/2016   Procedure: CARDIOVERSION;  Surgeon: [name], MD;  Location: [facility] ENDOSCOPY;  Service: Cardiovascular;  Laterality: N/A;  . CARDIOVERSION N/A 03/21/2020   Procedure: CARDIOVERSION;  Surgeon: Geralynn Rile, MD;  Location: [place];  Service: Cardiovascular;  Laterality: N/A;  . CORONARY ANGIOPLASTY WITH STENT PLACEMENT  05/30/2013   "2 stents put in at [facility]" & /stent card  . CORONARY STENT INTERVENTION N/A 11/27/2017   Procedure: CORONARY STENT INTERVENTION;  Surgeon: [name], MD;  Location: [place] [facility];  Service: Cardiovascular;  Laterality: N/A;  . CORONARY STENT INTERVENTION N/A 03/21/2019   Procedure: CORONARY STENT INTERVENTION;  Surgeon: [name], MD;  Location: [place] [facility];  Service: Cardiovascular;  Laterality: N/A;  . ELECTROPHYSIOLOGIC STUDY N/A 11/13/2016   Procedure: Atrial Fibrillation Ablation;  Surgeon: [name], MD;  Location: [place] [facility];  Service: Cardiovascular;  Laterality:  N/A;  . ESOPHAGOGASTRODUODENOSCOPY (EGD) WITH ESOPHAGEAL DILATION  1990s X 2  . INTRAVASCULAR PRESSURE WIRE/FFR STUDY N/A 09/21/2018   Procedure: INTRAVASCULAR PRESSURE WIRE/FFR STUDY;  Surgeon: [name], MD;  Location: [place] [facility];  Service: Cardiovascular;  Laterality: N/A;  . INTRAVASCULAR PRESSURE WIRE/FFR STUDY N/A 03/21/2019   Procedure: INTRAVASCULAR PRESSURE WIRE/FFR STUDY;  Surgeon: [name], MD;  Location: [place] [facility];  Service: Cardiovascular;  Laterality: N/A;  . INTRAVASCULAR PRESSURE WIRE/FFR STUDY N/A 01/24/2020   Procedure: INTRAVASCULAR PRESSURE WIRE/FFR STUDY;  Surgeon: [name], MD;  Location: [place] [facility];  Service: Cardiovascular;  Laterality: N/A;  . KNEE ARTHROSCOPY Right 1986; ~ 1995 X 2  . LAPAROSCOPIC CHOLECYSTECTOMY  2015  . LEFT HEART CATH AND CORONARY ANGIOGRAPHY N/A 11/27/2017   Procedure: LEFT HEART CATH AND CORONARY ANGIOGRAPHY;  Surgeon: [name], MD;  Location: [place] [facility];  Service: Cardiovascular;  Laterality: N/A;  . LEFT HEART CATH AND CORONARY ANGIOGRAPHY N/A 09/21/2018   Procedure:  LEFT HEART CATH AND CORONARY ANGIOGRAPHY;  Surgeon: [name], MD;  Location: [place] [facility];  Service: Cardiovascular;  Laterality: N/A;  . LEFT HEART CATH AND CORONARY ANGIOGRAPHY N/A 03/21/2019   Procedure: LEFT HEART CATH AND CORONARY ANGIOGRAPHY;  Surgeon: [name], MD;  Location: [place] [facility];  Service: Cardiovascular;  Laterality: N/A;  . REPAIR OF ESOPHAGUS  1998   perforation of the distal esophagus from bad heartburn  . RIGHT/LEFT HEART CATH AND CORONARY ANGIOGRAPHY N/A 01/24/2020   Procedure: RIGHT/LEFT HEART CATH AND CORONARY ANGIOGRAPHY;  Surgeon: [name], MD;  Location: [place] [facility];  Service: Cardiovascular;  Laterality: N/A;  . TEE WITHOUT CARDIOVERSION N/A 11/13/2016   Procedure: TRANSESOPHAGEAL ECHOCARDIOGRAM (TEE);  Surgeon: Thayer Headings, MD;  Location[facility] ENDOSCOPY;  Service: Cardiovascular;  Laterality:  N/A;    Family History  Problem Relation Age of Onset  . Heart disease Mother 58       CABG age 73  . Breast cancer Mother   . Tongue cancer Mother   . Diabetes Mother   . Heart attack Father 62       Father had around 7 heart attacks per pt  . Diabetes Father   . Lung cancer Father   . Congestive Heart Failure Father   . Lung cancer Paternal Uncle   . Colon cancer Neg Hx   . Esophageal cancer Neg Hx   . Inflammatory bowel disease Neg Hx   . Liver disease Neg Hx   . Pancreatic cancer Neg Hx   . Rectal cancer Neg Hx   . Stomach cancer Neg Hx     Social History   Socioeconomic History  . Marital status: Married    Spouse name: Not on file  . Number of children: 2  . Years of education: Not on file  . Highest education level: Not on file  Occupational History  . Not on file  Tobacco Use  . Smoking status: Former Smoker    Years: 25.00    Types: Cigarettes, E-cigarettes    Quit date: 07/06/2020    Years since quitting: 0.6  . Smokeless tobacco: Never Used  . Tobacco comment: Stopped smoking Sept 3, 2021  Vaping Use  . Vaping Use: Never used  Substance and Sexual Activity  . Alcohol use: No  . Drug use: No  . Sexual activity: Yes  Other Topics Concern  . Not on file  Social History Narrative   Pt lives in [place] with spouse and 37 year old son.   disabled   Social Determinants of Radio broadcast assistant Strain: Not on file  Food Insecurity: Not on file  Transportation Needs: Not on file  Physical Activity: Not on file  Stress: Not on file  Social Connections: Not on file  Intimate Partner Violence: Not on file    Review of Systems  Constitutional: Negative for fatigue.  Respiratory: Positive for shortness of breath.   Psychiatric/Behavioral: Negative for sleep disturbance.    Vitals:   03/14/21 1525  BP: 122/84  Pulse: 66  Temp: 98.1 F (36.7 C)  SpO2: 98%     Physical Exam Constitutional:      Appearance: He is obese.  HENT:      Head: Normocephalic.     Mouth/Throat:     Mouth: Mucous membranes are moist.  Eyes:     Pupils: Pupils are equal, round, and reactive to light.  Cardiovascular:     Rate and Rhythm: Normal  rate and regular rhythm.     Pulses: Normal pulses.     Heart sounds: Normal heart sounds. No murmur heard. No friction rub.  Pulmonary:     Effort: Pulmonary effort is normal. No respiratory distress.     Breath sounds: Normal breath sounds. No stridor. No wheezing or rhonchi.  Musculoskeletal:     Cervical back: No rigidity or tenderness.  Neurological:     Mental Status: He is alert.  Psychiatric:        Mood and Affect: Mood normal.    Data Reviewed: Chest x-ray within the last couple of weeks-according to patient within normal limits--film or results results not available to me at present  Assessment:  Shortness of breath  Reformed smoker  No underlying lung disease known to patient  History of hypertension, diastolic heart failure, coronary artery disease, paroxysmal atrial fibrillation  Past history of obstructive sleep apnea -States recent sleep study x2 negative  Plan/Recommendations: Try and obtain a copy of his most recent sleep study  Continue graded exercise as tolerated  Schedule patient for pulmonary function test  I will see him back in a month   Sherrilyn Rist MD [facility] 03/14/2021, 3:36 PM  CC: Alethia Berthold, PA*

## 2021-03-14 NOTE — Patient Instructions (Signed)
Obtain pulmonary function test by next visit  Continue with graded exercises as tolerated  Call with significant concerns  We will try and get a copy of your most recent sleep study  I will see you in about 4 to 6 weeks

## 2021-03-15 ENCOUNTER — Encounter (INDEPENDENT_AMBULATORY_CARE_PROVIDER_SITE_OTHER): Payer: Self-pay

## 2021-03-15 ENCOUNTER — Other Ambulatory Visit: Payer: Self-pay

## 2021-03-15 MED ORDER — PANTOPRAZOLE SODIUM 20 MG PO TBEC: 20.0000 mg | DELAYED_RELEASE_TABLET | Freq: Two times a day (BID) | ORAL | 2 refills | Status: DC

## 2021-03-18 ENCOUNTER — Other Ambulatory Visit: Payer: Self-pay

## 2021-03-18 DIAGNOSIS — I10 Essential (primary) hypertension: Secondary | ICD-10-CM

## 2021-03-18 DIAGNOSIS — E7800 Pure hypercholesterolemia, unspecified: Secondary | ICD-10-CM

## 2021-03-18 DIAGNOSIS — I25118 Atherosclerotic heart disease of native coronary artery with other forms of angina pectoris: Secondary | ICD-10-CM

## 2021-03-18 DIAGNOSIS — I5042 Chronic combined systolic (congestive) and diastolic (congestive) heart failure: Secondary | ICD-10-CM

## 2021-03-18 MED ORDER — NITROGLYCERIN 0.4 MG SL SUBL: 0.4000 mg | SUBLINGUAL_TABLET | SUBLINGUAL | 2 refills | Status: DC | PRN

## 2021-03-18 MED ORDER — HYDRALAZINE HCL 50 MG PO TABS: 50.0000 mg | ORAL_TABLET | Freq: Three times a day (TID) | ORAL | 2 refills | Status: DC

## 2021-03-18 MED ORDER — PANTOPRAZOLE SODIUM 20 MG PO TBEC: 20.0000 mg | DELAYED_RELEASE_TABLET | Freq: Two times a day (BID) | ORAL | 2 refills | Status: DC

## 2021-03-18 MED ORDER — EZETIMIBE 10 MG PO TABS: 10.0000 mg | ORAL_TABLET | Freq: Every day | ORAL | 2 refills | Status: DC

## 2021-03-18 MED ORDER — ELIQUIS 5 MG PO TABS: 1.0000 | ORAL_TABLET | Freq: Two times a day (BID) | ORAL | 1 refills | Status: DC

## 2021-03-19 ENCOUNTER — Other Ambulatory Visit: Payer: Self-pay | Admitting: Cardiology

## 2021-03-19 ENCOUNTER — Telehealth: Payer: Self-pay | Admitting: Pulmonary Disease

## 2021-03-19 DIAGNOSIS — I4821 Permanent atrial fibrillation: Secondary | ICD-10-CM

## 2021-03-19 DIAGNOSIS — I5041 Acute combined systolic (congestive) and diastolic (congestive) heart failure: Secondary | ICD-10-CM

## 2021-03-19 NOTE — Telephone Encounter (Signed)
Reviewed copy of sleep study from 12/31/2016 performed at [facility] moderate sleep apnea and severe number of events in rem sleep  AHI of 21.1/h, REM AHI 49.1/h

## 2021-03-22 ENCOUNTER — Other Ambulatory Visit: Payer: Self-pay

## 2021-03-22 MED ORDER — ENTRESTO 97-103 MG PO TABS: 1.0000 | ORAL_TABLET | Freq: Two times a day (BID) | ORAL | 3 refills | Status: DC

## 2021-04-03 ENCOUNTER — Ambulatory Visit (INDEPENDENT_AMBULATORY_CARE_PROVIDER_SITE_OTHER): Payer: Medicare Other | Admitting: Gastroenterology

## 2021-04-03 ENCOUNTER — Encounter: Payer: Self-pay | Admitting: Gastroenterology

## 2021-04-03 VITALS — BP 124/88 | HR 68 | Ht 67.5 in | Wt 249.0 lb

## 2021-04-03 DIAGNOSIS — Z1211 Encounter for screening for malignant neoplasm of colon: Secondary | ICD-10-CM

## 2021-04-03 DIAGNOSIS — K449 Diaphragmatic hernia without obstruction or gangrene: Secondary | ICD-10-CM | POA: Diagnosis not present

## 2021-04-03 DIAGNOSIS — R0789 Other chest pain: Secondary | ICD-10-CM

## 2021-04-03 DIAGNOSIS — K219 Gastro-esophageal reflux disease without esophagitis: Secondary | ICD-10-CM

## 2021-04-03 NOTE — Progress Notes (Signed)
[place] VISIT   Primary Care Provider Angelina Sheriff, MD [place] [address] [phone]  Patient Profile: Danny Kelley is a 55 y.o. male with a pmh significant for CAD (status post PCI), atrial fibrillation (status post ablation on Eliquis), hypertension, hyperlipidemia, CHF, obesity, allergies, tobacco use, hiatal hernia, GERD.  The patient presents to the [facility] for an evaluation and management of problem(s) noted below:  Problem List 1. Gastroesophageal reflux disease, unspecified whether esophagitis present   2. Colon cancer screening   3. Hiatal hernia   4. Atypical chest pain     History of Present Illness Please see initial consult note by NP [facility] and my prior progress note for full details of HPI.  Interval History Today, the patient returns for a scheduled follow-up.  He continues to feel well on his twice daily PPI.  He feels that his dysphagia symptoms have improved somewhat.  He feels ready to move forward with his endoscopic evaluation as he is scheduled from above and below.  He is willing to have a dilation if it is felt necessary.  No other new symptoms at this time.  We have already obtained approval for anticoagulation hold from his cardiology service.  GI Review of Systems Positive as above Negative for odynophagia, nausea, vomiting, pain, alteration bowel habits, melena, hematochezia  Review of Systems General: Denies fevers/chills/weight loss unintentionally Cardiovascular: Denies current chest pain/palpitations  Pulmonary: Denies shortness of breath/nocturnal cough Gastroenterological: See HPI Genitourinary: Denies darkened urine Hematological: Positive for history of easy bruising/bleeding due to anticoagulation Dermatological: Denies jaundice Psychological: Mood is stable   Medications Current Outpatient Medications  Medication Sig Dispense Refill  .  acetaminophen (TYLENOL) 500 MG tablet Take 500 mg by mouth 2 (two) times daily as needed for moderate pain.    Marland Kitchen amLODipine (NORVASC) 10 MG tablet Take 1 tablet (10 mg total) by mouth daily. 90 tablet 3  . apixaban (ELIQUIS) 5 MG TABS tablet Take 1 tablet (5 mg total) by mouth 2 (two) times daily. 180 tablet 1  . bisacodyl (DULCOLAX) 5 MG EC tablet Take 5 mg by mouth daily as needed.     . diltiazem (CARDIZEM) 30 MG tablet Take 1 tablet every 4 hours AS NEEDED for heart rate >110 45 tablet 1  . docusate sodium (COLACE) 100 MG capsule Take 100 mg by mouth 2 (two) times daily as needed.     . Evolocumab (REPATHA) 140 MG/ML SOSY Inject 1 mL into the skin every 14 (fourteen) days. 2 mL 3  . ezetimibe (ZETIA) 10 MG tablet Take 1 tablet (10 mg total) by mouth daily. 90 tablet 2  . fexofenadine (ALLEGRA) 180 MG tablet Take 180 mg by mouth daily as needed (seasonal allergies).     . hydrALAZINE (APRESOLINE) 50 MG tablet Take 1 tablet (50 mg total) by mouth 3 (three) times daily. 90 tablet 2  . metoprolol tartrate (LOPRESSOR) 100 MG tablet Take 1 tablet by mouth twice daily 180 tablet 0  . nitroGLYCERIN (NITROSTAT) 0.4 MG SL tablet Place 1 tablet (0.4 mg total) under the tongue every 5 (five) minutes as needed for chest pain. 25 tablet 2  . pantoprazole (PROTONIX) 20 MG tablet Take 1 tablet (20 mg total) by mouth 2 (two) times daily. 180 tablet 2  . sacubitril-valsartan (ENTRESTO) 97-103 MG Take 1 tablet by mouth 2 (two) times daily. 60 tablet 3  . shark liver oil-cocoa butter (PREPARATION H) 0.25-3-85.5 % suppository  Place 1 suppository rectally 2 (two) times daily as needed for hemorrhoids.    . torsemide (DEMADEX) 20 MG tablet Take 20 mg by mouth daily as needed (swelling).     No current facility-administered medications for this visit.    Allergies Allergies  Allergen Reactions  . Codeine Anaphylaxis  . Crestor [Rosuvastatin] Other (See Comments)    Severe leg cramps  . Albuterol Swelling  .  Dilaudid [Hydromorphone] Itching and Swelling  . Isosorbide     Muscle pain and cramps  . Lexapro [Escitalopram]     Have bad thoughts made anxiety worse   . Lipitor [Atorvastatin Calcium] Other (See Comments)    myalgias  . Erythromycin Other (See Comments)    Hallucinations   . Oxycodone Itching and Rash    Swelling to face, hands, mouth Completley intolerant to any meds with oxycodone in it     Histories Past Medical History:  Diagnosis Date  . Arthritis    "a little in my right knee" (07/14/2016)  . CAD (coronary artery disease)   . Chronic congestive heart failure with left ventricular diastolic dysfunction ([place])   . Complication of anesthesia    Pt reports "they have a hard time waking me up"  . GERD (gastroesophageal reflux disease)    hx  . Headache    "with every heart issue that I have" (07/14/2016)  . High cholesterol   . History of hiatal hernia 1990s   "fixed it w/scope down my throat"  . Hypertension   . Myocardial infarction [facility]) 05/2013   stents: left PDA, 1st OM at [facility]  . Obesity   . Persistent atrial fibrillation ([place])   . Pneumonia ~ 1992  . QT prolongation 07/15/2016  . Seasonal allergies    "I take Allegra prn" (07/14/2016)  . Tobacco abuse 12/16/2016   Past Surgical History:  Procedure Laterality Date  . APPENDECTOMY  1984  . ATRIAL FIBRILLATION ABLATION N/A 03/01/2020   Procedure: ATRIAL FIBRILLATION ABLATION;  Surgeon: [name], MD;  Location: [place] CV LAB;  Service: Cardiovascular;  Laterality: N/A;  . CARDIOVERSION N/A 05/27/2016   Procedure: CARDIOVERSION;  Surgeon: [name], MD;  Location: [facility] ENDOSCOPY;  Service: Cardiovascular;  Laterality: N/A;  . CARDIOVERSION N/A 06/11/2016   Procedure: CARDIOVERSION;  Surgeon: [name], MD;  Location: [facility] ENDOSCOPY;  Service: Cardiovascular;  Laterality: N/A;  . CARDIOVERSION N/A 03/21/2020   Procedure: CARDIOVERSION;  Surgeon: Geralynn Rile, MD;  Location: [place];   Service: Cardiovascular;  Laterality: N/A;  . CORONARY ANGIOPLASTY WITH STENT PLACEMENT  05/30/2013   "2 stents put in at [facility]" & /stent card  . CORONARY STENT INTERVENTION N/A 11/27/2017   Procedure: CORONARY STENT INTERVENTION;  Surgeon: [name], MD;  Location: [place] CV LAB;  Service: Cardiovascular;  Laterality: N/A;  . CORONARY STENT INTERVENTION N/A 03/21/2019   Procedure: CORONARY STENT INTERVENTION;  Surgeon: [name], MD;  Location: [place] CV LAB;  Service: Cardiovascular;  Laterality: N/A;  . ELECTROPHYSIOLOGIC STUDY N/A 11/13/2016   Procedure: Atrial Fibrillation Ablation;  Surgeon: [name], MD;  Location: [place] CV LAB;  Service: Cardiovascular;  Laterality: N/A;  . ESOPHAGOGASTRODUODENOSCOPY (EGD) WITH ESOPHAGEAL DILATION  1990s X 2  . INTRAVASCULAR PRESSURE WIRE/FFR STUDY N/A 09/21/2018   Procedure: INTRAVASCULAR PRESSURE WIRE/FFR STUDY;  Surgeon: [name], MD;  Location: [place] CV LAB;  Service: Cardiovascular;  Laterality: N/A;  . INTRAVASCULAR PRESSURE WIRE/FFR STUDY N/A 03/21/2019   Procedure: INTRAVASCULAR PRESSURE WIRE/FFR  STUDY;  Surgeon: [name], MD;  Location: [place] CV LAB;  Service: Cardiovascular;  Laterality: N/A;  . INTRAVASCULAR PRESSURE WIRE/FFR STUDY N/A 01/24/2020   Procedure: INTRAVASCULAR PRESSURE WIRE/FFR STUDY;  Surgeon: [name], MD;  Location: [place] CV LAB;  Service: Cardiovascular;  Laterality: N/A;  . KNEE ARTHROSCOPY Right 1986; ~ 1995 X 2  . LAPAROSCOPIC CHOLECYSTECTOMY  2015  . LEFT HEART CATH AND CORONARY ANGIOGRAPHY N/A 11/27/2017   Procedure: LEFT HEART CATH AND CORONARY ANGIOGRAPHY;  Surgeon: [name], MD;  Location: [place] CV LAB;  Service: Cardiovascular;  Laterality: N/A;  . LEFT HEART CATH AND CORONARY ANGIOGRAPHY N/A 09/21/2018   Procedure: LEFT HEART CATH AND CORONARY ANGIOGRAPHY;  Surgeon: [name], MD;  Location: [place] CV LAB;  Service: Cardiovascular;  Laterality: N/A;  . LEFT  HEART CATH AND CORONARY ANGIOGRAPHY N/A 03/21/2019   Procedure: LEFT HEART CATH AND CORONARY ANGIOGRAPHY;  Surgeon: [name], MD;  Location: [place] CV LAB;  Service: Cardiovascular;  Laterality: N/A;  . REPAIR OF ESOPHAGUS  1998   perforation of the distal esophagus from bad heartburn  . RIGHT/LEFT HEART CATH AND CORONARY ANGIOGRAPHY N/A 01/24/2020   Procedure: RIGHT/LEFT HEART CATH AND CORONARY ANGIOGRAPHY;  Surgeon: [name], MD;  Location: [place] CV LAB;  Service: Cardiovascular;  Laterality: N/A;  . TEE WITHOUT CARDIOVERSION N/A 11/13/2016   Procedure: TRANSESOPHAGEAL ECHOCARDIOGRAM (TEE);  Surgeon: Thayer Headings, MD;  Location: [facility] ENDOSCOPY;  Service: Cardiovascular;  Laterality: N/A;   Social History   Socioeconomic History  . Marital status: Married    Spouse name: Not on file  . Number of children: 2  . Years of education: Not on file  . Highest education level: Not on file  Occupational History  . Not on file  Tobacco Use  . Smoking status: Former Smoker    Years: 25.00    Types: Cigarettes, E-cigarettes    Quit date: 07/06/2020    Years since quitting: 0.7  . Smokeless tobacco: Never Used  . Tobacco comment: Stopped smoking Sept 3, 2021  Vaping Use  . Vaping Use: Never used  Substance and Sexual Activity  . Alcohol use: No  . Drug use: No  . Sexual activity: Yes  Other Topics Concern  . Not on file  Social History Narrative   Pt lives in [place] with spouse and 15 year old son.   disabled   Social Determinants of Radio broadcast assistant Strain: Not on file  Food Insecurity: Not on file  Transportation Needs: Not on file  Physical Activity: Not on file  Stress: Not on file  Social Connections: Not on file  Intimate Partner Violence: Not on file   Family History  Problem Relation Age of Onset  . Heart disease Mother 57       CABG age 50  . Breast cancer Mother   . Tongue cancer Mother   . Diabetes Mother   . Heart attack Father 24        Father had around 7 heart attacks per pt  . Diabetes Father   . Lung cancer Father   . Congestive Heart Failure Father   . Lung cancer Paternal Uncle   . Colon cancer Neg Hx   . Esophageal cancer Neg Hx   . Inflammatory bowel disease Neg Hx   . Liver disease Neg Hx   . Pancreatic cancer Neg Hx   . Rectal cancer Neg Hx   . Stomach cancer Neg  Hx    I have reviewed his medical, social, and family history in detail and updated the electronic medical record as necessary.    PHYSICAL EXAMINATION  BP 124/88   Pulse 68 Comment: slightly irregular  Ht 5' 7.5" (1.715 m)   Wt 249 lb (112.9 kg)   BMI 38.42 kg/m  Wt Readings from Last 3 Encounters:  04/03/21 249 lb (112.9 kg)  03/14/21 250 lb (113.4 kg)  03/08/21 252 lb (114.3 kg)  GEN: NAD, appears stated age, doesn't appear chronically ill PSYCH: Cooperative, without pressured speech EYE: Conjunctivae pink, sclerae anicteric ENT: Masked CV: Nontachycardic RESP: No audible wheezing GI: NABS, rounded, obese, nontender, without rebound or guarding MSK/EXT: Lower extremity edema present SKIN: No jaundice NEURO:  Alert & Oriented x 3, no focal deficits   REVIEW OF DATA  I reviewed the following data at the time of this encounter:  GI Procedures and Studies  No relevant studies to review  Laboratory Studies  Reviewed those in epic  Imaging Studies  Previously reviewed   ASSESSMENT  Mr. Irving is a 55 y.o. male with a pmh significant for CAD (status post PCI), atrial fibrillation (status post ablation on Eliquis), hypertension, hyperlipidemia, CHF, obesity, allergies, tobacco use, hiatal hernia, GERD.  The patient is seen today for evaluation and management of:  1. Gastroesophageal reflux disease, unspecified whether esophagitis present   2. Colon cancer screening   3. Hiatal hernia   4. Atypical chest pain    The patient is clinically and hemodynamically stable at this time.  He has had significant improvement while being  on PPI therapy.  We will continue his current PPI dosing.  He will proceed with upper and lower endoscopy as previously discussed and scheduled.  We will hold his Eliquis as per cardiology approval.  Dilatation of the esophagus will be performed based on what is found at that time (at empiric dilation may be considered).  The risks and benefits of endoscopic evaluation were discussed with the patient; these include but are not limited to the risk of perforation, infection, bleeding, missed lesions, lack of diagnosis, severe illness requiring hospitalization, as well as anesthesia and sedation related illnesses.  The patient is agreeable to proceed.  All patient questions were answered to the best of my ability, and the patient agrees to the aforementioned plan of action with follow-up as indicated.   PLAN  Continue Protonix 20 mg twice daily Proceed with scheduled EGD/colonoscopy (empiric dilatation/esophageal biopsies at minimum) with colonoscopy for screening purposes Eliquis hold for 2 days prior to procedure Follow-up to be determined after completion of procedures   No orders of the defined types were placed in this encounter.   New Prescriptions   No medications on file   Modified Medications   No medications on file    Planned Follow Up No follow-ups on file.   Total Time in Face-to-Face and in Coordination of Care for patient including independent/personal interpretation/review of prior testing, medical history, examination, medication adjustment, communicating results with the patient directly, and documentation with the EHR is 25 minutes   Justice Britain, MD [facility] Advanced Endoscopy Office # [phone]

## 2021-04-03 NOTE — Patient Instructions (Signed)
,  If you are age 53 or older, your body mass index should be between 23-30. Your Body mass index is 38.42 kg/m. If this is out of the aforementioned range listed, please consider follow up with your Primary Care Provider.  If you are age 55 or younger, your body mass index should be between 19-25. Your Body mass index is 38.42 kg/m. If this is out of the aformentioned range listed, please consider follow up with your Primary Care Provider.   __________________________________________________________  The  GI providers would like to encourage you to use [facility] to communicate with providers for non-urgent requests or questions.  Due to long hold times on the telephone, sending your provider a message by [facility] may be a faster and more efficient way to get a response.  Please allow 48 business hours for a response.  Please remember that this is for non-urgent requests.   Thank you for choosing me and [facility].  Dr. Rush Landmark

## 2021-04-05 ENCOUNTER — Encounter: Payer: Self-pay | Admitting: Gastroenterology

## 2021-04-12 ENCOUNTER — Other Ambulatory Visit: Payer: Self-pay

## 2021-04-12 MED ORDER — NITROGLYCERIN 0.4 MG SL SUBL: 0.4000 mg | SUBLINGUAL_TABLET | SUBLINGUAL | 2 refills | Status: DC | PRN

## 2021-04-12 MED ORDER — PANTOPRAZOLE SODIUM 20 MG PO TBEC: 20.0000 mg | DELAYED_RELEASE_TABLET | Freq: Two times a day (BID) | ORAL | 2 refills | Status: DC

## 2021-04-12 MED ORDER — ENTRESTO 97-103 MG PO TABS: 1.0000 | ORAL_TABLET | Freq: Two times a day (BID) | ORAL | 3 refills | Status: DC

## 2021-04-17 DIAGNOSIS — H6691 Otitis media, unspecified, right ear: Secondary | ICD-10-CM | POA: Diagnosis not present

## 2021-04-18 ENCOUNTER — Telehealth: Payer: Self-pay | Admitting: Gastroenterology

## 2021-04-18 NOTE — Telephone Encounter (Signed)
The pt has strep throat and was put on antibiotics today. He has endoscopy on 6/24.  He has been advised to take his abx and call back if he is not better a few days prior to the procedure.  He should be able to proceed with procedure since it is 8 days out unless he does not improve.  He will call with an update.

## 2021-04-18 NOTE — Telephone Encounter (Signed)
Inbound call from patient requesting a call from a nurse please.  Has questions about his upcoming procedure and his recent office visit with his PCP where he was diagnosed with an ear  and sinus infection plus strep throat.

## 2021-04-19 ENCOUNTER — Telehealth: Payer: Self-pay | Admitting: Pulmonary Disease

## 2021-04-22 ENCOUNTER — Other Ambulatory Visit (HOSPITAL_COMMUNITY): Payer: Medicare Other

## 2021-04-24 ENCOUNTER — Encounter: Payer: Self-pay | Admitting: Pulmonary Disease

## 2021-04-24 ENCOUNTER — Ambulatory Visit (INDEPENDENT_AMBULATORY_CARE_PROVIDER_SITE_OTHER): Payer: Medicare Other | Admitting: Pulmonary Disease

## 2021-04-24 ENCOUNTER — Other Ambulatory Visit: Payer: Self-pay

## 2021-04-24 VITALS — BP 138/78 | HR 95 | Ht 67.5 in | Wt 250.0 lb

## 2021-04-24 DIAGNOSIS — R0602 Shortness of breath: Secondary | ICD-10-CM | POA: Diagnosis not present

## 2021-04-24 LAB — PULMONARY FUNCTION TEST
DL/VA % pred: 114 %
DL/VA: 5.1 ml/min/mmHg/L
DLCO cor % pred: 115 %
DLCO cor: 30.99 ml/min/mmHg
DLCO unc % pred: 115 %
DLCO unc: 30.99 ml/min/mmHg
FEF 25-75 Post: 5.46 L/sec
FEF 25-75 Pre: 3.12 L/sec
FEF2575-%Change-Post: 75 %
FEF2575-%Pred-Post: 173 %
FEF2575-%Pred-Pre: 99 %
FEV1-%Change-Post: 19 %
FEV1-%Pred-Post: 98 %
FEV1-%Pred-Pre: 82 %
FEV1-Post: 3.49 L
FEV1-Pre: 2.92 L
FEV1FVC-%Change-Post: 0 %
FEV1FVC-%Pred-Pre: 107 %
FEV6-%Change-Post: 20 %
FEV6-%Pred-Post: 95 %
FEV6-%Pred-Pre: 79 %
FEV6-Post: 4.24 L
FEV6-Pre: 3.52 L
FEV6FVC-%Pred-Post: 104 %
FEV6FVC-%Pred-Pre: 104 %
FVC-%Change-Post: 20 %
FVC-%Pred-Post: 92 %
FVC-%Pred-Pre: 76 %
FVC-Post: 4.24 L
FVC-Pre: 3.52 L
Post FEV1/FVC ratio: 82 %
Post FEV6/FVC ratio: 100 %
Pre FEV1/FVC ratio: 83 %
Pre FEV6/FVC Ratio: 100 %
RV % pred: 119 %
RV: 2.33 L
TLC % pred: 97 %
TLC: 6.31 L

## 2021-04-24 NOTE — Patient Instructions (Signed)
I will see in 6 months  Continue your weight loss efforts  Call if any significant concerns

## 2021-04-24 NOTE — Patient Instructions (Signed)
Full PFT performed today. °

## 2021-04-24 NOTE — Progress Notes (Signed)
Full PFT performed today. °

## 2021-04-24 NOTE — Progress Notes (Signed)
Danny Kelley    [id number]    1966/09/18  Primary Care Physician:[place], Angelique Blonder, MD  Referring Physician: Angelina Sheriff, MD [place],  [place] [id number]  Chief complaint:   Patient with shortness of breath  HPI:  He is stable since the last visit Still feels when he is about to lay down he does get a little short of breath but this does improve following laying down for a while  Continues to follow with cardiology History of coronary artery disease with stenting  Recently being treated for upper respiratory infection/sinusitis  Reformed smoker quit about a year ago  History of obstructive sleep apnea He stated his last 2 studies were negative for significant sleep disordered breathing  Since he has been trying to lose weight, he walks regularly, rides a bike regularly Able to tolerate a mile or more of walking  Admits to having some history of anxiety  No chest pains or chest discomfort No cough No symptoms suggesting an infection  Used to receive allergy shots as a teenager but not really having any significant symptoms suggesting allergies recently  He currently weighs about 250, down from about 300 at his heaviest weight  Outpatient Encounter Medications as of 04/24/2021  Medication Sig   acetaminophen (TYLENOL) 500 MG tablet Take 500 mg by mouth 2 (two) times daily as needed for moderate pain.   amLODipine (NORVASC) 10 MG tablet Take 1 tablet (10 mg total) by mouth daily.   apixaban (ELIQUIS) 5 MG TABS tablet Take 1 tablet (5 mg total) by mouth 2 (two) times daily.   bisacodyl (DULCOLAX) 5 MG EC tablet Take 5 mg by mouth daily as needed.    diltiazem (CARDIZEM) 30 MG tablet Take 1 tablet every 4 hours AS NEEDED for heart rate >110   docusate sodium (COLACE) 100 MG capsule Take 100 mg by mouth 2 (two) times daily as needed.    Evolocumab (REPATHA) 140 MG/ML SOSY Inject 1 mL into the skin every 14 (fourteen) days.   ezetimibe  (ZETIA) 10 MG tablet Take 1 tablet (10 mg total) by mouth daily.   fexofenadine (ALLEGRA) 180 MG tablet Take 180 mg by mouth daily as needed (seasonal allergies).    hydrALAZINE (APRESOLINE) 50 MG tablet Take 1 tablet (50 mg total) by mouth 3 (three) times daily.   metoprolol tartrate (LOPRESSOR) 100 MG tablet Take 1 tablet by mouth twice daily   nitroGLYCERIN (NITROSTAT) 0.4 MG SL tablet Place 1 tablet (0.4 mg total) under the tongue every 5 (five) minutes as needed for chest pain.   pantoprazole (PROTONIX) 20 MG tablet Take 1 tablet (20 mg total) by mouth 2 (two) times daily.   sacubitril-valsartan (ENTRESTO) 97-103 MG Take 1 tablet by mouth 2 (two) times daily.   shark liver oil-cocoa butter (PREPARATION H) 0.25-3-85.5 % suppository Place 1 suppository rectally 2 (two) times daily as needed for hemorrhoids.   torsemide (DEMADEX) 20 MG tablet Take 20 mg by mouth daily as needed (swelling).   No facility-administered encounter medications on file as of 04/24/2021.    Allergies as of 04/24/2021 - Review Complete 04/24/2021  Allergen Reaction Noted   Codeine Anaphylaxis 03/12/2020   Crestor [rosuvastatin] Other (See Comments) 09/29/2016   Albuterol Swelling 03/14/2021   Dilaudid [hydromorphone] Itching and Swelling 09/16/2018   Isosorbide  09/16/2018   Lexapro [escitalopram]  05/03/2019   Lipitor [atorvastatin calcium] Other (See Comments) 11/27/2017   Erythromycin  Other (See Comments) 05/27/2016   Oxycodone Itching and Rash 11/27/2017    Past Medical History:  Diagnosis Date   Arthritis    "a little in my right knee" (07/14/2016)   CAD (coronary artery disease)    Chronic congestive heart failure with left ventricular diastolic dysfunction (HCC)    Complication of anesthesia    Pt reports "they have a hard time waking me up"   GERD (gastroesophageal reflux disease)    hx   Headache    "with every heart issue that I have" (07/14/2016)   High cholesterol    History of hiatal hernia  1990s   "fixed it w/scope down my throat"   Hypertension    Myocardial infarction ([place]) 05/2013   stents: left PDA, 1st OM at [facility]   Obesity    Persistent atrial fibrillation ([place])    Pneumonia ~ 1992   QT prolongation 07/15/2016   Seasonal allergies    "I take Allegra prn" (07/14/2016)   Tobacco abuse 12/16/2016    Past Surgical History:  Procedure Laterality Date   APPENDECTOMY  1984   ATRIAL FIBRILLATION ABLATION N/A 03/01/2020   Procedure: ATRIAL FIBRILLATION ABLATION;  Surgeon: [name], MD;  Location: [place] [facility];  Service: Cardiovascular;  Laterality: N/A;   CARDIOVERSION N/A 05/27/2016   Procedure: CARDIOVERSION;  Surgeon: [name], MD;  Location: [facility];  Service: Cardiovascular;  Laterality: N/A;   CARDIOVERSION N/A 06/11/2016   Procedure: CARDIOVERSION;  Surgeon: [name], MD;  Location: [place];  Service: Cardiovascular;  Laterality: N/A;   CARDIOVERSION N/A 03/21/2020   Procedure: CARDIOVERSION;  Surgeon: Geralynn Rile, MD;  Location: [place];  Service: Cardiovascular;  Laterality: N/A;   CORONARY ANGIOPLASTY WITH STENT PLACEMENT  05/30/2013   "2 stents put in at [facility]" & /stent card   CORONARY STENT INTERVENTION N/A 11/27/2017   Procedure: CORONARY STENT INTERVENTION;  Surgeon: [name], MD;  Location: [place] [facility];  Service: Cardiovascular;  Laterality: N/A;   CORONARY STENT INTERVENTION N/A 03/21/2019   Procedure: CORONARY STENT INTERVENTION;  Surgeon: [name], MD;  Location: [place] [facility];  Service: Cardiovascular;  Laterality: N/A;   ELECTROPHYSIOLOGIC STUDY N/A 11/13/2016   Procedure: Atrial Fibrillation Ablation;  Surgeon: [name], MD;  Location: [place] [facility];  Service: Cardiovascular;  Laterality: N/A;   ESOPHAGOGASTRODUODENOSCOPY (EGD) WITH ESOPHAGEAL DILATION  1990s X 2   INTRAVASCULAR PRESSURE WIRE/FFR STUDY N/A 09/21/2018   Procedure: INTRAVASCULAR PRESSURE WIRE/FFR STUDY;  Surgeon:  [name], MD;  Location: [place] [facility];  Service: Cardiovascular;  Laterality: N/A;   INTRAVASCULAR PRESSURE WIRE/FFR STUDY N/A 03/21/2019   Procedure: INTRAVASCULAR PRESSURE WIRE/FFR STUDY;  Surgeon: [name], MD;  Location: [place] [facility];  Service: Cardiovascular;  Laterality: N/A;   INTRAVASCULAR PRESSURE WIRE/FFR STUDY N/A 01/24/2020   Procedure: INTRAVASCULAR PRESSURE WIRE/FFR STUDY;  Surgeon: [name], MD;  Location: [place] [facility];  Service: Cardiovascular;  Laterality: N/A;   KNEE ARTHROSCOPY Right 1986; ~ 1995 X [place]  2015   LEFT HEART CATH AND CORONARY ANGIOGRAPHY N/A 11/27/2017   Procedure: LEFT HEART CATH AND CORONARY ANGIOGRAPHY;  Surgeon: [name], MD;  Location: [place] [facility];  Service: Cardiovascular;  Laterality: N/A;   LEFT HEART CATH AND CORONARY ANGIOGRAPHY N/A 09/21/2018   Procedure: LEFT HEART CATH AND CORONARY ANGIOGRAPHY;  Surgeon: [name], MD;  Location: [place] [facility];  Service: Cardiovascular;  Laterality: N/A;  LEFT HEART CATH AND CORONARY ANGIOGRAPHY N/A 03/21/2019   Procedure: LEFT HEART CATH AND CORONARY ANGIOGRAPHY;  Surgeon: [name], MD;  Location: [place] [facility];  Service: Cardiovascular;  Laterality: N/A;   REPAIR OF ESOPHAGUS  1998   perforation of the distal esophagus from bad heartburn   RIGHT/LEFT HEART CATH AND CORONARY ANGIOGRAPHY N/A 01/24/2020   Procedure: RIGHT/LEFT HEART CATH AND CORONARY ANGIOGRAPHY;  Surgeon: [name], MD;  Location: [place] [facility];  Service: Cardiovascular;  Laterality: N/A;   TEE WITHOUT CARDIOVERSION N/A 11/13/2016   Procedure: TRANSESOPHAGEAL ECHOCARDIOGRAM (TEE);  Surgeon: Thayer Headings, MD;  Location: [facility] ENDOSCOPY;  Service: Cardiovascular;  Laterality: N/A;    Family History  Problem Relation Age of Onset   Heart disease Mother 49       CABG age 90   Breast cancer Mother    Tongue cancer Mother    Diabetes Mother    Heart attack Father 81        Father had around 7 heart attacks per pt   Diabetes Father    Lung cancer Father    Congestive Heart Failure Father    Lung cancer Paternal Uncle    Colon cancer Neg Hx    Esophageal cancer Neg Hx    Inflammatory bowel disease Neg Hx    Liver disease Neg Hx    Pancreatic cancer Neg Hx    Rectal cancer Neg Hx    Stomach cancer Neg Hx     Social History   Socioeconomic History   Marital status: Married    Spouse name: Not on file   Number of children: 2   Years of education: Not on file   Highest education level: Not on file  Occupational History   Not on file  Tobacco Use   Smoking status: Former    Years: 25.00    Pack years: 0.00    Types: Cigarettes, E-cigarettes    Start date: 1989    Quit date: 07/06/2020    Years since quitting: 0.8   Smokeless tobacco: Never   Tobacco comments:    Stopped smoking Sept 3, 2021  Vaping Use   Vaping Use: Never used  Substance and Sexual Activity   Alcohol use: No   Drug use: No   Sexual activity: Yes  Other Topics Concern   Not on file  Social History Narrative   Pt lives in [place] with spouse and 24 year old son.   disabled   Social Determinants of Radio broadcast assistant Strain: Not on file  Food Insecurity: Not on file  Transportation Needs: Not on file  Physical Activity: Not on file  Stress: Not on file  Social Connections: Not on file  Intimate Partner Violence: Not on file    Review of Systems  Constitutional:  Negative for fatigue.  Respiratory:  Positive for shortness of breath.   Psychiatric/Behavioral:  Negative for sleep disturbance.    Vitals:   04/24/21 1545  BP: 138/78  Pulse: 95  SpO2: 96%     Physical Exam Constitutional:      Appearance: He is obese.  HENT:     Head: Normocephalic.     Right Ear: Tympanic membrane normal.     Left Ear: Tympanic membrane normal.     Nose: Nose normal.     Mouth/Throat:     Mouth: Mucous membranes are moist.  Eyes:     Pupils: Pupils are equal,  round, and reactive to light.  Cardiovascular:     Rate and Rhythm: Normal rate and regular rhythm.     Pulses: Normal pulses.     Heart sounds: Normal heart sounds. No murmur heard.   No friction rub.  Pulmonary:     Effort: Pulmonary effort is normal. No respiratory distress.     Breath sounds: Normal breath sounds. No stridor. No wheezing or rhonchi.  Musculoskeletal:     Cervical back: No rigidity or tenderness.  Neurological:     Mental Status: He is alert.  Psychiatric:        Mood and Affect: Mood normal.   Data Reviewed: Chest x-ray within the last couple of weeks-according to patient within normal limits--film or results results not available to me at present  PFT reviewed with the patient showing no significant obstruction, there is significant bronchodilator response consistent with airway hyperactivity, normal lung volume, normal diffusing capacity.  Assessment:  Shortness of breath  Reformed smoker -PFT with no significant obstructive disease -There is presence of hyperactivity although, just recovering from a URI  No underlying lung disease known to patient  History of hypertension, diastolic heart failure, coronary artery disease, paroxysmal atrial fibrillation-feels symptoms are stable at present -Continue to follow-up with cardiology  Past history of obstructive sleep apnea  Plan/Recommendations: Continue graded exercise as tolerated  Encouraged to call with any significant concerns  I will see him back in 6 months -Continues to deny significant sleep issues, sleep is restorative -No daytime sleepiness    Sherrilyn Rist MD [facility] 04/24/2021, 4:14 PM  CC: Angelina Sheriff, MD

## 2021-04-25 NOTE — Telephone Encounter (Signed)
PFT and OV were completed on 6/22. Will close encounter.

## 2021-04-26 ENCOUNTER — Ambulatory Visit (AMBULATORY_SURGERY_CENTER): Payer: Medicare Other | Admitting: Gastroenterology

## 2021-04-26 ENCOUNTER — Other Ambulatory Visit: Payer: Self-pay

## 2021-04-26 ENCOUNTER — Encounter: Payer: Self-pay | Admitting: Gastroenterology

## 2021-04-26 VITALS — BP 124/69 | HR 69 | Temp 98.7°F | Resp 17 | Ht 67.5 in | Wt 249.0 lb

## 2021-04-26 DIAGNOSIS — K2970 Gastritis, unspecified, without bleeding: Secondary | ICD-10-CM

## 2021-04-26 DIAGNOSIS — Z1211 Encounter for screening for malignant neoplasm of colon: Secondary | ICD-10-CM | POA: Diagnosis not present

## 2021-04-26 DIAGNOSIS — R12 Heartburn: Secondary | ICD-10-CM

## 2021-04-26 DIAGNOSIS — D127 Benign neoplasm of rectosigmoid junction: Secondary | ICD-10-CM

## 2021-04-26 DIAGNOSIS — D125 Benign neoplasm of sigmoid colon: Secondary | ICD-10-CM

## 2021-04-26 DIAGNOSIS — K635 Polyp of colon: Secondary | ICD-10-CM

## 2021-04-26 DIAGNOSIS — K219 Gastro-esophageal reflux disease without esophagitis: Secondary | ICD-10-CM

## 2021-04-26 DIAGNOSIS — K2270 Barrett's esophagus without dysplasia: Secondary | ICD-10-CM | POA: Diagnosis not present

## 2021-04-26 DIAGNOSIS — D124 Benign neoplasm of descending colon: Secondary | ICD-10-CM

## 2021-04-26 DIAGNOSIS — K319 Disease of stomach and duodenum, unspecified: Secondary | ICD-10-CM | POA: Diagnosis not present

## 2021-04-26 MED ORDER — SODIUM CHLORIDE 0.9 % IV SOLN: 500.0000 mL | Freq: Once | INTRAVENOUS | Status: DC

## 2021-04-26 NOTE — Op Note (Signed)
[place] Patient Name: Danny Kelley Procedure Date: 04/26/2021 1:59 PM MRN: [id number] Endoscopist: Justice Britain , MD Age: 55 Referring MD:  Date of Birth: Dec 09, 1965 Gender: Male Account #: [id number] Procedure:                Upper GI endoscopy Indications:              Heartburn, Screening for Barrett's esophagus in                            patient at risk for this condition, Hiatal hernia Medicines:                Monitored Anesthesia Care Procedure:                Pre-Anesthesia Assessment:                           - Prior to the procedure, a History and Physical                            was performed, and patient medications and                            allergies were reviewed. The patient's tolerance of                            previous anesthesia was also reviewed. The risks                            and benefits of the procedure and the sedation                            options and risks were discussed with the patient.                            All questions were answered, and informed consent                            was obtained. Prior Anticoagulants: The patient has                            taken Eliquis (apixaban), last dose was 2 days                            prior to procedure. ASA Grade Assessment: III - A                            patient with severe systemic disease. After                            reviewing the risks and benefits, the patient was                            deemed in satisfactory condition to undergo the  procedure.                           After obtaining informed consent, the endoscope was                            passed under direct vision. Throughout the                            procedure, the patient's blood pressure, pulse, and                            oxygen saturations were monitored continuously. The                            GIF [id number] #[id number] was introduced through  the                            mouth, and advanced to the second part of duodenum.                            The upper GI endoscopy was accomplished without                            difficulty. The patient tolerated the procedure. Scope In: Scope Out: Findings:                 No gross lesions were noted in the entire esophagus                            proximally.                           One tongue of salmon-colored mucosa was present                            from 36 to 38 cm. No other visible abnormalities                            were present. Biopsies were taken with a cold                            forceps for histology.                           The Z-line was irregular and was found 36 cm from                            the incisors.                           A 3 cm hiatal hernia was present.                           Patchy mildly erythematous mucosa without bleeding  was found in the entire examined stomach. Biopsies                            were taken with a cold forceps for histology and                            Helicobacter pylori testing.                           No gross lesions were noted in the duodenal bulb,                            in the first portion of the duodenum and in the                            second portion of the duodenum. Complications:            No immediate complications. Estimated Blood Loss:     Estimated blood loss was minimal. Impression:               - No gross lesions in esophagus proximally.                           - Salmon-colored mucosa suspicious for Barrett's                            esophagus distally. Biopsied.                           - Z-line irregular, 36 cm from the incisors.                           - 3 cm hiatal hernia.                           - Erythematous mucosa in the stomach. Biopsied.                           - No gross lesions in the duodenal bulb, in the                             first portion of the duodenum and in the second                            portion of the duodenum. Recommendation:           - Proceed to scheduled colonoscopy.                           - Await pathology results.                           - Repeat upper endoscopy for surveillance based on                            pathology results  and findings of Barrett's                            esophagus.                           - The findings and recommendations were discussed                            with the patient.                           - The findings and recommendations were discussed                            with the patient's family. Justice Britain, MD 04/26/2021 2:42:06 PM

## 2021-04-26 NOTE — Patient Instructions (Signed)
Thank you for allowing [place] to care for you today!  Await final pathology results approximately 7-10 days.  Will make recommendations at that time for future colonoscopy.  Resume previous previous diet and medications today.  Recommend following high fiber diet and using FIber-Con 1-2 tablets by mouth daily.  May re-start Eliquis in 48 hours ( 6/26 pm ).  Resume previous diet and medications today.  Return to your normal daily activities tomorrow.    YOU HAD AN ENDOSCOPIC PROCEDURE TODAY AT [facility]:   Refer to the procedure report that was given to you for any specific questions about what was found during the examination.  If the procedure report does not answer your questions, please call your gastroenterologist to clarify.  If you requested that your care partner not be given the details of your procedure findings, then the procedure report has been included in a sealed envelope for you to review at your convenience later.  YOU SHOULD EXPECT: Some feelings of bloating in the abdomen. Passage of more gas than usual.  Walking can help get rid of the air that was put into your GI tract during the procedure and reduce the bloating. If you had a lower endoscopy (such as a colonoscopy or flexible sigmoidoscopy) you may notice spotting of blood in your stool or on the toilet paper. If you underwent a bowel prep for your procedure, you may not have a normal bowel movement for a few days.  Please Note:  You might notice some irritation and congestion in your nose or some drainage.  This is from the oxygen used during your procedure.  There is no need for concern and it should clear up in a day or so.  SYMPTOMS TO REPORT IMMEDIATELY:  Following lower endoscopy (colonoscopy or flexible sigmoidoscopy):  Excessive amounts of blood in the stool  Significant tenderness or worsening of abdominal pains  Swelling of the abdomen that is new, acute  Fever of 100F or higher  Following  upper endoscopy (EGD)  Vomiting of blood or coffee ground material  New chest pain or pain under the shoulder blades  Painful or persistently difficult swallowing  New shortness of breath  Fever of 100F or higher  Black, tarry-looking stools  For urgent or emergent issues, a gastroenterologist can be reached at any hour by calling [phone]. Do not use MyChart messaging for urgent concerns.    DIET:  We do recommend a small meal at first, but then you may proceed to your regular diet.  Drink plenty of fluids but you should avoid alcoholic beverages for 24 hours.  ACTIVITY:  You should plan to take it easy for the rest of today and you should NOT DRIVE or use heavy machinery until tomorrow (because of the sedation medicines used during the test).    FOLLOW UP: Our staff will call the number listed on your records 48-72 hours following your procedure to check on you and address any questions or concerns that you may have regarding the information given to you following your procedure. If we do not reach you, we will leave a message.  We will attempt to reach you two times.  During this call, we will ask if you have developed any symptoms of COVID 19. If you develop any symptoms (ie: fever, flu-like symptoms, shortness of breath, cough etc.) before then, please call [phone].  If you test positive for Covid 19 in the 2 weeks post procedure, please call and report this  information to [place].    If any biopsies were taken you will be contacted by phone or by letter within the next 1-3 weeks.  Please call us at [phone] if you have not heard about the biopsies in 3 weeks.    SIGNATURES/CONFIDENTIALITY: You and/or your care partner have signed paperwork which will be entered into your electronic medical record.  These signatures attest to the fact that that the information above on your After Visit Summary has been reviewed and is understood.  Full responsibility of the confidentiality  of this discharge information lies with you and/or your care-partner. YOU HAD AN ENDOSCOPIC PROCEDURE TODAY AT [facility]:   Refer to the procedure report that was given to you for any specific questions about what was found during the examination.  If the procedure report does not answer your questions, please call your gastroenterologist to clarify.  If you requested that your care partner not be given the details of your procedure findings, then the procedure report has been included in a sealed envelope for you to review at your convenience later.  YOU SHOULD EXPECT: Some feelings of bloating in the abdomen. Passage of more gas than usual.  Walking can help get rid of the air that was put into your GI tract during the procedure and reduce the bloating. If you had a lower endoscopy (such as a colonoscopy or flexible sigmoidoscopy) you may notice spotting of blood in your stool or on the toilet paper. If you underwent a bowel prep for your procedure, you may not have a normal bowel movement for a few days.  Please Note:  You might notice some irritation and congestion in your nose or some drainage.  This is from the oxygen used during your procedure.  There is no need for concern and it should clear up in a day or so.  SYMPTOMS TO REPORT IMMEDIATELY:  Following lower endoscopy (colonoscopy or flexible sigmoidoscopy):  Excessive amounts of blood in the stool  Significant tenderness or worsening of abdominal pains  Swelling of the abdomen that is new, acute  Fever of 100F or higher  Following upper endoscopy (EGD)  Vomiting of blood or coffee ground material  New chest pain or pain under the shoulder blades  Painful or persistently difficult swallowing  New shortness of breath  Fever of 100F or higher  Black, tarry-looking stools  For urgent or emergent issues, a gastroenterologist can be reached at any hour by calling [phone]. Do not use MyChart messaging for urgent  concerns.    DIET:  We do recommend a small meal at first, but then you may proceed to your regular diet.  Drink plenty of fluids but you should avoid alcoholic beverages for 24 hours.  ACTIVITY:  You should plan to take it easy for the rest of today and you should NOT DRIVE or use heavy machinery until tomorrow (because of the sedation medicines used during the test).    FOLLOW UP: Our staff will call the number listed on your records 48-72 hours following your procedure to check on you and address any questions or concerns that you may have regarding the information given to you following your procedure. If we do not reach you, we will leave a message.  We will attempt to reach you two times.  During this call, we will ask if you have developed any symptoms of COVID 19. If you develop any symptoms (ie: fever, flu-like symptoms, shortness of breath, cough  etc.) before then, please call [phone].  If you test positive for Covid 19 in the 2 weeks post procedure, please call and report this information to [place].    If any biopsies were taken you will be contacted by phone or by letter within the next 1-3 weeks.  Please call us at [phone] if you have not heard about the biopsies in 3 weeks.    SIGNATURES/CONFIDENTIALITY: You and/or your care partner have signed paperwork which will be entered into your electronic medical record.  These signatures attest to the fact that that the information above on your After Visit Summary has been reviewed and is understood.  Full responsibility of the confidentiality of this discharge information lies with you and/or your care-partner.

## 2021-04-26 NOTE — Progress Notes (Signed)
A/ox3, pleased with MAC, report to RN 

## 2021-04-26 NOTE — Op Note (Signed)
[facility] Patient Name: Danny Kelley Procedure Date: 04/26/2021 1:59 PM MRN: [id number] Endoscopist: Justice Britain , MD Age: 55 Referring MD:  Date of Birth: 1966/02/12 Gender: Male Account #: [id number] Procedure:                Colonoscopy Indications:              Screening for colorectal malignant neoplasm Medicines:                Monitored Anesthesia Care Procedure:                Pre-Anesthesia Assessment:                           - Prior to the procedure, a History and Physical                            was performed, and patient medications and                            allergies were reviewed. The patient's tolerance of                            previous anesthesia was also reviewed. The risks                            and benefits of the procedure and the sedation                            options and risks were discussed with the patient.                            All questions were answered, and informed consent                            was obtained. Prior Anticoagulants: The patient has                            taken Eliquis (apixaban), last dose was 2 days                            prior to procedure. ASA Grade Assessment: III - A                            patient with severe systemic disease. After                            reviewing the risks and benefits, the patient was                            deemed in satisfactory condition to undergo the                            procedure.  After obtaining informed consent, the colonoscope                            was passed under direct vision. Throughout the                            procedure, the patient's blood pressure, pulse, and                            oxygen saturations were monitored continuously. The                            CF [id number] #[id number] was introduced through the anus                            and advanced to the 5 cm into the ileum. The                             colonoscopy was performed without difficulty. The                            patient tolerated the procedure. The quality of the                            bowel preparation was adequate. The terminal ileum,                            ileocecal valve, appendiceal orifice, and rectum                            were photographed. Scope In: 2:17:47 PM Scope Out: 2:32:34 PM Scope Withdrawal Time: 0 hours 10 minutes 51 seconds  Total Procedure Duration: 0 hours 14 minutes 47 seconds  Findings:                 The digital rectal exam findings include                            hemorrhoids. Pertinent negatives include no                            palpable rectal lesions.                           A moderate amount of semi-liquid stool was found in                            the entire colon, interfering with visualization.                            Lavage of the area was performed using copious                            amounts, resulting in clearance with adequate  visualization.                           The terminal ileum and ileocecal valve appeared                            normal.                           Three sessile polyps were found in the                            recto-sigmoid colon, sigmoid colon and descending                            colon. The polyps were 2 to 6 mm in size. These                            polyps were removed with a cold snare. Resection                            and retrieval were complete.                           Multiple small-mouthed diverticula were found in                            the recto-sigmoid colon, sigmoid colon and                            descending colon.                           Normal mucosa was found in the entire colon                            otherwise.                           Non-bleeding non-thrombosed external and internal                            hemorrhoids were  found during retroflexion, during                            perianal exam and during digital exam. The                            hemorrhoids were Grade II (internal hemorrhoids                            that prolapse but reduce spontaneously). Complications:            No immediate complications. Estimated Blood Loss:     Estimated blood loss was minimal. Impression:               - Hemorrhoids found on digital rectal  exam.                           - Stool in the entire examined colon.                           - The examined portion of the ileum was normal.                           - Three 2 to 6 mm polyps at the recto-sigmoid                            colon, in the sigmoid colon and in the descending                            colon, removed with a cold snare. Resected and                            retrieved.                           - Diverticulosis in the recto-sigmoid colon, in the                            sigmoid colon and in the descending colon.                           - Normal mucosa in the entire examined colon                            otherwise.                           - Non-bleeding non-thrombosed external and internal                            hemorrhoids. Recommendation:           - The patient will be observed post-procedure,                            until all discharge criteria are met.                           - Discharge patient to home.                           - Patient has a contact number available for                            emergencies. The signs and symptoms of potential                            delayed complications were discussed with the  patient. Return to normal activities tomorrow.                            Written discharge instructions were provided to the                            patient.                           - High fiber diet.                           - Use FiberCon 1-2 tablets PO daily.                            - May restart Eliquis in 48 hours (6/26 PM) to                            decrease risk of post-interventional bleeding.                           - Continue present medications.                           - The findings and recommendations were discussed                            with the patient.                           - The findings and recommendations were discussed                            with the patient's family. Justice Britain, MD 04/26/2021 2:46:34 PM

## 2021-04-26 NOTE — Progress Notes (Signed)
Called to room to assist during endoscopic procedure.  Patient ID and intended procedure confirmed with present staff. Received instructions for my participation in the procedure from the performing physician.  

## 2021-04-26 NOTE — Progress Notes (Signed)
Medical history reviewed with no changes noted. VS assessed by C.W 

## 2021-04-30 ENCOUNTER — Telehealth: Payer: Self-pay | Admitting: *Deleted

## 2021-04-30 NOTE — Telephone Encounter (Signed)
Message left

## 2021-04-30 NOTE — Telephone Encounter (Signed)
Attempted 2nd f/u phone call. No answer. Left message.  °

## 2021-05-02 DIAGNOSIS — I10 Essential (primary) hypertension: Secondary | ICD-10-CM | POA: Diagnosis not present

## 2021-05-09 ENCOUNTER — Encounter: Payer: Self-pay | Admitting: Gastroenterology

## 2021-05-16 ENCOUNTER — Telehealth: Payer: Self-pay

## 2021-05-16 ENCOUNTER — Encounter (HOSPITAL_COMMUNITY): Payer: Self-pay

## 2021-05-16 ENCOUNTER — Emergency Department (HOSPITAL_COMMUNITY)
Admission: EM | Admit: 2021-05-16 | Discharge: 2021-05-16 | Disposition: A | Discharge status: Home / Self Care | Payer: Medicare Other | Attending: Emergency Medicine | Admitting: Emergency Medicine

## 2021-05-16 ENCOUNTER — Emergency Department (HOSPITAL_COMMUNITY): Payer: Medicare Other

## 2021-05-16 DIAGNOSIS — I63532 Cerebral infarction due to unspecified occlusion or stenosis of left posterior cerebral artery: Secondary | ICD-10-CM | POA: Diagnosis not present

## 2021-05-16 DIAGNOSIS — Z79899 Other long term (current) drug therapy: Secondary | ICD-10-CM | POA: Insufficient documentation

## 2021-05-16 DIAGNOSIS — Z7901 Long term (current) use of anticoagulants: Secondary | ICD-10-CM | POA: Insufficient documentation

## 2021-05-16 DIAGNOSIS — I110 Hypertensive heart disease with heart failure: Secondary | ICD-10-CM | POA: Diagnosis not present

## 2021-05-16 DIAGNOSIS — I63512 Cerebral infarction due to unspecified occlusion or stenosis of left middle cerebral artery: Secondary | ICD-10-CM | POA: Diagnosis not present

## 2021-05-16 DIAGNOSIS — G459 Transient cerebral ischemic attack, unspecified: Secondary | ICD-10-CM | POA: Diagnosis not present

## 2021-05-16 DIAGNOSIS — I4891 Unspecified atrial fibrillation: Secondary | ICD-10-CM | POA: Diagnosis not present

## 2021-05-16 DIAGNOSIS — Z87891 Personal history of nicotine dependence: Secondary | ICD-10-CM | POA: Insufficient documentation

## 2021-05-16 DIAGNOSIS — I63233 Cerebral infarction due to unspecified occlusion or stenosis of bilateral carotid arteries: Secondary | ICD-10-CM | POA: Diagnosis not present

## 2021-05-16 DIAGNOSIS — R200 Anesthesia of skin: Secondary | ICD-10-CM | POA: Diagnosis not present

## 2021-05-16 DIAGNOSIS — I5032 Chronic diastolic (congestive) heart failure: Secondary | ICD-10-CM | POA: Insufficient documentation

## 2021-05-16 DIAGNOSIS — R519 Headache, unspecified: Secondary | ICD-10-CM | POA: Insufficient documentation

## 2021-05-16 DIAGNOSIS — I2510 Atherosclerotic heart disease of native coronary artery without angina pectoris: Secondary | ICD-10-CM | POA: Insufficient documentation

## 2021-05-16 DIAGNOSIS — G4489 Other headache syndrome: Secondary | ICD-10-CM | POA: Diagnosis not present

## 2021-05-16 DIAGNOSIS — R404 Transient alteration of awareness: Secondary | ICD-10-CM | POA: Diagnosis not present

## 2021-05-16 DIAGNOSIS — I10 Essential (primary) hypertension: Secondary | ICD-10-CM | POA: Diagnosis not present

## 2021-05-16 DIAGNOSIS — Z743 Need for continuous supervision: Secondary | ICD-10-CM | POA: Diagnosis not present

## 2021-05-16 DIAGNOSIS — H579 Unspecified disorder of eye and adnexa: Secondary | ICD-10-CM | POA: Diagnosis not present

## 2021-05-16 DIAGNOSIS — R1111 Vomiting without nausea: Secondary | ICD-10-CM | POA: Diagnosis not present

## 2021-05-16 DIAGNOSIS — R202 Paresthesia of skin: Secondary | ICD-10-CM | POA: Diagnosis not present

## 2021-05-16 LAB — DIFFERENTIAL
Abs Immature Granulocytes: 0.06 10*3/uL (ref 0.00–0.07)
Basophils Absolute: 0.1 10*3/uL (ref 0.0–0.1)
Basophils Relative: 1 %
Eosinophils Absolute: 0.2 10*3/uL (ref 0.0–0.5)
Eosinophils Relative: 2 %
Immature Granulocytes: 1 %
Lymphocytes Relative: 20 %
Lymphs Abs: 2 10*3/uL (ref 0.7–4.0)
Monocytes Absolute: 0.7 10*3/uL (ref 0.1–1.0)
Monocytes Relative: 7 %
Neutro Abs: 6.9 10*3/uL (ref 1.7–7.7)
Neutrophils Relative %: 69 %

## 2021-05-16 LAB — I-STAT CHEM 8, ED
BUN: 11 mg/dL (ref 6–20)
Calcium, Ion: 1.13 mmol/L — ABNORMAL LOW (ref 1.15–1.40)
Chloride: 101 mmol/L (ref 98–111)
Creatinine, Ser: 1 mg/dL (ref 0.61–1.24)
Glucose, Bld: 158 mg/dL — ABNORMAL HIGH (ref 70–99)
HCT: 46 % (ref 39.0–52.0)
Hemoglobin: 15.6 g/dL (ref 13.0–17.0)
Potassium: 3.4 mmol/L — ABNORMAL LOW (ref 3.5–5.1)
Sodium: 137 mmol/L (ref 135–145)
TCO2: 25 mmol/L (ref 22–32)

## 2021-05-16 LAB — COMPREHENSIVE METABOLIC PANEL
ALT: 25 U/L (ref 0–44)
AST: 20 U/L (ref 15–41)
Albumin: 2.9 g/dL — ABNORMAL LOW (ref 3.5–5.0)
Alkaline Phosphatase: 98 U/L (ref 38–126)
Anion gap: 7 (ref 5–15)
BUN: 10 mg/dL (ref 6–20)
CO2: 25 mmol/L (ref 22–32)
Calcium: 8.5 mg/dL — ABNORMAL LOW (ref 8.9–10.3)
Chloride: 103 mmol/L (ref 98–111)
Creatinine, Ser: 1.03 mg/dL (ref 0.61–1.24)
GFR, Estimated: >60 mL/min (ref >60)
Glucose, Bld: 164 mg/dL — ABNORMAL HIGH (ref 70–99)
Potassium: 3.3 mmol/L — ABNORMAL LOW (ref 3.5–5.1)
Sodium: 135 mmol/L (ref 135–145)
Total Bilirubin: 0.3 mg/dL (ref 0.3–1.2)
Total Protein: 6.5 g/dL (ref 6.5–8.1)

## 2021-05-16 LAB — CBC
HCT: 47 % (ref 39.0–52.0)
Hemoglobin: 15.9 g/dL (ref 13.0–17.0)
MCH: 28 pg (ref 26.0–34.0)
MCHC: 33.8 g/dL (ref 30.0–36.0)
MCV: 82.9 fL (ref 80.0–100.0)
Platelets: 305 10*3/uL (ref 150–400)
RBC: 5.67 MIL/uL (ref 4.22–5.81)
RDW: 13.4 % (ref 11.5–15.5)
WBC: 9.9 10*3/uL (ref 4.0–10.5)
nRBC: 0 % (ref 0.0–0.2)

## 2021-05-16 LAB — PROTIME-INR
INR: 1.1 (ref 0.8–1.2)
Prothrombin Time: 13.9 seconds (ref 11.4–15.2)

## 2021-05-16 LAB — CBG MONITORING, ED: Glucose-Capillary: 158 mg/dL — ABNORMAL HIGH (ref 70–99)

## 2021-05-16 LAB — APTT: aPTT: 28 seconds (ref 24–36)

## 2021-05-16 MED ORDER — SODIUM CHLORIDE 0.9 % IV BOLUS
1000.0000 mL | Freq: Once | INTRAVENOUS | Status: AC
  Administered 2021-05-16: 1000 mL via INTRAVENOUS

## 2021-05-16 MED ORDER — POTASSIUM CHLORIDE 10 MEQ/100ML IV SOLN
10.0000 meq | INTRAVENOUS | Status: AC
  Administered 2021-05-16 (×2): 10 meq via INTRAVENOUS
  Filled 2021-05-16 (×2): qty 100

## 2021-05-16 MED ORDER — MAGNESIUM SULFATE 2 GM/50ML IV SOLN
2.0000 g | Freq: Once | INTRAVENOUS | Status: AC
  Administered 2021-05-16: 2 g via INTRAVENOUS
  Filled 2021-05-16: qty 50

## 2021-05-16 MED ORDER — SODIUM CHLORIDE 0.9% FLUSH
3.0000 mL | Freq: Once | INTRAVENOUS | Status: DC
Start: 2021-05-16 — End: 2021-05-16

## 2021-05-16 MED ORDER — DIPHENHYDRAMINE HCL 50 MG/ML IJ SOLN
12.5000 mg | Freq: Once | INTRAMUSCULAR | Status: AC
  Administered 2021-05-16: 12.5 mg via INTRAVENOUS
  Filled 2021-05-16: qty 1

## 2021-05-16 MED ORDER — LORAZEPAM 2 MG/ML IJ SOLN
1.0000 mg | Freq: Once | INTRAMUSCULAR | Status: AC
  Administered 2021-05-16: 1 mg via INTRAVENOUS
  Filled 2021-05-16: qty 1

## 2021-05-16 MED ORDER — IOHEXOL 350 MG/ML SOLN
75.0000 mL | Freq: Once | INTRAVENOUS | Status: AC | PRN
  Administered 2021-05-16: 75 mL via INTRAVENOUS

## 2021-05-16 MED ORDER — SODIUM CHLORIDE 0.9 % IV BOLUS
1000.0000 mL | Freq: Once | INTRAVENOUS | Status: AC
Start: 2021-05-16 — End: 2021-05-16
  Administered 2021-05-16: 1000 mL via INTRAVENOUS

## 2021-05-16 MED ORDER — PROCHLORPERAZINE EDISYLATE 10 MG/2ML IJ SOLN: 5.0000 mg | Freq: Once | INTRAMUSCULAR | Status: DC

## 2021-05-16 NOTE — ED Provider Notes (Signed)
[facility] EMERGENCY DEPARTMENT Provider Note   CSN: [id number] Arrival date & time: 05/16/21  5732    History Chief Complaint  Patient presents with   Transient Ischemic Attack    Danny Kelley is a 55 y.o. male with past medical history significant for CAD, CHF, hypertension, A. fib, on anticoagulation who presents for evaluation of concerns of stroke like symptoms.  Patient had a generalized headache yesterday evening. Wife states patient seemed more "dazed" than baseline to EMS.  Episode of emesis on scene.  Patient denies any sudden onset thunderclap headache.  Did have to miss a few doses of Eliquis a few weeks ago due to GI procedure.  Wife administered 81 aspirin prior to EMS arrival.  Wife states patient was "talking like a child " this morning, which resolved.  States he had bilateral blurred vision as well as "heaviness" in BL legs over the night with intermittent tingling to right upper and lower extremities. No current numbness, weakness. Hx of TIA. Rates his head pain a 9/10. No photophobia, phonophobia, neck pain, stiffness. Denies additional aggravating or alleviating factors.  Followed by Neuro in [place].   LKN 2200  Not a code stroke. Does not meet LVO criteria.  History obtained from patient, EMS and past medical records. No interpretor was used.  HPI     Past Medical History:  Diagnosis Date   Arthritis    "a little in my right knee" (07/14/2016)   CAD (coronary artery disease)    Chronic congestive heart failure with left ventricular diastolic dysfunction (HCC)    Complication of anesthesia    Pt reports "they have a hard time waking me up"   GERD (gastroesophageal reflux disease)    hx   Headache    "with every heart issue that I have" (07/14/2016)   High cholesterol    History of hiatal hernia 1990s   "fixed it w/scope down my throat"   Hypertension    Myocardial infarction ([place]) 05/2013   stents: left PDA, 1st OM at [facility]    Obesity    Persistent atrial fibrillation ([place])    Pneumonia ~ 1992   QT prolongation 07/15/2016   Seasonal allergies    "I take Allegra prn" (07/14/2016)   Tobacco abuse 12/16/2016    Patient Active Problem List   Diagnosis Date Noted   Colon cancer screening 02/24/2021   Shortness of breath 02/24/2021   Atypical chest pain 02/24/2021   Hiatal hernia 02/24/2021   Gastroesophageal reflux disease 02/24/2021   Chronic anticoagulation 02/24/2021   Secondary hypercoagulable state ([place]) 03/12/2020   Elevated hematocrit 02/01/2020   CAD (coronary artery disease), native coronary artery 09/20/2018   Unstable angina ([place]) 11/27/2017   Angina pectoris ([place]) 11/27/2017   Cerebrovascular accident (CVA) ([place]) 12/16/2016   Hypokalemia 12/16/2016   Mixed hyperlipidemia    OSA (obstructive sleep apnea)    Morbid obesity ([place])    Hemispheric carotid artery syndrome    Persistent atrial fibrillation ([place]) 11/13/2016   Near syncope 07/14/2016   Coronary artery disease due to lipid rich plaque 07/14/2016   Essential hypertension 07/14/2016   Chronic diastolic CHF (congestive heart failure) ([place]) 07/14/2016   Paroxysmal atrial fibrillation ([place]) 06/09/2016    Past Surgical History:  Procedure Laterality Date   APPENDECTOMY  1984   ATRIAL FIBRILLATION ABLATION N/A 03/01/2020   Procedure: ATRIAL FIBRILLATION ABLATION;  Surgeon: [name], MD;  Location: [facility] [place];  Service: Cardiovascular;  Laterality: N/A;  CARDIOVERSION N/A 05/27/2016   Procedure: CARDIOVERSION;  Surgeon: [name], MD;  Location: [facility];  Service: Cardiovascular;  Laterality: N/A;   CARDIOVERSION N/A 06/11/2016   Procedure: CARDIOVERSION;  Surgeon: [name], MD;  Location: [facility];  Service: Cardiovascular;  Laterality: N/A;   CARDIOVERSION N/A 03/21/2020   Procedure: CARDIOVERSION;  Surgeon: Geralynn Rile, MD;  Location: [facility];  Service: Cardiovascular;  Laterality: N/A;   CORONARY  ANGIOPLASTY WITH STENT PLACEMENT  05/30/2013   "2 stents put in at [facility]" & /stent card   CORONARY STENT INTERVENTION N/A 11/27/2017   Procedure: CORONARY STENT INTERVENTION;  Surgeon: [name], MD;  Location: [place];  Service: Cardiovascular;  Laterality: N/A;   CORONARY STENT INTERVENTION N/A 03/21/2019   Procedure: CORONARY STENT INTERVENTION;  Surgeon: [name], MD;  Location: [place] [facility];  Service: Cardiovascular;  Laterality: N/A;   ELECTROPHYSIOLOGIC STUDY N/A 11/13/2016   Procedure: Atrial Fibrillation Ablation;  Surgeon: [name], MD;  Location: [place] [facility];  Service: Cardiovascular;  Laterality: N/A;   ESOPHAGOGASTRODUODENOSCOPY (EGD) WITH ESOPHAGEAL DILATION  1990s X 2   INTRAVASCULAR PRESSURE WIRE/FFR STUDY N/A 09/21/2018   Procedure: INTRAVASCULAR PRESSURE WIRE/FFR STUDY;  Surgeon: [name], MD;  Location: [facility];  Service: Cardiovascular;  Laterality: N/A;   INTRAVASCULAR PRESSURE WIRE/FFR STUDY N/A 03/21/2019   Procedure: INTRAVASCULAR PRESSURE WIRE/FFR STUDY;  Surgeon: [name], MD;  Location: [place] [facility];  Service: Cardiovascular;  Laterality: N/A;   INTRAVASCULAR PRESSURE WIRE/FFR STUDY N/A 01/24/2020   Procedure: INTRAVASCULAR PRESSURE WIRE/FFR STUDY;  Surgeon: [name], MD;  Location: [place];  Service: Cardiovascular;  Laterality: N/A;   KNEE ARTHROSCOPY Right 1986; ~ 1995 X [place]  2015   LEFT HEART CATH AND CORONARY ANGIOGRAPHY N/A 11/27/2017   Procedure: LEFT HEART CATH AND CORONARY ANGIOGRAPHY;  Surgeon: [name], MD;  Location: [facility] [facility];  Service: Cardiovascular;  Laterality: N/A;   LEFT HEART CATH AND CORONARY ANGIOGRAPHY N/A 09/21/2018   Procedure: LEFT HEART CATH AND CORONARY ANGIOGRAPHY;  Surgeon: [name], MD;  Location: [place] [facility];  Service: Cardiovascular;  Laterality: N/A;   LEFT HEART CATH AND CORONARY ANGIOGRAPHY N/A 03/21/2019   Procedure: LEFT  HEART CATH AND CORONARY ANGIOGRAPHY;  Surgeon: [name], MD;  Location: [facility];  Service: Cardiovascular;  Laterality: N/A;   REPAIR OF ESOPHAGUS  1998   perforation of the distal esophagus from bad heartburn   RIGHT/LEFT HEART CATH AND CORONARY ANGIOGRAPHY N/A 01/24/2020   Procedure: RIGHT/LEFT HEART CATH AND CORONARY ANGIOGRAPHY;  Surgeon: [name], MD;  Location: [facility];  Service: Cardiovascular;  Laterality: N/A;   TEE WITHOUT CARDIOVERSION N/A 11/13/2016   Procedure: TRANSESOPHAGEAL ECHOCARDIOGRAM (TEE);  Surgeon: Thayer Headings, MD;  Location: [facility];  Service: Cardiovascular;  Laterality: N/A;       Family History  Problem Relation Age of Onset   Heart disease Mother 70       CABG age 19   Breast cancer Mother    Tongue cancer Mother    Diabetes Mother    Heart attack Father 16       Father had around 7 heart attacks per pt   Diabetes Father    Lung cancer Father    Congestive Heart Failure Father    Lung cancer Paternal Uncle    Colon cancer Neg Hx    Esophageal cancer Neg Hx  Inflammatory bowel disease Neg Hx    Liver disease Neg Hx    Pancreatic cancer Neg Hx    Rectal cancer Neg Hx    Stomach cancer Neg Hx     Social History   Tobacco Use   Smoking status: Former    Years: 25.00    Types: Cigarettes, E-cigarettes    Start date: 1989    Quit date: 07/06/2020    Years since quitting: 0.8   Smokeless tobacco: Never   Tobacco comments:    Stopped smoking Sept 3, 2021  Vaping Use   Vaping Use: Never used  Substance Use Topics   Alcohol use: No   Drug use: No    Home Medications Prior to Admission medications   Medication Sig Start Date End Date Taking? Authorizing Provider  amLODipine (NORVASC) 10 MG tablet Take 1 tablet (10 mg total) by mouth daily. 03/05/21  Yes Cantwell, Celeste C, PA-C  apixaban (ELIQUIS) 5 MG TABS tablet Take 1 tablet (5 mg total) by mouth 2 (two) times daily. 03/18/21  Yes Cantwell, Celeste C, PA-C   bisacodyl (DULCOLAX) 5 MG EC tablet Take 5 mg by mouth daily as needed.   Yes [provider]  docusate sodium (COLACE) 100 MG capsule Take 100 mg by mouth 2 (two) times daily as needed.   Yes [provider]  Evolocumab (REPATHA) 140 MG/ML SOSY Inject 1 mL into the skin every 14 (fourteen) days. 12/26/20  Yes Cantwell, Celeste C, PA-C  ezetimibe (ZETIA) 10 MG tablet Take 1 tablet (10 mg total) by mouth daily. 03/18/21  Yes Cantwell, Celeste C, PA-C  fexofenadine (ALLEGRA) 180 MG tablet Take 180 mg by mouth daily as needed (seasonal allergies).   Yes [provider]  hydrALAZINE (APRESOLINE) 50 MG tablet Take 1 tablet (50 mg total) by mouth 3 (three) times daily. 03/18/21 06/16/21 Yes Cantwell, Celeste C, PA-C  metoprolol tartrate (LOPRESSOR) 100 MG tablet Take 1 tablet by mouth twice daily Patient taking differently: Take 100 mg by mouth 2 (two) times daily. 03/20/21  Yes Cantwell, Celeste C, PA-C  nitroGLYCERIN (NITROSTAT) 0.4 MG SL tablet Place 1 tablet (0.4 mg total) under the tongue every 5 (five) minutes as needed for chest pain. 04/12/21  Yes [name], MD  pantoprazole (PROTONIX) 20 MG tablet Take 1 tablet (20 mg total) by mouth 2 (two) times daily. 04/12/21  Yes [name], MD  sacubitril-valsartan (ENTRESTO) 97-103 MG Take 1 tablet by mouth 2 (two) times daily. 04/12/21  Yes [name], MD  acetaminophen (TYLENOL) 500 MG tablet Take 500 mg by mouth 2 (two) times daily as needed for moderate pain.    [provider]    Allergies    Codeine, Crestor [rosuvastatin], Albuterol, Dilaudid [hydromorphone], Isosorbide, Lexapro [escitalopram], Lipitor [atorvastatin calcium], Erythromycin, and Oxycodone  Review of Systems   Review of Systems  Constitutional: Negative.   HENT: Negative.    Respiratory: Negative.    Cardiovascular: Negative.   Gastrointestinal: Negative.   Genitourinary: Negative.   Musculoskeletal: Negative.   Skin: Negative.   Neurological:   Positive for dizziness, speech difficulty, light-headedness, numbness and headaches.  All other systems reviewed and are negative.  Physical Exam Updated Vital Signs BP 108/71   Pulse (!) 54   Temp 98 F (36.7 C) (Oral)   Resp 16   Ht 5\' 9"  (1.753 m)   Wt 111.1 kg   SpO2 97%   BMI 36.18 kg/m   Physical Exam Physical Exam  Constitutional: Pt is  oriented to person, place, and time. Pt appears well-developed and well-nourished. No distress.  HENT:  Head: Normocephalic and atraumatic.  Mouth/Throat: Oropharynx is clear and moist.  Eyes: Conjunctivae and EOM are normal. Pupils are equal, round, and reactive to light. No scleral icterus.  No horizontal, vertical or rotational nystagmus  Neck: Normal range of motion. Neck supple.  Full active and passive ROM without pain No nuchal rigidity or meningeal signs  Cardiovascular: Normal rate, regular rhythm and intact distal pulses.   Pulmonary/Chest: Effort normal and breath sounds normal. No respiratory distress. Pt has no wheezes. No rales.  Abdominal: Soft. Bowel sounds are normal. There is no tenderness. There is no rebound and no guarding.  Musculoskeletal: Normal range of motion.  Lymphadenopathy:    No cervical adenopathy.  Neurological: Pt. is alert and oriented to person, place, and time. He has normal reflexes. No cranial nerve deficit.  Exhibits normal muscle tone. Coordination normal.  Mental Status:  Alert, oriented, thought content appropriate. Slow to answer questions. Able to follow 2 step commands without difficulty.  Cranial Nerves:  II:  Peripheral visual fields grossly normal, pupils equal, round, reactive to light III,IV, VI: ptosis not present, extra-ocular motions intact bilaterally  V,VII: smile symmetric, facial light touch sensation equal VIII: hearing grossly normal bilaterally  IX,X: midline uvula rise  XI: bilateral shoulder shrug equal and strong XII: midline tongue extension  Motor:  5/5 in upper and  lower extremities bilaterally including strong and equal grip strength and dorsiflexion/plantar flexion Cerebellar: normal finger-to-nose with bilateral upper extremities CV: distal pulses palpable throughout   Skin: Skin is warm and dry. No rash noted. Pt is not diaphoretic.  Psychiatric: Pt has a normal mood and affect. Behavior is normal. Judgment and thought content normal.  Nursing note and vitals reviewed.  ED Results / Procedures / Treatments   Labs (all labs ordered are listed, but only abnormal results are displayed) Labs Reviewed  COMPREHENSIVE METABOLIC PANEL - Abnormal; Notable for the following components:      Result Value   Potassium 3.3 (*)    Glucose, Bld 164 (*)    Calcium 8.5 (*)    Albumin 2.9 (*)    All other components within normal limits  I-STAT CHEM 8, ED - Abnormal; Notable for the following components:   Potassium 3.4 (*)    Glucose, Bld 158 (*)    Calcium, Ion 1.13 (*)    All other components within normal limits  CBG MONITORING, ED - Abnormal; Notable for the following components:   Glucose-Capillary 158 (*)    All other components within normal limits  PROTIME-INR  APTT  CBC  DIFFERENTIAL    EKG EKG Interpretation  Date/Time:  Thursday May 16 2021 09:59:55 EDT Ventricular Rate:  56 PR Interval:  158 QRS Duration: 115 QT Interval:  512 QTC Calculation: 495 R Axis:   37 Text Interpretation: Sinus rhythm Nonspecific intraventricular conduction delay Borderline repolarization abnormality NSR Confirmed by Lavenia Atlas [phone]) on 05/16/2021 1:30:23 PM  Radiology CT Angio Head W or Wo Contrast  Result Date: 05/16/2021 CLINICAL DATA:  TIA EXAM: CT ANGIOGRAPHY HEAD AND NECK TECHNIQUE: Multidetector CT imaging of the head and neck was performed using the standard protocol during bolus administration of intravenous contrast. Multiplanar CT image reconstructions and MIPs were obtained to evaluate the vascular anatomy. Carotid stenosis measurements  (when applicable) are obtained utilizing NASCET criteria, using the distal internal carotid diameter as the denominator. CONTRAST:  33mL OMNIPAQUE IOHEXOL 350 MG/ML SOLN  COMPARISON:  2018 FINDINGS: CTA NECK Aortic arch: Great vessel origins are patent. There is common origin of the left vertebral and subclavian arteries from the aortic arch. Right carotid system: Patent.  No stenosis. Left carotid system: Patent.  No stenosis. Vertebral arteries: Patent.  Right vertebral artery is dominant. Skeleton: Mild cervical spine degenerative changes. Other neck: Unremarkable. Upper chest: No apical lung mass. Review of the MIP images confirms the above findings CTA HEAD Anterior circulation: Intracranial internal carotid arteries are patent with trace calcified plaque on the left. Anterior and middle cerebral arteries are patent. Mild left M2 MCA stenoses. Posterior circulation: Intracranial vertebral arteries are patent. Basilar artery is patent. Major cerebellar artery origins are patent. Mild bilateral P2 PCA stenoses. Venous sinuses: Patent as allowed by contrast bolus timing. Review of the MIP images confirms the above findings IMPRESSION: No occlusion or stenosis in the neck. Mild intracranial atherosclerosis. Electronically Signed   By: Macy Mis M.D.   On: 05/16/2021 15:58   CT HEAD WO CONTRAST  Result Date: 05/16/2021 CLINICAL DATA:  TIA.  Altered mental status.  Headache. EXAM: CT HEAD WITHOUT CONTRAST TECHNIQUE: Contiguous axial images were obtained from the base of the skull through the vertex without intravenous contrast. COMPARISON:  01/06/2017 FINDINGS: Brain: No acute intracranial abnormality. Specifically, no hemorrhage, hydrocephalus, mass lesion, acute infarction, or significant intracranial injury. Vascular: No hyperdense vessel or unexpected calcification. Skull: No acute calvarial abnormality. Sinuses/Orbits: Visualized paranasal sinuses and mastoids clear. Orbital soft tissues unremarkable.  Other: None IMPRESSION: No acute intracranial abnormality. Electronically Signed   By: Rolm Baptise M.D.   On: 05/16/2021 10:45   CT Angio Neck W and/or Wo Contrast  Result Date: 05/16/2021 CLINICAL DATA:  TIA EXAM: CT ANGIOGRAPHY HEAD AND NECK TECHNIQUE: Multidetector CT imaging of the head and neck was performed using the standard protocol during bolus administration of intravenous contrast. Multiplanar CT image reconstructions and MIPs were obtained to evaluate the vascular anatomy. Carotid stenosis measurements (when applicable) are obtained utilizing NASCET criteria, using the distal internal carotid diameter as the denominator. CONTRAST:  5mL OMNIPAQUE IOHEXOL 350 MG/ML SOLN COMPARISON:  2018 FINDINGS: CTA NECK Aortic arch: Great vessel origins are patent. There is common origin of the left vertebral and subclavian arteries from the aortic arch. Right carotid system: Patent.  No stenosis. Left carotid system: Patent.  No stenosis. Vertebral arteries: Patent.  Right vertebral artery is dominant. Skeleton: Mild cervical spine degenerative changes. Other neck: Unremarkable. Upper chest: No apical lung mass. Review of the MIP images confirms the above findings CTA HEAD Anterior circulation: Intracranial internal carotid arteries are patent with trace calcified plaque on the left. Anterior and middle cerebral arteries are patent. Mild left M2 MCA stenoses. Posterior circulation: Intracranial vertebral arteries are patent. Basilar artery is patent. Major cerebellar artery origins are patent. Mild bilateral P2 PCA stenoses. Venous sinuses: Patent as allowed by contrast bolus timing. Review of the MIP images confirms the above findings IMPRESSION: No occlusion or stenosis in the neck. Mild intracranial atherosclerosis. Electronically Signed   By: Macy Mis M.D.   On: 05/16/2021 15:58   MR BRAIN WO CONTRAST  Result Date: 05/16/2021 CLINICAL DATA:  TIA EXAM: MRI HEAD WITHOUT CONTRAST TECHNIQUE:  Multiplanar, multiecho pulse sequences of the brain and surrounding structures were obtained without intravenous contrast. COMPARISON:  2018 FINDINGS: Motion artifact is present. Brain: There is no acute infarction or intracranial hemorrhage. There is no intracranial mass, mass effect, or edema. There is no hydrocephalus or extra-axial fluid  collection. Ventricles and sulci are within normal limits in size and configuration. Patchy foci of T2 hyperintensity in the supratentorial white matter are nonspecific but may reflect mild chronic microvascular ischemic changes. Vascular: Major vessel flow voids at the skull base are preserved. Skull and upper cervical spine: Normal marrow signal is preserved. Sinuses/Orbits: Paranasal sinuses are aerated. Orbits are unremarkable. Other: Sella is unremarkable. Minor patchy right mastoid fluid opacification. IMPRESSION: No evidence of recent infarction, hemorrhage, or mass. Mild chronic microvascular ischemic changes. Electronically Signed   By: Macy Mis M.D.   On: 05/16/2021 12:58    Procedures Procedures   Medications Ordered in ED Medications  sodium chloride flush (NS) 0.9 % injection 3 mL (has no administration in time range)  sodium chloride 0.9 % bolus 1,000 mL (0 mLs Intravenous Stopped 05/16/21 1204)  potassium chloride 10 mEq in 100 mL IVPB (0 mEq Intravenous Stopped 05/16/21 1604)  magnesium sulfate IVPB 2 g 50 mL (0 g Intravenous Stopped 05/16/21 1432)  diphenhydrAMINE (BENADRYL) injection 12.5 mg (12.5 mg Intravenous Given 05/16/21 1202)  LORazepam (ATIVAN) injection 1 mg (1 mg Intravenous Given 05/16/21 1201)  sodium chloride 0.9 % bolus 1,000 mL (0 mLs Intravenous Stopped 05/16/21 1432)  iohexol (OMNIPAQUE) 350 MG/ML injection 75 mL (75 mLs Intravenous Contrast Given 05/16/21 1542)    ED Course  I have reviewed the triage vital signs and the nursing notes.  Pertinent labs & imaging results that were available during my care of the patient were  reviewed by me and considered in my medical decision making (see chart for details).  Here for evaluation of headache as well as neurologic deficits.  Patient is not a code stroke.  Does not meet LVO criteria.  He is slow to answer questions however has no additional deficits.  According to family patient was confused, complaining of numbness and had difficulty speaking "talking like a baby."  Question complicated migraine versus TIA versus CVA  Plan on labs, imaging and reassess:  CBC without leukocytosis CMP hypokalemia at 3.3, glucose 164 no additional aggravating or alleviating factors. INR 1.1 CT head without acute finding EKG without ischemia   Initial hypotensive however normotensive on recheck with systolic in 295'J  CONSULT with Neurology. Patient is currently medically optimized. [place] n MR brain, CTA Head/neck. If wo acute findings can dc home and FU with his Neuro outpatient. Will treat with migraine meds here in ED.  MR brain without acute findings CTA head/ neck without acute findings  Patient reassessed. HA improved here in ED. ambulate without difficulty, nonfocal neuro exam without deficits.  Hypotension resolved.  Question complicated migraine as cause of his symptoms.  He has no infectious symptoms on exam. Low suspicion for sepsis, intrathoracic, intra-abdominal, vascular etiology as cause of his symptoms>> reassuring work-up.  Per neurology patient is already optimized as far as his medications are concerned.  We will have him follow-up closely with his neurologist in [facility].  Patient and wife agreeable.  The patient has been appropriately medically screened and/or stabilized in the ED. I have low suspicion for any other emergent medical condition which would require further screening, evaluation or treatment in the ED or require inpatient management.  Patient is hemodynamically stable and in no acute distress.  Patient able to ambulate in department prior to ED.   Evaluation does not show acute pathology that would require ongoing or additional emergent interventions while in the emergency department or further inpatient treatment.  I have discussed the diagnosis with the  patient and answered all questions.  Pain is been managed while in the emergency department and patient has no further complaints prior to discharge.  Patient is comfortable with plan discussed in room and is stable for discharge at this time.  I have discussed strict return precautions for returning to the emergency department.  Patient was encouraged to follow-up with PCP/specialist refer to at discharge.     MDM Rules/Calculators/A&P                           Final Clinical Impression(s) / ED Diagnoses Final diagnoses:  Acute nonintractable headache, unspecified headache type  Numbness    Rx / DC Orders ED Discharge Orders     None        Zuri Lascala A, PA-C 05/16/21 1836    Horton, Alvin Critchley, DO 05/17/21 0720

## 2021-05-16 NOTE — Discharge Instructions (Signed)
Follow up with Neurology  Return for new or worsening symptoms

## 2021-05-16 NOTE — ED Triage Notes (Signed)
Pt arrives via [facility] for concerns of stroke like symptoms. EMS reports pt more "dazed" than baseline. Pt slow to answer questions during triage. Pt reported HA started last night at 2200. EMS reports pt had bout of emesis on scene. Wife administered 81 mg ASA prior to arrival.   EMS last VS - SBP 112, HR 60, RR 16, 95% on RA, CBG 122.

## 2021-05-16 NOTE — ED Notes (Signed)
Patient transported to MRI 

## 2021-05-20 ENCOUNTER — Other Ambulatory Visit: Payer: Self-pay

## 2021-05-20 DIAGNOSIS — E7800 Pure hypercholesterolemia, unspecified: Secondary | ICD-10-CM

## 2021-05-20 DIAGNOSIS — I25118 Atherosclerotic heart disease of native coronary artery with other forms of angina pectoris: Secondary | ICD-10-CM

## 2021-05-20 MED ORDER — REPATHA 140 MG/ML ~~LOC~~ SOSY: 1.0000 mL | PREFILLED_SYRINGE | SUBCUTANEOUS | 3 refills | Status: DC

## 2021-05-28 NOTE — Telephone Encounter (Signed)
Done

## 2021-05-31 DIAGNOSIS — G43009 Migraine without aura, not intractable, without status migrainosus: Secondary | ICD-10-CM | POA: Diagnosis not present

## 2021-05-31 DIAGNOSIS — Z0000 Encounter for general adult medical examination without abnormal findings: Secondary | ICD-10-CM | POA: Diagnosis not present

## 2021-05-31 DIAGNOSIS — R471 Dysarthria and anarthria: Secondary | ICD-10-CM | POA: Diagnosis not present

## 2021-05-31 DIAGNOSIS — I10 Essential (primary) hypertension: Secondary | ICD-10-CM | POA: Diagnosis not present

## 2021-05-31 DIAGNOSIS — I2510 Atherosclerotic heart disease of native coronary artery without angina pectoris: Secondary | ICD-10-CM | POA: Diagnosis not present

## 2021-06-05 ENCOUNTER — Other Ambulatory Visit: Payer: Self-pay

## 2021-06-05 ENCOUNTER — Encounter: Payer: Self-pay | Admitting: Student

## 2021-06-05 ENCOUNTER — Ambulatory Visit: Payer: Medicare Other | Admitting: Student

## 2021-06-05 VITALS — BP 123/80 | HR 76 | Temp 98.2°F | Resp 16 | Ht 69.0 in | Wt 250.0 lb

## 2021-06-05 DIAGNOSIS — E7800 Pure hypercholesterolemia, unspecified: Secondary | ICD-10-CM

## 2021-06-05 DIAGNOSIS — I10 Essential (primary) hypertension: Secondary | ICD-10-CM | POA: Diagnosis not present

## 2021-06-05 DIAGNOSIS — I25118 Atherosclerotic heart disease of native coronary artery with other forms of angina pectoris: Secondary | ICD-10-CM

## 2021-06-05 DIAGNOSIS — I5042 Chronic combined systolic (congestive) and diastolic (congestive) heart failure: Secondary | ICD-10-CM | POA: Diagnosis not present

## 2021-06-05 MED ORDER — HYDRALAZINE HCL 50 MG PO TABS
50.0000 mg | ORAL_TABLET | Freq: Three times a day (TID) | ORAL | 2 refills | Status: DC
Start: 2021-06-05 — End: 2021-09-13

## 2021-06-05 NOTE — Progress Notes (Signed)
Primary Physician/Referring:  Angelina Sheriff, MD  Patient ID: Danny Kelley, male    DOB: [birth year], 55 y.o.   MRN: [id number]  Chief Complaint  Patient presents with   Hypertension   Shortness of Breath   precordial pain   Follow-up    3 month   HPI:    Danny Kelley  is a 55 y.o. Caucasian male patient with history of cardiomyopathy with chronic systolic heart failure, hypertension, hyperlipidemia (statin intolerant), obstructive sleep apnea compliant with CPAP, tobacco use.  Patient has history of TIA in Feb 2018 while on anticoagulants, paroxysmal atrial fibrillation status post ablation 11/13/2016 and 03/01/2020 (Multaq and sotalol ineffective).  History of unstable angina pectoris on 11/26/2017, angioplasty to superdominant left circumflex coronary artery with 4.5 mm resolute DES in the midsegment, patent stent placed in Distal Cx and OM 1 in 2014, 03/22/2019, stenting to high-grade stenosis in the distal dominant Circumflex PDA branch. Cardiac catheterization on 01/24/2020 revealed no change in coronary anatomy, suspect his cardiomyopathy may have been related to underlying A. Fib.   Of note patient has previously been evaluated for syncope, which was felt to be psychogenic in view of his underlying history of seizure disorder. Patient is enrolled in remote patient monitoring for hypertension.  Patient has been unable to tolerate statin therapy in the past due to severe myalgias including atorvastatin, rosuvastatin, and simvastatin.  Patient presents for 58-monthfollow-up.  Patient has multiple complaints today including worsening fatigue as well as episodes of sharp chest pain both with rest and with exertion which are unchanged compared to previous visit.  He also reports episodes of dizziness associated with low home blood pressure readings.  Denies orthopnea, PND, leg swelling.  He also continues to have episodes of palpitations.  At last visit had recommended patient undergo  pulmonary and GI work-up to evaluate for underlying noncardiac causes of chest discomfort.   Patient has undergone extensive pulmonary and GI work-up both unyielding for explanation of patient's symptoms.   Past Medical History:  Diagnosis Date   Arthritis    "a little in my right knee" (07/14/2016)   CAD (coronary artery disease)    Chronic congestive heart failure with left ventricular diastolic dysfunction (HCC)    Complication of anesthesia    Pt reports "they have a hard time waking me up"   GERD (gastroesophageal reflux disease)    hx   Headache    "with every heart issue that I have" (07/14/2016)   High cholesterol    History of hiatal hernia 1990s   "fixed it w/scope down my throat"   Hypertension    Myocardial infarction ([place] 05[place]   stents: left PDA, [facility]  Obesity    Persistent atrial fibrillation ([place]    Pneumonia ~ 1992   QT prolongation 07/15/2016   Seasonal allergies    "I take Allegra prn" (07/14/2016)   Tobacco abuse 12/16/2016   Past Surgical History:  Procedure Laterality Date   APPENDECTOMY  1984   ATRIAL FIBRILLATION ABLATION N/A 03/01/2020   Procedure: ATRIAL FIBRILLATION ABLATION;  Surgeon: AThompson Grayer MD;  [facility];  Service: Cardiovascular;  Laterality: N/A;   CARDIOVERSION N/A 05/27/2016   Procedure: CARDIOVERSION;  Surgeon: JAdrian Prows MD;  [facility];  Service: Cardiovascular;  Laterality: N/A;   CARDIOVERSION N/A 06/11/2016   Procedure: CARDIOVERSION;  Surgeon: JAdrian Prows MD;  [place] [facility]  Service: Cardiovascular;  Laterality: N/A;  CARDIOVERSION N/A 03/21/2020   Procedure: CARDIOVERSION;  Surgeon: Geralynn Rile, MD;  Location: [place];  Service: Cardiovascular;  Laterality: N/A;   CORONARY ANGIOPLASTY WITH STENT PLACEMENT  05/30/2013   "2 stents put in at [facility]" & /stent card   CORONARY STENT INTERVENTION N/A 11/27/2017   Procedure: CORONARY STENT INTERVENTION;   Surgeon: [name], MD;  Location: [place] [facility];  Service: Cardiovascular;  Laterality: N/A;   CORONARY STENT INTERVENTION N/A 03/21/2019   Procedure: CORONARY STENT INTERVENTION;  Surgeon: [name], MD;  Location: [place] [facility];  Service: Cardiovascular;  Laterality: N/A;   ELECTROPHYSIOLOGIC STUDY N/A 11/13/2016   Procedure: Atrial Fibrillation Ablation;  Surgeon: [name], MD;  Location: [place] [facility];  Service: Cardiovascular;  Laterality: N/A;   ESOPHAGOGASTRODUODENOSCOPY (EGD) WITH ESOPHAGEAL DILATION  1990s X 2   INTRAVASCULAR PRESSURE WIRE/FFR STUDY N/A 09/21/2018   Procedure: INTRAVASCULAR PRESSURE WIRE/FFR STUDY;  Surgeon: [name], MD;  Location: [place] [facility];  Service: Cardiovascular;  Laterality: N/A;   INTRAVASCULAR PRESSURE WIRE/FFR STUDY N/A 03/21/2019   Procedure: INTRAVASCULAR PRESSURE WIRE/FFR STUDY;  Surgeon: [name], MD;  Location: [place] [facility];  Service: Cardiovascular;  Laterality: N/A;   INTRAVASCULAR PRESSURE WIRE/FFR STUDY N/A 01/24/2020   Procedure: INTRAVASCULAR PRESSURE WIRE/FFR STUDY;  Surgeon: [name], MD;  Location: [place] [facility];  Service: Cardiovascular;  Laterality: N/A;   KNEE ARTHROSCOPY Right 1986; ~ 1995 X [place]  2015   LEFT HEART CATH AND CORONARY ANGIOGRAPHY N/A 11/27/2017   Procedure: LEFT HEART CATH AND CORONARY ANGIOGRAPHY;  Surgeon: [name], MD;  Location: [place] [facility];  Service: Cardiovascular;  Laterality: N/A;   LEFT HEART CATH AND CORONARY ANGIOGRAPHY N/A 09/21/2018   Procedure: LEFT HEART CATH AND CORONARY ANGIOGRAPHY;  Surgeon: [name], MD;  Location: [place] [facility];  Service: Cardiovascular;  Laterality: N/A;   LEFT HEART CATH AND CORONARY ANGIOGRAPHY N/A 03/21/2019   Procedure: LEFT HEART CATH AND CORONARY ANGIOGRAPHY;  Surgeon: [name], MD;  Location: [place] [facility];  Service: Cardiovascular;  Laterality: N/A;   REPAIR OF ESOPHAGUS  1998   perforation  of the distal esophagus from bad heartburn   RIGHT/LEFT HEART CATH AND CORONARY ANGIOGRAPHY N/A 01/24/2020   Procedure: RIGHT/LEFT HEART CATH AND CORONARY ANGIOGRAPHY;  Surgeon: [name], MD;  Location: [place] [facility];  Service: Cardiovascular;  Laterality: N/A;   TEE WITHOUT CARDIOVERSION N/A 11/13/2016   Procedure: TRANSESOPHAGEAL ECHOCARDIOGRAM (TEE);  Surgeon: Thayer Headings, MD;  Location: [facility];  Service: Cardiovascular;  Laterality: N/A;   Family History  Problem Relation Age of Onset   Heart disease Mother 21       CABG age 26   Breast cancer Mother    Tongue cancer Mother    Diabetes Mother    Heart attack Father 81       Father had around 7 heart attacks per pt   Diabetes Father    Lung cancer Father    Congestive Heart Failure Father    Lung cancer Paternal Uncle    Colon cancer Neg Hx    Esophageal cancer Neg Hx    Inflammatory bowel disease Neg Hx    Liver disease Neg Hx    Pancreatic cancer Neg Hx    Rectal cancer Neg Hx    Stomach cancer Neg Hx     Social History   Tobacco Use   Smoking status: Every Day  Packs/day: 0.25    Years: 25.00    Pack years: 6.25    Types: Cigarettes, E-cigarettes    Start date: 1989   Smokeless tobacco: Never   Tobacco comments:    Stopped smoking Sept 3, 2021, but has restarted about 3 weeks ago.  Substance Use Topics   Alcohol use: No  Marital status: Married   ROS  Review of Systems  Constitutional: Positive for malaise/fatigue (worsening) and weight gain. Negative for decreased appetite and weight loss.  Cardiovascular:  Positive for chest pain (sharp) and dyspnea on exertion. Negative for claudication, leg swelling, near-syncope, orthopnea, palpitations, paroxysmal nocturnal dyspnea and syncope.  Respiratory:  Negative for shortness of breath.   Hematologic/Lymphatic: Does not bruise/bleed easily.  Gastrointestinal:  Negative for abdominal pain and melena.  Neurological:  Positive for dizziness. Negative  for headaches and weakness.  Psychiatric/Behavioral:  Negative for suicidal ideas.   All other systems reviewed and are negative. Objective  Blood pressure 123/80, pulse 76, temperature 98.2 F (36.8 C), temperature source Temporal, resp. rate 16, height '5\' 9"'$  (1.753 m), weight 250 lb (113.4 kg), SpO2 96 %.  Vitals with BMI 06/05/2021 05/16/2021 05/16/2021  Height '5\' 9"'$  - -  Weight 250 lbs - -  BMI [id number] - -  Systolic [id number]  Diastolic 80 71 90  Pulse 76 54 51    Orthostatic VS for the past 72 hrs (Last 3 readings):  Orthostatic BP Patient Position BP Location Cuff Size Orthostatic Pulse  06/05/21 1343 120/83 Standing Left Arm Normal 72  06/05/21 1342 122/80 Sitting Left Arm Normal 65  06/05/21 1341 132/84 Supine Left Arm Normal 54    Physical Exam Vitals reviewed.  Constitutional:      Comments: Moderately obese  Cardiovascular:     Rate and Rhythm: Normal rate and regular rhythm. FrequentExtrasystoles are present.    Pulses: Intact distal pulses.     Heart sounds: Normal heart sounds, S1 normal and S2 normal. No murmur heard.   No gallop.     Comments: No leg edema, no JVD. Pulmonary:     Effort: Pulmonary effort is normal. No accessory muscle usage or respiratory distress.     Breath sounds: Normal breath sounds. No wheezing, rhonchi or rales.  Musculoskeletal:     Right lower leg: No edema.     Left lower leg: No edema.  Neurological:     Mental Status: He is alert.   Laboratory examination:   Recent Labs    12/10/20 1323 01/09/21 1528 05/16/21 1000 05/16/21 1020  NA 135 137 135 137  K 4.2 3.6 3.3* 3.4*  CL 98 101 103 101  CO2 '25 28 25  '$ --   GLUCOSE 155* 128* 164* 158*  BUN '16 13 10 11  '$ CREATININE 0.91 1.07 1.03 1.00  CALCIUM 8.7 9.1 8.5*  --   GFRNONAA 97  --  >60  --   GFRAA 112  --   --   --    CrCl cannot be calculated (Patient's most recent lab result is older than the maximum 21 days allowed.).  CMP Latest Ref Rng & Units 05/16/2021 05/16/2021  01/09/2021  Glucose 70 - 99 mg/dL 158(H) 164(H) 128(H)  BUN 6 - 20 mg/dL '11 10 13  '$ Creatinine 0.61 - 1.24 mg/dL 1.00 1.03 1.07  Sodium 135 - 145 mmol/L 137 135 137  Potassium 3.5 - 5.1 mmol/L 3.4(L) 3.3(L) 3.6  Chloride 98 - 111 mmol/L 101 103 101  CO2 22 - 32 mmol/L -  25 28  Calcium 8.9 - 10.3 mg/dL - 8.5(L) 9.1  Total Protein 6.5 - 8.1 g/dL - 6.5 7.5  Total Bilirubin 0.3 - 1.2 mg/dL - 0.3 0.4  Alkaline Phos 38 - 126 U/L - 98 111  AST 15 - 41 U/L - 20 14  ALT 0 - 44 U/L - 25 21   CBC Latest Ref Rng & Units 05/16/2021 05/16/2021 01/09/2021  WBC 4.0 - 10.5 K/uL - 9.9 10.3  Hemoglobin 13.0 - 17.0 g/dL 15.6 15.9 16.9  Hematocrit 39.0 - 52.0 % 46.0 47.0 48.2  Platelets 150 - 400 K/uL - 305 279.0   Lipid Panel     Component Value Date/Time   CHOL 239 (H) 01/02/2020 1445   TRIG 143 01/02/2020 1445   HDL 62 01/02/2020 1445   CHOLHDL 5.0 11/27/2017 0634   VLDL 16 11/27/2017 0634   LDLCALC 152 (H) 01/02/2020 1445   HEMOGLOBIN A1C Lab Results  Component Value Date   HGBA1C 6.0 (H) 11/27/2017   MPG 125.5 11/27/2017   TSH Recent Labs    01/09/21 1528  TSH 2.22   BNP No results found for: BNP  ProBNP    Component Value Date/Time   PROBNP 74 12/10/2020 1323   External Labs:  05/31/21  CR 0.8, sodium 139, potassium 3.6 Hgb 16.1, HCT 46, platelet 230  02/28/2021:  TSH 1.769   Allergies   Allergies  Allergen Reactions   Codeine Anaphylaxis   Crestor [Rosuvastatin] Other (See Comments)    Severe leg cramps   Albuterol Swelling   Dilaudid [Hydromorphone] Itching and Swelling   Isosorbide     Muscle pain and cramps   Lexapro [Escitalopram]     Have bad thoughts made anxiety worse    Lipitor [Atorvastatin Calcium] Other (See Comments)    myalgias   Erythromycin Other (See Comments)    Hallucinations    Oxycodone Itching and Rash    Swelling to face, hands, mouth Completley intolerant to any meds with oxycodone in it     Medications Prior to Visit:   Outpatient  Medications Prior to Visit  Medication Sig Dispense Refill   acetaminophen (TYLENOL) 500 MG tablet Take 500 mg by mouth 2 (two) times daily as needed for moderate pain.     amLODipine (NORVASC) 10 MG tablet Take 1 tablet (10 mg total) by mouth daily. 90 tablet 3   apixaban (ELIQUIS) 5 MG TABS tablet Take 1 tablet (5 mg total) by mouth 2 (two) times daily. 180 tablet 1   bisacodyl (DULCOLAX) 5 MG EC tablet Take 5 mg by mouth daily as needed.     docusate sodium (COLACE) 100 MG capsule Take 100 mg by mouth 2 (two) times daily as needed.     Evolocumab (REPATHA) 140 MG/ML SOSY Inject 1 mL into the skin every 14 (fourteen) days. 2 mL 3   ezetimibe (ZETIA) 10 MG tablet Take 1 tablet (10 mg total) by mouth daily. 90 tablet 2   fexofenadine (ALLEGRA) 180 MG tablet Take 180 mg by mouth daily as needed (seasonal allergies).     metoprolol tartrate (LOPRESSOR) 100 MG tablet Take 1 tablet by mouth twice daily (Patient taking differently: Take 100 mg by mouth 2 (two) times daily.) 180 tablet 0   nitroGLYCERIN (NITROSTAT) 0.4 MG SL tablet Place 1 tablet (0.4 mg total) under the tongue every 5 (five) minutes as needed for chest pain. 25 tablet 2   pantoprazole (PROTONIX) 20 MG tablet Take 1 tablet (20 mg total) by  mouth 2 (two) times daily. 180 tablet 2   sacubitril-valsartan (ENTRESTO) 97-103 MG Take 1 tablet by mouth 2 (two) times daily. 60 tablet 3   hydrALAZINE (APRESOLINE) 50 MG tablet Take 1 tablet (50 mg total) by mouth 3 (three) times daily. 90 tablet 2   0.9 %  sodium chloride infusion      No facility-administered medications prior to visit.   Final Medications at End of Visit    Current Meds  Medication Sig   acetaminophen (TYLENOL) 500 MG tablet Take 500 mg by mouth 2 (two) times daily as needed for moderate pain.   amLODipine (NORVASC) 10 MG tablet Take 1 tablet (10 mg total) by mouth daily.   apixaban (ELIQUIS) 5 MG TABS tablet Take 1 tablet (5 mg total) by mouth 2 (two) times daily.    bisacodyl (DULCOLAX) 5 MG EC tablet Take 5 mg by mouth daily as needed.   docusate sodium (COLACE) 100 MG capsule Take 100 mg by mouth 2 (two) times daily as needed.   Evolocumab (REPATHA) 140 MG/ML SOSY Inject 1 mL into the skin every 14 (fourteen) days.   ezetimibe (ZETIA) 10 MG tablet Take 1 tablet (10 mg total) by mouth daily.   fexofenadine (ALLEGRA) 180 MG tablet Take 180 mg by mouth daily as needed (seasonal allergies).   metoprolol tartrate (LOPRESSOR) 100 MG tablet Take 1 tablet by mouth twice daily (Patient taking differently: Take 100 mg by mouth 2 (two) times daily.)   nitroGLYCERIN (NITROSTAT) 0.4 MG SL tablet Place 1 tablet (0.4 mg total) under the tongue every 5 (five) minutes as needed for chest pain.   pantoprazole (PROTONIX) 20 MG tablet Take 1 tablet (20 mg total) by mouth 2 (two) times daily.   sacubitril-valsartan (ENTRESTO) 97-103 MG Take 1 tablet by mouth 2 (two) times daily.   [DISCONTINUED] hydrALAZINE (APRESOLINE) 50 MG tablet Take 1 tablet (50 mg total) by mouth 3 (three) times daily.   Radiology:   No results found.  Cardiac Studies:    PCV ECHOCARDIOGRAM COMPLETE 11/15/2020 Study Quality: Technically difficult study. Normal LV systolic function with visual EF 50-55%. Left ventricle cavity is normal in size. Normal left ventricular wall thickness. Normal global wall motion. Normal diastolic filling pattern, normal LAP. Insignificant pericardial effusion. There is no hemodynamic significance. No significant valvular heart disease. Compared to prior study dated 12/28/2019: LVEF improved from 25-30% to 50-55% otherwise no significant change.  14-day cardiac event monitor 08/21/2020: Sinus rhythm Premature atrial contractions and premature ventricular contractions are noted Rare nonsustained ventricular tachycardia No sustained arrhythmias  Left and right heart catheterization 01/24/2020: Right heart: RA 18/18, mean 17; RV 44/10, EDP 15; PA 48/19, mean 33.  PA  saturation 72%.  PW 25/26, mean 23 mmHg.  Aortic saturation 98%. Cardiac output 4.73, cardiac index 2.09, reduced.  QP/QS 1.00. Impression: Mild decrease in cardiac output and cardiac index.  Mild to moderate pulmonary hypertension related to elevated LVEDP.   Left heart catheterization: LV pressure 139/17, EDP 31 mmHg.  Aortic pressure 144/97, mean 122 mmHg.  No pressure gradient. Right coronary artery is nondominant but a large vessel with a proximal 70% stenosis unchanged from cardiac catheterization on 03/21/2019. Left main is large.  It trifurcates. Circumflex: Dominant vessel.  Previously placed stent in the proximal to mid circumflex, OM1 and also PDA widely patent. Ramus intermediate: Large vessel, mild disease. LAD: Large vessel, diffuse 60 to 70% in some views appears to be almost 80% proximal long segment stenosis.  DFR 0.91,  FFR 0.89,  not hemodynamically significant.  Lesion slightly progressed compared to prior angiography in 2020.    Overall impression: Nonischemic cardiomyopathy, probably related to underlying atrial fibrillation and hypertensive heart disease.  No significant change in coronary anatomy, has moderate underlying disease in the LAD but not hemodynamically significant and previously placed stent in the circumflex widely patent (Circumflex stent placed 11/27/2017, 4.5 x 18 mm resolute widely patent, patent 3.5 x 18 mm Xience DES to dominant distal left circumflex, 2.5 x 12 mm Xience to OM1 placed 05/30/2013.)    Recommendation: Would consider atrial fibrillation ablation to evaluate and see whether his LVEF would improve.  Aggressive medical management of chronic systolic and diastolic heart failure, including weight loss and aggressive blood pressure control would also help.  95 mill contrast utilized.  Sleep study X 2 negative   Event Monitor 30 days 07/28/2018: Paroxysmal episodes of atrial fibrillation with controlled ventricular response was noted along with PVCs.  Some of this symptoms of chest pain, palpitations and dizziness correlated with atrial fibrillation, others showed normal sinus rhythm. No heart block, no other significant arrhythmias.  Direct current cardioversion ( Ist 05/27/16) 06/11/2016: 150x2, 200 x1 to NSR on Sotalol. A. Fib ablation by Dr. [name] on 11/13/2016  EKG   03/05/2021: Sinus rhythm with frequent PVCs including trigeminal pattern at a rate of 75 bpm.  Normal axis.  Incomplete right bundle branch block.  No evidence of ischemia or underlying injury pattern.  12/06/20: Sinus bradycardia at a rate of 55 bpm.  Normal axis.  No evidence of ischemia or injury pattern.   01/04/2020: Atrial fibrillation with rapid ventricular response at the rate of 111 bpm, normal axis, diffuse ST-T wave abnormality, cannot exclude inferior ischemia, anterolateral ischemia.  Normal QT interval.   No significant change from  EKG 12/20/2019: Atrial fibrillation with RVR at rate of 121 bpm  03/31/2019: Atrial fibrillation controlled ventricular response at the rate of 70 bpm, normal axis.   Assessment     ICD-10-CM   1. Coronary artery disease of native artery of native heart with stable angina pectoris ([place])  I25.118     2. Chronic combined systolic and diastolic heart failure (HCC)  I50.42 hydrALAZINE (APRESOLINE) 50 MG tablet    3. Essential hypertension  I10 hydrALAZINE (APRESOLINE) 50 MG tablet    4. Hypercholesteremia  E78.00 Lipid Panel With LDL/HDL Ratio      Meds ordered this encounter  Medications   hydrALAZINE (APRESOLINE) 50 MG tablet    Sig: Take 1 tablet (50 mg total) by mouth 3 (three) times daily. With additional dose as needed for BP >150/90 mmHg.    Dispense:  90 tablet    Refill:  2    Medications Discontinued During This Encounter  Medication Reason   0.9 %  sodium chloride infusion Error   hydrALAZINE (APRESOLINE) 50 MG tablet     This patients CHA2DS2-VASc Score 5 (CHF, HTN, Vasc, TIA) and yearly risk of stroke  7.2%.   Recommendations:   Danny Kelley  is a  55 y.o. Caucasian male patient with history of cardiomyopathy with chronic systolic heart failure, hypertension, hyperlipidemia (statin intolerant), obstructive sleep apnea compliant with CPAP, tobacco use.  Patient has history of TIA in Feb 2018 while on anticoagulants, paroxysmal atrial fibrillation status post ablation 11/13/2016 and 03/01/2020 (Multaq and sotalol ineffective).  History of unstable angina pectoris on 11/26/2017, angioplasty to superdominant left circumflex coronary artery with 4.5 mm resolute DES in the midsegment, patent stent  placed in Distal Cx and OM 1 in 2014, 03/22/2019, stenting to high-grade stenosis in the distal dominant Circumflex PDA branch. Cardiac catheterization on 01/24/2020 revealed no change in coronary anatomy, suspect his cardiomyopathy may have been related to underlying A. Fib.   Of note patient has previously been evaluated for syncope, which was felt to be psychogenic in view of his underlying history of seizure disorder. Patient is enrolled in remote patient monitoring for hypertension.  Patient has been unable to tolerate statin therapy in the past due to severe myalgias including atorvastatin, rosuvastatin, and simvastatin.  Patient presents for 66-monthfollow-up.  He continues to complain of chest pain, dizziness, worsening fatigue, and dyspnea on exertion.  Patient is not orthostatic in the office today.  Review of home blood pressure readings shows evidence of occasional blood pressure spikes with systolic BP greater than 10000000mmHg.  He also continues to have occasional episodes with systolic BP <[id number].  Advised patient to take additional dose of hydralazine as needed for blood pressures >140/90 mmHg.  He verbalized understanding and agreement.  Patient has undergone extensive GI and pulmonary work-up which have been unyielding in regards to identifying cause of patient's ongoing chest pain.  Given his continued  chest pain and dyspnea on exertion despite guideline directed medical therapy, discussed option of proceeding with repeat coronary angiography vs stress testing.  The left heart catheterization procedure was explained to the patient in detail. The indication, alternatives, risks and benefits were reviewed. Complications including but not limited to bleeding, infection, acute kidney injury, blood transfusion, heart rhythm disturbances, contrast (dye) reaction, damage to the arteries or nerves in the legs or hands, cerebrovascular accident, myocardial infarction, need for emergent bypass surgery, blood clots in the legs, possible need for emergent blood transfusion, and rarely death were reviewed and discussed with the patient.   The patient voices understanding and wishes to think about cath vs stress test Will notify our office.   Exam patient continues to have frequent PVCs, recommend patient continue to follow with cardiac.  Follow-up in 6 weeks, sooner if needed.   Patient was seen in collaboration with Dr. GEinar Gipand he is in agreement with the plan.     CAlethia Berthold PA-C 06/06/2021, 12:05 PM Office: [phone]

## 2021-06-06 ENCOUNTER — Telehealth: Payer: Self-pay | Admitting: Student

## 2021-06-06 NOTE — Telephone Encounter (Signed)
Left message for patient to called office back to discuss cath vs. Stress test.

## 2021-06-23 ENCOUNTER — Other Ambulatory Visit: Payer: Self-pay | Admitting: Student

## 2021-07-16 NOTE — Progress Notes (Signed)
Primary Physician/Referring:  Angelina Sheriff, MD  Patient ID: Danny Kelley, male    DOB: [birth year], 55 y.o.   MRN: [id number]  Chief Complaint  Patient presents with   Coronary Artery Disease   Follow-up   Hypertension   HPI:    Danny Kelley  is a 55 y.o. Caucasian male patient with history of cardiomyopathy with chronic systolic heart failure, hypertension, hyperlipidemia (statin intolerant), obstructive sleep apnea compliant with CPAP, tobacco use.  Patient has history of TIA in Feb 2018 while on anticoagulants, paroxysmal atrial fibrillation status post ablation 11/13/2016 and 03/01/2020 (Multaq and sotalol ineffective).  History of unstable angina pectoris on 11/26/2017, angioplasty to superdominant left circumflex coronary artery with 4.5 mm resolute DES in the midsegment, patent stent placed in Distal Cx and OM 1 in 2014, 03/22/2019, stenting to high-grade stenosis in the distal dominant Circumflex PDA branch. Cardiac catheterization on 01/24/2020 revealed no change in coronary anatomy, suspect his cardiomyopathy may have been related to underlying A. Fib.   Of note patient has previously been evaluated for syncope, which was felt to be psychogenic in view of his underlying history of seizure disorder. Patient is enrolled in remote patient monitoring for hypertension.  Patient has been unable to tolerate statin therapy in the past due to severe myalgias including atorvastatin, rosuvastatin, and simvastatin.  Patient had been on Repatha, however he is not discontinuing this due to concerns that it was causing leg pains and cramps.  Patient presents for follow-up, unfortunately continues to experience episodes of chest pain which he describes as sharp and stabbing multiple times throughout the day both with rest and exertion.  He also continues to have episodes of dizziness.  Although he does report fatigue is improving.  Unfortunately patient continues to smoke approximately 5  cigarettes/day.  He is enrolled in remote patient monitoring of blood pressure.  Past Medical History:  Diagnosis Date   Arthritis    "a little in my right knee" (07/14/2016)   CAD (coronary artery disease)    Chronic congestive heart failure with left ventricular diastolic dysfunction (HCC)    Complication of anesthesia    Pt reports "they have a hard time waking me up"   GERD (gastroesophageal reflux disease)    hx   Headache    "with every heart issue that I have" (07/14/2016)   High cholesterol    History of hiatal hernia 1990s   "fixed it w/scope down my throat"   Hypertension    Myocardial infarction ([place]) 05/2013   stents: left PDA, 1st OM at [facility]   Obesity    Persistent atrial fibrillation ([place])    Pneumonia ~ 1992   QT prolongation 07/15/2016   Seasonal allergies    "I take Allegra prn" (07/14/2016)   Tobacco abuse 12/16/2016   Past Surgical History:  Procedure Laterality Date   APPENDECTOMY  1984   ATRIAL FIBRILLATION ABLATION N/A 03/01/2020   Procedure: ATRIAL FIBRILLATION ABLATION;  Surgeon: [name], MD;  Location: [place] [facility];  Service: Cardiovascular;  Laterality: N/A;   CARDIOVERSION N/A 05/27/2016   Procedure: CARDIOVERSION;  Surgeon: [name], MD;  Location: [facility] ENDOSCOPY;  Service: Cardiovascular;  Laterality: N/A;   CARDIOVERSION N/A 06/11/2016   Procedure: CARDIOVERSION;  Surgeon: [name], MD;  Location: [place];  Service: Cardiovascular;  Laterality: N/A;   CARDIOVERSION N/A 03/21/2020   Procedure: CARDIOVERSION;  Surgeon: Geralynn Rile, MD;  Location: [place];  Service: Cardiovascular;  Laterality: N/A;  CORONARY ANGIOPLASTY WITH STENT PLACEMENT  05/30/2013   "2 stents put in at [facility]" & /stent card   CORONARY STENT INTERVENTION N/A 11/27/2017   Procedure: CORONARY STENT INTERVENTION;  Surgeon: [name], MD;  Location: [place] [facility];  Service: Cardiovascular;  Laterality: N/A;   CORONARY STENT  INTERVENTION N/A 03/21/2019   Procedure: CORONARY STENT INTERVENTION;  Surgeon: [name], MD;  Location: [place] [facility];  Service: Cardiovascular;  Laterality: N/A;   ELECTROPHYSIOLOGIC STUDY N/A 11/13/2016   Procedure: Atrial Fibrillation Ablation;  Surgeon: [name], MD;  Location: [place] [facility];  Service: Cardiovascular;  Laterality: N/A;   ESOPHAGOGASTRODUODENOSCOPY (EGD) WITH ESOPHAGEAL DILATION  1990s X 2   INTRAVASCULAR PRESSURE WIRE/FFR STUDY N/A 09/21/2018   Procedure: INTRAVASCULAR PRESSURE WIRE/FFR STUDY;  Surgeon: [name], MD;  Location: [place] [facility];  Service: Cardiovascular;  Laterality: N/A;   INTRAVASCULAR PRESSURE WIRE/FFR STUDY N/A 03/21/2019   Procedure: INTRAVASCULAR PRESSURE WIRE/FFR STUDY;  Surgeon: [name], MD;  Location: [place] [facility];  Service: Cardiovascular;  Laterality: N/A;   INTRAVASCULAR PRESSURE WIRE/FFR STUDY N/A 01/24/2020   Procedure: INTRAVASCULAR PRESSURE WIRE/FFR STUDY;  Surgeon: [name], MD;  Location: [place] [facility];  Service: Cardiovascular;  Laterality: N/A;   KNEE ARTHROSCOPY Right 1986; ~ 1995 X [place]  2015   LEFT HEART CATH AND CORONARY ANGIOGRAPHY N/A 11/27/2017   Procedure: LEFT HEART CATH AND CORONARY ANGIOGRAPHY;  Surgeon: [name], MD;  Location: [place] [facility];  Service: Cardiovascular;  Laterality: N/A;   LEFT HEART CATH AND CORONARY ANGIOGRAPHY N/A 09/21/2018   Procedure: LEFT HEART CATH AND CORONARY ANGIOGRAPHY;  Surgeon: [name], MD;  Location: [place] [facility];  Service: Cardiovascular;  Laterality: N/A;   LEFT HEART CATH AND CORONARY ANGIOGRAPHY N/A 03/21/2019   Procedure: LEFT HEART CATH AND CORONARY ANGIOGRAPHY;  Surgeon: [name], MD;  Location: [place] [facility];  Service: Cardiovascular;  Laterality: N/A;   REPAIR OF ESOPHAGUS  1998   perforation of the distal esophagus from bad heartburn   RIGHT/LEFT HEART CATH AND CORONARY ANGIOGRAPHY N/A 01/24/2020    Procedure: RIGHT/LEFT HEART CATH AND CORONARY ANGIOGRAPHY;  Surgeon: [name], MD;  Location: [place] [facility];  Service: Cardiovascular;  Laterality: N/A;   TEE WITHOUT CARDIOVERSION N/A 11/13/2016   Procedure: TRANSESOPHAGEAL ECHOCARDIOGRAM (TEE);  Surgeon: Thayer Headings, MD;  Location: [facility] ENDOSCOPY;  Service: Cardiovascular;  Laterality: N/A;   Family History  Problem Relation Age of Onset   Heart disease Mother 71       CABG age 23   Breast cancer Mother    Tongue cancer Mother    Diabetes Mother    Heart attack Father 78       Father had around 7 heart attacks per pt   Diabetes Father    Lung cancer Father    Congestive Heart Failure Father    Lung cancer Paternal Uncle    Colon cancer Neg Hx    Esophageal cancer Neg Hx    Inflammatory bowel disease Neg Hx    Liver disease Neg Hx    Pancreatic cancer Neg Hx    Rectal cancer Neg Hx    Stomach cancer Neg Hx     Social History   Tobacco Use   Smoking status: Every Day    Packs/day: 0.25    Years: 25.00    Pack years: 6.25    Types: Cigarettes, E-cigarettes  Start date: 1989   Smokeless tobacco: Never  Substance Use Topics   Alcohol use: No  Marital status: Married   ROS  Review of Systems  Constitutional: Positive for malaise/fatigue (improving). Negative for decreased appetite, weight gain and weight loss.  Cardiovascular:  Positive for chest pain (sharp) and dyspnea on exertion. Negative for claudication, leg swelling, near-syncope, orthopnea, palpitations, paroxysmal nocturnal dyspnea and syncope.  Respiratory:  Negative for shortness of breath.   Hematologic/Lymphatic: Does not bruise/bleed easily.  Gastrointestinal:  Negative for abdominal pain and melena.  Neurological:  Positive for dizziness. Negative for headaches and weakness.  Psychiatric/Behavioral:  Negative for suicidal ideas.   All other systems reviewed and are negative. Objective  Blood pressure (!) 127/98, pulse (!) 52, temperature 98.2 F  (36.8 C), height '5\' 9"'$  (1.753 m), weight 246 lb (111.6 kg), SpO2 96 %.  Vitals with BMI 07/17/2021 06/05/2021 05/16/2021  Height '5\' 9"'$  '5\' 9"'$  -  Weight 246 lbs 250 lbs -  BMI 0000000 [id number] -  Systolic [id number]  Diastolic 98 80 71  Pulse 52 76 54     Physical Exam Vitals reviewed.  Constitutional:      Comments: Moderately obese  Cardiovascular:     Rate and Rhythm: Normal rate and regular rhythm. No extrasystoles are present.    Pulses: Intact distal pulses.     Heart sounds: Normal heart sounds, S1 normal and S2 normal. No murmur heard.   No gallop.     Comments: No leg edema, no JVD. Pulmonary:     Effort: Pulmonary effort is normal. No accessory muscle usage or respiratory distress.     Breath sounds: Normal breath sounds. No wheezing, rhonchi or rales.  Musculoskeletal:     Right lower leg: No edema.     Left lower leg: No edema.  Neurological:     Mental Status: He is alert.   Laboratory examination:   Recent Labs    12/10/20 1323 01/09/21 1528 05/16/21 1000 05/16/21 1020  NA 135 137 135 137  K 4.2 3.6 3.3* 3.4*  CL 98 101 103 101  CO2 '25 28 25  '$ --   GLUCOSE 155* 128* 164* 158*  BUN '16 13 10 11  '$ CREATININE 0.91 1.07 1.03 1.00  CALCIUM 8.7 9.1 8.5*  --   GFRNONAA 97  --  >60  --   GFRAA 112  --   --   --    CrCl cannot be calculated (Patient's most recent lab result is older than the maximum 21 days allowed.).  CMP Latest Ref Rng & Units 05/16/2021 05/16/2021 01/09/2021  Glucose 70 - 99 mg/dL 158(H) 164(H) 128(H)  BUN 6 - 20 mg/dL '11 10 13  '$ Creatinine 0.61 - 1.24 mg/dL 1.00 1.03 1.07  Sodium 135 - 145 mmol/L 137 135 137  Potassium 3.5 - 5.1 mmol/L 3.4(L) 3.3(L) 3.6  Chloride 98 - 111 mmol/L 101 103 101  CO2 22 - 32 mmol/L - 25 28  Calcium 8.9 - 10.3 mg/dL - 8.5(L) 9.1  Total Protein 6.5 - 8.1 g/dL - 6.5 7.5  Total Bilirubin 0.3 - 1.2 mg/dL - 0.3 0.4  Alkaline Phos 38 - 126 U/L - 98 111  AST 15 - 41 U/L - 20 14  ALT 0 - 44 U/L - 25 21   CBC Latest Ref Rng &  Units 05/16/2021 05/16/2021 01/09/2021  WBC 4.0 - 10.5 K/uL - 9.9 10.3  Hemoglobin 13.0 - 17.0 g/dL 15.6 15.9 16.9  Hematocrit 39.0 -  52.0 % 46.0 47.0 48.2  Platelets 150 - 400 K/uL - 305 279.0   Lipid Panel     Component Value Date/Time   CHOL 239 (H) 01/02/2020 1445   TRIG 143 01/02/2020 1445   HDL 62 01/02/2020 1445   CHOLHDL 5.0 11/27/2017 0634   VLDL 16 11/27/2017 0634   LDLCALC 152 (H) 01/02/2020 1445   HEMOGLOBIN A1C Lab Results  Component Value Date   HGBA1C 6.0 (H) 11/27/2017   MPG 125.5 11/27/2017   TSH Recent Labs    01/09/21 1528  TSH 2.22   BNP No results found for: BNP  ProBNP    Component Value Date/Time   PROBNP 74 12/10/2020 1323   External Labs:  05/31/21  CR 0.8, sodium 139, potassium 3.6 Hgb 16.1, HCT 46, platelet 230  02/28/2021:  TSH 1.769   Allergies   Allergies  Allergen Reactions   Codeine Anaphylaxis   Crestor [Rosuvastatin] Other (See Comments)    Severe leg cramps   Albuterol Swelling   Dilaudid [Hydromorphone] Itching and Swelling   Isosorbide     Muscle pain and cramps   Lexapro [Escitalopram]     Have bad thoughts made anxiety worse    Lipitor [Atorvastatin Calcium] Other (See Comments)    myalgias   Erythromycin Other (See Comments)    Hallucinations    Oxycodone Itching and Rash    Swelling to face, hands, mouth Completley intolerant to any meds with oxycodone in it     Medications Prior to Visit:   Outpatient Medications Prior to Visit  Medication Sig Dispense Refill   acetaminophen (TYLENOL) 500 MG tablet Take 500 mg by mouth 2 (two) times daily as needed for moderate pain.     albuterol (VENTOLIN HFA) 108 (90 Base) MCG/ACT inhaler Inhale into the lungs.     amLODipine (NORVASC) 10 MG tablet Take 1 tablet (10 mg total) by mouth daily. 90 tablet 3   bisacodyl (DULCOLAX) 5 MG EC tablet Take 5 mg by mouth daily as needed.     docusate sodium (COLACE) 100 MG capsule Take 100 mg by mouth 2 (two) times daily as needed.      ELIQUIS 5 MG TABS tablet Take 1 tablet by mouth twice daily 180 tablet 0   ezetimibe (ZETIA) 10 MG tablet Take 1 tablet (10 mg total) by mouth daily. 90 tablet 2   fexofenadine (ALLEGRA) 180 MG tablet Take 180 mg by mouth daily as needed (seasonal allergies).     hydrALAZINE (APRESOLINE) 50 MG tablet Take 1 tablet (50 mg total) by mouth 3 (three) times daily. With additional dose as needed for BP >150/90 mmHg. 90 tablet 2   metoprolol tartrate (LOPRESSOR) 100 MG tablet Take 1 tablet by mouth twice daily (Patient taking differently: Take 100 mg by mouth 2 (two) times daily.) 180 tablet 0   nitroGLYCERIN (NITROSTAT) 0.4 MG SL tablet Place 1 tablet (0.4 mg total) under the tongue every 5 (five) minutes as needed for chest pain. 25 tablet 2   pantoprazole (PROTONIX) 20 MG tablet Take 1 tablet (20 mg total) by mouth 2 (two) times daily. 180 tablet 2   sacubitril-valsartan (ENTRESTO) 97-103 MG Take 1 tablet by mouth 2 (two) times daily. 60 tablet 3   Evolocumab (REPATHA) 140 MG/ML SOSY Inject 1 mL into the skin every 14 (fourteen) days. 2 mL 3   No facility-administered medications prior to visit.   Final Medications at End of Visit    Current Meds  Medication Sig  acetaminophen (TYLENOL) 500 MG tablet Take 500 mg by mouth 2 (two) times daily as needed for moderate pain.   albuterol (VENTOLIN HFA) 108 (90 Base) MCG/ACT inhaler Inhale into the lungs.   amLODipine (NORVASC) 10 MG tablet Take 1 tablet (10 mg total) by mouth daily.   bisacodyl (DULCOLAX) 5 MG EC tablet Take 5 mg by mouth daily as needed.   docusate sodium (COLACE) 100 MG capsule Take 100 mg by mouth 2 (two) times daily as needed.   ELIQUIS 5 MG TABS tablet Take 1 tablet by mouth twice daily   ezetimibe (ZETIA) 10 MG tablet Take 1 tablet (10 mg total) by mouth daily.   fexofenadine (ALLEGRA) 180 MG tablet Take 180 mg by mouth daily as needed (seasonal allergies).   hydrALAZINE (APRESOLINE) 50 MG tablet Take 1 tablet (50 mg total) by  mouth 3 (three) times daily. With additional dose as needed for BP >150/90 mmHg.   metoprolol tartrate (LOPRESSOR) 100 MG tablet Take 1 tablet by mouth twice daily (Patient taking differently: Take 100 mg by mouth 2 (two) times daily.)   nitroGLYCERIN (NITROSTAT) 0.4 MG SL tablet Place 1 tablet (0.4 mg total) under the tongue every 5 (five) minutes as needed for chest pain.   pantoprazole (PROTONIX) 20 MG tablet Take 1 tablet (20 mg total) by mouth 2 (two) times daily.   sacubitril-valsartan (ENTRESTO) 97-103 MG Take 1 tablet by mouth 2 (two) times daily.   Radiology:   No results found.  Cardiac Studies:    PCV ECHOCARDIOGRAM COMPLETE 11/15/2020 Study Quality: Technically difficult study. Normal LV systolic function with visual EF 50-55%. Left ventricle cavity is normal in size. Normal left ventricular wall thickness. Normal global wall motion. Normal diastolic filling pattern, normal LAP. Insignificant pericardial effusion. There is no hemodynamic significance. No significant valvular heart disease. Compared to prior study dated 12/28/2019: LVEF improved from 25-30% to 50-55% otherwise no significant change.  14-day cardiac event monitor 08/21/2020: Sinus rhythm Premature atrial contractions and premature ventricular contractions are noted Rare nonsustained ventricular tachycardia No sustained arrhythmias  Left and right heart catheterization 01/24/2020: Right heart: RA 18/18, mean 17; RV 44/10, EDP 15; PA 48/19, mean 33.  PA saturation 72%.  PW 25/26, mean 23 mmHg.  Aortic saturation 98%. Cardiac output 4.73, cardiac index 2.09, reduced.  QP/QS 1.00. Impression: Mild decrease in cardiac output and cardiac index.  Mild to moderate pulmonary hypertension related to elevated LVEDP.   Left heart catheterization: LV pressure 139/17, EDP 31 mmHg.  Aortic pressure 144/97, mean 122 mmHg.  No pressure gradient. Right coronary artery is nondominant but a large vessel with a proximal 70%  stenosis unchanged from cardiac catheterization on 03/21/2019. Left main is large.  It trifurcates. Circumflex: Dominant vessel.  Previously placed stent in the proximal to mid circumflex, OM1 and also PDA widely patent. Ramus intermediate: Large vessel, mild disease. LAD: Large vessel, diffuse 60 to 70% in some views appears to be almost 80% proximal long segment stenosis.  DFR 0.91, FFR 0.89,  not hemodynamically significant.  Lesion slightly progressed compared to prior angiography in 2020.    Overall impression: Nonischemic cardiomyopathy, probably related to underlying atrial fibrillation and hypertensive heart disease.  No significant change in coronary anatomy, has moderate underlying disease in the LAD but not hemodynamically significant and previously placed stent in the circumflex widely patent (Circumflex stent placed 11/27/2017, 4.5 x 18 mm resolute widely patent, patent 3.5 x 18 mm Xience DES to dominant distal left circumflex, 2.5 x 12  mm Xience to OM1 placed 05/30/2013.)    Recommendation: Would consider atrial fibrillation ablation to evaluate and see whether his LVEF would improve.  Aggressive medical management of chronic systolic and diastolic heart failure, including weight loss and aggressive blood pressure control would also help.  95 mill contrast utilized.  Sleep study X 2 negative   Event Monitor 30 days 07/28/2018: Paroxysmal episodes of atrial fibrillation with controlled ventricular response was noted along with PVCs. Some of this symptoms of chest pain, palpitations and dizziness correlated with atrial fibrillation, others showed normal sinus rhythm. No heart block, no other significant arrhythmias.  Direct current cardioversion ( Ist 05/27/16) 06/11/2016: 150x2, 200 x1 to NSR on Sotalol. A. Fib ablation by Dr. [name] on 11/13/2016  EKG   03/05/2021: Sinus rhythm with frequent PVCs including trigeminal pattern at a rate of 75 bpm.  Normal axis.  Incomplete right  bundle branch block.  No evidence of ischemia or underlying injury pattern.  12/06/20: Sinus bradycardia at a rate of 55 bpm.  Normal axis.  No evidence of ischemia or injury pattern.   01/04/2020: Atrial fibrillation with rapid ventricular response at the rate of 111 bpm, normal axis, diffuse ST-T wave abnormality, cannot exclude inferior ischemia, anterolateral ischemia.  Normal QT interval.   No significant change from  EKG 12/20/2019: Atrial fibrillation with RVR at rate of 121 bpm  03/31/2019: Atrial fibrillation controlled ventricular response at the rate of 70 bpm, normal axis.   Assessment     ICD-10-CM   1. Coronary artery disease of native artery of native heart with stable angina pectoris (HCC)  123456 Basic metabolic panel    CBC    2. Chronic combined systolic and diastolic heart failure (HCC)  I50.42     3. Essential hypertension  I10     4. Hypercholesteremia  E78.00       No orders of the defined types were placed in this encounter.   Medications Discontinued During This Encounter  Medication Reason   Evolocumab (REPATHA) 140 MG/ML SOSY Side effect (s)    This patients CHA2DS2-VASc Score 5 (CHF, HTN, Vasc, TIA) and yearly risk of stroke 7.2%.   Recommendations:   Danny Kelley  is a  56 y.o. Caucasian male patient with history of cardiomyopathy with chronic systolic heart failure, hypertension, hyperlipidemia (statin intolerant), obstructive sleep apnea compliant with CPAP, tobacco use.  Patient has history of TIA in Feb 2018 while on anticoagulants, paroxysmal atrial fibrillation status post ablation 11/13/2016 and 03/01/2020 (Multaq and sotalol ineffective).  History of unstable angina pectoris on 11/26/2017, angioplasty to superdominant left circumflex coronary artery with 4.5 mm resolute DES in the midsegment, patent stent placed in Distal Cx and OM 1 in 2014, 03/22/2019, stenting to high-grade stenosis in the distal dominant Circumflex PDA branch. Cardiac  catheterization on 01/24/2020 revealed no change in coronary anatomy, suspect his cardiomyopathy may have been related to underlying A. Fib.   Of note patient has previously been evaluated for syncope, which was felt to be psychogenic in view of his underlying history of seizure disorder. Patient is enrolled in remote patient monitoring for hypertension.  Patient has been unable to tolerate statin therapy in the past due to severe myalgias including atorvastatin, rosuvastatin, and simvastatin.Patient had been on Repatha, however he is not discontinuing this due to concerns that it was causing leg pains and cramps.  Patient presents for follow-up.  Patient has undergone GI and pulmonary work-up which have been unyielding as far as etiology  of patient's ongoing chest pain.  Patient continues to have chest pain, therefore discussed proceeding with coronary angiography versus stress testing.  The left/right heart catheterization procedure was explained to the patient in detail. The indication, alternatives, risks and benefits were reviewed. Complications including but not limited to bleeding, infection, acute kidney injury, blood transfusion, heart rhythm disturbances, contrast (dye) reaction, damage to the arteries or nerves in the legs or hands, cerebrovascular accident, myocardial infarction, need for emergent bypass surgery, blood clots in the legs, possible need for emergent blood transfusion, and rarely death were reviewed and discussed with the patient. The patient voices understanding and wishes to proceed.  As patient has been unable to tolerate statin therapy as well as Repatha, recommend initiation of Inclisirin, however patient wishes to research this prior to starting it and will notify our office at follow-up visit of his decision.  No changes to medications at this time.  Follow-up after heart catheterization.   Alethia Berthold, PA-C 07/17/2021, 3:04 PM2 Office: [phone]

## 2021-07-17 ENCOUNTER — Ambulatory Visit: Payer: Medicare Other | Admitting: Student

## 2021-07-17 ENCOUNTER — Encounter: Payer: Self-pay | Admitting: Student

## 2021-07-17 ENCOUNTER — Other Ambulatory Visit: Payer: Self-pay

## 2021-07-17 VITALS — BP 127/98 | HR 52 | Temp 98.2°F | Ht 69.0 in | Wt 246.0 lb

## 2021-07-17 DIAGNOSIS — I5042 Chronic combined systolic (congestive) and diastolic (congestive) heart failure: Secondary | ICD-10-CM

## 2021-07-17 DIAGNOSIS — E7800 Pure hypercholesterolemia, unspecified: Secondary | ICD-10-CM | POA: Diagnosis not present

## 2021-07-17 DIAGNOSIS — I25118 Atherosclerotic heart disease of native coronary artery with other forms of angina pectoris: Secondary | ICD-10-CM | POA: Diagnosis not present

## 2021-07-17 DIAGNOSIS — I10 Essential (primary) hypertension: Secondary | ICD-10-CM | POA: Diagnosis not present

## 2021-07-24 DIAGNOSIS — J0100 Acute maxillary sinusitis, unspecified: Secondary | ICD-10-CM | POA: Diagnosis not present

## 2021-07-24 DIAGNOSIS — J029 Acute pharyngitis, unspecified: Secondary | ICD-10-CM | POA: Diagnosis not present

## 2021-07-24 DIAGNOSIS — J209 Acute bronchitis, unspecified: Secondary | ICD-10-CM | POA: Diagnosis not present

## 2021-08-06 DIAGNOSIS — I25118 Atherosclerotic heart disease of native coronary artery with other forms of angina pectoris: Secondary | ICD-10-CM | POA: Diagnosis not present

## 2021-08-07 LAB — CBC
Hematocrit: 49.2 % (ref 37.5–51.0)
Hemoglobin: 16.9 g/dL (ref 13.0–17.7)
MCH: 28.8 pg (ref 26.6–33.0)
MCHC: 34.3 g/dL (ref 31.5–35.7)
MCV: 84 fL (ref 79–97)
Platelets: 296 10*3/uL (ref 150–450)
RBC: 5.86 x10E6/uL — ABNORMAL HIGH (ref 4.14–5.80)
RDW: 13.8 % (ref 11.6–15.4)
WBC: 12.3 10*3/uL — ABNORMAL HIGH (ref 3.4–10.8)

## 2021-08-07 LAB — BASIC METABOLIC PANEL
BUN/Creatinine Ratio: 21 — ABNORMAL HIGH (ref 9–20)
BUN: 16 mg/dL (ref 6–24)
CO2: 23 mmol/L (ref 20–29)
Calcium: 9.4 mg/dL (ref 8.7–10.2)
Chloride: 103 mmol/L (ref 96–106)
Creatinine, Ser: 0.77 mg/dL (ref 0.76–1.27)
Glucose: 165 mg/dL — ABNORMAL HIGH (ref 70–99)
Potassium: 3.8 mmol/L (ref 3.5–5.2)
Sodium: 143 mmol/L (ref 134–144)
eGFR: 108 mL/min/{1.73_m2} (ref >59)

## 2021-08-08 ENCOUNTER — Encounter: Payer: Self-pay | Admitting: Neurology

## 2021-08-08 ENCOUNTER — Ambulatory Visit: Payer: Medicare Other | Admitting: Neurology

## 2021-08-13 ENCOUNTER — Other Ambulatory Visit: Payer: Self-pay

## 2021-08-13 ENCOUNTER — Encounter (HOSPITAL_COMMUNITY): Payer: Self-pay | Admitting: Cardiology

## 2021-08-13 ENCOUNTER — Ambulatory Visit (HOSPITAL_COMMUNITY)
Admission: RE | Disposition: A | Discharge status: Home / Self Care | Payer: Self-pay | Source: Ambulatory Visit | Attending: Cardiology

## 2021-08-13 ENCOUNTER — Ambulatory Visit (HOSPITAL_COMMUNITY)
Admission: RE | Admit: 2021-08-13 | Discharge: 2021-08-14 | Disposition: A | Discharge status: Home / Self Care | Payer: Medicare Other | Source: Ambulatory Visit | Attending: Cardiology | Admitting: Cardiology

## 2021-08-13 ENCOUNTER — Other Ambulatory Visit (HOSPITAL_COMMUNITY): Payer: Self-pay

## 2021-08-13 DIAGNOSIS — F17210 Nicotine dependence, cigarettes, uncomplicated: Secondary | ICD-10-CM | POA: Insufficient documentation

## 2021-08-13 DIAGNOSIS — Z888 Allergy status to other drugs, medicaments and biological substances status: Secondary | ICD-10-CM | POA: Diagnosis not present

## 2021-08-13 DIAGNOSIS — I2510 Atherosclerotic heart disease of native coronary artery without angina pectoris: Secondary | ICD-10-CM | POA: Diagnosis present

## 2021-08-13 DIAGNOSIS — Z79899 Other long term (current) drug therapy: Secondary | ICD-10-CM | POA: Diagnosis not present

## 2021-08-13 DIAGNOSIS — Z881 Allergy status to other antibiotic agents status: Secondary | ICD-10-CM | POA: Diagnosis not present

## 2021-08-13 DIAGNOSIS — E669 Obesity, unspecified: Secondary | ICD-10-CM | POA: Diagnosis not present

## 2021-08-13 DIAGNOSIS — I480 Paroxysmal atrial fibrillation: Secondary | ICD-10-CM | POA: Diagnosis not present

## 2021-08-13 DIAGNOSIS — E785 Hyperlipidemia, unspecified: Secondary | ICD-10-CM | POA: Insufficient documentation

## 2021-08-13 DIAGNOSIS — I5022 Chronic systolic (congestive) heart failure: Secondary | ICD-10-CM | POA: Insufficient documentation

## 2021-08-13 DIAGNOSIS — Z885 Allergy status to narcotic agent status: Secondary | ICD-10-CM | POA: Diagnosis not present

## 2021-08-13 DIAGNOSIS — R0609 Other forms of dyspnea: Secondary | ICD-10-CM | POA: Diagnosis not present

## 2021-08-13 DIAGNOSIS — Z7901 Long term (current) use of anticoagulants: Secondary | ICD-10-CM | POA: Insufficient documentation

## 2021-08-13 DIAGNOSIS — E7800 Pure hypercholesterolemia, unspecified: Secondary | ICD-10-CM | POA: Insufficient documentation

## 2021-08-13 DIAGNOSIS — Z8249 Family history of ischemic heart disease and other diseases of the circulatory system: Secondary | ICD-10-CM | POA: Insufficient documentation

## 2021-08-13 DIAGNOSIS — I252 Old myocardial infarction: Secondary | ICD-10-CM | POA: Diagnosis not present

## 2021-08-13 DIAGNOSIS — I2584 Coronary atherosclerosis due to calcified coronary lesion: Secondary | ICD-10-CM | POA: Diagnosis not present

## 2021-08-13 DIAGNOSIS — Z955 Presence of coronary angioplasty implant and graft: Secondary | ICD-10-CM | POA: Insufficient documentation

## 2021-08-13 DIAGNOSIS — Z8673 Personal history of transient ischemic attack (TIA), and cerebral infarction without residual deficits: Secondary | ICD-10-CM | POA: Insufficient documentation

## 2021-08-13 DIAGNOSIS — G4733 Obstructive sleep apnea (adult) (pediatric): Secondary | ICD-10-CM | POA: Insufficient documentation

## 2021-08-13 DIAGNOSIS — I110 Hypertensive heart disease with heart failure: Secondary | ICD-10-CM | POA: Diagnosis not present

## 2021-08-13 DIAGNOSIS — Z6836 Body mass index (BMI) 36.0-36.9, adult: Secondary | ICD-10-CM | POA: Diagnosis not present

## 2021-08-13 DIAGNOSIS — Z9861 Coronary angioplasty status: Secondary | ICD-10-CM

## 2021-08-13 DIAGNOSIS — Z23 Encounter for immunization: Secondary | ICD-10-CM | POA: Insufficient documentation

## 2021-08-13 DIAGNOSIS — I25119 Atherosclerotic heart disease of native coronary artery with unspecified angina pectoris: Secondary | ICD-10-CM | POA: Diagnosis not present

## 2021-08-13 DIAGNOSIS — I209 Angina pectoris, unspecified: Secondary | ICD-10-CM | POA: Diagnosis present

## 2021-08-13 DIAGNOSIS — I25118 Atherosclerotic heart disease of native coronary artery with other forms of angina pectoris: Secondary | ICD-10-CM | POA: Diagnosis not present

## 2021-08-13 HISTORY — PX: INTRAVASCULAR ULTRASOUND/IVUS: CATH118244

## 2021-08-13 HISTORY — PX: CORONARY STENT INTERVENTION: CATH118234

## 2021-08-13 HISTORY — PX: RIGHT/LEFT HEART CATH AND CORONARY ANGIOGRAPHY: CATH118266

## 2021-08-13 LAB — POCT I-STAT EG7
Acid-Base Excess: 0 mmol/L (ref 0.0–2.0)
Bicarbonate: 26.2 mmol/L (ref 20.0–28.0)
Calcium, Ion: 1.13 mmol/L — ABNORMAL LOW (ref 1.15–1.40)
HCT: 42 % (ref 39.0–52.0)
Hemoglobin: 14.3 g/dL (ref 13.0–17.0)
O2 Saturation: 71 %
Potassium: 3.4 mmol/L — ABNORMAL LOW (ref 3.5–5.1)
Sodium: 142 mmol/L (ref 135–145)
TCO2: 28 mmol/L (ref 22–32)
pCO2, Ven: 45.6 mmHg (ref 44.0–60.0)
pH, Ven: 7.366 (ref 7.250–7.430)
pO2, Ven: 39 mmHg (ref 32.0–45.0)

## 2021-08-13 LAB — CREATININE, SERUM
Creatinine, Ser: 0.76 mg/dL (ref 0.61–1.24)
GFR, Estimated: >60 mL/min (ref >60)

## 2021-08-13 LAB — CBC
HCT: 42 % (ref 39.0–52.0)
Hemoglobin: 14.1 g/dL (ref 13.0–17.0)
MCH: 28.3 pg (ref 26.0–34.0)
MCHC: 33.6 g/dL (ref 30.0–36.0)
MCV: 84.3 fL (ref 80.0–100.0)
Platelets: 258 10*3/uL (ref 150–400)
RBC: 4.98 MIL/uL (ref 4.22–5.81)
RDW: 13.4 % (ref 11.5–15.5)
WBC: 10.2 10*3/uL (ref 4.0–10.5)
nRBC: 0 % (ref 0.0–0.2)

## 2021-08-13 LAB — POCT ACTIVATED CLOTTING TIME: Activated Clotting Time: 358 seconds

## 2021-08-13 SURGERY — RIGHT/LEFT HEART CATH AND CORONARY ANGIOGRAPHY
Anesthesia: LOCAL

## 2021-08-13 MED ORDER — MIDAZOLAM HCL 2 MG/2ML IJ SOLN
INTRAMUSCULAR | Status: DC | PRN
  Administered 2021-08-13: 1 mg via INTRAVENOUS
  Administered 2021-08-13: 2 mg via INTRAVENOUS
  Administered 2021-08-13: 1 mg via INTRAVENOUS

## 2021-08-13 MED ORDER — LIDOCAINE HCL (PF) 1 % IJ SOLN
INTRAMUSCULAR | Status: DC | PRN
  Administered 2021-08-13: 2 mL
  Administered 2021-08-13: 1 mL

## 2021-08-13 MED ORDER — HEPARIN SODIUM (PORCINE) 1000 UNIT/ML IJ SOLN
INTRAMUSCULAR | Status: AC
  Filled 2021-08-13: qty 1

## 2021-08-13 MED ORDER — HEPARIN SODIUM (PORCINE) 5000 UNIT/ML IJ SOLN
5000.0000 [IU] | Freq: Three times a day (TID) | INTRAMUSCULAR | Status: DC
  Administered 2021-08-14: 5000 [IU] via SUBCUTANEOUS
  Filled 2021-08-13: qty 1

## 2021-08-13 MED ORDER — NITROGLYCERIN 0.4 MG SL SUBL: 0.4000 mg | SUBLINGUAL_TABLET | SUBLINGUAL | Status: DC | PRN

## 2021-08-13 MED ORDER — EZETIMIBE 10 MG PO TABS
10.0000 mg | ORAL_TABLET | Freq: Every day | ORAL | Status: DC
  Administered 2021-08-13 – 2021-08-14 (×2): 10 mg via ORAL
  Filled 2021-08-13 (×2): qty 1

## 2021-08-13 MED ORDER — HEPARIN (PORCINE) IN NACL 1000-0.9 UT/500ML-% IV SOLN
INTRAVENOUS | Status: AC
  Filled 2021-08-13: qty 1000

## 2021-08-13 MED ORDER — DOCUSATE SODIUM 100 MG PO CAPS: 100.0000 mg | ORAL_CAPSULE | Freq: Two times a day (BID) | ORAL | Status: DC | PRN

## 2021-08-13 MED ORDER — HYDRALAZINE HCL 50 MG PO TABS: 50.0000 mg | ORAL_TABLET | Freq: Three times a day (TID) | ORAL | Status: DC

## 2021-08-13 MED ORDER — LABETALOL HCL 5 MG/ML IV SOLN: 10.0000 mg | INTRAVENOUS | Status: AC | PRN

## 2021-08-13 MED ORDER — SODIUM CHLORIDE 0.9 % WEIGHT BASED INFUSION
1.0000 mL/kg/h | INTRAVENOUS | Status: DC
  Administered 2021-08-13: 250 mL via INTRAVENOUS

## 2021-08-13 MED ORDER — AMLODIPINE BESYLATE 10 MG PO TABS
10.0000 mg | ORAL_TABLET | Freq: Every day | ORAL | Status: DC
  Administered 2021-08-14: 10 mg via ORAL
  Filled 2021-08-13: qty 1

## 2021-08-13 MED ORDER — SODIUM CHLORIDE 0.9% FLUSH: 3.0000 mL | INTRAVENOUS | Status: DC | PRN

## 2021-08-13 MED ORDER — SODIUM CHLORIDE 0.9 % WEIGHT BASED INFUSION
3.0000 mL/kg/h | INTRAVENOUS | Status: AC
  Administered 2021-08-13: 3 mL/kg/h via INTRAVENOUS

## 2021-08-13 MED ORDER — ACETAMINOPHEN 500 MG PO TABS: 500.0000 mg | ORAL_TABLET | Freq: Two times a day (BID) | ORAL | Status: DC | PRN

## 2021-08-13 MED ORDER — ASPIRIN 81 MG PO CHEW: 81.0000 mg | CHEWABLE_TABLET | ORAL | Status: DC

## 2021-08-13 MED ORDER — SODIUM CHLORIDE 0.9% FLUSH
3.0000 mL | Freq: Two times a day (BID) | INTRAVENOUS | Status: DC
  Administered 2021-08-13: 3 mL via INTRAVENOUS

## 2021-08-13 MED ORDER — INFLUENZA VAC SPLIT QUAD 0.5 ML IM SUSY
0.5000 mL | PREFILLED_SYRINGE | INTRAMUSCULAR | Status: AC
  Administered 2021-08-14: 0.5 mL via INTRAMUSCULAR
  Filled 2021-08-13: qty 0.5

## 2021-08-13 MED ORDER — HEPARIN SODIUM (PORCINE) 1000 UNIT/ML IJ SOLN
INTRAMUSCULAR | Status: DC | PRN
  Administered 2021-08-13: 6000 [IU] via INTRAVENOUS
  Administered 2021-08-13 (×2): 4000 [IU] via INTRAVENOUS
  Administered 2021-08-13: 6000 [IU] via INTRAVENOUS

## 2021-08-13 MED ORDER — SODIUM CHLORIDE 0.9 % IV SOLN: 250.0000 mL | INTRAVENOUS | Status: DC | PRN

## 2021-08-13 MED ORDER — CLOPIDOGREL BISULFATE 75 MG PO TABS: 75.0000 mg | ORAL_TABLET | Freq: Every day | ORAL | Status: DC

## 2021-08-13 MED ORDER — CLOPIDOGREL BISULFATE 75 MG PO TABS
75.0000 mg | ORAL_TABLET | Freq: Every day | ORAL | Status: DC
  Administered 2021-08-14: 75 mg via ORAL
  Filled 2021-08-13: qty 1

## 2021-08-13 MED ORDER — VERAPAMIL HCL 2.5 MG/ML IV SOLN
INTRAVENOUS | Status: DC | PRN
  Administered 2021-08-13: 10 mL via INTRA_ARTERIAL

## 2021-08-13 MED ORDER — ONDANSETRON HCL 4 MG/2ML IJ SOLN: 4.0000 mg | Freq: Four times a day (QID) | INTRAMUSCULAR | Status: DC | PRN

## 2021-08-13 MED ORDER — FENTANYL CITRATE (PF) 100 MCG/2ML IJ SOLN
INTRAMUSCULAR | Status: AC
  Filled 2021-08-13: qty 2

## 2021-08-13 MED ORDER — CLOPIDOGREL BISULFATE 300 MG PO TABS
ORAL_TABLET | ORAL | Status: DC | PRN
  Administered 2021-08-13: 600 mg via ORAL

## 2021-08-13 MED ORDER — CLOPIDOGREL BISULFATE 75 MG PO TABS
75.0000 mg | ORAL_TABLET | Freq: Every day | ORAL | 1 refills | Status: DC
  Filled 2021-08-13: qty 30, 30d supply, fill #0

## 2021-08-13 MED ORDER — BISACODYL 5 MG PO TBEC: 5.0000 mg | DELAYED_RELEASE_TABLET | Freq: Every day | ORAL | Status: DC | PRN

## 2021-08-13 MED ORDER — ALBUTEROL SULFATE HFA 108 (90 BASE) MCG/ACT IN AERS: 1.0000 | INHALATION_SPRAY | Freq: Four times a day (QID) | RESPIRATORY_TRACT | Status: DC | PRN

## 2021-08-13 MED ORDER — HYDRALAZINE HCL 50 MG PO TABS
50.0000 mg | ORAL_TABLET | Freq: Three times a day (TID) | ORAL | Status: DC
  Administered 2021-08-13 – 2021-08-14 (×2): 50 mg via ORAL
  Filled 2021-08-13 (×2): qty 1

## 2021-08-13 MED ORDER — LIDOCAINE HCL (PF) 1 % IJ SOLN
INTRAMUSCULAR | Status: AC
  Filled 2021-08-13: qty 30

## 2021-08-13 MED ORDER — HYDRALAZINE HCL 20 MG/ML IJ SOLN: 10.0000 mg | INTRAMUSCULAR | Status: AC | PRN

## 2021-08-13 MED ORDER — SODIUM CHLORIDE 0.9 % IV SOLN: INTRAVENOUS | Status: AC

## 2021-08-13 MED ORDER — ACETAMINOPHEN 325 MG PO TABS: 650.0000 mg | ORAL_TABLET | ORAL | Status: DC | PRN

## 2021-08-13 MED ORDER — MIDAZOLAM HCL 2 MG/2ML IJ SOLN
INTRAMUSCULAR | Status: AC
  Filled 2021-08-13: qty 2

## 2021-08-13 MED ORDER — ASPIRIN 81 MG PO CHEW
CHEWABLE_TABLET | ORAL | Status: AC
  Administered 2021-08-13: 81 mg
  Filled 2021-08-13: qty 1

## 2021-08-13 MED ORDER — NITROGLYCERIN 1 MG/10 ML FOR IR/CATH LAB
INTRA_ARTERIAL | Status: DC | PRN
  Administered 2021-08-13 (×4): 200 ug via INTRACORONARY

## 2021-08-13 MED ORDER — SODIUM CHLORIDE 0.9% FLUSH: 3.0000 mL | Freq: Two times a day (BID) | INTRAVENOUS | Status: DC

## 2021-08-13 MED ORDER — IOHEXOL 350 MG/ML SOLN
INTRAVENOUS | Status: DC | PRN
  Administered 2021-08-13: 210 mL

## 2021-08-13 MED ORDER — VERAPAMIL HCL 2.5 MG/ML IV SOLN
INTRAVENOUS | Status: AC
  Filled 2021-08-13: qty 2

## 2021-08-13 MED ORDER — PANTOPRAZOLE SODIUM 20 MG PO TBEC
20.0000 mg | DELAYED_RELEASE_TABLET | Freq: Two times a day (BID) | ORAL | Status: DC
  Administered 2021-08-13 – 2021-08-14 (×2): 20 mg via ORAL
  Filled 2021-08-13 (×2): qty 1

## 2021-08-13 MED ORDER — NITROGLYCERIN 1 MG/10 ML FOR IR/CATH LAB
INTRA_ARTERIAL | Status: AC
  Filled 2021-08-13: qty 10

## 2021-08-13 MED ORDER — CLOPIDOGREL BISULFATE 300 MG PO TABS
ORAL_TABLET | ORAL | Status: AC
  Filled 2021-08-13: qty 2

## 2021-08-13 MED ORDER — FENTANYL CITRATE (PF) 100 MCG/2ML IJ SOLN
INTRAMUSCULAR | Status: DC | PRN
  Administered 2021-08-13 (×3): 50 ug via INTRAVENOUS
  Administered 2021-08-13: 25 ug via INTRAVENOUS

## 2021-08-13 MED ORDER — METOPROLOL TARTRATE 100 MG PO TABS
100.0000 mg | ORAL_TABLET | Freq: Two times a day (BID) | ORAL | Status: DC
  Administered 2021-08-13 – 2021-08-14 (×2): 100 mg via ORAL
  Filled 2021-08-13 (×2): qty 1

## 2021-08-13 MED ORDER — HEPARIN (PORCINE) IN NACL 1000-0.9 UT/500ML-% IV SOLN
INTRAVENOUS | Status: DC | PRN
  Administered 2021-08-13 (×3): 500 mL

## 2021-08-13 SURGICAL SUPPLY — 31 items
BALLN EMERGE MR 2.0X8 (BALLOONS) ×2
BALLN EMERGE MR 2.5X8 (BALLOONS) ×2
BALLN EMERGE MR 3.5X12 (BALLOONS) ×2
BALLN SAPPHIRE ~~LOC~~ 4.0X15 (BALLOONS) ×2 IMPLANT
BALLN WOLVERINE 3.50X10 (BALLOONS) ×2
BALLOON EMERGE MR 2.0X8 (BALLOONS) ×1 IMPLANT
BALLOON EMERGE MR 2.5X8 (BALLOONS) ×1 IMPLANT
BALLOON EMERGE MR 3.5X12 (BALLOONS) ×1 IMPLANT
BALLOON WOLVERINE 3.50X10 (BALLOONS) ×1 IMPLANT
CATH BALLN WEDGE 5F 110CM (CATHETERS) ×2 IMPLANT
CATH INFINITI JR4 5F (CATHETERS) ×2 IMPLANT
CATH LAUNCHER 6FR EBU 3.75 (CATHETERS) ×2 IMPLANT
CATH LAUNCHER 6FR EBU3.5 (CATHETERS) ×2 IMPLANT
CATH LAUNCHER 6FR JR4 (CATHETERS) ×2 IMPLANT
CATH OPTICROSS HD (CATHETERS) ×2 IMPLANT
CATH OPTITORQUE TIG 4.0 5F (CATHETERS) ×2 IMPLANT
DEVICE RAD COMP TR BAND LRG (VASCULAR PRODUCTS) ×2 IMPLANT
GLIDESHEATH SLEND A-KIT 6F 22G (SHEATH) ×2 IMPLANT
GUIDEWIRE INQWIRE 1.5J.035X260 (WIRE) ×1 IMPLANT
INQWIRE 1.5J .035X260CM (WIRE) ×2
KIT ENCORE 26 ADVANTAGE (KITS) ×2 IMPLANT
KIT HEART LEFT (KITS) ×2 IMPLANT
KIT HEMO VALVE WATCHDOG (MISCELLANEOUS) ×2 IMPLANT
PACK CARDIAC CATHETERIZATION (CUSTOM PROCEDURE TRAY) ×2 IMPLANT
SHEATH GLIDE SLENDER 4/5FR (SHEATH) ×2 IMPLANT
SLED PULL BACK IVUS (MISCELLANEOUS) ×2 IMPLANT
STENT ONYX FRONTIER 3.5X38 (Permanent Stent) ×2 IMPLANT
TRANSDUCER W/STOPCOCK (MISCELLANEOUS) ×2 IMPLANT
TUBING CIL FLEX 10 FLL-RA (TUBING) ×2 IMPLANT
WIRE ASAHI PROWATER 180CM (WIRE) ×2 IMPLANT
WIRE COUGAR XT STRL 190CM (WIRE) ×4 IMPLANT

## 2021-08-13 NOTE — Progress Notes (Signed)
Removed venous sheath from right brachial artery at 1740. Manual pressure held for 10 minutes. Site clean/dry/intact, level 0.

## 2021-08-13 NOTE — Interval H&P Note (Signed)
History and Physical Interval Note:  08/13/2021 12:37 PM  Danny Kelley  has presented today for surgery, with the diagnosis of heart failure - cad - unstable angina.  The various methods of treatment have been discussed with the patient and family. After consideration of risks, benefits and other options for treatment, the patient has consented to  Procedure(s): RIGHT/LEFT HEART CATH AND CORONARY ANGIOGRAPHY (N/A) as a surgical intervention.  The patient's history has been reviewed, patient examined, no change in status, stable for surgery.  I have reviewed the patient's chart and labs.  Questions were answered to the patient's satisfaction.    2012 Appropriate Use Criteria for Diagnostic Catheterization Home / Select Test of Interest Indication for RHC Cardiomyopathies Cardiomyopathies (Right and Left Heart Catheterization OR Right Heart Catheterization Alone With/Wit Cardiomyopathies (Right and Left Heart Catheterization OR Right Heart Catheterization Alone With/Without Left Ventriculography and Coronary Angiography) Link Here: [email or web address] Indication:  Known or suspected cardiomyopathy with or without heart failure A (7) Indication: 93; Score 7   Danny Kelley

## 2021-08-13 NOTE — Plan of Care (Signed)
  Problem: Activity: Goal: Risk for activity intolerance will decrease Outcome: Progressing   Problem: Nutrition: Goal: Adequate nutrition will be maintained Outcome: Progressing   Problem: Coping: Goal: Level of anxiety will decrease Outcome: Progressing   

## 2021-08-13 NOTE — CV Procedure (Signed)
PTCA to diag IVUS guided stenting to LAD 3.5X38 mm Onyx PTCA to prox RCA. Calcified lesion. Will need staging and consideration for atherectomy another day. Keep overnight. Recommend one dose of Aspirin 81 mg tomorrow, then resume eliquis, along with Plavix 75 mg. Full report to follow.   Nigel Mormon, MD Pager: [phone] Office: [phone]

## 2021-08-13 NOTE — H&P (Signed)
OV 07/17/2021 copied for documentation    Primary Physician/Referring:  Angelina Sheriff, MD  Patient ID: Danny Kelley, male    DOB: 1965-11-12, 55 y.o.   MRN: [id number]  Chief Complaint  Patient presents with   Coronary Artery Disease   Follow-up   Hypertension   HPI:    Danny Kelley  is a 55 y.o. Caucasian male patient with history of cardiomyopathy with chronic systolic heart failure, hypertension, hyperlipidemia (statin intolerant), obstructive sleep apnea compliant with CPAP, tobacco use.  Patient has history of TIA in Feb 2018 while on anticoagulants, paroxysmal atrial fibrillation status post ablation 11/13/2016 and 03/01/2020 (Multaq and sotalol ineffective).  History of unstable angina pectoris on 11/26/2017, angioplasty to superdominant left circumflex coronary artery with 4.5 mm resolute DES in the midsegment, patent stent placed in Distal Cx and OM 1 in 2014, 03/22/2019, stenting to high-grade stenosis in the distal dominant Circumflex PDA branch. Cardiac catheterization on 01/24/2020 revealed no change in coronary anatomy, suspect his cardiomyopathy may have been related to underlying A. Fib.   Of note patient has previously been evaluated for syncope, which was felt to be psychogenic in view of his underlying history of seizure disorder. Patient is enrolled in remote patient monitoring for hypertension.  Patient has been unable to tolerate statin therapy in the past due to severe myalgias including atorvastatin, rosuvastatin, and simvastatin.  Patient had been on Repatha, however he is not discontinuing this due to concerns that it was causing leg pains and cramps.  Patient presents for follow-up, unfortunately continues to experience episodes of chest pain which he describes as sharp and stabbing multiple times throughout the day both with rest and exertion.  He also continues to have episodes of dizziness.  Although he does report fatigue is improving.  Unfortunately patient  continues to smoke approximately 5 cigarettes/day.  He is enrolled in remote patient monitoring of blood pressure.  Past Medical History:  Diagnosis Date   Arthritis    "a little in my right knee" (07/14/2016)   CAD (coronary artery disease)    Chronic congestive heart failure with left ventricular diastolic dysfunction (HCC)    Complication of anesthesia    Pt reports "they have a hard time waking me up"   GERD (gastroesophageal reflux disease)    hx   Headache    "with every heart issue that I have" (07/14/2016)   High cholesterol    History of hiatal hernia 1990s   "fixed it w/scope down my throat"   Hypertension    Myocardial infarction ([place]) 05/2013   stents: left PDA, 1st OM at [facility]   Obesity    Persistent atrial fibrillation ([place])    Pneumonia ~ 1992   QT prolongation 07/15/2016   Seasonal allergies    "I take Allegra prn" (07/14/2016)   Tobacco abuse 12/16/2016   Past Surgical History:  Procedure Laterality Date   APPENDECTOMY  1984   ATRIAL FIBRILLATION ABLATION N/A 03/01/2020   Procedure: ATRIAL FIBRILLATION ABLATION;  Surgeon: [name], MD;  Location: [place] [facility];  Service: Cardiovascular;  Laterality: N/A;   CARDIOVERSION N/A 05/27/2016   Procedure: CARDIOVERSION;  Surgeon: [name], MD;  Location: [facility] ENDOSCOPY;  Service: Cardiovascular;  Laterality: N/A;   CARDIOVERSION N/A 06/11/2016   Procedure: CARDIOVERSION;  Surgeon: [name], MD;  Location: [facility] [employer];  Service: Cardiovascular;  Laterality: N/A;   CARDIOVERSION N/A 03/21/2020   Procedure: CARDIOVERSION;  Surgeon: Geralynn Rile, MD;  Location: [facility]  ENDOSCOPY;  Service: Cardiovascular;  Laterality: N/A;   CORONARY ANGIOPLASTY WITH STENT PLACEMENT  05/30/2013   "2 stents put in at [facility]" & /stent card   CORONARY STENT INTERVENTION N/A 11/27/2017   Procedure: CORONARY STENT INTERVENTION;  Surgeon: [name], MD;  Location: [place] [facility];  Service: Cardiovascular;   Laterality: N/A;   CORONARY STENT INTERVENTION N/A 03/21/2019   Procedure: CORONARY STENT INTERVENTION;  Surgeon: [name], MD;  Location: [place] [facility];  Service: Cardiovascular;  Laterality: N/A;   ELECTROPHYSIOLOGIC STUDY N/A 11/13/2016   Procedure: Atrial Fibrillation Ablation;  Surgeon: [name], MD;  Location: [place] [facility];  Service: Cardiovascular;  Laterality: N/A;   ESOPHAGOGASTRODUODENOSCOPY (EGD) WITH ESOPHAGEAL DILATION  1990s X 2   INTRAVASCULAR PRESSURE WIRE/FFR STUDY N/A 09/21/2018   Procedure: INTRAVASCULAR PRESSURE WIRE/FFR STUDY;  Surgeon: [name], MD;  Location: [place] [facility];  Service: Cardiovascular;  Laterality: N/A;   INTRAVASCULAR PRESSURE WIRE/FFR STUDY N/A 03/21/2019   Procedure: INTRAVASCULAR PRESSURE WIRE/FFR STUDY;  Surgeon: [name], MD;  Location: [place] [facility];  Service: Cardiovascular;  Laterality: N/A;   INTRAVASCULAR PRESSURE WIRE/FFR STUDY N/A 01/24/2020   Procedure: INTRAVASCULAR PRESSURE WIRE/FFR STUDY;  Surgeon: [name], MD;  Location: [place] [facility];  Service: Cardiovascular;  Laterality: N/A;   KNEE ARTHROSCOPY Right 1986; ~ 1995 X [place]  2015   LEFT HEART CATH AND CORONARY ANGIOGRAPHY N/A 11/27/2017   Procedure: LEFT HEART CATH AND CORONARY ANGIOGRAPHY;  Surgeon: [name], MD;  Location: [place] [facility];  Service: Cardiovascular;  Laterality: N/A;   LEFT HEART CATH AND CORONARY ANGIOGRAPHY N/A 09/21/2018   Procedure: LEFT HEART CATH AND CORONARY ANGIOGRAPHY;  Surgeon: [name], MD;  Location: [place] [facility];  Service: Cardiovascular;  Laterality: N/A;   LEFT HEART CATH AND CORONARY ANGIOGRAPHY N/A 03/21/2019   Procedure: LEFT HEART CATH AND CORONARY ANGIOGRAPHY;  Surgeon: [name], MD;  Location: [place] [facility];  Service: Cardiovascular;  Laterality: N/A;   REPAIR OF ESOPHAGUS  1998   perforation of the distal esophagus from bad heartburn   RIGHT/LEFT HEART CATH AND CORONARY  ANGIOGRAPHY N/A 01/24/2020   Procedure: RIGHT/LEFT HEART CATH AND CORONARY ANGIOGRAPHY;  Surgeon: [name], MD;  Location: [place] [facility];  Service: Cardiovascular;  Laterality: N/A;   TEE WITHOUT CARDIOVERSION N/A 11/13/2016   Procedure: TRANSESOPHAGEAL ECHOCARDIOGRAM (TEE);  Surgeon: Thayer Headings, MD;  Location: [facility] ENDOSCOPY;  Service: Cardiovascular;  Laterality: N/A;   Family History  Problem Relation Age of Onset   Heart disease Mother 101       CABG age 27   Breast cancer Mother    Tongue cancer Mother    Diabetes Mother    Heart attack Father 77       Father had around 7 heart attacks per pt   Diabetes Father    Lung cancer Father    Congestive Heart Failure Father    Lung cancer Paternal Uncle    Colon cancer Neg Hx    Esophageal cancer Neg Hx    Inflammatory bowel disease Neg Hx    Liver disease Neg Hx    Pancreatic cancer Neg Hx    Rectal cancer Neg Hx    Stomach cancer Neg Hx     Social History   Tobacco Use   Smoking status: Every Day    Packs/day: 0.25    Years: 25.00    Pack years: 6.25  Types: Cigarettes, E-cigarettes    Start date: 1989   Smokeless tobacco: Never  Substance Use Topics   Alcohol use: No  Marital status: Married   ROS  Review of Systems  Constitutional: Positive for malaise/fatigue (improving). Negative for decreased appetite, weight gain and weight loss.  Cardiovascular:  Positive for chest pain (sharp) and dyspnea on exertion. Negative for claudication, leg swelling, near-syncope, orthopnea, palpitations, paroxysmal nocturnal dyspnea and syncope.  Respiratory:  Negative for shortness of breath.   Hematologic/Lymphatic: Does not bruise/bleed easily.  Gastrointestinal:  Negative for abdominal pain and melena.  Neurological:  Positive for dizziness. Negative for headaches and weakness.  Psychiatric/Behavioral:  Negative for suicidal ideas.   All other systems reviewed and are negative. Objective  Blood pressure (!) 127/98, pulse  (!) 52, temperature 98.2 F (36.8 C), height 5\' 9"  (1.753 m), weight 246 lb (111.6 kg), SpO2 96 %.  Vitals with BMI 07/17/2021 06/05/2021 05/16/2021  Height 5\' 9"  5\' 9"  -  Weight 246 lbs 250 lbs -  BMI 58.83 25.4 -  Systolic 982 641 583  Diastolic 98 80 71  Pulse 52 76 54     Physical Exam Vitals reviewed.  Constitutional:      Comments: Moderately obese  Cardiovascular:     Rate and Rhythm: Normal rate and regular rhythm. No extrasystoles are present.    Pulses: Intact distal pulses.     Heart sounds: Normal heart sounds, S1 normal and S2 normal. No murmur heard.   No gallop.     Comments: No leg edema, no JVD. Pulmonary:     Effort: Pulmonary effort is normal. No accessory muscle usage or respiratory distress.     Breath sounds: Normal breath sounds. No wheezing, rhonchi or rales.  Musculoskeletal:     Right lower leg: No edema.     Left lower leg: No edema.  Neurological:     Mental Status: He is alert.   Laboratory examination:   Recent Labs    12/10/20 1323 01/09/21 1528 05/16/21 1000 05/16/21 1020  NA 135 137 135 137  K 4.2 3.6 3.3* 3.4*  CL 98 101 103 101  CO2 25 28 25   --   GLUCOSE 155* 128* 164* 158*  BUN 16 13 10 11   CREATININE 0.91 1.07 1.03 1.00  CALCIUM 8.7 9.1 8.5*  --   GFRNONAA 97  --  >60  --   GFRAA 112  --   --   --    CrCl cannot be calculated (Patient's most recent lab result is older than the maximum 21 days allowed.).  CMP Latest Ref Rng & Units 05/16/2021 05/16/2021 01/09/2021  Glucose 70 - 99 mg/dL 158(H) 164(H) 128(H)  BUN 6 - 20 mg/dL 11 10 13   Creatinine 0.61 - 1.24 mg/dL 1.00 1.03 1.07  Sodium 135 - 145 mmol/L 137 135 137  Potassium 3.5 - 5.1 mmol/L 3.4(L) 3.3(L) 3.6  Chloride 98 - 111 mmol/L 101 103 101  CO2 22 - 32 mmol/L - 25 28  Calcium 8.9 - 10.3 mg/dL - 8.5(L) 9.1  Total Protein 6.5 - 8.1 g/dL - 6.5 7.5  Total Bilirubin 0.3 - 1.2 mg/dL - 0.3 0.4  Alkaline Phos 38 - 126 U/L - 98 111  AST 15 - 41 U/L - 20 14  ALT 0 - 44 U/L - 25  21   CBC Latest Ref Rng & Units 05/16/2021 05/16/2021 01/09/2021  WBC 4.0 - 10.5 K/uL - 9.9 10.3  Hemoglobin 13.0 - 17.0 g/dL 15.6  15.9 16.9  Hematocrit 39.0 - 52.0 % 46.0 47.0 48.2  Platelets 150 - 400 K/uL - 305 279.0   Lipid Panel     Component Value Date/Time   CHOL 239 (H) 01/02/2020 1445   TRIG 143 01/02/2020 1445   HDL 62 01/02/2020 1445   CHOLHDL 5.0 11/27/2017 0634   VLDL 16 11/27/2017 0634   LDLCALC 152 (H) 01/02/2020 1445   HEMOGLOBIN A1C Lab Results  Component Value Date   HGBA1C 6.0 (H) 11/27/2017   MPG 125.5 11/27/2017   TSH Recent Labs    01/09/21 1528  TSH 2.22   BNP No results found for: BNP  ProBNP    Component Value Date/Time   PROBNP 74 12/10/2020 1323   External Labs:  05/31/21  CR 0.8, sodium 139, potassium 3.6 Hgb 16.1, HCT 46, platelet 230  02/28/2021:  TSH 1.769   Allergies   Allergies  Allergen Reactions   Codeine Anaphylaxis   Crestor [Rosuvastatin] Other (See Comments)    Severe leg cramps   Albuterol Swelling   Dilaudid [Hydromorphone] Itching and Swelling   Isosorbide     Muscle pain and cramps   Lexapro [Escitalopram]     Have bad thoughts made anxiety worse    Lipitor [Atorvastatin Calcium] Other (See Comments)    myalgias   Erythromycin Other (See Comments)    Hallucinations    Oxycodone Itching and Rash    Swelling to face, hands, mouth Completley intolerant to any meds with oxycodone in it     Medications Prior to Visit:   Outpatient Medications Prior to Visit  Medication Sig Dispense Refill   acetaminophen (TYLENOL) 500 MG tablet Take 500 mg by mouth 2 (two) times daily as needed for moderate pain.     albuterol (VENTOLIN HFA) 108 (90 Base) MCG/ACT inhaler Inhale into the lungs.     amLODipine (NORVASC) 10 MG tablet Take 1 tablet (10 mg total) by mouth daily. 90 tablet 3   bisacodyl (DULCOLAX) 5 MG EC tablet Take 5 mg by mouth daily as needed.     docusate sodium (COLACE) 100 MG capsule Take 100 mg by mouth 2  (two) times daily as needed.     ELIQUIS 5 MG TABS tablet Take 1 tablet by mouth twice daily 180 tablet 0   ezetimibe (ZETIA) 10 MG tablet Take 1 tablet (10 mg total) by mouth daily. 90 tablet 2   fexofenadine (ALLEGRA) 180 MG tablet Take 180 mg by mouth daily as needed (seasonal allergies).     hydrALAZINE (APRESOLINE) 50 MG tablet Take 1 tablet (50 mg total) by mouth 3 (three) times daily. With additional dose as needed for BP >150/90 mmHg. 90 tablet 2   metoprolol tartrate (LOPRESSOR) 100 MG tablet Take 1 tablet by mouth twice daily (Patient taking differently: Take 100 mg by mouth 2 (two) times daily.) 180 tablet 0   nitroGLYCERIN (NITROSTAT) 0.4 MG SL tablet Place 1 tablet (0.4 mg total) under the tongue every 5 (five) minutes as needed for chest pain. 25 tablet 2   pantoprazole (PROTONIX) 20 MG tablet Take 1 tablet (20 mg total) by mouth 2 (two) times daily. 180 tablet 2   sacubitril-valsartan (ENTRESTO) 97-103 MG Take 1 tablet by mouth 2 (two) times daily. 60 tablet 3   Evolocumab (REPATHA) 140 MG/ML SOSY Inject 1 mL into the skin every 14 (fourteen) days. 2 mL 3   No facility-administered medications prior to visit.   Final Medications at End of Visit  Current Meds  Medication Sig   acetaminophen (TYLENOL) 500 MG tablet Take 500 mg by mouth 2 (two) times daily as needed for moderate pain.   albuterol (VENTOLIN HFA) 108 (90 Base) MCG/ACT inhaler Inhale into the lungs.   amLODipine (NORVASC) 10 MG tablet Take 1 tablet (10 mg total) by mouth daily.   bisacodyl (DULCOLAX) 5 MG EC tablet Take 5 mg by mouth daily as needed.   docusate sodium (COLACE) 100 MG capsule Take 100 mg by mouth 2 (two) times daily as needed.   ELIQUIS 5 MG TABS tablet Take 1 tablet by mouth twice daily   ezetimibe (ZETIA) 10 MG tablet Take 1 tablet (10 mg total) by mouth daily.   fexofenadine (ALLEGRA) 180 MG tablet Take 180 mg by mouth daily as needed (seasonal allergies).   hydrALAZINE (APRESOLINE) 50 MG tablet  Take 1 tablet (50 mg total) by mouth 3 (three) times daily. With additional dose as needed for BP >150/90 mmHg.   metoprolol tartrate (LOPRESSOR) 100 MG tablet Take 1 tablet by mouth twice daily (Patient taking differently: Take 100 mg by mouth 2 (two) times daily.)   nitroGLYCERIN (NITROSTAT) 0.4 MG SL tablet Place 1 tablet (0.4 mg total) under the tongue every 5 (five) minutes as needed for chest pain.   pantoprazole (PROTONIX) 20 MG tablet Take 1 tablet (20 mg total) by mouth 2 (two) times daily.   sacubitril-valsartan (ENTRESTO) 97-103 MG Take 1 tablet by mouth 2 (two) times daily.   Radiology:   No results found.  Cardiac Studies:    PCV ECHOCARDIOGRAM COMPLETE 11/15/2020 Study Quality: Technically difficult study. Normal LV systolic function with visual EF 50-55%. Left ventricle cavity is normal in size. Normal left ventricular wall thickness. Normal global wall motion. Normal diastolic filling pattern, normal LAP. Insignificant pericardial effusion. There is no hemodynamic significance. No significant valvular heart disease. Compared to prior study dated 12/28/2019: LVEF improved from 25-30% to 50-55% otherwise no significant change.  14-day cardiac event monitor 08/21/2020: Sinus rhythm Premature atrial contractions and premature ventricular contractions are noted Rare nonsustained ventricular tachycardia No sustained arrhythmias  Left and right heart catheterization 01/24/2020: Right heart: RA 18/18, mean 17; RV 44/10, EDP 15; PA 48/19, mean 33.  PA saturation 72%.  PW 25/26, mean 23 mmHg.  Aortic saturation 98%. Cardiac output 4.73, cardiac index 2.09, reduced.  QP/QS 1.00. Impression: Mild decrease in cardiac output and cardiac index.  Mild to moderate pulmonary hypertension related to elevated LVEDP.   Left heart catheterization: LV pressure 139/17, EDP 31 mmHg.  Aortic pressure 144/97, mean 122 mmHg.  No pressure gradient. Right coronary artery is nondominant but a large  vessel with a proximal 70% stenosis unchanged from cardiac catheterization on 03/21/2019. Left main is large.  It trifurcates. Circumflex: Dominant vessel.  Previously placed stent in the proximal to mid circumflex, OM1 and also PDA widely patent. Ramus intermediate: Large vessel, mild disease. LAD: Large vessel, diffuse 60 to 70% in some views appears to be almost 80% proximal long segment stenosis.  DFR 0.91, FFR 0.89,  not hemodynamically significant.  Lesion slightly progressed compared to prior angiography in 2020.    Overall impression: Nonischemic cardiomyopathy, probably related to underlying atrial fibrillation and hypertensive heart disease.  No significant change in coronary anatomy, has moderate underlying disease in the LAD but not hemodynamically significant and previously placed stent in the circumflex widely patent (Circumflex stent placed 11/27/2017, 4.5 x 18 mm resolute widely patent, patent 3.5 x 18 mm Xience DES to  dominant distal left circumflex, 2.5 x 12 mm Xience to OM1 placed 05/30/2013.)    Recommendation: Would consider atrial fibrillation ablation to evaluate and see whether his LVEF would improve.  Aggressive medical management of chronic systolic and diastolic heart failure, including weight loss and aggressive blood pressure control would also help.  95 mill contrast utilized.  Sleep study X 2 negative   Event Monitor 30 days 07/28/2018: Paroxysmal episodes of atrial fibrillation with controlled ventricular response was noted along with PVCs. Some of this symptoms of chest pain, palpitations and dizziness correlated with atrial fibrillation, others showed normal sinus rhythm. No heart block, no other significant arrhythmias.  Direct current cardioversion ( Ist 05/27/16) 06/11/2016: 150x2, 200 x1 to NSR on Sotalol. A. Fib ablation by Dr. [name] on 11/13/2016  EKG   03/05/2021: Sinus rhythm with frequent PVCs including trigeminal pattern at a rate of 75 bpm.  Normal  axis.  Incomplete right bundle branch block.  No evidence of ischemia or underlying injury pattern.  12/06/20: Sinus bradycardia at a rate of 55 bpm.  Normal axis.  No evidence of ischemia or injury pattern.   01/04/2020: Atrial fibrillation with rapid ventricular response at the rate of 111 bpm, normal axis, diffuse ST-T wave abnormality, cannot exclude inferior ischemia, anterolateral ischemia.  Normal QT interval.   No significant change from  EKG 12/20/2019: Atrial fibrillation with RVR at rate of 121 bpm  03/31/2019: Atrial fibrillation controlled ventricular response at the rate of 70 bpm, normal axis.   Assessment     ICD-10-CM   1. Coronary artery disease of native artery of native heart with stable angina pectoris (HCC)  X73.532 Basic metabolic panel    CBC    2. Chronic combined systolic and diastolic heart failure (HCC)  I50.42     3. Essential hypertension  I10     4. Hypercholesteremia  E78.00       No orders of the defined types were placed in this encounter.   Medications Discontinued During This Encounter  Medication Reason   Evolocumab (REPATHA) 140 MG/ML SOSY Side effect (s)    This patients CHA2DS2-VASc Score 5 (CHF, HTN, Vasc, TIA) and yearly risk of stroke 7.2%.   Recommendations:   Danny Kelley  is a  55 y.o. Caucasian male patient with history of cardiomyopathy with chronic systolic heart failure, hypertension, hyperlipidemia (statin intolerant), obstructive sleep apnea compliant with CPAP, tobacco use.  Patient has history of TIA in Feb 2018 while on anticoagulants, paroxysmal atrial fibrillation status post ablation 11/13/2016 and 03/01/2020 (Multaq and sotalol ineffective).  History of unstable angina pectoris on 11/26/2017, angioplasty to superdominant left circumflex coronary artery with 4.5 mm resolute DES in the midsegment, patent stent placed in Distal Cx and OM 1 in 2014, 03/22/2019, stenting to high-grade stenosis in the distal dominant Circumflex PDA  branch. Cardiac catheterization on 01/24/2020 revealed no change in coronary anatomy, suspect his cardiomyopathy may have been related to underlying A. Fib.   Of note patient has previously been evaluated for syncope, which was felt to be psychogenic in view of his underlying history of seizure disorder. Patient is enrolled in remote patient monitoring for hypertension.  Patient has been unable to tolerate statin therapy in the past due to severe myalgias including atorvastatin, rosuvastatin, and simvastatin.Patient had been on Repatha, however he is not discontinuing this due to concerns that it was causing leg pains and cramps.  Patient presents for follow-up.  Patient has undergone GI and pulmonary work-up which  have been unyielding as far as etiology of patient's ongoing chest pain.  Patient continues to have chest pain, therefore discussed proceeding with coronary angiography versus stress testing.  The left/right heart catheterization procedure was explained to the patient in detail. The indication, alternatives, risks and benefits were reviewed. Complications including but not limited to bleeding, infection, acute kidney injury, blood transfusion, heart rhythm disturbances, contrast (dye) reaction, damage to the arteries or nerves in the legs or hands, cerebrovascular accident, myocardial infarction, need for emergent bypass surgery, blood clots in the legs, possible need for emergent blood transfusion, and rarely death were reviewed and discussed with the patient. The patient voices understanding and wishes to proceed.  As patient has been unable to tolerate statin therapy as well as Repatha, recommend initiation of Inclisirin, however patient wishes to research this prior to starting it and will notify our office at follow-up visit of his decision.  No changes to medications at this time.  Follow-up after heart catheterization.   Alethia Berthold, PA-C 07/17/2021, 3:04 PM2 Office:  [phone]

## 2021-08-14 ENCOUNTER — Other Ambulatory Visit (HOSPITAL_COMMUNITY): Payer: Self-pay

## 2021-08-14 ENCOUNTER — Encounter (HOSPITAL_COMMUNITY): Payer: Self-pay | Admitting: Cardiology

## 2021-08-14 DIAGNOSIS — Z7901 Long term (current) use of anticoagulants: Secondary | ICD-10-CM | POA: Diagnosis not present

## 2021-08-14 DIAGNOSIS — I5022 Chronic systolic (congestive) heart failure: Secondary | ICD-10-CM | POA: Diagnosis not present

## 2021-08-14 DIAGNOSIS — I252 Old myocardial infarction: Secondary | ICD-10-CM | POA: Diagnosis not present

## 2021-08-14 DIAGNOSIS — I25119 Atherosclerotic heart disease of native coronary artery with unspecified angina pectoris: Secondary | ICD-10-CM | POA: Diagnosis not present

## 2021-08-14 DIAGNOSIS — Z888 Allergy status to other drugs, medicaments and biological substances status: Secondary | ICD-10-CM | POA: Diagnosis not present

## 2021-08-14 DIAGNOSIS — I2584 Coronary atherosclerosis due to calcified coronary lesion: Secondary | ICD-10-CM | POA: Diagnosis not present

## 2021-08-14 DIAGNOSIS — I25118 Atherosclerotic heart disease of native coronary artery with other forms of angina pectoris: Secondary | ICD-10-CM | POA: Diagnosis not present

## 2021-08-14 DIAGNOSIS — I480 Paroxysmal atrial fibrillation: Secondary | ICD-10-CM | POA: Diagnosis not present

## 2021-08-14 DIAGNOSIS — Z8249 Family history of ischemic heart disease and other diseases of the circulatory system: Secondary | ICD-10-CM | POA: Diagnosis not present

## 2021-08-14 DIAGNOSIS — Z885 Allergy status to narcotic agent status: Secondary | ICD-10-CM | POA: Diagnosis not present

## 2021-08-14 DIAGNOSIS — Z79899 Other long term (current) drug therapy: Secondary | ICD-10-CM | POA: Diagnosis not present

## 2021-08-14 DIAGNOSIS — G4733 Obstructive sleep apnea (adult) (pediatric): Secondary | ICD-10-CM | POA: Diagnosis not present

## 2021-08-14 DIAGNOSIS — E785 Hyperlipidemia, unspecified: Secondary | ICD-10-CM | POA: Diagnosis not present

## 2021-08-14 DIAGNOSIS — F17210 Nicotine dependence, cigarettes, uncomplicated: Secondary | ICD-10-CM | POA: Diagnosis not present

## 2021-08-14 DIAGNOSIS — Z955 Presence of coronary angioplasty implant and graft: Secondary | ICD-10-CM | POA: Diagnosis not present

## 2021-08-14 DIAGNOSIS — Z23 Encounter for immunization: Secondary | ICD-10-CM | POA: Diagnosis not present

## 2021-08-14 DIAGNOSIS — E7800 Pure hypercholesterolemia, unspecified: Secondary | ICD-10-CM | POA: Diagnosis not present

## 2021-08-14 DIAGNOSIS — R0609 Other forms of dyspnea: Secondary | ICD-10-CM | POA: Diagnosis not present

## 2021-08-14 DIAGNOSIS — Z881 Allergy status to other antibiotic agents status: Secondary | ICD-10-CM | POA: Diagnosis not present

## 2021-08-14 DIAGNOSIS — I110 Hypertensive heart disease with heart failure: Secondary | ICD-10-CM | POA: Diagnosis not present

## 2021-08-14 DIAGNOSIS — Z8673 Personal history of transient ischemic attack (TIA), and cerebral infarction without residual deficits: Secondary | ICD-10-CM | POA: Diagnosis not present

## 2021-08-14 LAB — CBC
HCT: 42.6 % (ref 39.0–52.0)
Hemoglobin: 14.3 g/dL (ref 13.0–17.0)
MCH: 28.5 pg (ref 26.0–34.0)
MCHC: 33.6 g/dL (ref 30.0–36.0)
MCV: 84.9 fL (ref 80.0–100.0)
Platelets: 281 10*3/uL (ref 150–400)
RBC: 5.02 MIL/uL (ref 4.22–5.81)
RDW: 13.4 % (ref 11.5–15.5)
WBC: 12.5 10*3/uL — ABNORMAL HIGH (ref 4.0–10.5)
nRBC: 0 % (ref 0.0–0.2)

## 2021-08-14 LAB — POCT I-STAT 7, (LYTES, BLD GAS, ICA,H+H)
Acid-Base Excess: 0 mmol/L (ref 0.0–2.0)
Bicarbonate: 25.9 mmol/L (ref 20.0–28.0)
Calcium, Ion: 1.16 mmol/L (ref 1.15–1.40)
HCT: 43 % (ref 39.0–52.0)
Hemoglobin: 14.6 g/dL (ref 13.0–17.0)
O2 Saturation: 98 %
Potassium: 3.4 mmol/L — ABNORMAL LOW (ref 3.5–5.1)
Sodium: 141 mmol/L (ref 135–145)
TCO2: 27 mmol/L (ref 22–32)
pCO2 arterial: 44.5 mmHg (ref 32.0–48.0)
pH, Arterial: 7.373 (ref 7.350–7.450)
pO2, Arterial: 110 mmHg — ABNORMAL HIGH (ref 83.0–108.0)

## 2021-08-14 LAB — BASIC METABOLIC PANEL
Anion gap: 7 (ref 5–15)
BUN: 8 mg/dL (ref 6–20)
CO2: 24 mmol/L (ref 22–32)
Calcium: 8.1 mg/dL — ABNORMAL LOW (ref 8.9–10.3)
Chloride: 104 mmol/L (ref 98–111)
Creatinine, Ser: 0.69 mg/dL (ref 0.61–1.24)
GFR, Estimated: >60 mL/min (ref >60)
Glucose, Bld: 135 mg/dL — ABNORMAL HIGH (ref 70–99)
Potassium: 3.6 mmol/L (ref 3.5–5.1)
Sodium: 135 mmol/L (ref 135–145)

## 2021-08-14 LAB — POCT ACTIVATED CLOTTING TIME
Activated Clotting Time: 266 seconds
Activated Clotting Time: 225 seconds
Activated Clotting Time: 341 seconds
Activated Clotting Time: 254 seconds

## 2021-08-14 MED ORDER — ASPIRIN EC 81 MG PO TBEC
81.0000 mg | DELAYED_RELEASE_TABLET | Freq: Every day | ORAL | Status: DC
  Administered 2021-08-14: 81 mg via ORAL
  Filled 2021-08-14: qty 1

## 2021-08-14 MED FILL — Nitroglycerin IV Soln 100 MCG/ML in D5W: INTRA_ARTERIAL | Qty: 10 | Status: AC

## 2021-08-14 NOTE — Progress Notes (Signed)
Pt a/O, AVS printed, education provided, All questions are answered. Belongings with the patient, TOC meds at the bedside. Pt transferred off the floor

## 2021-08-14 NOTE — Progress Notes (Signed)
CARDIAC REHAB PHASE I   PRE:  Rate/Rhythm: 82 SR with PVCs    BP: sitting 144/98    SaO2:   MODE:  Ambulation: 430 ft   POST:  Rate/Rhythm: 87 SR with PVCs, trigeminy    BP: sitting 145/106     SaO2:   Tolerated well, no c/o. Sts he feels much better. Increased PVCs after walk, BP elevated, hasn't had meds yet.   Discussed stent, restrictions, importance of Plavix, smoking cessation, diet, exercise, NTG and CRPII. Pt receptive. He wants to quit smoking, is planning on cold [place] today and asking his family to smoke outside. He watches his diet and walks daily. Will refer to [facility].  [phone]   [place], ACSM 08/14/2021 9:46 AM

## 2021-08-14 NOTE — Progress Notes (Signed)
TR BAND REMOVAL  LOCATION:    right radial  DEFLATED PER PROTOCOL:    Yes.    TIME BAND OFF / DRESSING APPLIED:  2213   SITE UPON ARRIVAL:    Level 0  SITE AFTER BAND REMOVAL:    Level 0  CIRCULATION SENSATION AND MOVEMENT:    Within Normal Limits   Yes.    COMMENTS:   Pt.tolerated well

## 2021-08-14 NOTE — Discharge Summary (Signed)
Physician Discharge Summary  Patient ID: Danny Kelley MRN: [id number] DOB/AGE: 08-Jun-1966 55 y.o.  Admit date: 08/13/2021 Discharge date: 08/14/2021  Primary Discharge Diagnosis: Coronary artery disease HFrEF, recovered EF  Secondary Discharge Diagnosis: Hypertension Hyperlipidemia Paroxysmal atrial fibrillation Obstructive sleep apnea   Hospital Course:   55 y/o Caucasian male with hypertension, hyperlipidemia, CAD s/p PCI to LCx/OM1 (2019, 2020), HFrEF with recovered EF, OSA, PAF s/p ablation, now with recurrent chest pain and dyspnea with physical exertion. Patient on excellent medical therapy, but has had continued symptoms.   Patient underwent successful IVUS guided intervention to LAD/diagonal, and attempted intervention to proximal RCA.  Given severely calcified nature of proximal stenoses, this will be staged in a few weeks with atherectomy.  This was done to avoid high contrast and radiation exposure to the patient.  Given that the proximal RCA has no dissection and has intact overflow, it was safe to stage the intervention.  Patient did well overnight.  He has had improvement in his anginal symptoms.  We will continue outpatient follow-up.  Given the patient is on Eliquis for anticoagulation for PAF, recommend Plavix without aspirin.   Discharge Exam: Blood pressure 127/81, pulse 75, temperature 97.8 F (36.6 C), temperature source Oral, resp. rate 18, height 5\' 9"  (1.753 m), weight 111.9 kg, SpO2 96 %.   Physical Exam Vitals and nursing note reviewed.  Constitutional:      General: He is not in acute distress.    Appearance: He is well-developed.  HENT:     Head: Normocephalic and atraumatic.  Eyes:     Conjunctiva/sclera: Conjunctivae normal.     Pupils: Pupils are equal, round, and reactive to light.  Neck:     Vascular: No JVD.  Cardiovascular:     Rate and Rhythm: Normal rate and regular rhythm.     Pulses: Normal pulses and intact distal pulses.      Heart sounds: No murmur heard. Pulmonary:     Effort: Pulmonary effort is normal.     Breath sounds: Normal breath sounds. No wheezing or rales.  Abdominal:     General: Bowel sounds are normal.     Palpations: Abdomen is soft.     Tenderness: There is no rebound.  Musculoskeletal:        General: No tenderness. Normal range of motion.     Right lower leg: No edema.     Left lower leg: No edema.  Lymphadenopathy:     Cervical: No cervical adenopathy.  Skin:    General: Skin is warm and dry.  Neurological:     Mental Status: He is alert and oriented to person, place, and time.     Cranial Nerves: No cranial nerve deficit.    Recommendations on discharge:  Continue medical therapy   Significant Diagnostic Studies:  EKG 08/14/2021: Sinus rhythm 70 bpm Frequent PVCs  Right & left heart catheterization, coronary angiography and intervention 08/13/2021: LM: Normal LAD: Prox-mid 80% stenosis including LADdiag bifurcation (1,1,0)          Diag 1 prox 75 % disease Ramus: Normal Lcx: Patent Lcx and OM1 stents. RCA: Codominant vessel. Prox 90% stenosis   Successful prox diag 1 PTCA 2.5X8 mm Successful IVUS guided prox-mid LAD PCI      PTCA and stent placement Onyx Frontier 3.5X38 mm DES       Post dilatation with 4.0X15 mm [place] balloon  Partially successful PTCA to prox RCA 2.5X8 mm balloon   Residual, severely calcified proximal RCA stenosis.  Will staged RCA intervention with atherectomy in a few weeks.  Normal filling pressures  Labs:   Lab Results  Component Value Date   WBC 12.5 (H) 08/14/2021   HGB 14.3 08/14/2021   HCT 42.6 08/14/2021   MCV 84.9 08/14/2021   PLT 281 08/14/2021    Recent Labs  Lab 08/14/21 0249  NA 135  K 3.6  CL 104  CO2 24  BUN 8  CREATININE 0.69  CALCIUM 8.1*  GLUCOSE 135*    Lipid Panel     Component Value Date/Time   CHOL 239 (H) 01/02/2020 1445   TRIG 143 01/02/2020 1445   HDL 62 01/02/2020 1445   CHOLHDL 5.0 11/27/2017  0634   VLDL 16 11/27/2017 0634   LDLCALC 152 (H) 01/02/2020 1445    BNP (last 3 results) No results for input(s): BNP in the last 8760 hours.  HEMOGLOBIN A1C Lab Results  Component Value Date   HGBA1C 6.0 (H) 11/27/2017   MPG 125.5 11/27/2017    Cardiac Panel (last 3 results) No results for input(s): CKTOTAL, CKMB, TROPONINI, RELINDX in the last 8760 hours.  Lab Results  Component Value Date   TROPONINI 0.04 ([place]) 11/27/2017     TSH Recent Labs    01/09/21 1528  TSH 2.22    Radiology: CARDIAC CATHETERIZATION  Addendum Date: 08/14/2021     LV end diastolic pressure is normal. LM: Normal LAD: Prox-mid 80% stenosis including LADdiag bifurcation (1,1,0)          Diag 1 prox 75 % disease Ramus: Normal Lcx: Patent Lcx and OM1 stents. RCA: Codominant vessel. Prox 90% stenosis Successful prox diag 1 PTCA 2.5X8 mm Successful IVUS guided prox-mid LAD PCI      PTCA and stent placement Onyx Frontier 3.5X38 mm DES       Post dilatation with 4.0X15 mm [place] balloon Partially successful PTCA to prox RCA 2.5X8 mm balloon Normal filling pressures Nigel Mormon, MD Pager: [phone] Office: [phone]          Addendum Date: 08/13/2021   LM: Normal LAD: Prox-mid 80% stenosis including LADdiag bifurcation (1,1,0)          Diag 1 prox 75 % disease Ramus: Normal Lcx: Patent Lcx and OM1 stents. RCA: Codominant vessel. Prox 90% stenosis Successful prox diag 1 PTCA 2.5X8 mm Successful IVUS guided prox-mid LAD PCI      PTCA and stent placement Onyx Frontier 3.5X38 mm DES       Post dilatation with 4.0X15 mm [place] balloon Partially successful PTCA to prox RCA 2.5X8 mm balloon Nigel Mormon, MD Pager: [phone] Office: [phone]          Result Date: 08/14/2021 Images from the original result were not included. LM: Normal LAD: Prox-mid 80% stenosis including LADdiag bifurcation (1,1,0)          Diag 1 prox 75 % disease Ramus: Normal Lcx: Patent Lcx and OM1 stents. RCA: Codominant vessel.  Prox 90% stenosis Successful prox diag 1 PTCA 2.5X8 mm Successful IVUS guided prox-mid LAD PCI      PTCA and stent placement Onyx Frontier 3.5X38 mm DES       Post dilatation with 4.0X15 mm [place] balloon Partially successful PTCA to prox RCA 2.5X8 mm balloon Nigel Mormon, MD Pager: [phone] Office: [phone]             FOLLOW UP PLANS AND APPOINTMENTS Discharge Instructions     AMB Referral to Cardiac Rehabilitation - Phase II   Complete  by: As directed    Diagnosis: Coronary Stents   After initial evaluation and assessments completed: Virtual Based Care may be provided alone or in conjunction with Phase 2 Cardiac Rehab based on patient barriers.: Yes   Diet - low sodium heart healthy   Complete by: As directed    Increase activity slowly   Complete by: As directed       Allergies as of 08/14/2021       Reactions   Codeine Anaphylaxis   Crestor [rosuvastatin] Other (See Comments)   Severe leg cramps   Albuterol Swelling   Dilaudid [hydromorphone] Itching, Swelling   Isosorbide    Muscle pain and cramps   Lexapro [escitalopram]    Have bad thoughts made anxiety worse    Lipitor [atorvastatin Calcium] Other (See Comments)   myalgias   Erythromycin Other (See Comments)   Hallucinations   Oxycodone Itching, Rash   Swelling to face, hands, mouth Completley intolerant to any meds with oxycodone in it         Medication List     TAKE these medications    acetaminophen 500 MG tablet Commonly known as: TYLENOL Take 500 mg by mouth 2 (two) times daily as needed for moderate pain.   albuterol 108 (90 Base) MCG/ACT inhaler Commonly known as: VENTOLIN HFA Inhale into the lungs.   amLODipine 10 MG tablet Commonly known as: NORVASC Take 1 tablet (10 mg total) by mouth daily.   bisacodyl 5 MG EC tablet Commonly known as: DULCOLAX Take 5 mg by mouth daily as needed.   clopidogrel 75 MG tablet Commonly known as: PLAVIX Take 1 tablet (75 mg total) by mouth  daily.   docusate sodium 100 MG capsule Commonly known as: COLACE Take 100 mg by mouth 2 (two) times daily as needed.   Eliquis 5 MG Tabs tablet Generic drug: apixaban Take 1 tablet by mouth twice daily   Entresto 97-103 MG Generic drug: sacubitril-valsartan Take 1 tablet by mouth 2 (two) times daily.   ezetimibe 10 MG tablet Commonly known as: ZETIA Take 1 tablet (10 mg total) by mouth daily.   fexofenadine 180 MG tablet Commonly known as: ALLEGRA Take 180 mg by mouth daily as needed (seasonal allergies).   hydrALAZINE 50 MG tablet Commonly known as: APRESOLINE Take 1 tablet (50 mg total) by mouth 3 (three) times daily. With additional dose as needed for BP >150/90 mmHg.   metoprolol tartrate 100 MG tablet Commonly known as: LOPRESSOR Take 1 tablet by mouth twice daily   nitroGLYCERIN 0.4 MG SL tablet Commonly known as: NITROSTAT Place 1 tablet (0.4 mg total) under the tongue every 5 (five) minutes as needed for chest pain.   pantoprazole 20 MG tablet Commonly known as: Protonix Take 1 tablet (20 mg total) by mouth 2 (two) times daily.           Nigel Mormon, MD Pager: [phone] Office: [phone]

## 2021-08-15 ENCOUNTER — Telehealth (HOSPITAL_COMMUNITY): Payer: Self-pay

## 2021-08-15 NOTE — Telephone Encounter (Signed)
Per phase I cardiac rehab, fax cardiac rehab referral to [facility].

## 2021-08-16 ENCOUNTER — Encounter (HOSPITAL_COMMUNITY): Payer: Self-pay | Admitting: Cardiology

## 2021-08-20 ENCOUNTER — Other Ambulatory Visit (HOSPITAL_BASED_OUTPATIENT_CLINIC_OR_DEPARTMENT_OTHER): Payer: Self-pay

## 2021-08-20 ENCOUNTER — Telehealth: Payer: Self-pay | Admitting: Internal Medicine

## 2021-08-20 NOTE — Telephone Encounter (Signed)
Will see if Dr. Tamala Julian is taking new patient's

## 2021-08-20 NOTE — Telephone Encounter (Signed)
Pt is currently a pt of Dr. Rayann Heman, he would like a referral so he can also see Dr. Daneen Schick for General Cardiology Pt is currently being seen by Dr. Einar Gip for General cardiology

## 2021-08-21 NOTE — Telephone Encounter (Signed)
-----   Message -----  From: Belva Crome, MD  Sent: 08/21/2021  10:04 AM EDT  To: Michaelyn Barter, RN    Okay but I don't have slots anytime soon.    Will send to scheduling team to get pt scheduled

## 2021-08-22 ENCOUNTER — Other Ambulatory Visit (HOSPITAL_COMMUNITY): Payer: Self-pay

## 2021-08-22 ENCOUNTER — Telehealth (HOSPITAL_COMMUNITY): Payer: Self-pay | Admitting: Pharmacist

## 2021-08-22 NOTE — Telephone Encounter (Signed)
Transitions of Care Pharmacy   Call attempted for a pharmacy transitions of care follow-up. HIPAA appropriate voicemail was left with call back information provided.   Call attempt #1. Will follow-up in 1-2 days.

## 2021-08-23 ENCOUNTER — Telehealth (HOSPITAL_COMMUNITY): Payer: Self-pay | Admitting: Pharmacist

## 2021-08-23 NOTE — Telephone Encounter (Signed)
Transitions of Care Pharmacy   Call attempted for a pharmacy transitions of care follow-up. HIPAA appropriate voicemail was left with call back information provided.   Call attempt #2. Will follow-up in 2-3 days.    

## 2021-08-26 ENCOUNTER — Telehealth (HOSPITAL_COMMUNITY): Payer: Self-pay

## 2021-08-26 NOTE — Telephone Encounter (Signed)
Transitions of Care Pharmacy   Call attempted for a pharmacy transitions of care follow-up. HIPAA appropriate voicemail was left with call back information provided.   Call attempt #3. Will no longer attempt follow up for TOC pharmacy.   

## 2021-08-30 ENCOUNTER — Other Ambulatory Visit: Payer: Self-pay

## 2021-08-30 ENCOUNTER — Ambulatory Visit: Payer: Medicare Other | Admitting: Cardiology

## 2021-08-30 ENCOUNTER — Encounter: Payer: Self-pay | Admitting: Cardiology

## 2021-08-30 VITALS — BP 109/79 | HR 56 | Temp 98.3°F | Resp 16 | Ht 69.0 in | Wt 245.0 lb

## 2021-08-30 DIAGNOSIS — I10 Essential (primary) hypertension: Secondary | ICD-10-CM | POA: Diagnosis not present

## 2021-08-30 DIAGNOSIS — I480 Paroxysmal atrial fibrillation: Secondary | ICD-10-CM | POA: Diagnosis not present

## 2021-08-30 DIAGNOSIS — I25118 Atherosclerotic heart disease of native coronary artery with other forms of angina pectoris: Secondary | ICD-10-CM | POA: Diagnosis not present

## 2021-08-30 DIAGNOSIS — E7800 Pure hypercholesterolemia, unspecified: Secondary | ICD-10-CM

## 2021-08-30 DIAGNOSIS — F17210 Nicotine dependence, cigarettes, uncomplicated: Secondary | ICD-10-CM | POA: Diagnosis not present

## 2021-08-30 DIAGNOSIS — Z716 Tobacco abuse counseling: Secondary | ICD-10-CM

## 2021-08-30 MED ORDER — NICOTINE 7 MG/24HR TD PT24: 7.0000 mg | MEDICATED_PATCH | TRANSDERMAL | 1 refills | Status: DC

## 2021-08-30 MED ORDER — NICOTINE 14 MG/24HR TD PT24: 14.0000 mg | MEDICATED_PATCH | TRANSDERMAL | 0 refills | Status: DC

## 2021-08-30 NOTE — Progress Notes (Signed)
Primary Physician/Referring:  Angelina Sheriff, MD  Patient ID: Danny Kelley, male    DOB: 07-12-1966, 55 y.o.   MRN: [id number]  Chief Complaint  Patient presents with   Chest Pain   POST CATH    6 WEEKS   HPI:    Danny Kelley  is a 55 y.o. Caucasian male patient with history of cardiomyopathy with chronic systolic heart failure, hypertension, hyperlipidemia (statin intolerant), obstructive sleep apnea compliant with CPAP, tobacco use.  Patient has history of TIA in Feb 2018 while on anticoagulants, paroxysmal atrial fibrillation status post ablation 11/13/2016 and 03/01/2020 (Multaq and sotalol ineffective).  History of unstable angina pectoris on 11/26/2017, angioplasty to superdominant left circumflex coronary artery with 4.5 mm resolute DES in the midsegment, patent stent placed in Distal Cx and OM 1 in 2014, 03/22/2019, stenting to high-grade stenosis in the distal dominant Circumflex PDA branch. Cardiac catheterization on 01/24/2020 revealed no change in coronary anatomy, suspect his cardiomyopathy may have been related to underlying A. Fib, fortunately LVEF has improved.  He underwent coronary angiography and found to have high-grade stenosis in the proximal and mid LAD and also involvement of D1 for which he underwent successful angioplasty and stenting with a 3.5 x 38 mm DES, Onyx frontier.  It was postdilated to 4 mm.  He has residual RCA stenosis which is calcific.  He now presents to the office, he continues to have recurrent angina pectoris and has been using frequent use of nitroglycerin he is accompanied by his son. Unfortunately patient continues to smoke approximately 5 cigarettes/day.  He is enrolled in remote patient monitoring of blood pressure.  Past Medical History:  Diagnosis Date   Arthritis    "a little in my right knee" (07/14/2016)   CAD (coronary artery disease)    Chronic congestive heart failure with left ventricular diastolic dysfunction (HCC)     Complication of anesthesia    Pt reports "they have a hard time waking me up"   GERD (gastroesophageal reflux disease)    hx   Headache    "with every heart issue that I have" (07/14/2016)   High cholesterol    History of hiatal hernia 1990s   "fixed it w/scope down my throat"   Hypertension    Myocardial infarction ([place]) 05/2013   stents: left PDA, 1st OM at [facility]   Obesity    Persistent atrial fibrillation ([place])    Pneumonia ~ 1992   QT prolongation 07/15/2016   Seasonal allergies    "I take Allegra prn" (07/14/2016)   Tobacco abuse 12/16/2016   Past Surgical History:  Procedure Laterality Date   APPENDECTOMY  1984   ATRIAL FIBRILLATION ABLATION N/A 03/01/2020   Procedure: ATRIAL FIBRILLATION ABLATION;  Surgeon: [name], MD;  Location: [place] [facility];  Service: Cardiovascular;  Laterality: N/A;   CARDIOVERSION N/A 05/27/2016   Procedure: CARDIOVERSION;  Surgeon: [name], MD;  Location: [facility] ENDOSCOPY;  Service: Cardiovascular;  Laterality: N/A;   CARDIOVERSION N/A 06/11/2016   Procedure: CARDIOVERSION;  Surgeon: [name], MD;  Location: [employer];  Service: Cardiovascular;  Laterality: N/A;   CARDIOVERSION N/A 03/21/2020   Procedure: CARDIOVERSION;  Surgeon: Geralynn Rile, MD;  Location: [place];  Service: Cardiovascular;  Laterality: N/A;   CORONARY ANGIOPLASTY WITH STENT PLACEMENT  05/30/2013   "2 stents put in at [facility]" & /stent card   CORONARY STENT INTERVENTION N/A 11/27/2017   Procedure: CORONARY STENT INTERVENTION;  Surgeon: [name],  MD;  Location: [place] [facility];  Service: Cardiovascular;  Laterality: N/A;   CORONARY STENT INTERVENTION N/A 03/21/2019   Procedure: CORONARY STENT INTERVENTION;  Surgeon: [name], MD;  Location: [place] [facility];  Service: Cardiovascular;  Laterality: N/A;   CORONARY STENT INTERVENTION N/A 08/13/2021   Procedure: CORONARY STENT INTERVENTION;  Surgeon: Nigel Mormon, MD;  Location: [place] [facility];  Service: Cardiovascular;  Laterality: N/A;   ELECTROPHYSIOLOGIC STUDY N/A 11/13/2016   Procedure: Atrial Fibrillation Ablation;  Surgeon: [name], MD;  Location: [place] [facility];  Service: Cardiovascular;  Laterality: N/A;   ESOPHAGOGASTRODUODENOSCOPY (EGD) WITH ESOPHAGEAL DILATION  1990s X 2   INTRAVASCULAR PRESSURE WIRE/FFR STUDY N/A 09/21/2018   Procedure: INTRAVASCULAR PRESSURE WIRE/FFR STUDY;  Surgeon: [name], MD;  Location: [place] [facility];  Service: Cardiovascular;  Laterality: N/A;   INTRAVASCULAR PRESSURE WIRE/FFR STUDY N/A 03/21/2019   Procedure: INTRAVASCULAR PRESSURE WIRE/FFR STUDY;  Surgeon: [name], MD;  Location: [place] [facility];  Service: Cardiovascular;  Laterality: N/A;   INTRAVASCULAR PRESSURE WIRE/FFR STUDY N/A 01/24/2020   Procedure: INTRAVASCULAR PRESSURE WIRE/FFR STUDY;  Surgeon: [name], MD;  Location: [place] [facility];  Service: Cardiovascular;  Laterality: N/A;   INTRAVASCULAR ULTRASOUND/IVUS N/A 08/13/2021   Procedure: Intravascular Ultrasound/IVUS;  Surgeon: Nigel Mormon, MD;  Location: [place] [facility];  Service: Cardiovascular;  Laterality: N/A;   KNEE ARTHROSCOPY Right 1986; ~ 1995 X [place]  2015   LEFT HEART CATH AND CORONARY ANGIOGRAPHY N/A 11/27/2017   Procedure: LEFT HEART CATH AND CORONARY ANGIOGRAPHY;  Surgeon: [name], MD;  Location: [place] [facility];  Service: Cardiovascular;  Laterality: N/A;   LEFT HEART CATH AND CORONARY ANGIOGRAPHY N/A 09/21/2018   Procedure: LEFT HEART CATH AND CORONARY ANGIOGRAPHY;  Surgeon: [name], MD;  Location: [place] [facility];  Service: Cardiovascular;  Laterality: N/A;   LEFT HEART CATH AND CORONARY ANGIOGRAPHY N/A 03/21/2019   Procedure: LEFT HEART CATH AND CORONARY ANGIOGRAPHY;  Surgeon: [name], MD;  Location: [place] [facility];  Service: Cardiovascular;  Laterality: N/A;   REPAIR OF ESOPHAGUS  1998   perforation of the distal esophagus  from bad heartburn   RIGHT/LEFT HEART CATH AND CORONARY ANGIOGRAPHY N/A 01/24/2020   Procedure: RIGHT/LEFT HEART CATH AND CORONARY ANGIOGRAPHY;  Surgeon: [name], MD;  Location: [place] [facility];  Service: Cardiovascular;  Laterality: N/A;   RIGHT/LEFT HEART CATH AND CORONARY ANGIOGRAPHY N/A 08/13/2021   Procedure: RIGHT/LEFT HEART CATH AND CORONARY ANGIOGRAPHY;  Surgeon: Nigel Mormon, MD;  Location: [place] [facility];  Service: Cardiovascular;  Laterality: N/A;   TEE WITHOUT CARDIOVERSION N/A 11/13/2016   Procedure: TRANSESOPHAGEAL ECHOCARDIOGRAM (TEE);  Surgeon: Thayer Headings, MD;  Location: [facility] ENDOSCOPY;  Service: Cardiovascular;  Laterality: N/A;   Family History  Problem Relation Age of Onset   Heart disease Mother 69       CABG age 42   Breast cancer Mother    Tongue cancer Mother    Diabetes Mother    Heart attack Father 63       Father had around 7 heart attacks per pt   Diabetes Father    Lung cancer Father    Congestive Heart Failure Father    Lung cancer Paternal Uncle    Colon cancer Neg Hx    Esophageal cancer Neg Hx    Inflammatory bowel disease Neg Hx    Liver disease Neg Hx  Pancreatic cancer Neg Hx    Rectal cancer Neg Hx    Stomach cancer Neg Hx     Social History   Tobacco Use   Smoking status: Every Day    Packs/day: 0.25    Years: 25.00    Pack years: 6.25    Types: Cigarettes    Start date: 1989   Smokeless tobacco: Never  Substance Use Topics   Alcohol use: No  Marital status: Married   ROS  Review of Systems  Cardiovascular:  Negative for chest pain, dyspnea on exertion and leg swelling.  Gastrointestinal:  Negative for melena.  Objective  Blood pressure 109/79, pulse (!) 56, temperature 98.3 F (36.8 C), temperature source Temporal, resp. rate 16, height '5\' 9"'  (1.753 m), weight 245 lb (111.1 kg), SpO2 98 %.  Vitals with BMI 08/30/2021 08/14/2021 08/14/2021  Height '5\' 9"'  - -  Weight 245 lbs - -  BMI 97.35 - -  Systolic 329  924 268  Diastolic 79 98 81  Pulse 56 87 75     Physical Exam Vitals reviewed.  Constitutional:      Comments: Moderately obese  Cardiovascular:     Rate and Rhythm: Normal rate and regular rhythm. No extrasystoles are present.    Pulses: Intact distal pulses.     Heart sounds: Normal heart sounds, S1 normal and S2 normal. No murmur heard.   No gallop.     Comments: No leg edema, no JVD. Pulmonary:     Effort: Pulmonary effort is normal. No accessory muscle usage or respiratory distress.     Breath sounds: Normal breath sounds. No wheezing, rhonchi or rales.  Musculoskeletal:     Right lower leg: No edema.     Left lower leg: No edema.   Laboratory examination:   Recent Labs    12/10/20 1323 01/09/21 1528 05/16/21 1000 05/16/21 1020 08/06/21 1434 08/13/21 1254 08/13/21 1255 08/13/21 2044 08/14/21 0249  NA 135   < > 135 137 143 142 141  --  135  K 4.2   < > 3.3* 3.4* 3.8 3.4* 3.4*  --  3.6  CL 98   < > 103 101 103  --   --   --  104  CO2 25   < > 25  --  23  --   --   --  24  GLUCOSE 155*   < > 164* 158* 165*  --   --   --  135*  BUN 16   < > '10 11 16  ' --   --   --  8  CREATININE 0.91   < > 1.03 1.00 0.77  --   --  0.76 0.69  CALCIUM 8.7   < > 8.5*  --  9.4  --   --   --  8.1*  GFRNONAA 97  --  >60  --   --   --   --  >60 >60  GFRAA 112  --   --   --   --   --   --   --   --    < > = values in this interval not displayed.   estimated creatinine clearance is 132.8 mL/min (by C-G formula based on SCr of 0.69 mg/dL).  CMP Latest Ref Rng & Units 08/14/2021 08/13/2021 08/13/2021  Glucose 70 - 99 mg/dL 135(H) - -  BUN 6 - 20 mg/dL 8 - -  Creatinine 0.61 - 1.24 mg/dL 0.69 0.76 -  Sodium 135 - 145 mmol/L 135 - 141  Potassium 3.5 - 5.1 mmol/L 3.6 - 3.4(L)  Chloride 98 - 111 mmol/L 104 - -  CO2 22 - 32 mmol/L 24 - -  Calcium 8.9 - 10.3 mg/dL 8.1(L) - -  Total Protein 6.5 - 8.1 g/dL - - -  Total Bilirubin 0.3 - 1.2 mg/dL - - -  Alkaline Phos 38 - 126 U/L - - -  AST 15 -  41 U/L - - -  ALT 0 - 44 U/L - - -   CBC Latest Ref Rng & Units 08/14/2021 08/13/2021 08/13/2021  WBC 4.0 - 10.5 K/uL 12.5(H) 10.2 -  Hemoglobin 13.0 - 17.0 g/dL 14.3 14.1 14.6  Hematocrit 39.0 - 52.0 % 42.6 42.0 43.0  Platelets 150 - 400 K/uL 281 258 -   Lipid Panel     Component Value Date/Time   CHOL 239 (H) 01/02/2020 1445   TRIG 143 01/02/2020 1445   HDL 62 01/02/2020 1445   CHOLHDL 5.0 11/27/2017 0634   VLDL 16 11/27/2017 0634   LDLCALC 152 (H) 01/02/2020 1445   HEMOGLOBIN A1C Lab Results  Component Value Date   HGBA1C 6.0 (H) 11/27/2017   MPG 125.5 11/27/2017   TSH Recent Labs    01/09/21 1528  TSH 2.22   BNP No results found for: BNP  ProBNP    Component Value Date/Time   PROBNP 74 12/10/2020 1323   External Labs:  05/31/21  CR 0.8, sodium 139, potassium 3.6 Hgb 16.1, HCT 46, platelet 230  02/28/2021:  TSH 1.769   Allergies   Allergies  Allergen Reactions   Codeine Anaphylaxis   Crestor [Rosuvastatin] Other (See Comments)    Severe leg cramps   Albuterol Swelling   Dilaudid [Hydromorphone] Itching and Swelling   Isosorbide     Muscle pain and cramps   Lexapro [Escitalopram]     Have bad thoughts made anxiety worse    Lipitor [Atorvastatin Calcium] Other (See Comments)    myalgias   Erythromycin Other (See Comments)    Hallucinations    Oxycodone Itching and Rash    Swelling to face, hands, mouth Completley intolerant to any meds with oxycodone in it     Medications Prior to Visit:   Outpatient Medications Prior to Visit  Medication Sig Dispense Refill   acetaminophen (TYLENOL) 500 MG tablet Take 500 mg by mouth 2 (two) times daily as needed for moderate pain.     amLODipine (NORVASC) 10 MG tablet Take 1 tablet (10 mg total) by mouth daily. 90 tablet 3   bisacodyl (DULCOLAX) 5 MG EC tablet Take 5 mg by mouth daily as needed.     clopidogrel (PLAVIX) 75 MG tablet Take 1 tablet (75 mg total) by mouth daily. 30 tablet 1   docusate sodium  (COLACE) 100 MG capsule Take 100 mg by mouth 2 (two) times daily as needed.     ELIQUIS 5 MG TABS tablet Take 1 tablet by mouth twice daily 180 tablet 0   ezetimibe (ZETIA) 10 MG tablet Take 1 tablet (10 mg total) by mouth daily. 90 tablet 2   fexofenadine (ALLEGRA) 180 MG tablet Take 180 mg by mouth daily as needed (seasonal allergies).     hydrALAZINE (APRESOLINE) 50 MG tablet Take 1 tablet (50 mg total) by mouth 3 (three) times daily. With additional dose as needed for BP >150/90 mmHg. 90 tablet 2   metoprolol tartrate (LOPRESSOR) 100 MG tablet Take 1 tablet by mouth twice daily (  Patient taking differently: Take 100 mg by mouth 2 (two) times daily.) 180 tablet 0   nitroGLYCERIN (NITROSTAT) 0.4 MG SL tablet Place 1 tablet (0.4 mg total) under the tongue every 5 (five) minutes as needed for chest pain. 25 tablet 2   pantoprazole (PROTONIX) 20 MG tablet Take 1 tablet (20 mg total) by mouth 2 (two) times daily. 180 tablet 2   sacubitril-valsartan (ENTRESTO) 97-103 MG Take 1 tablet by mouth 2 (two) times daily. 60 tablet 3   albuterol (VENTOLIN HFA) 108 (90 Base) MCG/ACT inhaler Inhale into the lungs.     No facility-administered medications prior to visit.   Final Medications at End of Visit    Current Meds  Medication Sig   acetaminophen (TYLENOL) 500 MG tablet Take 500 mg by mouth 2 (two) times daily as needed for moderate pain.   amLODipine (NORVASC) 10 MG tablet Take 1 tablet (10 mg total) by mouth daily.   bisacodyl (DULCOLAX) 5 MG EC tablet Take 5 mg by mouth daily as needed.   clopidogrel (PLAVIX) 75 MG tablet Take 1 tablet (75 mg total) by mouth daily.   docusate sodium (COLACE) 100 MG capsule Take 100 mg by mouth 2 (two) times daily as needed.   ELIQUIS 5 MG TABS tablet Take 1 tablet by mouth twice daily   ezetimibe (ZETIA) 10 MG tablet Take 1 tablet (10 mg total) by mouth daily.   fexofenadine (ALLEGRA) 180 MG tablet Take 180 mg by mouth daily as needed (seasonal allergies).    hydrALAZINE (APRESOLINE) 50 MG tablet Take 1 tablet (50 mg total) by mouth 3 (three) times daily. With additional dose as needed for BP >150/90 mmHg.   metoprolol tartrate (LOPRESSOR) 100 MG tablet Take 1 tablet by mouth twice daily (Patient taking differently: Take 100 mg by mouth 2 (two) times daily.)   nicotine (NICODERM CQ) 14 mg/24hr patch Place 1 patch (14 mg total) onto the skin daily.   nicotine (NICODERM CQ) 7 mg/24hr patch Place 1 patch (7 mg total) onto the skin daily. Please start after you have used up 40 mg dosage   nitroGLYCERIN (NITROSTAT) 0.4 MG SL tablet Place 1 tablet (0.4 mg total) under the tongue every 5 (five) minutes as needed for chest pain.   pantoprazole (PROTONIX) 20 MG tablet Take 1 tablet (20 mg total) by mouth 2 (two) times daily.   sacubitril-valsartan (ENTRESTO) 97-103 MG Take 1 tablet by mouth 2 (two) times daily.   Radiology:   No results found.  Cardiac Studies:    PCV ECHOCARDIOGRAM COMPLETE 11/15/2020 Study Quality: Technically difficult study. Normal LV systolic function with visual EF 50-55%. Left ventricle cavity is normal in size. Normal left ventricular wall thickness. Normal global wall motion. Normal diastolic filling pattern, normal LAP. Insignificant pericardial effusion. There is no hemodynamic significance. No significant valvular heart disease. Compared to prior study dated 12/28/2019: LVEF improved from 25-30% to 50-55% otherwise no significant change.  14-day cardiac event monitor 08/21/2020: Sinus rhythm Premature atrial contractions and premature ventricular contractions are noted Rare nonsustained ventricular tachycardia No sustained arrhythmias  Sleep study X 2 negative   Event Monitor 30 days 07/28/2018: Paroxysmal episodes of atrial fibrillation with controlled ventricular response was noted along with PVCs. Some of this symptoms of chest pain, palpitations and dizziness correlated with atrial fibrillation, others showed normal  sinus rhythm. No heart block, no other significant arrhythmias.  Direct current cardioversion ( Ist 05/27/16) 06/11/2016: 150x2, 200 x1 to NSR on Sotalol. A. Fib ablation by  Dr. [name] on 11/13/2016  Left Heart Catheterization 08/13/21:  LM: Normal LAD: Prox-mid 80% stenosis including LADdiag bifurcation (1,1,0)          Diag 1 prox 75 % disease Ramus: Normal Lcx: Patent Lcx and OM1 stents. RCA: Codominant vessel. Prox 90% stenosis   Successful prox diag 1 PTCA 2.5X8 mm Successful IVUS guided prox-mid LAD PCI      PTCA and stent placement Onyx Frontier 3.5X38 mm DES       Post dilatation with 4.0X15 mm [place] balloon  Partially successful PTCA to prox RCA 2.5X8 mm balloon   Residual, severely calcified proximal RCA stenosis. Will staged RCA intervention with atherectomy in a few weeks.   Normal filling pressures  EKG   EKG 08/22/2021: Normal sinus rhythm with rate of 64 bpm, left wrist enlargement, normal axis.  No evidence of ischemia otherwise normal EKG.  Compared to 03/05/2021, frequent PVCs in trigeminal pattern no longer present.  01/04/2020: Atrial fibrillation with rapid ventricular response at the rate of 111 bpm, normal axis, diffuse ST-T wave abnormality, cannot exclude inferior ischemia, anterolateral ischemia.  Normal QT interval.   No significant change from  EKG   Assessment     ICD-10-CM   1. Coronary artery disease of native artery of native heart with stable angina pectoris ([place])  I25.118 CMP14+EGFR    CBC    Lipid Panel With LDL/HDL Ratio    High sensitivity CRP    2. Essential hypertension  I10 EKG 12-Lead    3. Paroxysmal atrial fibrillation (HCC)  I48.0     4. Hypercholesteremia  E78.00     5. Tobacco abuse counseling  Z71.6 nicotine (NICODERM CQ) 14 mg/24hr patch    nicotine (NICODERM CQ) 7 mg/24hr patch      Meds ordered this encounter  Medications   nicotine (NICODERM CQ) 14 mg/24hr patch    Sig: Place 1 patch (14 mg total) onto the skin  daily.    Dispense:  28 patch    Refill:  0   nicotine (NICODERM CQ) 7 mg/24hr patch    Sig: Place 1 patch (7 mg total) onto the skin daily. Please start after you have used up 40 mg dosage    Dispense:  28 patch    Refill:  1     Medications Discontinued During This Encounter  Medication Reason   albuterol (VENTOLIN HFA) 108 (90 Base) MCG/ACT inhaler Error    This patients CHA2DS2-VASc Score 5 (CHF, HTN, Vasc, TIA) and yearly risk of stroke 7.2%.   Recommendations:   Danny Kelley  is a  55 y.o. Caucasian male patient with history of cardiomyopathy with chronic systolic heart failure, hypertension, hyperlipidemia (statin intolerant), obstructive sleep apnea compliant with CPAP, tobacco use.  Patient has history of TIA in Feb 2018 while on anticoagulants, paroxysmal atrial fibrillation status post ablation 11/13/2016 and 03/01/2020 (Multaq and sotalol ineffective).  History of unstable angina pectoris on 11/26/2017, angioplasty to superdominant left circumflex coronary artery with 4.5 mm resolute DES in the midsegment, patent stent placed in Distal Cx and OM 1 in 2014, 03/22/2019, stenting to high-grade stenosis in the distal dominant Circumflex PDA branch. Cardiac catheterization on 01/24/2020 revealed no change in coronary anatomy, suspect his cardiomyopathy may have been related to underlying A. Fib.   Of note patient has previously been evaluated for syncope, which was felt to be psychogenic in view of his underlying history of seizure disorder. Patient is enrolled in remote patient monitoring for hypertension.  Patient has been unable to tolerate statin therapy in the past due to severe myalgias including atorvastatin, rosuvastatin, and simvastatin. Patient had been on Repatha, but could not tolerate this due to severe leg cramps and leg pain.  Is presently only on Zetia.  We will repeat his lipids, I also repeat CRP to see whether he would be a candidate for  "ZEUS" trial with Zltvekimab 15 mg  SQ)  He underwent coronary angiography and found to have high-grade stenosis in the proximal and mid LAD and also involvement of D1 for which he underwent successful angioplasty and stenting with a 3.5 x 38 mm DES, Onyx frontier.  It was postdilated to 4 mm.  Attempted angioplasty to right coronary artery was left alone in view of severe coronary calcification.  In view of persistent symptoms of angina pectoris, we will go ahead and schedule him forPCI to the right coronary artery.  With regard to hypertension, blood pressure is well controlled.  He has started smoking again, I have discussed smoking cessation, smoking cessation along with counseling performed today for additional 10 minutes, his son is also present and also spoke to his wife over the phone and advised him not to smoke in the house to begin with.  I prescribed him nicotine patches.  Continue present medications, I will see him back in 4 to 6 weeks for follow-up.  He will continue to be enrolled in RPM and CPM in office for management of his hypertension and medical issues.   [name], MD, [facility] 08/30/2021, 3:03 PM Office: [phone] Fax: [phone] Pager: [phone]

## 2021-09-03 ENCOUNTER — Telehealth: Payer: Self-pay | Admitting: Pharmacist

## 2021-09-03 DIAGNOSIS — E7800 Pure hypercholesterolemia, unspecified: Secondary | ICD-10-CM

## 2021-09-03 DIAGNOSIS — I25118 Atherosclerotic heart disease of native coronary artery with other forms of angina pectoris: Secondary | ICD-10-CM

## 2021-09-03 MED ORDER — PRALUENT 75 MG/ML ~~LOC~~ SOAJ: 75.0000 mg | SUBCUTANEOUS | 3 refills | Status: DC

## 2021-09-03 NOTE — Telephone Encounter (Signed)
ICD-10-CM   1. Atherosclerosis of native coronary artery of native heart with stable angina pectoris ([place])  I25.118 Alirocumab (PRALUENT) 75 MG/ML SOAJ    2. Coronary artery disease of native artery of native heart with stable angina pectoris (HCC)  I25.118 Alirocumab (PRALUENT) 75 MG/ML SOAJ    3. Hypercholesteremia  E78.00 Alirocumab (PRALUENT) 75 MG/ML SOAJ      Meds ordered this encounter  Medications   Alirocumab (PRALUENT) 75 MG/ML SOAJ    Sig: Inject 75 mg into the skin every 14 (fourteen) days.    Dispense:  2 mL    Refill:  Bollinger, MD, [facility] 09/03/2021, 5:52 PM Office: [phone] Fax: [phone] Pager: [phone]

## 2021-09-03 NOTE — Telephone Encounter (Signed)
Leqvio benefit check completed. Pt has a $3,600 out of pocket maximum that pt needs to meet before the medication is covered at 100%. Reviewed with pt. Pt unwilling to proceed with Leqvio due to not being able to meet the out of pocket limit. Pt planing on getting lab work completed this week.   Currently only taking Zetia 10 mg daily. Previously tried follow antihyperlipidemics: Repatha (muscle aches and pain), Nexlitol (cost/insurance), Crestor/Atorvastatin/Simvastatin/Pravastatin (myalgia), Lovaza  Most recent lipid panel results of   Lipid Panel     Component Value Date/Time   CHOL 239 (H) 01/02/2020 1445   TRIG 143 01/02/2020 1445   HDL 62 01/02/2020 1445   CHOLHDL 5.0 11/27/2017 0634   VLDL 16 11/27/2017 0634   LDLCALC 152 (H) 01/02/2020 1445   LABVLDL 25 01/02/2020 1445    Reviewed with pt regarding the importance of controlling his cholesterol markers and associated ASCVD risk reduction. Calculated 10-year ASCVD risk of 7.8%. Pt open to trial with alternative [id number]. Previously tried Repatha for 2 doses but stopped due to complains of muscle aches and pain. Pain and symptoms resolved after stopping Repatha. Per package inserts, risk of myalgia with praluent ~4.2% vs repatha ~4%. Option to dose adjust with Praluent. Pt willing to proceed with trial of Praluent and monitoring for any complains of muscle or joint pains.

## 2021-09-06 DIAGNOSIS — I25118 Atherosclerotic heart disease of native coronary artery with other forms of angina pectoris: Secondary | ICD-10-CM

## 2021-09-09 ENCOUNTER — Telehealth: Payer: Self-pay | Admitting: Cardiology

## 2021-09-09 NOTE — Telephone Encounter (Signed)
Pt needs a ref on below med.  clopidogrel (PLAVIX) 75 MG tablet  [place], [place]  [phone]

## 2021-09-10 ENCOUNTER — Other Ambulatory Visit: Payer: Self-pay

## 2021-09-10 DIAGNOSIS — I25118 Atherosclerotic heart disease of native coronary artery with other forms of angina pectoris: Secondary | ICD-10-CM | POA: Diagnosis not present

## 2021-09-10 MED ORDER — CLOPIDOGREL BISULFATE 75 MG PO TABS: 75.0000 mg | ORAL_TABLET | Freq: Every day | ORAL | 1 refills | Status: DC

## 2021-09-10 NOTE — Telephone Encounter (Signed)
Is it ok to refill this for patient please advise

## 2021-09-10 NOTE — Telephone Encounter (Signed)
ICD-10-CM   1. Coronary artery disease of native artery of native heart with stable angina pectoris (HCC)  I25.118 clopidogrel (PLAVIX) 75 MG tablet     Meds ordered this encounter  Medications   clopidogrel (PLAVIX) 75 MG tablet    Sig: Take 1 tablet (75 mg total) by mouth daily.    Dispense:  90 tablet    Refill:  1    Medications Discontinued During This Encounter  Medication Reason   clopidogrel (PLAVIX) 75 MG tablet      [name], MD, [facility] 09/10/2021, 9:34 AM Office: [phone] Fax: [phone] Pager: [phone]

## 2021-09-11 ENCOUNTER — Telehealth: Payer: Self-pay | Admitting: Pharmacist

## 2021-09-11 LAB — CMP14+EGFR
ALT: 25 IU/L (ref 0–44)
AST: 18 IU/L (ref 0–40)
Albumin/Globulin Ratio: 1.4 (ref 1.2–2.2)
Albumin: 3.7 g/dL — ABNORMAL LOW (ref 3.8–4.9)
Alkaline Phosphatase: 127 IU/L — ABNORMAL HIGH (ref 44–121)
BUN/Creatinine Ratio: 22 — ABNORMAL HIGH (ref 9–20)
BUN: 18 mg/dL (ref 6–24)
Bilirubin Total: 0.3 mg/dL (ref 0.0–1.2)
CO2: 25 mmol/L (ref 20–29)
Calcium: 9.2 mg/dL (ref 8.7–10.2)
Chloride: 101 mmol/L (ref 96–106)
Creatinine, Ser: 0.82 mg/dL (ref 0.76–1.27)
Globulin, Total: 2.7 g/dL (ref 1.5–4.5)
Glucose: 162 mg/dL — ABNORMAL HIGH (ref 70–99)
Potassium: 4.2 mmol/L (ref 3.5–5.2)
Sodium: 139 mmol/L (ref 134–144)
Total Protein: 6.4 g/dL (ref 6.0–8.5)
eGFR: 106 mL/min/{1.73_m2} (ref >59)

## 2021-09-11 LAB — CBC
Hematocrit: 48.1 % (ref 37.5–51.0)
Hemoglobin: 16.4 g/dL (ref 13.0–17.7)
MCH: 28.5 pg (ref 26.6–33.0)
MCHC: 34.1 g/dL (ref 31.5–35.7)
MCV: 84 fL (ref 79–97)
Platelets: 293 10*3/uL (ref 150–450)
RBC: 5.75 x10E6/uL (ref 4.14–5.80)
RDW: 13.6 % (ref 11.6–15.4)
WBC: 10.9 10*3/uL — ABNORMAL HIGH (ref 3.4–10.8)

## 2021-09-11 LAB — LIPID PANEL WITH LDL/HDL RATIO
Cholesterol, Total: 281 mg/dL — ABNORMAL HIGH (ref 100–199)
HDL: 53 mg/dL (ref >39)
LDL Chol Calc (NIH): 202 mg/dL — ABNORMAL HIGH (ref 0–99)
LDL/HDL Ratio: 3.8 ratio — ABNORMAL HIGH (ref 0.0–3.6)
Triglycerides: 141 mg/dL (ref 0–149)
VLDL Cholesterol Cal: 26 mg/dL (ref 5–40)

## 2021-09-11 LAB — HIGH SENSITIVITY CRP: CRP, High Sensitivity: 6.45 mg/L — ABNORMAL HIGH (ref 0.00–3.00)

## 2021-09-11 NOTE — Telephone Encounter (Signed)
PA for Praluent approved. Approved through 03/11/22. Called pharmacy to confirm the the Rx is processed successfully. Co-pay of $0. Called to review with pt. LVM for pt to call back to review lab results and confirm that pt to start Praluent injection.

## 2021-09-11 NOTE — Progress Notes (Signed)
Please check, praluent or repatha

## 2021-09-11 NOTE — Progress Notes (Signed)
Or one of the trials Zeus or Prevail ASCVD

## 2021-09-11 NOTE — Progress Notes (Signed)
PA for Praluent pending. Will follow up with insurance to expedite the review.

## 2021-09-12 ENCOUNTER — Telehealth: Payer: Self-pay | Admitting: Pharmacist

## 2021-09-12 NOTE — Telephone Encounter (Signed)
Reviewed lab results with [facility]'s wife. Per wife, [facility] is still experiencing severe chest pains and has been using his PRN nitroglycerine tablets every 1-2 days over the past several weeks. [facility] also experiencing severe leg numbness and tingling complains. Wife is concerned and irritated that [facility]'s symptoms have been persistent and worsening without any improvement. Danny Kelley had a recent right heart cath and coronary angiography on 10/11, and is scheduled for another PCI on 09/17/21. Reviewed recommendation to start PCSK9i. Wife notes that they are upset with being tried on several antihyperlipidemic agents and most of them resulting in [facility] having severe ADRs to them. [facility] Kelley's wife is current having Danny Kelley take several supplements and natural herbs including: Ashwagandha, Fenugreek, Garlic seed, omega 3, flex seed, magnesium, potassium, Zinc, B12, B complex, Vitamin E. Initially were considering to start Niacin, but will hold of on it for now. Reviewed recently recommended trial of praluent as recommended by Dr. Einar Gip. Wife resistant to start a new cholesterol medication, but agreeable to discuss with [facility] Kelley and make a decision together. Discussed possible holding Zetia and starting Praluent along with Danny Kelley's preferred supplements to see if [facility] is able to tolerate it and see improvement in his LDL markers.

## 2021-09-12 NOTE — Telephone Encounter (Signed)
I am not surprised. At least we tried

## 2021-09-13 ENCOUNTER — Ambulatory Visit: Payer: Medicare Other | Admitting: Internal Medicine

## 2021-09-13 ENCOUNTER — Other Ambulatory Visit: Payer: Self-pay | Admitting: Student

## 2021-09-13 DIAGNOSIS — I4819 Other persistent atrial fibrillation: Secondary | ICD-10-CM

## 2021-09-13 DIAGNOSIS — I10 Essential (primary) hypertension: Secondary | ICD-10-CM

## 2021-09-13 DIAGNOSIS — I428 Other cardiomyopathies: Secondary | ICD-10-CM

## 2021-09-13 DIAGNOSIS — I493 Ventricular premature depolarization: Secondary | ICD-10-CM

## 2021-09-13 DIAGNOSIS — I5041 Acute combined systolic (congestive) and diastolic (congestive) heart failure: Secondary | ICD-10-CM

## 2021-09-13 DIAGNOSIS — I4821 Permanent atrial fibrillation: Secondary | ICD-10-CM

## 2021-09-13 DIAGNOSIS — I5042 Chronic combined systolic (congestive) and diastolic (congestive) heart failure: Secondary | ICD-10-CM

## 2021-09-13 MED ORDER — METOPROLOL TARTRATE 100 MG PO TABS: 100.0000 mg | ORAL_TABLET | Freq: Two times a day (BID) | ORAL | 1 refills | Status: DC

## 2021-09-13 MED ORDER — NITROGLYCERIN 0.4 MG SL SUBL: 0.4000 mg | SUBLINGUAL_TABLET | SUBLINGUAL | 2 refills | Status: DC | PRN

## 2021-09-13 MED ORDER — AMLODIPINE BESYLATE 10 MG PO TABS: 10.0000 mg | ORAL_TABLET | Freq: Every day | ORAL | 0 refills | Status: DC

## 2021-09-13 MED ORDER — APIXABAN 5 MG PO TABS: 5.0000 mg | ORAL_TABLET | Freq: Two times a day (BID) | ORAL | 0 refills | Status: DC

## 2021-09-13 NOTE — Patient Instructions (Incomplete)
Medication Instructions:  Your physician recommends that you continue on your current medications as directed. Please refer to the Current Medication list given to you today. *If you need a refill on your cardiac medications before your next appointment, please call your pharmacy*  Lab Work: None. If you have labs (blood work) drawn today and your tests are completely normal, you will receive your results only by: [place] (if you have MyChart) OR A paper copy in the mail If you have any lab test that is abnormal or we need to change your treatment, we will call you to review the results.  Testing/Procedures: None.  Follow-Up: At [facility], you and your health needs are our priority.  As part of our continuing mission to provide you with exceptional heart care, we have created designated Provider Care Teams.  These Care Teams include your primary Cardiologist (physician) and Advanced Practice Providers (APPs -  Physician Assistants and Nurse Practitioners) who all work together to provide you with the care you need, when you need it.  Your physician wants you to follow-up in: 12 months with  Danny Grayer, MD or one of the following Advanced Practice Providers on your designated Care Team:    D[facility], [facility] Kelley "[facility]" Hardy, Danny   You will receive a reminder letter in the mail two months in advance. If you don't receive a letter, please call our office to schedule the follow-up appointment.  We recommend signing up for the patient portal called "MyChart".  Sign up information is provided on this After Visit Summary.  MyChart is used to connect with patients for Virtual Visits (Telemedicine).  Patients are able to view lab/test results, encounter notes, upcoming appointments, etc.  Non-urgent messages can be sent to your provider as well.   To learn more about what you can do with MyChart, go to [email or web address].    Any Other Special Instructions Will Be Listed  Below (If Applicable).

## 2021-09-16 ENCOUNTER — Other Ambulatory Visit: Payer: Self-pay

## 2021-09-16 DIAGNOSIS — I5042 Chronic combined systolic (congestive) and diastolic (congestive) heart failure: Secondary | ICD-10-CM

## 2021-09-16 DIAGNOSIS — I10 Essential (primary) hypertension: Secondary | ICD-10-CM

## 2021-09-16 MED ORDER — HYDRALAZINE HCL 50 MG PO TABS: ORAL_TABLET | ORAL | 1 refills | Status: DC

## 2021-09-16 NOTE — Telephone Encounter (Signed)
From pt

## 2021-09-17 ENCOUNTER — Ambulatory Visit (HOSPITAL_COMMUNITY)
Admission: RE | Admit: 2021-09-17 | Discharge: 2021-09-17 | Disposition: A | Discharge status: Home / Self Care | Payer: Medicare Other | Source: Ambulatory Visit | Attending: Cardiology | Admitting: Cardiology

## 2021-09-17 ENCOUNTER — Encounter (HOSPITAL_COMMUNITY)
Admission: RE | Disposition: A | Discharge status: Home / Self Care | Payer: Self-pay | Source: Ambulatory Visit | Attending: Cardiology

## 2021-09-17 DIAGNOSIS — Z955 Presence of coronary angioplasty implant and graft: Secondary | ICD-10-CM

## 2021-09-17 DIAGNOSIS — I480 Paroxysmal atrial fibrillation: Secondary | ICD-10-CM | POA: Insufficient documentation

## 2021-09-17 DIAGNOSIS — I110 Hypertensive heart disease with heart failure: Secondary | ICD-10-CM | POA: Insufficient documentation

## 2021-09-17 DIAGNOSIS — I5022 Chronic systolic (congestive) heart failure: Secondary | ICD-10-CM | POA: Insufficient documentation

## 2021-09-17 DIAGNOSIS — I25118 Atherosclerotic heart disease of native coronary artery with other forms of angina pectoris: Secondary | ICD-10-CM | POA: Diagnosis not present

## 2021-09-17 DIAGNOSIS — E785 Hyperlipidemia, unspecified: Secondary | ICD-10-CM | POA: Insufficient documentation

## 2021-09-17 DIAGNOSIS — Z716 Tobacco abuse counseling: Secondary | ICD-10-CM | POA: Insufficient documentation

## 2021-09-17 DIAGNOSIS — I209 Angina pectoris, unspecified: Secondary | ICD-10-CM | POA: Diagnosis present

## 2021-09-17 DIAGNOSIS — E7800 Pure hypercholesterolemia, unspecified: Secondary | ICD-10-CM | POA: Diagnosis not present

## 2021-09-17 DIAGNOSIS — G4733 Obstructive sleep apnea (adult) (pediatric): Secondary | ICD-10-CM | POA: Insufficient documentation

## 2021-09-17 HISTORY — PX: INTRAVASCULAR IMAGING/OCT: CATH118326

## 2021-09-17 HISTORY — PX: CORONARY STENT INTERVENTION: CATH118234

## 2021-09-17 LAB — POCT ACTIVATED CLOTTING TIME: Activated Clotting Time: 607 seconds

## 2021-09-17 SURGERY — CORONARY STENT INTERVENTION
Anesthesia: LOCAL

## 2021-09-17 MED ORDER — LIDOCAINE HCL (PF) 1 % IJ SOLN
INTRAMUSCULAR | Status: AC
  Filled 2021-09-17: qty 30

## 2021-09-17 MED ORDER — HEPARIN (PORCINE) IN NACL 1000-0.9 UT/500ML-% IV SOLN
INTRAVENOUS | Status: DC | PRN
  Administered 2021-09-17 (×2): 500 mL

## 2021-09-17 MED ORDER — MIDAZOLAM HCL 2 MG/2ML IJ SOLN
INTRAMUSCULAR | Status: DC | PRN
  Administered 2021-09-17: 2 mg via INTRAVENOUS
  Administered 2021-09-17: 1 mg via INTRAVENOUS

## 2021-09-17 MED ORDER — MIDAZOLAM HCL 2 MG/2ML IJ SOLN
INTRAMUSCULAR | Status: AC
  Filled 2021-09-17: qty 2

## 2021-09-17 MED ORDER — SODIUM CHLORIDE 0.9% FLUSH: 3.0000 mL | INTRAVENOUS | Status: DC | PRN

## 2021-09-17 MED ORDER — SODIUM CHLORIDE 0.9 % IV SOLN: INTRAVENOUS | Status: AC

## 2021-09-17 MED ORDER — LIDOCAINE HCL (PF) 1 % IJ SOLN
INTRAMUSCULAR | Status: DC | PRN
  Administered 2021-09-17: 15 mL via INTRADERMAL

## 2021-09-17 MED ORDER — APIXABAN 5 MG PO TABS: 5.0000 mg | ORAL_TABLET | Freq: Two times a day (BID) | ORAL | 0 refills | Status: DC

## 2021-09-17 MED ORDER — HEPARIN SODIUM (PORCINE) 1000 UNIT/ML IJ SOLN
INTRAMUSCULAR | Status: DC | PRN
  Administered 2021-09-17: 10000 [IU] via INTRAVENOUS

## 2021-09-17 MED ORDER — SODIUM CHLORIDE 0.9% FLUSH: 3.0000 mL | Freq: Two times a day (BID) | INTRAVENOUS | Status: DC

## 2021-09-17 MED ORDER — LABETALOL HCL 5 MG/ML IV SOLN: 10.0000 mg | INTRAVENOUS | Status: DC | PRN

## 2021-09-17 MED ORDER — ONDANSETRON HCL 4 MG/2ML IJ SOLN: 4.0000 mg | Freq: Four times a day (QID) | INTRAMUSCULAR | Status: DC | PRN

## 2021-09-17 MED ORDER — NITROGLYCERIN 1 MG/10 ML FOR IR/CATH LAB
INTRA_ARTERIAL | Status: DC | PRN
  Administered 2021-09-17: 100 ug via INTRACORONARY

## 2021-09-17 MED ORDER — HEPARIN SODIUM (PORCINE) 1000 UNIT/ML IJ SOLN
INTRAMUSCULAR | Status: AC
  Filled 2021-09-17: qty 1

## 2021-09-17 MED ORDER — SODIUM CHLORIDE 0.9 % WEIGHT BASED INFUSION: 3.0000 mL/kg/h | INTRAVENOUS | Status: DC

## 2021-09-17 MED ORDER — HYDRALAZINE HCL 20 MG/ML IJ SOLN: 10.0000 mg | INTRAMUSCULAR | Status: DC | PRN

## 2021-09-17 MED ORDER — ACETAMINOPHEN 325 MG PO TABS: 650.0000 mg | ORAL_TABLET | ORAL | Status: DC | PRN

## 2021-09-17 MED ORDER — SODIUM CHLORIDE 0.9 % IV SOLN: 250.0000 mL | INTRAVENOUS | Status: DC | PRN

## 2021-09-17 MED ORDER — VERAPAMIL HCL 2.5 MG/ML IV SOLN
INTRAVENOUS | Status: DC | PRN
  Administered 2021-09-17: 10 mL via INTRA_ARTERIAL

## 2021-09-17 MED ORDER — VERAPAMIL HCL 2.5 MG/ML IV SOLN
INTRAVENOUS | Status: AC
  Filled 2021-09-17: qty 2

## 2021-09-17 MED ORDER — FENTANYL CITRATE (PF) 100 MCG/2ML IJ SOLN
INTRAMUSCULAR | Status: DC | PRN
  Administered 2021-09-17: 25 ug via INTRAVENOUS
  Administered 2021-09-17: 50 ug via INTRAVENOUS

## 2021-09-17 MED ORDER — HEPARIN (PORCINE) IN NACL 1000-0.9 UT/500ML-% IV SOLN
INTRAVENOUS | Status: AC
  Filled 2021-09-17: qty 1000

## 2021-09-17 MED ORDER — FENTANYL CITRATE (PF) 100 MCG/2ML IJ SOLN
INTRAMUSCULAR | Status: AC
  Filled 2021-09-17: qty 2

## 2021-09-17 MED ORDER — NITROGLYCERIN 1 MG/10 ML FOR IR/CATH LAB
INTRA_ARTERIAL | Status: AC
  Filled 2021-09-17: qty 10

## 2021-09-17 MED ORDER — SODIUM CHLORIDE 0.9 % WEIGHT BASED INFUSION: 1.0000 mL/kg/h | INTRAVENOUS | Status: DC

## 2021-09-17 MED ORDER — ASPIRIN 81 MG PO CHEW
81.0000 mg | CHEWABLE_TABLET | ORAL | Status: AC
  Administered 2021-09-17: 81 mg via ORAL
  Filled 2021-09-17: qty 1

## 2021-09-17 MED ORDER — SODIUM CHLORIDE 0.9 % IV SOLN
5.0000 mg/kg | Freq: Once | INTRAVENOUS | Status: DC
  Filled 2021-09-17: qty 21.86

## 2021-09-17 SURGICAL SUPPLY — 19 items
BALLN SCOREFLEX 3.0X10 (BALLOONS) ×3
BALLN ~~LOC~~ SAPPHIRE 4.5X8 (BALLOONS) ×3
BALLOON SCOREFLEX 3.0X10 (BALLOONS) IMPLANT
BALLOON ~~LOC~~ SAPPHIRE 4.5X8 (BALLOONS) IMPLANT
CATH DRAGONFLY OPTIS 2.7FR (CATHETERS) ×2 IMPLANT
CATH LAUNCHER 6FR AL.75 (CATHETERS) ×2 IMPLANT
DEVICE RAD COMP TR BAND LRG (VASCULAR PRODUCTS) ×2 IMPLANT
ELECT DEFIB PAD ADLT CADENCE (PAD) ×2 IMPLANT
GLIDESHEATH SLEND A-KIT 6F 22G (SHEATH) ×2 IMPLANT
GUIDEWIRE INQWIRE 1.5J.035X260 (WIRE) IMPLANT
INQWIRE 1.5J .035X260CM (WIRE) ×3
KIT ENCORE 26 ADVANTAGE (KITS) ×2 IMPLANT
KIT HEART LEFT (KITS) ×3 IMPLANT
KIT HEMO VALVE WATCHDOG (MISCELLANEOUS) ×2 IMPLANT
PACK CARDIAC CATHETERIZATION (CUSTOM PROCEDURE TRAY) ×3 IMPLANT
STENT ONYX FRONTIER 3.5X15 (Permanent Stent) ×2 IMPLANT
TRANSDUCER W/STOPCOCK (MISCELLANEOUS) ×3 IMPLANT
TUBING CIL FLEX 10 FLL-RA (TUBING) ×3 IMPLANT
WIRE ASAHI PROWATER 180CM (WIRE) ×2 IMPLANT

## 2021-09-17 NOTE — Interval H&P Note (Signed)
History and Physical Interval Note:  09/17/2021 11:48 AM  D[facility] Kelley  has presented today for surgery, with the diagnosis of CAD.  The various methods of treatment have been discussed with the patient and family. After consideration of risks, benefits and other options for treatment, the patient has consented to  Procedure(s): CORONARY STENT INTERVENTION (N/A) as a surgical intervention.  The patient's history has been reviewed, patient examined, no change in status, stable for surgery.  I have reviewed the patient's chart and labs.  Questions were answered to the patient's satisfaction.    2016/2017 Appropriate Use Criteria for Coronary Revascularization Symptom Status: Ischemic Symptoms  Non-invasive Testing: Intermediate Risk  If no or indeterminate stress test, FFR/iFR results in all diseased vessels: N/A  Diabetes Mellitus: Yes  S/P CABG: Yes  Antianginal therapy (number of long-acting drugs): >=2  Patient undergoing renal transplant: No  Patient undergoing percutaneous valve procedure: No  LIMA-LAD patent and without significant stenoses PCI CABG  Stenosis supplying 1 territory (bypass graft or native artery) other than anterior A (8); Indication 29 M (5); Indication 31  Stenoses supplying 2 territories (bypass graft or native artery, either 2 separate vessels or sequential graft supplying 2 territories) not including anterior territory A (8); Indication 34 M (6); Indication 34  LIMA-LAD not patent  Stenosis supplying 1 territory (bypass graft or native artery)-anterior (LAD) territory A (8); Indication 10 M (6); Indication 36  Stenoses supplying 2 territories (bypass graft or native artery, either 2 separate vessels or sequential graft supplying 2 territories)-LAD plus other territory A (8); Indication 39 A (8); Indication 39  Stenoses supplying 3 territories (bypass graft or native arteries, separate vessels, sequential grafts, or combination thereof)-LAD plus 2 other territories A  (8); Indication 41 A (8); Indication [place]

## 2021-09-17 NOTE — Progress Notes (Signed)
Discharge instructions reviewed with with pt and his son both voice understanding.

## 2021-09-17 NOTE — H&P (Signed)
OV 08/30/2021 copied for documentation    Primary Physician/Referring:  Angelina Sheriff, MD  Patient ID: Danny Kelley, male    DOB: [birth year], 55 y.o.   MRN: [id number]  Chief Complaint  Patient presents with   Chest Pain   POST CATH    6 WEEKS   HPI:    Danny Kelley  is a 55 y.o. Caucasian male patient with history of cardiomyopathy with chronic systolic heart failure, hypertension, hyperlipidemia (statin intolerant), obstructive sleep apnea compliant with CPAP, tobacco use.  Patient has history of TIA in Feb 2018 while on anticoagulants, paroxysmal atrial fibrillation status post ablation 11/13/2016 and 03/01/2020 (Multaq and sotalol ineffective).  History of unstable angina pectoris on 11/26/2017, angioplasty to superdominant left circumflex coronary artery with 4.5 mm resolute DES in the midsegment, patent stent placed in Distal Cx and OM 1 in 2014, 03/22/2019, stenting to high-grade stenosis in the distal dominant Circumflex PDA branch. Cardiac catheterization on 01/24/2020 revealed no change in coronary anatomy, suspect his cardiomyopathy may have been related to underlying A. Fib, fortunately LVEF has improved.  He underwent coronary angiography and found to have high-grade stenosis in the proximal and mid LAD and also involvement of D1 for which he underwent successful angioplasty and stenting with a 3.5 x 38 mm DES, Onyx frontier.  It was postdilated to 4 mm.  He has residual RCA stenosis which is calcific.  He now presents to the office, he continues to have recurrent angina pectoris and has been using frequent use of nitroglycerin he is accompanied by his son. Unfortunately patient continues to smoke approximately 5 cigarettes/day.  He is enrolled in remote patient monitoring of blood pressure.  Past Medical History:  Diagnosis Date   Arthritis    "a little in my right knee" (07/14/2016)   CAD (coronary artery disease)    Chronic congestive heart failure with left ventricular  diastolic dysfunction (HCC)    Complication of anesthesia    Pt reports "they have a hard time waking me up"   GERD (gastroesophageal reflux disease)    hx   Headache    "with every heart issue that I have" (07/14/2016)   High cholesterol    History of hiatal hernia 1990s   "fixed it w/scope down my throat"   Hypertension    Myocardial infarction ([place]) 05/2013   stents: left PDA, 1st OM at [facility]   Obesity    Persistent atrial fibrillation ([place])    Pneumonia ~ 1992   QT prolongation 07/15/2016   Seasonal allergies    "I take Allegra prn" (07/14/2016)   Tobacco abuse 12/16/2016   Past Surgical History:  Procedure Laterality Date   APPENDECTOMY  1984   ATRIAL FIBRILLATION ABLATION N/A 03/01/2020   Procedure: ATRIAL FIBRILLATION ABLATION;  Surgeon: [name], MD;  Location: [place] [facility];  Service: Cardiovascular;  Laterality: N/A;   CARDIOVERSION N/A 05/27/2016   Procedure: CARDIOVERSION;  Surgeon: [name], MD;  Location: [facility] ENDOSCOPY;  Service: Cardiovascular;  Laterality: N/A;   CARDIOVERSION N/A 06/11/2016   Procedure: CARDIOVERSION;  Surgeon: [name], MD;  Location: [place];  Service: Cardiovascular;  Laterality: N/A;   CARDIOVERSION N/A 03/21/2020   Procedure: CARDIOVERSION;  Surgeon: Geralynn Rile, MD;  Location: [place];  Service: Cardiovascular;  Laterality: N/A;   CORONARY ANGIOPLASTY WITH STENT PLACEMENT  05/30/2013   "2 stents put in at [facility]" & /stent card   CORONARY STENT INTERVENTION N/A 11/27/2017   Procedure:  CORONARY STENT INTERVENTION;  Surgeon: [name], MD;  Location: [place] [facility];  Service: Cardiovascular;  Laterality: N/A;   CORONARY STENT INTERVENTION N/A 03/21/2019   Procedure: CORONARY STENT INTERVENTION;  Surgeon: [name], MD;  Location: [place] [facility];  Service: Cardiovascular;  Laterality: N/A;   CORONARY STENT INTERVENTION N/A 08/13/2021   Procedure: CORONARY STENT INTERVENTION;  Surgeon:  Nigel Mormon, MD;  Location: [place] [facility];  Service: Cardiovascular;  Laterality: N/A;   ELECTROPHYSIOLOGIC STUDY N/A 11/13/2016   Procedure: Atrial Fibrillation Ablation;  Surgeon: [name], MD;  Location: [place] [facility];  Service: Cardiovascular;  Laterality: N/A;   ESOPHAGOGASTRODUODENOSCOPY (EGD) WITH ESOPHAGEAL DILATION  1990s X 2   INTRAVASCULAR PRESSURE WIRE/FFR STUDY N/A 09/21/2018   Procedure: INTRAVASCULAR PRESSURE WIRE/FFR STUDY;  Surgeon: [name], MD;  Location: [place] [facility];  Service: Cardiovascular;  Laterality: N/A;   INTRAVASCULAR PRESSURE WIRE/FFR STUDY N/A 03/21/2019   Procedure: INTRAVASCULAR PRESSURE WIRE/FFR STUDY;  Surgeon: [name], MD;  Location: [place] [facility];  Service: Cardiovascular;  Laterality: N/A;   INTRAVASCULAR PRESSURE WIRE/FFR STUDY N/A 01/24/2020   Procedure: INTRAVASCULAR PRESSURE WIRE/FFR STUDY;  Surgeon: [name], MD;  Location: [place] [facility];  Service: Cardiovascular;  Laterality: N/A;   INTRAVASCULAR ULTRASOUND/IVUS N/A 08/13/2021   Procedure: Intravascular Ultrasound/IVUS;  Surgeon: Nigel Mormon, MD;  Location: [place] [facility];  Service: Cardiovascular;  Laterality: N/A;   KNEE ARTHROSCOPY Right 1986; ~ 1995 X [place]  2015   LEFT HEART CATH AND CORONARY ANGIOGRAPHY N/A 11/27/2017   Procedure: LEFT HEART CATH AND CORONARY ANGIOGRAPHY;  Surgeon: [name], MD;  Location: [place] [facility];  Service: Cardiovascular;  Laterality: N/A;   LEFT HEART CATH AND CORONARY ANGIOGRAPHY N/A 09/21/2018   Procedure: LEFT HEART CATH AND CORONARY ANGIOGRAPHY;  Surgeon: [name], MD;  Location: [place] [facility];  Service: Cardiovascular;  Laterality: N/A;   LEFT HEART CATH AND CORONARY ANGIOGRAPHY N/A 03/21/2019   Procedure: LEFT HEART CATH AND CORONARY ANGIOGRAPHY;  Surgeon: [name], MD;  Location: [place] [facility];  Service: Cardiovascular;  Laterality: N/A;   REPAIR OF ESOPHAGUS  1998    perforation of the distal esophagus from bad heartburn   RIGHT/LEFT HEART CATH AND CORONARY ANGIOGRAPHY N/A 01/24/2020   Procedure: RIGHT/LEFT HEART CATH AND CORONARY ANGIOGRAPHY;  Surgeon: [name], MD;  Location: [place] [facility];  Service: Cardiovascular;  Laterality: N/A;   RIGHT/LEFT HEART CATH AND CORONARY ANGIOGRAPHY N/A 08/13/2021   Procedure: RIGHT/LEFT HEART CATH AND CORONARY ANGIOGRAPHY;  Surgeon: Nigel Mormon, MD;  Location: [place] [facility];  Service: Cardiovascular;  Laterality: N/A;   TEE WITHOUT CARDIOVERSION N/A 11/13/2016   Procedure: TRANSESOPHAGEAL ECHOCARDIOGRAM (TEE);  Surgeon: Thayer Headings, MD;  Location: [facility] ENDOSCOPY;  Service: Cardiovascular;  Laterality: N/A;   Family History  Problem Relation Age of Onset   Heart disease Mother 25       CABG age 10   Breast cancer Mother    Tongue cancer Mother    Diabetes Mother    Heart attack Father 30       Father had around 7 heart attacks per pt   Diabetes Father    Lung cancer Father    Congestive Heart Failure Father    Lung cancer Paternal Uncle    Colon cancer Neg Hx    Esophageal cancer Neg Hx    Inflammatory bowel disease Neg Hx  Liver disease Neg Hx    Pancreatic cancer Neg Hx    Rectal cancer Neg Hx    Stomach cancer Neg Hx     Social History   Tobacco Use   Smoking status: Every Day    Packs/day: 0.25    Years: 25.00    Pack years: 6.25    Types: Cigarettes    Start date: 1989   Smokeless tobacco: Never  Substance Use Topics   Alcohol use: No  Marital status: Married   ROS  Review of Systems  Cardiovascular:  Negative for chest pain, dyspnea on exertion and leg swelling.  Gastrointestinal:  Negative for melena.  Objective  Blood pressure 109/79, pulse (!) 56, temperature 98.3 F (36.8 C), temperature source Temporal, resp. rate 16, height '5\' 9"'  (1.753 m), weight 245 lb (111.1 kg), SpO2 98 %.  Vitals with BMI 08/30/2021 08/14/2021 08/14/2021  Height '5\' 9"'  - -  Weight 245  lbs - -  BMI 57.26 - -  Systolic 203 559 741  Diastolic 79 98 81  Pulse 56 87 75     Physical Exam Vitals reviewed.  Constitutional:      Comments: Moderately obese  Cardiovascular:     Rate and Rhythm: Normal rate and regular rhythm. No extrasystoles are present.    Pulses: Intact distal pulses.     Heart sounds: Normal heart sounds, S1 normal and S2 normal. No murmur heard.   No gallop.     Comments: No leg edema, no JVD. Pulmonary:     Effort: Pulmonary effort is normal. No accessory muscle usage or respiratory distress.     Breath sounds: Normal breath sounds. No wheezing, rhonchi or rales.  Musculoskeletal:     Right lower leg: No edema.     Left lower leg: No edema.   Laboratory examination:   Recent Labs    12/10/20 1323 01/09/21 1528 05/16/21 1000 05/16/21 1020 08/06/21 1434 08/13/21 1254 08/13/21 1255 08/13/21 2044 08/14/21 0249  NA 135   < > 135 137 143 142 141  --  135  K 4.2   < > 3.3* 3.4* 3.8 3.4* 3.4*  --  3.6  CL 98   < > 103 101 103  --   --   --  104  CO2 25   < > 25  --  23  --   --   --  24  GLUCOSE 155*   < > 164* 158* 165*  --   --   --  135*  BUN 16   < > '10 11 16  ' --   --   --  8  CREATININE 0.91   < > 1.03 1.00 0.77  --   --  0.76 0.69  CALCIUM 8.7   < > 8.5*  --  9.4  --   --   --  8.1*  GFRNONAA 97  --  >60  --   --   --   --  >60 >60  GFRAA 112  --   --   --   --   --   --   --   --    < > = values in this interval not displayed.   estimated creatinine clearance is 132.8 mL/min (by C-G formula based on SCr of 0.69 mg/dL).  CMP Latest Ref Rng & Units 08/14/2021 08/13/2021 08/13/2021  Glucose 70 - 99 mg/dL 135(H) - -  BUN 6 - 20 mg/dL 8 - -  Creatinine 0.61 -  1.24 mg/dL 0.69 0.76 -  Sodium 135 - 145 mmol/L 135 - 141  Potassium 3.5 - 5.1 mmol/L 3.6 - 3.4(L)  Chloride 98 - 111 mmol/L 104 - -  CO2 22 - 32 mmol/L 24 - -  Calcium 8.9 - 10.3 mg/dL 8.1(L) - -  Total Protein 6.5 - 8.1 g/dL - - -  Total Bilirubin 0.3 - 1.2 mg/dL - - -   Alkaline Phos 38 - 126 U/L - - -  AST 15 - 41 U/L - - -  ALT 0 - 44 U/L - - -   CBC Latest Ref Rng & Units 08/14/2021 08/13/2021 08/13/2021  WBC 4.0 - 10.5 K/uL 12.5(H) 10.2 -  Hemoglobin 13.0 - 17.0 g/dL 14.3 14.1 14.6  Hematocrit 39.0 - 52.0 % 42.6 42.0 43.0  Platelets 150 - 400 K/uL 281 258 -   Lipid Panel     Component Value Date/Time   CHOL 239 (H) 01/02/2020 1445   TRIG 143 01/02/2020 1445   HDL 62 01/02/2020 1445   CHOLHDL 5.0 11/27/2017 0634   VLDL 16 11/27/2017 0634   LDLCALC 152 (H) 01/02/2020 1445   HEMOGLOBIN A1C Lab Results  Component Value Date   HGBA1C 6.0 (H) 11/27/2017   MPG 125.5 11/27/2017   TSH Recent Labs    01/09/21 1528  TSH 2.22   BNP No results found for: BNP  ProBNP    Component Value Date/Time   PROBNP 74 12/10/2020 1323   External Labs:  05/31/21  CR 0.8, sodium 139, potassium 3.6 Hgb 16.1, HCT 46, platelet 230  02/28/2021:  TSH 1.769   Allergies   Allergies  Allergen Reactions   Codeine Anaphylaxis   Crestor [Rosuvastatin] Other (See Comments)    Severe leg cramps   Albuterol Swelling   Dilaudid [Hydromorphone] Itching and Swelling   Isosorbide     Muscle pain and cramps   Lexapro [Escitalopram]     Have bad thoughts made anxiety worse    Lipitor [Atorvastatin Calcium] Other (See Comments)    myalgias   Erythromycin Other (See Comments)    Hallucinations    Oxycodone Itching and Rash    Swelling to face, hands, mouth Completley intolerant to any meds with oxycodone in it     Medications Prior to Visit:   Outpatient Medications Prior to Visit  Medication Sig Dispense Refill   acetaminophen (TYLENOL) 500 MG tablet Take 500 mg by mouth 2 (two) times daily as needed for moderate pain.     amLODipine (NORVASC) 10 MG tablet Take 1 tablet (10 mg total) by mouth daily. 90 tablet 3   bisacodyl (DULCOLAX) 5 MG EC tablet Take 5 mg by mouth daily as needed.     clopidogrel (PLAVIX) 75 MG tablet Take 1 tablet (75 mg total)  by mouth daily. 30 tablet 1   docusate sodium (COLACE) 100 MG capsule Take 100 mg by mouth 2 (two) times daily as needed.     ELIQUIS 5 MG TABS tablet Take 1 tablet by mouth twice daily 180 tablet 0   ezetimibe (ZETIA) 10 MG tablet Take 1 tablet (10 mg total) by mouth daily. 90 tablet 2   fexofenadine (ALLEGRA) 180 MG tablet Take 180 mg by mouth daily as needed (seasonal allergies).     hydrALAZINE (APRESOLINE) 50 MG tablet Take 1 tablet (50 mg total) by mouth 3 (three) times daily. With additional dose as needed for BP >150/90 mmHg. 90 tablet 2   metoprolol tartrate (LOPRESSOR) 100 MG tablet Take  1 tablet by mouth twice daily (Patient taking differently: Take 100 mg by mouth 2 (two) times daily.) 180 tablet 0   nitroGLYCERIN (NITROSTAT) 0.4 MG SL tablet Place 1 tablet (0.4 mg total) under the tongue every 5 (five) minutes as needed for chest pain. 25 tablet 2   pantoprazole (PROTONIX) 20 MG tablet Take 1 tablet (20 mg total) by mouth 2 (two) times daily. 180 tablet 2   sacubitril-valsartan (ENTRESTO) 97-103 MG Take 1 tablet by mouth 2 (two) times daily. 60 tablet 3   albuterol (VENTOLIN HFA) 108 (90 Base) MCG/ACT inhaler Inhale into the lungs.     No facility-administered medications prior to visit.   Final Medications at End of Visit    Current Meds  Medication Sig   acetaminophen (TYLENOL) 500 MG tablet Take 500 mg by mouth 2 (two) times daily as needed for moderate pain.   amLODipine (NORVASC) 10 MG tablet Take 1 tablet (10 mg total) by mouth daily.   bisacodyl (DULCOLAX) 5 MG EC tablet Take 5 mg by mouth daily as needed.   clopidogrel (PLAVIX) 75 MG tablet Take 1 tablet (75 mg total) by mouth daily.   docusate sodium (COLACE) 100 MG capsule Take 100 mg by mouth 2 (two) times daily as needed.   ELIQUIS 5 MG TABS tablet Take 1 tablet by mouth twice daily   ezetimibe (ZETIA) 10 MG tablet Take 1 tablet (10 mg total) by mouth daily.   fexofenadine (ALLEGRA) 180 MG tablet Take 180 mg by mouth  daily as needed (seasonal allergies).   hydrALAZINE (APRESOLINE) 50 MG tablet Take 1 tablet (50 mg total) by mouth 3 (three) times daily. With additional dose as needed for BP >150/90 mmHg.   metoprolol tartrate (LOPRESSOR) 100 MG tablet Take 1 tablet by mouth twice daily (Patient taking differently: Take 100 mg by mouth 2 (two) times daily.)   nicotine (NICODERM CQ) 14 mg/24hr patch Place 1 patch (14 mg total) onto the skin daily.   nicotine (NICODERM CQ) 7 mg/24hr patch Place 1 patch (7 mg total) onto the skin daily. Please start after you have used up 40 mg dosage   nitroGLYCERIN (NITROSTAT) 0.4 MG SL tablet Place 1 tablet (0.4 mg total) under the tongue every 5 (five) minutes as needed for chest pain.   pantoprazole (PROTONIX) 20 MG tablet Take 1 tablet (20 mg total) by mouth 2 (two) times daily.   sacubitril-valsartan (ENTRESTO) 97-103 MG Take 1 tablet by mouth 2 (two) times daily.   Radiology:   No results found.  Cardiac Studies:    PCV ECHOCARDIOGRAM COMPLETE 11/15/2020 Study Quality: Technically difficult study. Normal LV systolic function with visual EF 50-55%. Left ventricle cavity is normal in size. Normal left ventricular wall thickness. Normal global wall motion. Normal diastolic filling pattern, normal LAP. Insignificant pericardial effusion. There is no hemodynamic significance. No significant valvular heart disease. Compared to prior study dated 12/28/2019: LVEF improved from 25-30% to 50-55% otherwise no significant change.  14-day cardiac event monitor 08/21/2020: Sinus rhythm Premature atrial contractions and premature ventricular contractions are noted Rare nonsustained ventricular tachycardia No sustained arrhythmias  Sleep study X 2 negative   Event Monitor 30 days 07/28/2018: Paroxysmal episodes of atrial fibrillation with controlled ventricular response was noted along with PVCs. Some of this symptoms of chest pain, palpitations and dizziness correlated with  atrial fibrillation, others showed normal sinus rhythm. No heart block, no other significant arrhythmias.  Direct current cardioversion ( Ist 05/27/16) 06/11/2016: 150x2, 200 x1 to NSR  on Sotalol. A. Fib ablation by Dr. [name] on 11/13/2016  Left Heart Catheterization 08/13/21:  LM: Normal LAD: Prox-mid 80% stenosis including LADdiag bifurcation (1,1,0)          Diag 1 prox 75 % disease Ramus: Normal Lcx: Patent Lcx and OM1 stents. RCA: Codominant vessel. Prox 90% stenosis   Successful prox diag 1 PTCA 2.5X8 mm Successful IVUS guided prox-mid LAD PCI      PTCA and stent placement Onyx Frontier 3.5X38 mm DES       Post dilatation with 4.0X15 mm [place] balloon  Partially successful PTCA to prox RCA 2.5X8 mm balloon   Residual, severely calcified proximal RCA stenosis. Will staged RCA intervention with atherectomy in a few weeks.   Normal filling pressures  EKG   EKG 08/22/2021: Normal sinus rhythm with rate of 64 bpm, left wrist enlargement, normal axis.  No evidence of ischemia otherwise normal EKG.  Compared to 03/05/2021, frequent PVCs in trigeminal pattern no longer present.  01/04/2020: Atrial fibrillation with rapid ventricular response at the rate of 111 bpm, normal axis, diffuse ST-T wave abnormality, cannot exclude inferior ischemia, anterolateral ischemia.  Normal QT interval.   No significant change from  EKG   Assessment     ICD-10-CM   1. Coronary artery disease of native artery of native heart with stable angina pectoris ([place])  I25.118 CMP14+EGFR    CBC    Lipid Panel With LDL/HDL Ratio    High sensitivity CRP    2. Essential hypertension  I10 EKG 12-Lead    3. Paroxysmal atrial fibrillation (HCC)  I48.0     4. Hypercholesteremia  E78.00     5. Tobacco abuse counseling  Z71.6 nicotine (NICODERM CQ) 14 mg/24hr patch    nicotine (NICODERM CQ) 7 mg/24hr patch      Meds ordered this encounter  Medications   nicotine (NICODERM CQ) 14 mg/24hr patch    Sig:  Place 1 patch (14 mg total) onto the skin daily.    Dispense:  28 patch    Refill:  0   nicotine (NICODERM CQ) 7 mg/24hr patch    Sig: Place 1 patch (7 mg total) onto the skin daily. Please start after you have used up 40 mg dosage    Dispense:  28 patch    Refill:  1     Medications Discontinued During This Encounter  Medication Reason   albuterol (VENTOLIN HFA) 108 (90 Base) MCG/ACT inhaler Error    This patients CHA2DS2-VASc Score 5 (CHF, HTN, Vasc, TIA) and yearly risk of stroke 7.2%.   Recommendations:   Danny Kelley  is a  55 y.o. Caucasian male patient with history of cardiomyopathy with chronic systolic heart failure, hypertension, hyperlipidemia (statin intolerant), obstructive sleep apnea compliant with CPAP, tobacco use.  Patient has history of TIA in Feb 2018 while on anticoagulants, paroxysmal atrial fibrillation status post ablation 11/13/2016 and 03/01/2020 (Multaq and sotalol ineffective).  History of unstable angina pectoris on 11/26/2017, angioplasty to superdominant left circumflex coronary artery with 4.5 mm resolute DES in the midsegment, patent stent placed in Distal Cx and OM 1 in 2014, 03/22/2019, stenting to high-grade stenosis in the distal dominant Circumflex PDA branch. Cardiac catheterization on 01/24/2020 revealed no change in coronary anatomy, suspect his cardiomyopathy may have been related to underlying A. Fib.   Of note patient has previously been evaluated for syncope, which was felt to be psychogenic in view of his underlying history of seizure disorder. Patient is enrolled in  remote patient monitoring for hypertension.  Patient has been unable to tolerate statin therapy in the past due to severe myalgias including atorvastatin, rosuvastatin, and simvastatin. Patient had been on Repatha, but could not tolerate this due to severe leg cramps and leg pain.  Is presently only on Zetia.  We will repeat his lipids, I also repeat CRP to see whether he would be a  candidate for  "ZEUS" trial with Zltvekimab 15 mg SQ)  He underwent coronary angiography and found to have high-grade stenosis in the proximal and mid LAD and also involvement of D1 for which he underwent successful angioplasty and stenting with a 3.5 x 38 mm DES, Onyx frontier.  It was postdilated to 4 mm.  Attempted angioplasty to right coronary artery was left alone in view of severe coronary calcification.  In view of persistent symptoms of angina pectoris, we will go ahead and schedule him forPCI to the right coronary artery.  With regard to hypertension, blood pressure is well controlled.  He has started smoking again, I have discussed smoking cessation, smoking cessation along with counseling performed today for additional 10 minutes, his son is also present and also spoke to his wife over the phone and advised him not to smoke in the house to begin with.  I prescribed him nicotine patches.  Continue present medications, I will see him back in 4 to 6 weeks for follow-up.  He will continue to be enrolled in RPM and CPM in office for management of his hypertension and medical issues.   [name], MD, [facility] 08/30/2021, 3:03 PM Office: [phone] Fax: [phone] Pager: [phone]

## 2021-09-17 NOTE — Progress Notes (Signed)
Dr Virgina Jock called informed to complete med rec.

## 2021-09-17 NOTE — Discharge Instructions (Signed)
Radial Site Care  This sheet gives you information about how to care for yourself after your procedure. Your health care provider may also give you more specific instructions. If you have problems or questions, contact your health care provider. What can I expect after the procedure? After the procedure, it is common to have: Bruising and tenderness at the catheter insertion area. Follow these instructions at home: Medicines Take over-the-counter and prescription medicines only as told by your health care provider. Insertion site care Follow instructions from your health care provider about how to take care of your insertion site. Make sure you: Wash your hands with soap and water before you remove your bandage (dressing). If soap and water are not available, use hand sanitizer. May remove dressing in 24 hours. Check your insertion site every day for signs of infection. Check for: Redness, swelling, or pain. Fluid or blood. Pus or a bad smell. Warmth. Do no take baths, swim, or use a hot tub for 5 days. You may shower 24-48 hours after the procedure. Remove the dressing and gently wash the site with plain soap and water. Pat the area dry with a clean towel. Do not rub the site. That could cause bleeding. Do not apply powder or lotion to the site. Activity  For 24 hours after the procedure, or as directed by your health care provider: Do not flex or bend the affected arm. Do not push or pull heavy objects with the affected arm. Do not drive yourself home from the hospital or clinic. You may drive 24 hours after the procedure. Do not operate machinery or power tools. KEEP ARM ELEVATED THE REMAINDER OF THE DAY. Do not push, pull or lift anything that is heavier than 10 lb for 5 days. Ask your health care provider when it is okay to: Return to work or school. Resume usual physical activities or sports. Resume sexual activity. General instructions If the catheter site starts to  bleed, raise your arm and put firm pressure on the site. If the bleeding does not stop, get help right away. This is a medical emergency. DRINK PLENTY OF FLUIDS FOR THE NEXT 2-3 DAYS. No alcohol consumption for 24 hours after receiving sedation. If you went home on the same day as your procedure, a responsible adult should be with you for the first 24 hours after you arrive home. Keep all follow-up visits as told by your health care provider. This is important. Contact a health care provider if: You have a fever. You have redness, swelling, or yellow drainage around your insertion site. Get help right away if: You have unusual pain at the radial site. The catheter insertion area swells very fast. The insertion area is bleeding, and the bleeding does not stop when you hold steady pressure on the area. Your arm or hand becomes pale, cool, tingly, or numb. These symptoms may represent a serious problem that is an emergency. Do not wait to see if the symptoms will go away. Get medical help right away. Call your local emergency services (911 in the [place] Do not drive yourself to the hospital. Summary After the procedure, it is common to have bruising and tenderness at the site. Follow instructions from your health care provider about how to take care of your radial site wound. Check the wound every day for signs of infection.  This information is not intended to replace advice given to you by your health care provider. Make sure you discuss any questions you have with   your health care provider. Document Revised: 11/25/2017 Document Reviewed: 11/25/2017 Elsevier Patient Education  2020 [employer]  

## 2021-09-17 NOTE — Progress Notes (Signed)
CARDIAC REHAB PHASE I   Stent education completed with pt and son. Pt educated on importance of ASA and Plavix. Pt states he has a heart healthy diet, smoking cessation tip sheet, and exercise guidelines at home from stent done in October. Reviewed site care and restrictions. Will send CRP II referral to [facility].  [phone] Rufina Falco, RN BSN 09/17/2021 3:32 PM

## 2021-09-18 ENCOUNTER — Encounter (HOSPITAL_COMMUNITY): Payer: Self-pay | Admitting: Cardiology

## 2021-09-19 ENCOUNTER — Telehealth (HOSPITAL_COMMUNITY): Payer: Self-pay

## 2021-09-19 NOTE — Telephone Encounter (Signed)
Per phase I cardiac rehab, fax cardiac rehab referral to [facility]. °

## 2021-10-03 ENCOUNTER — Encounter (HOSPITAL_COMMUNITY): Payer: Self-pay | Admitting: Emergency Medicine

## 2021-10-03 ENCOUNTER — Other Ambulatory Visit: Payer: Self-pay

## 2021-10-03 ENCOUNTER — Emergency Department (HOSPITAL_COMMUNITY): Payer: Medicare Other

## 2021-10-03 ENCOUNTER — Emergency Department (HOSPITAL_COMMUNITY)
Admission: EM | Admit: 2021-10-03 | Discharge: 2021-10-04 | Disposition: A | Discharge status: Home / Self Care | Payer: Medicare Other | Attending: Emergency Medicine | Admitting: Emergency Medicine

## 2021-10-03 DIAGNOSIS — I639 Cerebral infarction, unspecified: Secondary | ICD-10-CM | POA: Diagnosis not present

## 2021-10-03 DIAGNOSIS — Z7901 Long term (current) use of anticoagulants: Secondary | ICD-10-CM | POA: Insufficient documentation

## 2021-10-03 DIAGNOSIS — R0902 Hypoxemia: Secondary | ICD-10-CM | POA: Diagnosis not present

## 2021-10-03 DIAGNOSIS — Z7902 Long term (current) use of antithrombotics/antiplatelets: Secondary | ICD-10-CM | POA: Diagnosis not present

## 2021-10-03 DIAGNOSIS — I499 Cardiac arrhythmia, unspecified: Secondary | ICD-10-CM | POA: Diagnosis not present

## 2021-10-03 DIAGNOSIS — I110 Hypertensive heart disease with heart failure: Secondary | ICD-10-CM | POA: Diagnosis not present

## 2021-10-03 DIAGNOSIS — Z79899 Other long term (current) drug therapy: Secondary | ICD-10-CM | POA: Insufficient documentation

## 2021-10-03 DIAGNOSIS — R202 Paresthesia of skin: Secondary | ICD-10-CM

## 2021-10-03 DIAGNOSIS — R519 Headache, unspecified: Secondary | ICD-10-CM | POA: Diagnosis not present

## 2021-10-03 DIAGNOSIS — R6889 Other general symptoms and signs: Secondary | ICD-10-CM | POA: Diagnosis not present

## 2021-10-03 DIAGNOSIS — R0789 Other chest pain: Secondary | ICD-10-CM | POA: Diagnosis not present

## 2021-10-03 DIAGNOSIS — I2510 Atherosclerotic heart disease of native coronary artery without angina pectoris: Secondary | ICD-10-CM | POA: Diagnosis not present

## 2021-10-03 DIAGNOSIS — I5032 Chronic diastolic (congestive) heart failure: Secondary | ICD-10-CM | POA: Diagnosis not present

## 2021-10-03 DIAGNOSIS — I6350 Cerebral infarction due to unspecified occlusion or stenosis of unspecified cerebral artery: Secondary | ICD-10-CM | POA: Diagnosis not present

## 2021-10-03 DIAGNOSIS — M542 Cervicalgia: Secondary | ICD-10-CM

## 2021-10-03 DIAGNOSIS — H547 Unspecified visual loss: Secondary | ICD-10-CM | POA: Diagnosis not present

## 2021-10-03 DIAGNOSIS — R9431 Abnormal electrocardiogram [ECG] [EKG]: Secondary | ICD-10-CM | POA: Diagnosis not present

## 2021-10-03 DIAGNOSIS — F17210 Nicotine dependence, cigarettes, uncomplicated: Secondary | ICD-10-CM | POA: Insufficient documentation

## 2021-10-03 DIAGNOSIS — Z743 Need for continuous supervision: Secondary | ICD-10-CM | POA: Diagnosis not present

## 2021-10-03 LAB — COMPREHENSIVE METABOLIC PANEL
ALT: 21 U/L (ref 0–44)
AST: 19 U/L (ref 15–41)
Albumin: 3.1 g/dL — ABNORMAL LOW (ref 3.5–5.0)
Alkaline Phosphatase: 106 U/L (ref 38–126)
Anion gap: 8 (ref 5–15)
BUN: 16 mg/dL (ref 6–20)
CO2: 25 mmol/L (ref 22–32)
Calcium: 9.5 mg/dL (ref 8.9–10.3)
Chloride: 104 mmol/L (ref 98–111)
Creatinine, Ser: 0.79 mg/dL (ref 0.61–1.24)
GFR, Estimated: >60 mL/min (ref >60)
Glucose, Bld: 123 mg/dL — ABNORMAL HIGH (ref 70–99)
Potassium: 3.4 mmol/L — ABNORMAL LOW (ref 3.5–5.1)
Sodium: 137 mmol/L (ref 135–145)
Total Bilirubin: 0.2 mg/dL — ABNORMAL LOW (ref 0.3–1.2)
Total Protein: 6.8 g/dL (ref 6.5–8.1)

## 2021-10-03 LAB — CBC WITH DIFFERENTIAL/PLATELET
Abs Immature Granulocytes: 0.06 10*3/uL (ref 0.00–0.07)
Basophils Absolute: 0.1 10*3/uL (ref 0.0–0.1)
Basophils Relative: 1 %
Eosinophils Absolute: 0.2 10*3/uL (ref 0.0–0.5)
Eosinophils Relative: 2 %
HCT: 46.9 % (ref 39.0–52.0)
Hemoglobin: 16.1 g/dL (ref 13.0–17.0)
Immature Granulocytes: 1 %
Lymphocytes Relative: 25 %
Lymphs Abs: 2.8 10*3/uL (ref 0.7–4.0)
MCH: 29.1 pg (ref 26.0–34.0)
MCHC: 34.3 g/dL (ref 30.0–36.0)
MCV: 84.7 fL (ref 80.0–100.0)
Monocytes Absolute: 0.9 10*3/uL (ref 0.1–1.0)
Monocytes Relative: 8 %
Neutro Abs: 7.1 10*3/uL (ref 1.7–7.7)
Neutrophils Relative %: 63 %
Platelets: 312 10*3/uL (ref 150–400)
RBC: 5.54 MIL/uL (ref 4.22–5.81)
RDW: 13.5 % (ref 11.5–15.5)
WBC: 11 10*3/uL — ABNORMAL HIGH (ref 4.0–10.5)
nRBC: 0 % (ref 0.0–0.2)

## 2021-10-03 LAB — PROTIME-INR
INR: 1 (ref 0.8–1.2)
Prothrombin Time: 12.8 seconds (ref 11.4–15.2)

## 2021-10-03 MED ORDER — IOHEXOL 350 MG/ML SOLN
75.0000 mL | Freq: Once | INTRAVENOUS | Status: AC | PRN
  Administered 2021-10-03: 75 mL via INTRAVENOUS

## 2021-10-03 MED ORDER — METOCLOPRAMIDE HCL 5 MG/ML IJ SOLN
10.0000 mg | Freq: Once | INTRAMUSCULAR | Status: AC
  Administered 2021-10-03: 10 mg via INTRAVENOUS
  Filled 2021-10-03: qty 2

## 2021-10-03 MED ORDER — KETOROLAC TROMETHAMINE 30 MG/ML IJ SOLN
30.0000 mg | Freq: Once | INTRAMUSCULAR | Status: AC
  Administered 2021-10-03: 30 mg via INTRAVENOUS
  Filled 2021-10-03: qty 1

## 2021-10-03 MED ORDER — DIPHENHYDRAMINE HCL 50 MG/ML IJ SOLN
25.0000 mg | Freq: Once | INTRAMUSCULAR | Status: AC
  Administered 2021-10-04: 25 mg via INTRAVENOUS
  Filled 2021-10-03: qty 1

## 2021-10-03 NOTE — ED Triage Notes (Signed)
Patient complains of intermittent stabbing pain in posterior lower right side of his head accompanied by headaches. When headache occurs patient reports left sided numbness and blurred vision. EMS reports patient had a cardiac stent placed 15 days ago. Patient alert, oriented, and in no apparent distress at this time.

## 2021-10-03 NOTE — ED Provider Notes (Signed)
Emergency Medicine Provider Triage Evaluation Note  Danny Kelley , a 55 y.o. male  was evaluated in triage.  Pt complains of random stabbing pain in the posterior right side of his neck, also endorsing some left peripheral vision loss that occurred 2 days ago.  Describes it as feeling different than his prior to his strokes.  He reports feeling some leg and arm numbness accompanied by this feeling like "both of them are sleep ", has not taken any medication for improvement in symptoms.  Review of Systems  Positive: Neck pain, headache, left vision loss Negative: Nausea. Vomiting, trauma  Physical Exam  BP (!) 123/93 (BP Location: Right Arm)   Pulse 81   Temp 97.8 F (36.6 C) (Oral)   Resp 20   SpO2 97%  Gen:   Awake, no distress   Resp:  Normal effort  MSK:   Moves extremities without difficulty  Other:  Pain with palpation along the posterior right side of his neck.  Moves all upper and lower extremities, with equal sensation throughout.  Medical Decision Making  Medically screening exam initiated at 9:01 PM.  Appropriate orders placed.  [name] was informed that the remainder of the evaluation will be completed by another provider, this initial triage assessment does not replace that evaluation, and the importance of remaining in the ED until their evaluation is complete.  Patient here with concerning headache, neck pain, along with left peripheral vision loss.  Vitals are within normal limits.  Does have a prior history of prior strokes.   Janeece Fitting, PA-C 10/03/21 [id number]    Daleen Bo, MD 10/03/21 [phone]

## 2021-10-03 NOTE — ED Provider Notes (Signed)
[facility] EMERGENCY DEPARTMENT Provider Note   CSN: [id number] Arrival date & time: 10/03/21  1833     History Chief Complaint  Patient presents with   Headache    Danny Kelley is a 55 y.o. male.  Patient with a history of MI status post stents, atrial fibrillation on Eliquis, previous TIAs presenting with pain to the right base of his skull for the past 2 days.  States he woke 2 days ago with a sudden pain to his right occipital scalp and neck area.  Pain was quite severe at that time.  He also noticed left-sided numbness involving his face, arm and leg that resolved after approximately 10 minutes.  It has not returned.  However he has had ongoing base of the neck and skull pain since since then waxing and waning in severity.  Taking Tylenol with some relief.  Pain became severe again this evening and he went to urgent care who referred him to the ED.  States this afternoon he lost peripheral vision in his left eye for several hours but is now back to baseline.  He has had no further weakness or numbness in his arms or legs.  No difficulty speaking or difficulty swallowing.  He has never had this pain before.  Does take Plavix and Eliquis.  Denies any fall or trauma.  He states his vision is back to baseline now.  He has no chest pain or shortness of breath though he did have a stent placed 2 weeks ago. He denies headache he denies dizziness or room spinning.  He denies difficulty speaking or difficulty swallowing.  He states he is having no spots in his vision and his peripheral vision has returned.  The history is provided by the patient.  Headache Associated symptoms: numbness   Associated symptoms: no abdominal pain, no congestion, no cough, no dizziness, no fatigue, no fever, no myalgias, no nausea, no vomiting and no weakness       Past Medical History:  Diagnosis Date   Arthritis    "a little in my right knee" (07/14/2016)   CAD (coronary artery disease)     Chronic congestive heart failure with left ventricular diastolic dysfunction (HCC)    Complication of anesthesia    Pt reports "they have a hard time waking me up"   GERD (gastroesophageal reflux disease)    hx   Headache    "with every heart issue that I have" (07/14/2016)   High cholesterol    History of hiatal hernia 1990s   "fixed it w/scope down my throat"   Hypertension    Myocardial infarction ([place]) 05/2013   stents: left PDA, 1st OM at [facility]   Obesity    Persistent atrial fibrillation ([place])    Pneumonia ~ 1992   QT prolongation 07/15/2016   Seasonal allergies    "I take Allegra prn" (07/14/2016)   Tobacco abuse 12/16/2016    Patient Active Problem List   Diagnosis Date Noted   Post PTCA 08/13/2021   Colon cancer screening 02/24/2021   Shortness of breath 02/24/2021   Atypical chest pain 02/24/2021   Hiatal hernia 02/24/2021   Gastroesophageal reflux disease 02/24/2021   Chronic anticoagulation 02/24/2021   Secondary hypercoagulable state ([place]) 03/12/2020   Elevated hematocrit 02/01/2020   CAD (coronary artery disease), native coronary artery 09/20/2018   Unstable angina ([place]) 11/27/2017   Angina pectoris ([place]) 11/27/2017   Cerebrovascular accident (CVA) ([place]) 12/16/2016   Hypokalemia 12/16/2016  Mixed hyperlipidemia    OSA (obstructive sleep apnea)    Morbid obesity (HCC)    Hemispheric carotid artery syndrome    Persistent atrial fibrillation ([place]) 11/13/2016   Near syncope 07/14/2016   Coronary artery disease due to lipid rich plaque 07/14/2016   Essential hypertension 07/14/2016   Chronic diastolic CHF (congestive heart failure) ([place]) 07/14/2016   Paroxysmal atrial fibrillation ([place]) 06/09/2016    Past Surgical History:  Procedure Laterality Date   APPENDECTOMY  1984   ATRIAL FIBRILLATION ABLATION N/A 03/01/2020   Procedure: ATRIAL FIBRILLATION ABLATION;  Surgeon: [name], MD;  Location: [facility] CV LAB;  Service: Cardiovascular;   Laterality: N/A;   CARDIOVERSION N/A 05/27/2016   Procedure: CARDIOVERSION;  Surgeon: [name], MD;  Location: [facility] ENDOSCOPY;  Service: Cardiovascular;  Laterality: N/A;   CARDIOVERSION N/A 06/11/2016   Procedure: CARDIOVERSION;  Surgeon: [name], MD;  Location: [facility];  Service: Cardiovascular;  Laterality: N/A;   CARDIOVERSION N/A 03/21/2020   Procedure: CARDIOVERSION;  Surgeon: Geralynn Rile, MD;  Location: [facility];  Service: Cardiovascular;  Laterality: N/A;   CORONARY ANGIOPLASTY WITH STENT PLACEMENT  05/30/2013   "2 stents put in at [facility]" & /stent card   CORONARY STENT INTERVENTION N/A 11/27/2017   Procedure: CORONARY STENT INTERVENTION;  Surgeon: [name], MD;  Location: [place] CV LAB;  Service: Cardiovascular;  Laterality: N/A;   CORONARY STENT INTERVENTION N/A 03/21/2019   Procedure: CORONARY STENT INTERVENTION;  Surgeon: [name], MD;  Location: [place] CV LAB;  Service: Cardiovascular;  Laterality: N/A;   CORONARY STENT INTERVENTION N/A 08/13/2021   Procedure: CORONARY STENT INTERVENTION;  Surgeon: Nigel Mormon, MD;  Location: [place] CV LAB;  Service: Cardiovascular;  Laterality: N/A;   CORONARY STENT INTERVENTION N/A 09/17/2021   Procedure: CORONARY STENT INTERVENTION;  Surgeon: Nigel Mormon, MD;  Location: [place] CV LAB;  Service: Cardiovascular;  Laterality: N/A;   ELECTROPHYSIOLOGIC STUDY N/A 11/13/2016   Procedure: Atrial Fibrillation Ablation;  Surgeon: [name], MD;  Location: [place] CV LAB;  Service: Cardiovascular;  Laterality: N/A;   ESOPHAGOGASTRODUODENOSCOPY (EGD) WITH ESOPHAGEAL DILATION  1990s X 2   INTRAVASCULAR IMAGING/OCT N/A 09/17/2021   Procedure: INTRAVASCULAR IMAGING/OCT;  Surgeon: Nigel Mormon, MD;  Location: [place] CV LAB;  Service: Cardiovascular;  Laterality: N/A;   INTRAVASCULAR PRESSURE WIRE/FFR STUDY N/A 09/21/2018   Procedure: INTRAVASCULAR PRESSURE WIRE/FFR STUDY;  Surgeon:  [name], MD;  Location: [place] CV LAB;  Service: Cardiovascular;  Laterality: N/A;   INTRAVASCULAR PRESSURE WIRE/FFR STUDY N/A 03/21/2019   Procedure: INTRAVASCULAR PRESSURE WIRE/FFR STUDY;  Surgeon: [name], MD;  Location: [place] CV LAB;  Service: Cardiovascular;  Laterality: N/A;   INTRAVASCULAR PRESSURE WIRE/FFR STUDY N/A 01/24/2020   Procedure: INTRAVASCULAR PRESSURE WIRE/FFR STUDY;  Surgeon: [name], MD;  Location: [place] CV LAB;  Service: Cardiovascular;  Laterality: N/A;   INTRAVASCULAR ULTRASOUND/IVUS N/A 08/13/2021   Procedure: Intravascular Ultrasound/IVUS;  Surgeon: Nigel Mormon, MD;  Location: [place] CV LAB;  Service: Cardiovascular;  Laterality: N/A;   KNEE ARTHROSCOPY Right 1986; ~ 1995 X [place]  2015   LEFT HEART CATH AND CORONARY ANGIOGRAPHY N/A 11/27/2017   Procedure: LEFT HEART CATH AND CORONARY ANGIOGRAPHY;  Surgeon: [name], MD;  Location: [place] CV LAB;  Service: Cardiovascular;  Laterality: N/A;   LEFT HEART CATH AND CORONARY ANGIOGRAPHY N/A 09/21/2018   Procedure: LEFT HEART CATH AND CORONARY ANGIOGRAPHY;  Surgeon: [name],  MD;  Location: [place] CV LAB;  Service: Cardiovascular;  Laterality: N/A;   LEFT HEART CATH AND CORONARY ANGIOGRAPHY N/A 03/21/2019   Procedure: LEFT HEART CATH AND CORONARY ANGIOGRAPHY;  Surgeon: [name], MD;  Location: [place] CV LAB;  Service: Cardiovascular;  Laterality: N/A;   REPAIR OF ESOPHAGUS  1998   perforation of the distal esophagus from bad heartburn   RIGHT/LEFT HEART CATH AND CORONARY ANGIOGRAPHY N/A 01/24/2020   Procedure: RIGHT/LEFT HEART CATH AND CORONARY ANGIOGRAPHY;  Surgeon: [name], MD;  Location: [place] CV LAB;  Service: Cardiovascular;  Laterality: N/A;   RIGHT/LEFT HEART CATH AND CORONARY ANGIOGRAPHY N/A 08/13/2021   Procedure: RIGHT/LEFT HEART CATH AND CORONARY ANGIOGRAPHY;  Surgeon: Nigel Mormon, MD;  Location: [place] CV LAB;   Service: Cardiovascular;  Laterality: N/A;   TEE WITHOUT CARDIOVERSION N/A 11/13/2016   Procedure: TRANSESOPHAGEAL ECHOCARDIOGRAM (TEE);  Surgeon: Thayer Headings, MD;  Location[facility] ENDOSCOPY;  Service: Cardiovascular;  Laterality: N/A;       Family History  Problem Relation Age of Onset   Heart disease Mother 12       CABG age 71   Breast cancer Mother    Tongue cancer Mother    Diabetes Mother    Heart attack Father 48       Father had around 7 heart attacks per pt   Diabetes Father    Lung cancer Father    Congestive Heart Failure Father    Lung cancer Paternal Uncle    Colon cancer Neg Hx    Esophageal cancer Neg Hx    Inflammatory bowel disease Neg Hx    Liver disease Neg Hx    Pancreatic cancer Neg Hx    Rectal cancer Neg Hx    Stomach cancer Neg Hx     Social History   Tobacco Use   Smoking status: Every Day    Packs/day: 0.25    Years: 25.00    Pack years: 6.25    Types: Cigarettes    Start date: 1989   Smokeless tobacco: Never  Vaping Use   Vaping Use: Never used  Substance Use Topics   Alcohol use: No   Drug use: No    Home Medications Prior to Admission medications   Medication Sig Start Date End Date Taking? Authorizing Provider  acetaminophen (TYLENOL) 500 MG tablet Take 500 mg by mouth 2 (two) times daily as needed for moderate pain.    [provider]  Alirocumab (PRALUENT) 75 MG/ML SOAJ Inject 75 mg into the skin every 14 (fourteen) days. 09/03/21   [name], MD  amLODipine (NORVASC) 10 MG tablet Take 1 tablet (10 mg total) by mouth daily. 09/13/21   [name], MD  apixaban (ELIQUIS) 5 MG TABS tablet Take 1 tablet (5 mg total) by mouth 2 (two) times daily. 09/17/21   Patwardhan, Reynold Bowen, MD  bisacodyl (DULCOLAX) 5 MG EC tablet Take 5 mg by mouth daily as needed.    [provider]  clopidogrel (PLAVIX) 75 MG tablet Take 1 tablet (75 mg total) by mouth daily. 09/10/21   [name], MD  docusate sodium (COLACE) 100 MG capsule  Take 100 mg by mouth 2 (two) times daily as needed.    [provider]  ezetimibe (ZETIA) 10 MG tablet Take 1 tablet (10 mg total) by mouth daily. 03/18/21   Cantwell, Celeste C, PA-C  fexofenadine (ALLEGRA) 180 MG tablet Take 180 mg by mouth daily as needed (seasonal allergies).  [provider]  hydrALAZINE (APRESOLINE) 50 MG tablet With additional dose as needed for BP >150/90 mmHg. 09/16/21   [name], MD  metoprolol tartrate (LOPRESSOR) 100 MG tablet Take 1 tablet (100 mg total) by mouth 2 (two) times daily. 09/13/21   [name], MD  nicotine (NICODERM CQ) 14 mg/24hr patch Place 1 patch (14 mg total) onto the skin daily. 08/30/21   [name], MD  nicotine (NICODERM CQ) 7 mg/24hr patch Place 1 patch (7 mg total) onto the skin daily. Please start after you have used up 40 mg dosage 08/30/21   [name], MD  nitroGLYCERIN (NITROSTAT) 0.4 MG SL tablet Place 1 tablet (0.4 mg total) under the tongue every 5 (five) minutes as needed for chest pain. 09/13/21   [name], MD  pantoprazole (PROTONIX) 20 MG tablet Take 1 tablet (20 mg total) by mouth 2 (two) times daily. 04/12/21   [name], MD  sacubitril-valsartan (ENTRESTO) 97-103 MG Take 1 tablet by mouth 2 (two) times daily. 04/12/21   [name], MD    Allergies    Codeine, Crestor [rosuvastatin], Albuterol, Dilaudid [hydromorphone], Isosorbide, Lexapro [escitalopram], Lipitor [atorvastatin calcium], Erythromycin, and Oxycodone  Review of Systems   Review of Systems  Constitutional:  Negative for appetite change, fatigue and fever.  HENT:  Negative for congestion and rhinorrhea.   Eyes:  Positive for visual disturbance.  Respiratory:  Negative for cough, chest tightness and shortness of breath.   Cardiovascular:  Negative for chest pain.  Gastrointestinal:  Negative for abdominal pain, nausea and vomiting.  Genitourinary:  Negative for dysuria and hematuria.  Musculoskeletal:  Negative for arthralgias and myalgias.   Skin:  Negative for rash.  Neurological:  Positive for numbness and headaches. Negative for dizziness, speech difficulty, weakness and light-headedness.   all other systems are negative except as noted in the HPI and PMH.   Physical Exam Updated Vital Signs BP 134/89   Pulse 72   Temp 97.8 F (36.6 C) (Oral)   Resp 18   SpO2 99%   Physical Exam Vitals and nursing note reviewed.  Constitutional:      General: He is not in acute distress.    Appearance: He is well-developed.  HENT:     Head: Normocephalic and atraumatic.     Mouth/Throat:     Pharynx: No oropharyngeal exudate.  Eyes:     Conjunctiva/sclera: Conjunctivae normal.     Pupils: Pupils are equal, round, and reactive to light.  Neck:     Comments: No meningismus. Discrete tenderness to base of right skull at occiput.  No overlying skin changes.  No meningismus.  No paraspinal or midline neck pain Cardiovascular:     Rate and Rhythm: Normal rate and regular rhythm.     Heart sounds: Normal heart sounds. No murmur heard. Pulmonary:     Effort: Pulmonary effort is normal. No respiratory distress.     Breath sounds: Normal breath sounds.  Abdominal:     Palpations: Abdomen is soft.     Tenderness: There is no abdominal tenderness. There is no guarding or rebound.  Musculoskeletal:        General: No tenderness. Normal range of motion.     Cervical back: Normal range of motion and neck supple.  Skin:    General: Skin is warm.  Neurological:     Mental Status: He is alert and oriented to person, place, and time.     Cranial Nerves: No cranial nerve deficit.  Motor: No abnormal muscle tone.     Coordination: Coordination normal.     Comments: CN 2-12 intact, no ataxia on finger to nose, no nystagmus, 5/5 strength throughout, no pronator drift, Romberg negative, normal gait.   Psychiatric:        Behavior: Behavior normal.    ED Results / Procedures / Treatments   Labs (all labs ordered are listed, but only  abnormal results are displayed) Labs Reviewed  CBC WITH DIFFERENTIAL/PLATELET - Abnormal; Notable for the following components:      Result Value   WBC 11.0 (*)    All other components within normal limits  COMPREHENSIVE METABOLIC PANEL - Abnormal; Notable for the following components:   Potassium 3.4 (*)    Glucose, Bld 123 (*)    Albumin 3.1 (*)    Total Bilirubin 0.2 (*)    All other components within normal limits  PROTIME-INR  TROPONIN I (HIGH SENSITIVITY)  TROPONIN I (HIGH SENSITIVITY)    EKG EKG Interpretation  Date/Time:  Thursday October 03 2021 23:46:57 EST Ventricular Rate:  80 PR Interval:  149 QRS Duration: 104 QT Interval:  413 QTC Calculation: 477 R Axis:   31 Text Interpretation: Sinus rhythm Borderline prolonged QT interval No significant change was found Confirmed by Ezequiel Essex [phone]) on 10/03/2021 11:53:51 PM  Radiology CT ANGIO HEAD NECK W WO CM  Result Date: 10/03/2021 CLINICAL DATA:  Monocular vision loss EXAM: CT ANGIOGRAPHY HEAD AND NECK TECHNIQUE: Multidetector CT imaging of the head and neck was performed using the standard protocol during bolus administration of intravenous contrast. Multiplanar CT image reconstructions and MIPs were obtained to evaluate the vascular anatomy. Carotid stenosis measurements (when applicable) are obtained utilizing NASCET criteria, using the distal internal carotid diameter as the denominator. CONTRAST:  52mL OMNIPAQUE IOHEXOL 350 MG/ML SOLN COMPARISON:  05/16/2021 FINDINGS: CT HEAD FINDINGS Brain: There is no mass, hemorrhage or extra-axial collection. The size and configuration of the ventricles and extra-axial CSF spaces are normal. There is no acute or chronic infarction. The brain parenchyma is normal. Skull: The visualized skull base, calvarium and extracranial soft tissues are normal. Sinuses/Orbits: No fluid levels or advanced mucosal thickening of the visualized paranasal sinuses. No mastoid or middle ear  effusion. The orbits are normal. CTA NECK FINDINGS SKELETON: There is no bony spinal canal stenosis. No lytic or blastic lesion. OTHER NECK: Normal pharynx, larynx and major salivary glands. No cervical lymphadenopathy. Unremarkable thyroid gland. UPPER CHEST: No pneumothorax or pleural effusion. No nodules or masses. AORTIC ARCH: There is no calcific atherosclerosis of the aortic arch. There is no aneurysm, dissection or hemodynamically significant stenosis of the visualized portion of the aorta. Conventional 3 vessel aortic branching pattern. The visualized proximal subclavian arteries are widely patent. RIGHT CAROTID SYSTEM: Normal without aneurysm, dissection or stenosis. LEFT CAROTID SYSTEM: Normal without aneurysm, dissection or stenosis. VERTEBRAL ARTERIES: Right dominant configuration. Both origins are clearly patent. There is no dissection, occlusion or flow-limiting stenosis to the skull base (V1-V3 segments). CTA HEAD FINDINGS POSTERIOR CIRCULATION: --Vertebral arteries: Normal V4 segments. --Inferior cerebellar arteries: Normal. --Basilar artery: Normal. --Superior cerebellar arteries: Normal. --Posterior cerebral arteries (PCA): Normal. ANTERIOR CIRCULATION: --Intracranial internal carotid arteries: Normal. --Anterior cerebral arteries (ACA): Normal. Both A1 segments are present. Patent anterior communicating artery (a-comm). --Middle cerebral arteries (MCA): Normal. VENOUS SINUSES: As permitted by contrast timing, patent. ANATOMIC VARIANTS: None Review of the MIP images confirms the above findings. IMPRESSION: Normal CTA of the head and neck. Electronically Signed   By:  Ulyses Jarred M.D.   On: 10/03/2021 23:20    Procedures Procedures   Medications Ordered in ED Medications  metoCLOPramide (REGLAN) injection 10 mg (has no administration in time range)  diphenhydrAMINE (BENADRYL) injection 25 mg (has no administration in time range)  ketorolac (TORADOL) 30 MG/ML injection 30 mg (has no  administration in time range)  iohexol (OMNIPAQUE) 350 MG/ML injection 75 mL (75 mLs Intravenous Contrast Given 10/03/21 2256)    ED Course  I have reviewed the triage vital signs and the nursing notes.  Pertinent labs & imaging results that were available during my care of the patient were reviewed by me and considered in my medical decision making (see chart for details).    MDM Rules/Calculators/A&P                          Pain at base of skull on the right side for the past few days.  No trauma.  Did have brief episode of left-sided numbness 2 days ago that resolved after 10 minutes.  Loss of peripheral vision left eye today for several hours but now back to baseline.  Neurological exam is nonfocal currently.  CTA obtained in triage shows no aneurysms or dissections or hemorrhage.  Consider occipital neuralgia although this would not explain his left-sided numbness and visual loss  Patient given IV Reglan, Toradol and Benadryl.  Discussed with neurology Dr. Rory Percy.  He agrees with MRI.  Occipital neuralgia is possible but does not explain patient's left-sided numbness and visual loss.  His neuro exam is back to baseline.  Dr. Rory Percy feels if MRI is normal patient can follow-up as outpatient with neurology.  He is already on Eliquis and Plavix  Patient states he feels better after receiving Toradol, Reglan and Benadryl and is pain-free.  He is requesting to leave. Discussed that MRI is still recommended to ensure no new stroke or other pathology.  Patient initially agreeable to wait for MRI and was next on list.  However patient and family decided to leave prior to MRI. Nursing staff signed out patient AMA.  I was not able to speak with patient before he left. He had capacity to make his own decisions. Final Clinical Impression(s) / ED Diagnoses Final diagnoses:  None    Rx / DC Orders ED Discharge Orders     None        Rekita Miotke, Annie Main, MD 10/04/21 [phone]

## 2021-10-04 ENCOUNTER — Emergency Department (HOSPITAL_COMMUNITY): Payer: Medicare Other

## 2021-10-04 DIAGNOSIS — R519 Headache, unspecified: Secondary | ICD-10-CM | POA: Diagnosis not present

## 2021-10-04 LAB — TROPONIN I (HIGH SENSITIVITY): Troponin I (High Sensitivity): 7 ng/L (ref <18)

## 2021-10-04 MED ORDER — LORAZEPAM 1 MG PO TABS: 1.0000 mg | ORAL_TABLET | Freq: Once | ORAL | Status: DC

## 2021-10-04 NOTE — ED Notes (Signed)
Patient and visitor requesting to leave AMA. Provider made aware but not available. Patient is A+Ox4 and able to make own decisions. Patient ambulates independently with a steady gate. Patient signed electronic AMA form. Patient verbalizes understanding of risks of leaving and benefits of staying.

## 2021-10-04 NOTE — ED Notes (Signed)
Patient visitor again approaching this RN requesting to leave. This RN discussed with patient and visitor that this RN had called MRI and MRI assured patient would be next. Patient still requesting "d/c papers". Patient made aware that he has not been discharged and cannot be d/c until after MRI is complete, otherwise patient would be leaving AMA. Provider made aware.

## 2021-10-04 NOTE — ED Notes (Signed)
Patients son approached this RN. Patient states he feels better, all symptoms are gone and he would like to leave and not receive MRI. Provider made aware. Patient agreeable to staying at this time.

## 2021-10-08 DIAGNOSIS — Z8673 Personal history of transient ischemic attack (TIA), and cerebral infarction without residual deficits: Secondary | ICD-10-CM | POA: Diagnosis not present

## 2021-10-08 DIAGNOSIS — R42 Dizziness and giddiness: Secondary | ICD-10-CM | POA: Diagnosis not present

## 2021-10-08 DIAGNOSIS — I10 Essential (primary) hypertension: Secondary | ICD-10-CM | POA: Diagnosis not present

## 2021-10-08 DIAGNOSIS — M542 Cervicalgia: Secondary | ICD-10-CM | POA: Diagnosis not present

## 2021-10-10 DIAGNOSIS — H53453 Other localized visual field defect, bilateral: Secondary | ICD-10-CM | POA: Diagnosis not present

## 2021-10-14 ENCOUNTER — Ambulatory Visit: Payer: Medicare Other | Admitting: Cardiology

## 2021-10-23 ENCOUNTER — Other Ambulatory Visit: Payer: Self-pay | Admitting: Cardiology

## 2021-10-23 ENCOUNTER — Other Ambulatory Visit (INDEPENDENT_AMBULATORY_CARE_PROVIDER_SITE_OTHER): Payer: Self-pay | Admitting: Cardiology

## 2021-10-23 DIAGNOSIS — I10 Essential (primary) hypertension: Secondary | ICD-10-CM

## 2021-10-23 DIAGNOSIS — Z716 Tobacco abuse counseling: Secondary | ICD-10-CM

## 2021-10-24 MED ORDER — APIXABAN 5 MG PO TABS: 5.0000 mg | ORAL_TABLET | Freq: Two times a day (BID) | ORAL | 0 refills | Status: DC

## 2021-10-24 MED ORDER — AMLODIPINE BESYLATE 10 MG PO TABS: 10.0000 mg | ORAL_TABLET | Freq: Every day | ORAL | 0 refills | Status: DC

## 2021-10-24 MED ORDER — NICOTINE 14 MG/24HR TD PT24: 14.0000 mg | MEDICATED_PATCH | TRANSDERMAL | 0 refills | Status: DC

## 2021-11-10 NOTE — Progress Notes (Signed)
Cardiology Office Note:    Date:  11/12/2021   ID:  Danny Kelley, DOB 12/28/1965, MRN [id number]  PCP:  Angelina Sheriff, MD  Cardiologist:  Sinclair Grooms, MD   Referring MD: Angelina Sheriff, MD   Chief Complaint  Patient presents with   Coronary Artery Disease   Atrial Fibrillation   Hypertension   Hyperlipidemia    History of Present Illness:    Danny Kelley is a 56 y.o. male with a hx of chronic systolic heart failure, hypertension, hyperlipidemia (statin intolerant), obstructive sleep apnea compliant with CPAP, tobacco use.  Patient has history of TIA in Feb 2018 while on anticoagulants, paroxysmal atrial fibrillation status post ablation 11/13/2016 and 03/01/2020 (Multaq and sotalol ineffective). Circumflex stents 2014 and 2019. Last cath 2021, unchanged.    Long pre-existing relationship with Dr. Einar Gip.  States that he is switching because he does not feel he is being heard.  I reviewed most recent PCI procedures on the right coronary and LAD.  They all had beautiful results.  He has had mixed systolic and diastolic heart failure, he is on an excellent regimen, and I believe has had improvement in LV systolic function based upon review of data.  He is compliant with medical regimen.  He denies angina.  He remains active.  His major complaint is that he feels tired all the time.  He wants to sleep all the time.  He wonders if his medications have anything to do with it.  He is on Praluent and questions whether that could be an issue with his sleep.  Past Medical History:  Diagnosis Date   Arthritis    "a little in my right knee" (07/14/2016)   CAD (coronary artery disease)    Chronic congestive heart failure with left ventricular diastolic dysfunction (HCC)    Complication of anesthesia    Pt reports "they have a hard time waking me up"   GERD (gastroesophageal reflux disease)    hx   Headache    "with every heart issue that I have" (07/14/2016)   High  cholesterol    History of hiatal hernia 1990s   "fixed it w/scope down my throat"   Hypertension    Myocardial infarction ([place]) 05/2013   stents: left PDA, 1st OM at [facility]   Obesity    Persistent atrial fibrillation ([place])    Pneumonia ~ 1992   QT prolongation 07/15/2016   Seasonal allergies    "I take Allegra prn" (07/14/2016)   Tobacco abuse 12/16/2016    Past Surgical History:  Procedure Laterality Date   APPENDECTOMY  1984   ATRIAL FIBRILLATION ABLATION N/A 03/01/2020   Procedure: ATRIAL FIBRILLATION ABLATION;  Surgeon: [name], MD;  Location: [place] [facility];  Service: Cardiovascular;  Laterality: N/A;   CARDIOVERSION N/A 05/27/2016   Procedure: CARDIOVERSION;  Surgeon: [name], MD;  Location: [facility] ENDOSCOPY;  Service: Cardiovascular;  Laterality: N/A;   CARDIOVERSION N/A 06/11/2016   Procedure: CARDIOVERSION;  Surgeon: [name], MD;  Location: [place];  Service: Cardiovascular;  Laterality: N/A;   CARDIOVERSION N/A 03/21/2020   Procedure: CARDIOVERSION;  Surgeon: Geralynn Rile, MD;  Location: [place];  Service: Cardiovascular;  Laterality: N/A;   CORONARY ANGIOPLASTY WITH STENT PLACEMENT  05/30/2013   "2 stents put in at [facility]" & /stent card   CORONARY STENT INTERVENTION N/A 11/27/2017   Procedure: CORONARY STENT INTERVENTION;  Surgeon: [name], MD;  Location: [place]  CV LAB;  Service: Cardiovascular;  Laterality: N/A;   CORONARY STENT INTERVENTION N/A 03/21/2019   Procedure: CORONARY STENT INTERVENTION;  Surgeon: [name], MD;  Location: [place] [facility];  Service: Cardiovascular;  Laterality: N/A;   CORONARY STENT INTERVENTION N/A 08/13/2021   Procedure: CORONARY STENT INTERVENTION;  Surgeon: Nigel Mormon, MD;  Location: [place] [facility];  Service: Cardiovascular;  Laterality: N/A;   CORONARY STENT INTERVENTION N/A 09/17/2021   Procedure: CORONARY STENT INTERVENTION;  Surgeon: Nigel Mormon, MD;  Location: [place] [facility];  Service: Cardiovascular;  Laterality: N/A;   ELECTROPHYSIOLOGIC STUDY N/A 11/13/2016   Procedure: Atrial Fibrillation Ablation;  Surgeon: [name], MD;  Location: [place] [facility];  Service: Cardiovascular;  Laterality: N/A;   ESOPHAGOGASTRODUODENOSCOPY (EGD) WITH ESOPHAGEAL DILATION  1990s X 2   INTRAVASCULAR IMAGING/OCT N/A 09/17/2021   Procedure: INTRAVASCULAR IMAGING/OCT;  Surgeon: Nigel Mormon, MD;  Location: [place] [facility];  Service: Cardiovascular;  Laterality: N/A;   INTRAVASCULAR PRESSURE WIRE/FFR STUDY N/A 09/21/2018   Procedure: INTRAVASCULAR PRESSURE WIRE/FFR STUDY;  Surgeon: [name], MD;  Location: [place] [facility];  Service: Cardiovascular;  Laterality: N/A;   INTRAVASCULAR PRESSURE WIRE/FFR STUDY N/A 03/21/2019   Procedure: INTRAVASCULAR PRESSURE WIRE/FFR STUDY;  Surgeon: [name], MD;  Location: [place] [facility];  Service: Cardiovascular;  Laterality: N/A;   INTRAVASCULAR PRESSURE WIRE/FFR STUDY N/A 01/24/2020   Procedure: INTRAVASCULAR PRESSURE WIRE/FFR STUDY;  Surgeon: [name], MD;  Location: [place] [facility];  Service: Cardiovascular;  Laterality: N/A;   INTRAVASCULAR ULTRASOUND/IVUS N/A 08/13/2021   Procedure: Intravascular Ultrasound/IVUS;  Surgeon: Nigel Mormon, MD;  Location: [place] [facility];  Service: Cardiovascular;  Laterality: N/A;   KNEE ARTHROSCOPY Right 1986; ~ 1995 X [place]  2015   LEFT HEART CATH AND CORONARY ANGIOGRAPHY N/A 11/27/2017   Procedure: LEFT HEART CATH AND CORONARY ANGIOGRAPHY;  Surgeon: [name], MD;  Location: [place] [facility];  Service: Cardiovascular;  Laterality: N/A;   LEFT HEART CATH AND CORONARY ANGIOGRAPHY N/A 09/21/2018   Procedure: LEFT HEART CATH AND CORONARY ANGIOGRAPHY;  Surgeon: [name], MD;  Location: [place] [facility];  Service: Cardiovascular;  Laterality: N/A;   LEFT HEART CATH AND CORONARY ANGIOGRAPHY N/A 03/21/2019   Procedure: LEFT HEART CATH  AND CORONARY ANGIOGRAPHY;  Surgeon: [name], MD;  Location: [place] [facility];  Service: Cardiovascular;  Laterality: N/A;   REPAIR OF ESOPHAGUS  1998   perforation of the distal esophagus from bad heartburn   RIGHT/LEFT HEART CATH AND CORONARY ANGIOGRAPHY N/A 01/24/2020   Procedure: RIGHT/LEFT HEART CATH AND CORONARY ANGIOGRAPHY;  Surgeon: [name], MD;  Location: [place] [facility];  Service: Cardiovascular;  Laterality: N/A;   RIGHT/LEFT HEART CATH AND CORONARY ANGIOGRAPHY N/A 08/13/2021   Procedure: RIGHT/LEFT HEART CATH AND CORONARY ANGIOGRAPHY;  Surgeon: Nigel Mormon, MD;  Location: [place] [facility];  Service: Cardiovascular;  Laterality: N/A;   TEE WITHOUT CARDIOVERSION N/A 11/13/2016   Procedure: TRANSESOPHAGEAL ECHOCARDIOGRAM (TEE);  Surgeon: Thayer Headings, MD;  Location: [facility] ENDOSCOPY;  Service: Cardiovascular;  Laterality: N/A;    Current Medications: Current Meds  Medication Sig   acetaminophen (TYLENOL) 500 MG tablet Take 500 mg by mouth 2 (two) times daily as needed for moderate pain.   Alirocumab (PRALUENT) 75 MG/ML SOAJ Inject 75 mg into the skin every 14 (fourteen) days.   amLODipine (NORVASC) 10 MG tablet Take 1 tablet (10 mg total)  by mouth daily.   apixaban (ELIQUIS) 5 MG TABS tablet Take 1 tablet (5 mg total) by mouth 2 (two) times daily.   bisacodyl (DULCOLAX) 5 MG EC tablet Take 5 mg by mouth daily as needed.   clopidogrel (PLAVIX) 75 MG tablet Take 1 tablet (75 mg total) by mouth daily.   docusate sodium (COLACE) 100 MG capsule Take 100 mg by mouth 2 (two) times daily as needed.   ezetimibe (ZETIA) 10 MG tablet Take 1 tablet (10 mg total) by mouth daily.   fexofenadine (ALLEGRA) 180 MG tablet Take 180 mg by mouth daily as needed (seasonal allergies).   hydrALAZINE (APRESOLINE) 50 MG tablet With additional dose as needed for BP >150/90 mmHg.   hydrochlorothiazide (MICROZIDE) 12.5 MG capsule Take 1 capsule (12.5 mg total) by mouth daily.   metoprolol  tartrate (LOPRESSOR) 100 MG tablet Take 1 tablet (100 mg total) by mouth 2 (two) times daily.   nicotine (NICODERM CQ) 14 mg/24hr patch Place 1 patch (14 mg total) onto the skin daily.   nicotine (NICODERM CQ) 7 mg/24hr patch Place 1 patch (7 mg total) onto the skin daily. Please start after you have used up 40 mg dosage   nitroGLYCERIN (NITROSTAT) 0.4 MG SL tablet Place 1 tablet (0.4 mg total) under the tongue every 5 (five) minutes as needed for chest pain.   pantoprazole (PROTONIX) 20 MG tablet Take 1 tablet (20 mg total) by mouth 2 (two) times daily.   sacubitril-valsartan (ENTRESTO) 97-103 MG Take 1 tablet by mouth 2 (two) times daily.     Allergies:   Codeine, Crestor [rosuvastatin], Albuterol, Dilaudid [hydromorphone], Isosorbide, Lexapro [escitalopram], Lipitor [atorvastatin calcium], Erythromycin, and Oxycodone   Social History   Socioeconomic History   Marital status: Married    Spouse name: Not on file   Number of children: 2   Years of education: Not on file   Highest education level: Not on file  Occupational History   Not on file  Tobacco Use   Smoking status: Every Day    Packs/day: 0.25    Years: 25.00    Pack years: 6.25    Types: Cigarettes    Start date: 1989   Smokeless tobacco: Never  Vaping Use   Vaping Use: Never used  Substance and Sexual Activity   Alcohol use: No   Drug use: No   Sexual activity: Yes  Other Topics Concern   Not on file  Social History Narrative   Pt lives in [place] with spouse and 56 year old son.   disabled   Social Determinants of Radio broadcast assistant Strain: Not on file  Food Insecurity: Not on file  Transportation Needs: Not on file  Physical Activity: Not on file  Stress: Not on file  Social Connections: Not on file     Family History: The patient's family history includes Breast cancer in his mother; Congestive Heart Failure in his father; Diabetes in his father and mother; Heart attack (age of onset: 61) in  his father; Heart disease (age of onset: 14) in his mother; Lung cancer in his father and paternal uncle; Tongue cancer in his mother. There is no history of Colon cancer, Esophageal cancer, Inflammatory bowel disease, Liver disease, Pancreatic cancer, Rectal cancer, or Stomach cancer.  ROS:   Please see the history of present illness.    Somewhat anxious about today's visit.  Measures his blood pressure at home frequently.  It is labile.  For the most part is above  140/90.  Not on a diuretic.  All other systems reviewed and are negative.  EKGs/Labs/Other Studies Reviewed:    The following studies were reviewed today:  CARDIAC CATH 08/13/2021: Diagnostic Dominance: Left Intervention   CARDIAC CATH / PCI11/2022: Diagnostic Dominance: Left Intervention  After postdilatation residual stenosis was noted  EKG:  EKG form 10/08/2021, demonstrates normal sinus rhythm with normal overall appearance.  Recent Labs: 12/10/2020: NT-Pro BNP 74 01/09/2021: TSH 2.22 10/03/2021: ALT 21; BUN 16; Creatinine, Ser 0.79; Hemoglobin 16.1; Platelets 312; Potassium 3.4; Sodium 137  Recent Lipid Panel    Component Value Date/Time   CHOL 281 (H) 09/10/2021 1456   TRIG 141 09/10/2021 1456   HDL 53 09/10/2021 1456   CHOLHDL 5.0 11/27/2017 0634   VLDL 16 11/27/2017 0634   LDLCALC 202 (H) 09/10/2021 1456    Physical Exam:    VS:  BP 122/88    Pulse 67    Ht 5\' 9"  (1.753 m)    Wt 243 lb 6.4 oz (110.4 kg)    SpO2 99%    BMI 35.94 kg/m     Wt Readings from Last 3 Encounters:  11/12/21 243 lb 6.4 oz (110.4 kg)  09/17/21 241 lb (109.3 kg)  08/30/21 245 lb (111.1 kg)     GEN: Overweight. No acute distress HEENT: Normal NECK: No JVD. LYMPHATICS: No lymphadenopathy CARDIAC: No murmur. RRR no gallop, or edema. VASCULAR:  Normal Pulses. No bruits. RESPIRATORY:  Clear to auscultation without rales, wheezing or rhonchi  ABDOMEN: Soft, non-tender, non-distended, No pulsatile mass, MUSCULOSKELETAL: No  deformity  SKIN: Warm and dry NEUROLOGIC:  Alert and oriented x 3 PSYCHIATRIC:  Normal affect   ASSESSMENT:    1. Coronary artery disease of native artery of native heart with stable angina pectoris (HCC)   2. Paroxysmal atrial fibrillation ([place])   3. Tobacco abuse counseling   4. Chronic combined systolic and diastolic heart failure ([place])   5. Primary hypertension   6. S/P ablation of atrial fibrillation   7. Hypercholesteremia   8. Excessive daytime sleepiness    PLAN:    In order of problems listed above:  Secondary prevention discussed. No recent episodes of atrial fibrillation. Stop smoking. He is on a good heart failure regimen as outlined including metoprolol (but tartrate is being used) a Apresoline, Entresto, and has improved LV function. Blood pressure is not well controlled and could be labile due to poor sleep management.  Continue Lopressor a Apresoline amlodipine and Entresto.  Add low-dose HCTZ 12.5 mg/day. Did not discuss his ablation. Continue Praluent and determine if it could be causing fatigue and difficulty with sleep. Needs to be reevaluated for sleep apnea.  Also recommended that he discuss his issues with fatigue and poor sleep with the primary care physician.   Medication Adjustments/Labs and Tests Ordered: Current medicines are reviewed at length with the patient today.  Concerns regarding medicines are outlined above.  Orders Placed This Encounter  Procedures   Basic metabolic panel   Meds ordered this encounter  Medications   hydrochlorothiazide (MICROZIDE) 12.5 MG capsule    Sig: Take 1 capsule (12.5 mg total) by mouth daily.    Dispense:  90 capsule    Refill:  3    Patient Instructions  Medication Instructions:  START hydrochlorothiazide 12.5 mg by mouth daily. *If you need a refill on your cardiac medications before your next appointment, please call your pharmacy*   Lab Work: BMET in one month. If you have labs (blood work)  drawn  today and your tests are completely normal, you will receive your results only by: MyChart Message (if you have MyChart) OR A paper copy in the mail If you have any lab test that is abnormal or we need to change your treatment, we will call you to review the results.   Testing/Procedures: None today    Follow-Up: At [facility], you and your health needs are our priority.  As part of our continuing mission to provide you with exceptional heart care, we have created designated Provider Care Teams.  These Care Teams include your primary Cardiologist (physician) and Advanced Practice Providers (APPs -  Physician Assistants and Nurse Practitioners) who all work together to provide you with the care you need, when you need it.  We recommend signing up for the patient portal called "MyChart".  Sign up information is provided on this After Visit Summary.  MyChart is used to connect with patients for Virtual Visits (Telemedicine).  Patients are able to view lab/test results, encounter notes, upcoming appointments, etc.  Non-urgent messages can be sent to your provider as well.   To learn more about what you can do with MyChart, go to [email or web address].    Your next appointment:   4-6 month(s)  The format for your next appointment:   In Person  Provider:   Aurora Mask III   Please follow up with primary care provider for your trouble sleeping.     Signed, Sinclair Grooms, MD  11/12/2021 3:41 PM    [facility]

## 2021-11-12 ENCOUNTER — Encounter: Payer: Self-pay | Admitting: Interventional Cardiology

## 2021-11-12 ENCOUNTER — Ambulatory Visit (INDEPENDENT_AMBULATORY_CARE_PROVIDER_SITE_OTHER): Payer: Medicare Other | Admitting: Interventional Cardiology

## 2021-11-12 ENCOUNTER — Other Ambulatory Visit: Payer: Self-pay

## 2021-11-12 VITALS — BP 122/88 | HR 67 | Ht 69.0 in | Wt 243.4 lb

## 2021-11-12 DIAGNOSIS — E7800 Pure hypercholesterolemia, unspecified: Secondary | ICD-10-CM

## 2021-11-12 DIAGNOSIS — I5042 Chronic combined systolic (congestive) and diastolic (congestive) heart failure: Secondary | ICD-10-CM

## 2021-11-12 DIAGNOSIS — I25118 Atherosclerotic heart disease of native coronary artery with other forms of angina pectoris: Secondary | ICD-10-CM

## 2021-11-12 DIAGNOSIS — Z716 Tobacco abuse counseling: Secondary | ICD-10-CM

## 2021-11-12 DIAGNOSIS — I480 Paroxysmal atrial fibrillation: Secondary | ICD-10-CM

## 2021-11-12 DIAGNOSIS — G4719 Other hypersomnia: Secondary | ICD-10-CM

## 2021-11-12 DIAGNOSIS — Z8679 Personal history of other diseases of the circulatory system: Secondary | ICD-10-CM

## 2021-11-12 DIAGNOSIS — Z9889 Other specified postprocedural states: Secondary | ICD-10-CM | POA: Diagnosis not present

## 2021-11-12 DIAGNOSIS — I10 Essential (primary) hypertension: Secondary | ICD-10-CM

## 2021-11-12 MED ORDER — HYDROCHLOROTHIAZIDE 12.5 MG PO CAPS: 12.5000 mg | ORAL_CAPSULE | Freq: Every day | ORAL | 3 refills | Status: AC

## 2021-11-12 NOTE — Patient Instructions (Signed)
Medication Instructions:  START hydrochlorothiazide 12.5 mg by mouth daily. *If you need a refill on your cardiac medications before your next appointment, please call your pharmacy*   Lab Work: BMET in one month. If you have labs (blood work) drawn today and your tests are completely normal, you will receive your results only by: [place] (if you have MyChart) OR A paper copy in the mail If you have any lab test that is abnormal or we need to change your treatment, we will call you to review the results.   Testing/Procedures: None today    Follow-Up: At [facility], you and your health needs are our priority.  As part of our continuing mission to provide you with exceptional heart care, we have created designated Provider Care Teams.  These Care Teams include your primary Cardiologist (physician) and Advanced Practice Providers (APPs -  Physician Assistants and Nurse Practitioners) who all work together to provide you with the care you need, when you need it.  We recommend signing up for the patient portal called "MyChart".  Sign up information is provided on this After Visit Summary.  MyChart is used to connect with patients for Virtual Visits (Telemedicine).  Patients are able to view lab/test results, encounter notes, upcoming appointments, etc.  Non-urgent messages can be sent to your provider as well.   To learn more about what you can do with MyChart, go to [email or web address].    Your next appointment:   4-6 month(s)  The format for your next appointment:   In Person  Provider:   Aurora Mask III   Please follow up with primary care provider for your trouble sleeping.

## 2021-11-17 DIAGNOSIS — Z20822 Contact with and (suspected) exposure to covid-19: Secondary | ICD-10-CM | POA: Diagnosis not present

## 2021-11-18 DIAGNOSIS — Z20822 Contact with and (suspected) exposure to covid-19: Secondary | ICD-10-CM | POA: Diagnosis not present

## 2021-11-19 DIAGNOSIS — Z20822 Contact with and (suspected) exposure to covid-19: Secondary | ICD-10-CM | POA: Diagnosis not present

## 2021-12-12 ENCOUNTER — Other Ambulatory Visit: Payer: Medicare Other

## 2021-12-19 ENCOUNTER — Encounter: Payer: Self-pay | Admitting: *Deleted

## 2021-12-23 ENCOUNTER — Encounter: Payer: Self-pay | Admitting: Diagnostic Neuroimaging

## 2021-12-23 ENCOUNTER — Ambulatory Visit (INDEPENDENT_AMBULATORY_CARE_PROVIDER_SITE_OTHER): Payer: Medicare Other | Admitting: Diagnostic Neuroimaging

## 2021-12-23 VITALS — BP 133/85 | HR 65 | Ht 70.0 in | Wt 243.6 lb

## 2021-12-23 DIAGNOSIS — H539 Unspecified visual disturbance: Secondary | ICD-10-CM | POA: Diagnosis not present

## 2021-12-23 DIAGNOSIS — M542 Cervicalgia: Secondary | ICD-10-CM

## 2021-12-23 NOTE — Patient Instructions (Addendum)
°  TRANSIENT HEADACHE / NECK PAIN / VISION CHANGE (stroke vs migraine variant) - continue medical mgmt (eliquis, clopidogrel, praluent, BP control, tobacco cessation) - daily physical activity / exercise (at least 15-30 minutes) - eat more plants / vegetables - increase social activities, brain stimulation, games, puzzles, hobbies, crafts, arts, music - aim for at least 7-8 hours sleep per night (or more) - avoid smoking and alcohol

## 2021-12-23 NOTE — Progress Notes (Signed)
[facility]  [name] DOB: 1966-02-09  REFERRING CLINICIAN: Angelina Sheriff, MD HISTORY FROM: patient and son REASON FOR VISIT: new consult   HISTORICAL  CHIEF COMPLAINT:  Chief Complaint  Patient presents with   History of stroke, pain, dizziness    Rm 7 New Pt son- Valarie Merino    HISTORY OF PRESENT ILLNESS:   56 year old male here for evaluation of pain and vision loss.  10/03/2021 patient had new onset of right occipital/cervical sharp shooting pain.  This is associated with left-sided vision loss.  Patient went to the emergency room for evaluation.  CTA of the head and neck were unremarkable.  Patient was recommended to have MRI of the brain but he decided to go home because of the long wait in the ER.  Since that time no recurrent symptoms.  Patient does have remote history of migraine headaches with sensitivity to light and sound, dizziness and nausea.  No current ongoing headache issues.   REVIEW OF SYSTEMS: Full 14 system review of systems performed and negative with exception of: as per HPI.  ALLERGIES: Allergies  Allergen Reactions   Codeine Anaphylaxis   Crestor [Rosuvastatin] Other (See Comments)    Severe leg cramps   Albuterol Swelling   Dilaudid [Hydromorphone] Itching and Swelling   Isosorbide     Muscle pain and cramps   Lexapro [Escitalopram]     Have bad thoughts made anxiety worse    Lipitor [Atorvastatin Calcium] Other (See Comments)    myalgias   Erythromycin Other (See Comments)    Hallucinations    Oxycodone Itching and Rash    Swelling to face, hands, mouth Completley intolerant to any meds with oxycodone in it     HOME MEDICATIONS: Outpatient Medications Prior to Visit  Medication Sig Dispense Refill   acetaminophen (TYLENOL) 500 MG tablet Take 500 mg by mouth 2 (two) times daily as needed for moderate pain.     Alirocumab (PRALUENT) 75 MG/ML SOAJ Inject 75 mg into the skin every 14 (fourteen) days.  2 mL 3   amLODipine (NORVASC) 10 MG tablet Take 1 tablet (10 mg total) by mouth daily. 90 tablet 0   apixaban (ELIQUIS) 5 MG TABS tablet Take 1 tablet (5 mg total) by mouth 2 (two) times daily. 180 tablet 0   bisacodyl (DULCOLAX) 5 MG EC tablet Take 5 mg by mouth daily as needed.     clopidogrel (PLAVIX) 75 MG tablet Take 1 tablet (75 mg total) by mouth daily. 90 tablet 1   docusate sodium (COLACE) 100 MG capsule Take 100 mg by mouth 2 (two) times daily as needed.     fexofenadine (ALLEGRA) 180 MG tablet Take 180 mg by mouth daily as needed (seasonal allergies).     hydrALAZINE (APRESOLINE) 50 MG tablet With additional dose as needed for BP >150/90 mmHg. 90 tablet 1   hydrochlorothiazide (MICROZIDE) 12.5 MG capsule Take 1 capsule (12.5 mg total) by mouth daily. 90 capsule 3   metoprolol tartrate (LOPRESSOR) 100 MG tablet Take 1 tablet (100 mg total) by mouth 2 (two) times daily. 180 tablet 1   nicotine (NICODERM CQ) 14 mg/24hr patch Place 1 patch (14 mg total) onto the skin daily. 28 patch 0   nicotine (NICODERM CQ) 7 mg/24hr patch Place 1 patch (7 mg total) onto the skin daily. Please start after you have used up 40 mg dosage 28 patch 1   nitroGLYCERIN (NITROSTAT) 0.4 MG SL tablet Place 1 tablet (0.4  mg total) under the tongue every 5 (five) minutes as needed for chest pain. 25 tablet 2   pantoprazole (PROTONIX) 20 MG tablet Take 1 tablet (20 mg total) by mouth 2 (two) times daily. 180 tablet 2   sacubitril-valsartan (ENTRESTO) 97-103 MG Take 1 tablet by mouth 2 (two) times daily. 60 tablet 3   ezetimibe (ZETIA) 10 MG tablet Take 1 tablet (10 mg total) by mouth daily. (Patient not taking: Reported on 12/23/2021) 90 tablet 2   No facility-administered medications prior to visit.    PAST MEDICAL HISTORY: Past Medical History:  Diagnosis Date   Arthritis    "a little in my right knee" (07/14/2016)   Atrial fibrillation (HCC)    CAD (coronary artery disease)    Chronic congestive heart failure  with left ventricular diastolic dysfunction (HCC)    Complication of anesthesia    Pt reports "they have a hard time waking me up"   GERD (gastroesophageal reflux disease)    hx   Headache    "with every heart issue that I have" (07/14/2016)   High cholesterol    History of hiatal hernia 1990s   "fixed it w/scope down my throat"   Hypertension    Myocardial infarction ([place]) 05/2013   stents: left PDA, 1st OM at [facility]   Obesity    OSA (obstructive sleep apnea)    Persistent atrial fibrillation ([place])    Pneumonia ~ 1992   QT prolongation 07/15/2016   Seasonal allergies    "I take Allegra prn" (07/14/2016)   Stroke ([place])    Tobacco abuse 12/16/2016    PAST SURGICAL HISTORY: Past Surgical History:  Procedure Laterality Date   APPENDECTOMY  1984   ATRIAL FIBRILLATION ABLATION N/A 03/01/2020   Procedure: ATRIAL FIBRILLATION ABLATION;  Surgeon: [name], MD;  Location: [place] [facility];  Service: Cardiovascular;  Laterality: N/A;   CARDIOVERSION N/A 05/27/2016   Procedure: CARDIOVERSION;  Surgeon: [name], MD;  Location: [facility] ENDOSCOPY;  Service: Cardiovascular;  Laterality: N/A;   CARDIOVERSION N/A 06/11/2016   Procedure: CARDIOVERSION;  Surgeon: [name], MD;  Location: [place];  Service: Cardiovascular;  Laterality: N/A;   CARDIOVERSION N/A 03/21/2020   Procedure: CARDIOVERSION;  Surgeon: Geralynn Rile, MD;  Location: [place];  Service: Cardiovascular;  Laterality: N/A;   CORONARY ANGIOPLASTY WITH STENT PLACEMENT  05/30/2013   "2 stents put in at [facility]" & /stent card   CORONARY STENT INTERVENTION N/A 11/27/2017   Procedure: CORONARY STENT INTERVENTION;  Surgeon: [name], MD;  Location: [place] [facility];  Service: Cardiovascular;  Laterality: N/A;   CORONARY STENT INTERVENTION N/A 03/21/2019   Procedure: CORONARY STENT INTERVENTION;  Surgeon: [name], MD;  Location: [place] [facility];  Service: Cardiovascular;  Laterality: N/A;    CORONARY STENT INTERVENTION N/A 08/13/2021   Procedure: CORONARY STENT INTERVENTION;  Surgeon: Nigel Mormon, MD;  Location: [place] [facility];  Service: Cardiovascular;  Laterality: N/A;   CORONARY STENT INTERVENTION N/A 09/17/2021   Procedure: CORONARY STENT INTERVENTION;  Surgeon: Nigel Mormon, MD;  Location: [place] [facility];  Service: Cardiovascular;  Laterality: N/A;   ELECTROPHYSIOLOGIC STUDY N/A 11/13/2016   Procedure: Atrial Fibrillation Ablation;  Surgeon: [name], MD;  Location: [facility];  Service: Cardiovascular;  Laterality: N/A;   ESOPHAGOGASTRODUODENOSCOPY (EGD) WITH ESOPHAGEAL DILATION  1990s X 2   INTRAVASCULAR IMAGING/OCT N/A 09/17/2021   Procedure: INTRAVASCULAR IMAGING/OCT;  Surgeon: Nigel Mormon, MD;  Location: [facility]  INVASIVE CV LAB;  Service: Cardiovascular;  Laterality: N/A;   INTRAVASCULAR PRESSURE WIRE/FFR STUDY N/A 09/21/2018   Procedure: INTRAVASCULAR PRESSURE WIRE/FFR STUDY;  Surgeon: [name], MD;  Location: [place] [facility];  Service: Cardiovascular;  Laterality: N/A;   INTRAVASCULAR PRESSURE WIRE/FFR STUDY N/A 03/21/2019   Procedure: INTRAVASCULAR PRESSURE WIRE/FFR STUDY;  Surgeon: [name], MD;  Location: [place] [facility];  Service: Cardiovascular;  Laterality: N/A;   INTRAVASCULAR PRESSURE WIRE/FFR STUDY N/A 01/24/2020   Procedure: INTRAVASCULAR PRESSURE WIRE/FFR STUDY;  Surgeon: [name], MD;  Location: [place] [facility];  Service: Cardiovascular;  Laterality: N/A;   INTRAVASCULAR ULTRASOUND/IVUS N/A 08/13/2021   Procedure: Intravascular Ultrasound/IVUS;  Surgeon: Nigel Mormon, MD;  Location: [place] [facility];  Service: Cardiovascular;  Laterality: N/A;   KNEE ARTHROSCOPY Right 1986; ~ 1995 X [place]  2015   LEFT HEART CATH AND CORONARY ANGIOGRAPHY N/A 11/27/2017   Procedure: LEFT HEART CATH AND CORONARY ANGIOGRAPHY;  Surgeon: [name], MD;  Location: [place] [facility];  Service:  Cardiovascular;  Laterality: N/A;   LEFT HEART CATH AND CORONARY ANGIOGRAPHY N/A 09/21/2018   Procedure: LEFT HEART CATH AND CORONARY ANGIOGRAPHY;  Surgeon: [name], MD;  Location: [place] [facility];  Service: Cardiovascular;  Laterality: N/A;   LEFT HEART CATH AND CORONARY ANGIOGRAPHY N/A 03/21/2019   Procedure: LEFT HEART CATH AND CORONARY ANGIOGRAPHY;  Surgeon: [name], MD;  Location: [place] [facility];  Service: Cardiovascular;  Laterality: N/A;   REPAIR OF ESOPHAGUS  1998   perforation of the distal esophagus from bad heartburn   RIGHT/LEFT HEART CATH AND CORONARY ANGIOGRAPHY N/A 01/24/2020   Procedure: RIGHT/LEFT HEART CATH AND CORONARY ANGIOGRAPHY;  Surgeon: [name], MD;  Location: [place] [facility];  Service: Cardiovascular;  Laterality: N/A;   RIGHT/LEFT HEART CATH AND CORONARY ANGIOGRAPHY N/A 08/13/2021   Procedure: RIGHT/LEFT HEART CATH AND CORONARY ANGIOGRAPHY;  Surgeon: Nigel Mormon, MD;  Location: [place] [facility];  Service: Cardiovascular;  Laterality: N/A;   TEE WITHOUT CARDIOVERSION N/A 11/13/2016   Procedure: TRANSESOPHAGEAL ECHOCARDIOGRAM (TEE);  Surgeon: Thayer Headings, MD;  Location: [place];  Service: Cardiovascular;  Laterality: N/A;    FAMILY HISTORY: Family History  Problem Relation Age of Onset   Hypertension Mother    Heart attack Mother    Heart disease Mother 8       CABG age 6   Breast cancer Mother    Tongue cancer Mother    Diabetes Mother    Hypertension Father    Cancer Father    Heart attack Father 97       Father had around 7 heart attacks per pt   Diabetes Father    Lung cancer Father    Congestive Heart Failure Father    Lung cancer Paternal Uncle    Colon cancer Neg Hx    Esophageal cancer Neg Hx    Inflammatory bowel disease Neg Hx    Liver disease Neg Hx    Pancreatic cancer Neg Hx    Rectal cancer Neg Hx    Stomach cancer Neg Hx     SOCIAL HISTORY: Social History   Socioeconomic History   Marital  status: Married    Spouse name: Not on file   Number of children: 2   Years of education: Not on file   Highest education level: Not on file  Occupational History   Not on file  Tobacco Use   Smoking  status: Every Day    Packs/day: 0.25    Years: 25.00    Pack years: 6.25    Types: Cigarettes    Start date: 1989   Smokeless tobacco: Never  Vaping Use   Vaping Use: Never used  Substance and Sexual Activity   Alcohol use: No   Drug use: No   Sexual activity: Yes  Other Topics Concern   Not on file  Social History Narrative   Pt lives in [place] with spouse and 93 year old son.   disabled   Social Determinants of Radio broadcast assistant Strain: Not on file  Food Insecurity: Not on file  Transportation Needs: Not on file  Physical Activity: Not on file  Stress: Not on file  Social Connections: Not on file  Intimate Partner Violence: Not on file     PHYSICAL EXAM  GENERAL EXAM/CONSTITUTIONAL: Vitals:  Vitals:   12/23/21 0935  BP: 133/85  Pulse: 65  Weight: 243 lb 9.6 oz (110.5 kg)  Height: 5\' 10"  (1.778 m)   Body mass index is 34.95 kg/m. Wt Readings from Last 3 Encounters:  12/23/21 243 lb 9.6 oz (110.5 kg)  11/12/21 243 lb 6.4 oz (110.4 kg)  09/17/21 241 lb (109.3 kg)   Patient is in no distress; well developed, nourished and groomed; neck is supple  CARDIOVASCULAR: Examination of carotid arteries is normal; no carotid bruits Regular rate and rhythm, no murmurs Examination of peripheral vascular system by observation and palpation is normal  EYES: Ophthalmoscopic exam of optic discs and posterior segments is normal; no papilledema or hemorrhages No results found.  MUSCULOSKELETAL: Gait, strength, tone, movements noted in Neurologic exam below  NEUROLOGIC: MENTAL STATUS:  No flowsheet data found. awake, alert, oriented to person, place and time recent and remote memory intact normal attention and concentration language fluent,  comprehension intact, naming intact fund of knowledge appropriate  CRANIAL NERVE:  2nd - no papilledema on fundoscopic exam 2nd, 3rd, 4th, 6th - pupils equal and reactive to light, visual fields full to confrontation, extraocular muscles intact, no nystagmus 5th - facial sensation symmetric 7th - facial strength symmetric 8th - hearing intact 9th - palate elevates symmetrically, uvula midline 11th - shoulder shrug symmetric 12th - tongue protrusion midline  MOTOR:  normal bulk and tone, full strength in the BUE, BLE  SENSORY:  normal and symmetric to light touch, temperature, vibration  COORDINATION:  finger-nose-finger, fine finger movements normal  REFLEXES:  deep tendon reflexes present and symmetric  GAIT/STATION:  narrow based gait     DIAGNOSTIC DATA (LABS, IMAGING, TESTING) - I reviewed patient records, labs, notes, testing and imaging myself where available.  Lab Results  Component Value Date   WBC 11.0 (H) 10/03/2021   HGB 16.1 10/03/2021   HCT 46.9 10/03/2021   MCV 84.7 10/03/2021   PLT 312 10/03/2021      Component Value Date/Time   NA 137 10/03/2021 2125   NA 139 09/10/2021 1456   K 3.4 (L) 10/03/2021 2125   CL 104 10/03/2021 2125   CO2 25 10/03/2021 2125   GLUCOSE 123 (H) 10/03/2021 2125   BUN 16 10/03/2021 2125   BUN 18 09/10/2021 1456   CREATININE 0.79 10/03/2021 2125   CREATININE 0.79 02/01/2020 1301   CREATININE 0.79 10/29/2016 1108   CALCIUM 9.5 10/03/2021 2125   PROT 6.8 10/03/2021 2125   PROT 6.4 09/10/2021 1456   ALBUMIN 3.1 (L) 10/03/2021 2125   ALBUMIN 3.7 (L) 09/10/2021 1456  AST 19 10/03/2021 2125   AST 18 02/01/2020 1301   ALT 21 10/03/2021 2125   ALT 27 02/01/2020 1301   ALKPHOS 106 10/03/2021 2125   BILITOT 0.2 (L) 10/03/2021 2125   BILITOT 0.3 09/10/2021 1456   BILITOT 0.4 02/01/2020 1301   GFRNONAA >60 10/03/2021 2125   GFRNONAA >60 02/01/2020 1301   GFRAA 112 12/10/2020 1323   GFRAA >60 02/01/2020 1301   Lab  Results  Component Value Date   CHOL 281 (H) 09/10/2021   HDL 53 09/10/2021   LDLCALC 202 (H) 09/10/2021   TRIG 141 09/10/2021   CHOLHDL 5.0 11/27/2017   Lab Results  Component Value Date   HGBA1C 6.0 (H) 11/27/2017   No results found for: [id number] Lab Results  Component Value Date   TSH 2.22 01/09/2021    10/03/21 CTA head / neck [I reviewed images myself and agree with interpretation. -VRP]  - Normal CTA of the head and neck.    ASSESSMENT AND PLAN  56 y.o. year old male here with:   Dx:  1. Neck pain on right side   2. Transient vision disturbance of both eyes       PLAN:  TRANSIENT HEADACHE / NECK PAIN / VISION CHANGE - ddx: stroke vs migraine variant - continue medical mgmt (eliquis, clopidogrel, praluent, BP control, tobacco cessation) - daily physical activity / exercise (at least 15-30 minutes) - eat more plants / vegetables - increase social activities, brain stimulation, games, puzzles, hobbies, crafts, arts, music - aim for at least 7-8 hours sleep per night (or more) - avoid smoking and alcohol  Return for pending if symptoms worsen or fail to improve, return to PCP.    Penni Bombard, MD 07/13/3111, 16:24 AM Certified in Neurology, Neurophysiology and Neuroimaging  [facility] [address], [place], [place] [address] [phone]

## 2022-01-20 ENCOUNTER — Encounter: Payer: Self-pay | Admitting: Cardiology

## 2022-01-20 ENCOUNTER — Other Ambulatory Visit: Payer: Self-pay | Admitting: Cardiology

## 2022-01-20 DIAGNOSIS — I5042 Chronic combined systolic (congestive) and diastolic (congestive) heart failure: Secondary | ICD-10-CM

## 2022-01-20 DIAGNOSIS — I10 Essential (primary) hypertension: Secondary | ICD-10-CM

## 2022-01-21 ENCOUNTER — Other Ambulatory Visit: Payer: Self-pay

## 2022-01-21 DIAGNOSIS — I5042 Chronic combined systolic (congestive) and diastolic (congestive) heart failure: Secondary | ICD-10-CM

## 2022-01-21 DIAGNOSIS — I10 Essential (primary) hypertension: Secondary | ICD-10-CM

## 2022-01-21 MED ORDER — APIXABAN 5 MG PO TABS: 5.0000 mg | ORAL_TABLET | Freq: Two times a day (BID) | ORAL | 0 refills | Status: DC

## 2022-01-21 MED ORDER — HYDRALAZINE HCL 50 MG PO TABS: ORAL_TABLET | ORAL | 0 refills | Status: DC

## 2022-03-01 ENCOUNTER — Encounter: Payer: Self-pay | Admitting: Interventional Cardiology

## 2022-03-03 MED ORDER — ENTRESTO 97-103 MG PO TABS: 1.0000 | ORAL_TABLET | Freq: Two times a day (BID) | ORAL | 8 refills | Status: DC

## 2022-03-17 ENCOUNTER — Encounter: Payer: Self-pay | Admitting: Interventional Cardiology

## 2022-03-17 DIAGNOSIS — Z79899 Other long term (current) drug therapy: Secondary | ICD-10-CM

## 2022-03-17 DIAGNOSIS — I5042 Chronic combined systolic (congestive) and diastolic (congestive) heart failure: Secondary | ICD-10-CM

## 2022-03-17 DIAGNOSIS — I10 Essential (primary) hypertension: Secondary | ICD-10-CM

## 2022-03-18 MED ORDER — SPIRONOLACTONE 25 MG PO TABS: 12.5000 mg | ORAL_TABLET | Freq: Every day | ORAL | 3 refills | Status: DC

## 2022-03-24 NOTE — H&P (View-Only) (Signed)
Cardiology Office Note:    Date:  03/25/2022   ID:  Danny Kelley, DOB 01-Apr-1966, MRN [id number]  PCP:  Angelina Sheriff, MD  Cardiologist:  Sinclair Grooms, MD   Referring MD: Angelina Sheriff, MD   Chief Complaint  Patient presents with   Coronary Artery Disease   Hyperlipidemia   Hypertension    History of Present Illness:    Danny Kelley is a 56 y.o. male with a hx of chronic systolic heart failure, hypertension, hyperlipidemia (statin intolerant), obstructive sleep apnea compliant with CPAP, tobacco use.  Patient has history of TIA in Feb 2018 while on anticoagulants, paroxysmal atrial fibrillation status post ablation 11/13/2016 and 03/01/2020 (Multaq and sotalol ineffective). Circumflex stents 2014 and 2019. Last cath 09/2021 where the LAD and right coronary was stented and the diagonal was angioplastied.  He reminds me that I take care of both his father and mother.   He is concerned about his blood pressure.  On this past Sunday he had a 30 to 40-minute episode of chest tightness that was eventually relieved after 2 sublingual nitroglycerin.'s cath in November demonstrated residual distal RCA disease that was not treated.  He has no exertional related chest discomfort.  There have been no recurrent episodes since Sunday.  He is accompanied today by his son.  At home blood pressures have been elevated with diastolics above 90 millimeter range.  Past Medical History:  Diagnosis Date   Arthritis    "a little in my right knee" (07/14/2016)   Atrial fibrillation (HCC)    CAD (coronary artery disease)    Chronic congestive heart failure with left ventricular diastolic dysfunction (HCC)    Complication of anesthesia    Pt reports "they have a hard time waking me up"   GERD (gastroesophageal reflux disease)    hx   Headache    "with every heart issue that I have" (07/14/2016)   High cholesterol    History of hiatal hernia 1990s   "fixed it w/scope down my throat"    Hypertension    Myocardial infarction ([place]) 05/2013   stents: left PDA, 1st OM at [facility]   Obesity    OSA (obstructive sleep apnea)    Persistent atrial fibrillation ([place])    Pneumonia ~ 1992   QT prolongation 07/15/2016   Seasonal allergies    "I take Allegra prn" (07/14/2016)   Stroke ([place])    Tobacco abuse 12/16/2016    Past Surgical History:  Procedure Laterality Date   APPENDECTOMY  1984   ATRIAL FIBRILLATION ABLATION N/A 03/01/2020   Procedure: ATRIAL FIBRILLATION ABLATION;  Surgeon: [name], MD;  Location: [place] [facility];  Service: Cardiovascular;  Laterality: N/A;   CARDIOVERSION N/A 05/27/2016   Procedure: CARDIOVERSION;  Surgeon: [name], MD;  Location: [facility] ENDOSCOPY;  Service: Cardiovascular;  Laterality: N/A;   CARDIOVERSION N/A 06/11/2016   Procedure: CARDIOVERSION;  Surgeon: [name], MD;  Location: [place];  Service: Cardiovascular;  Laterality: N/A;   CARDIOVERSION N/A 03/21/2020   Procedure: CARDIOVERSION;  Surgeon: Geralynn Rile, MD;  Location: [place];  Service: Cardiovascular;  Laterality: N/A;   CORONARY ANGIOPLASTY WITH STENT PLACEMENT  05/30/2013   "2 stents put in at [facility]" & /stent card   CORONARY STENT INTERVENTION N/A 11/27/2017   Procedure: CORONARY STENT INTERVENTION;  Surgeon: [name], MD;  Location: [place] [facility];  Service: Cardiovascular;  Laterality: N/A;   CORONARY STENT INTERVENTION N/A  03/21/2019   Procedure: CORONARY STENT INTERVENTION;  Surgeon: [name], MD;  Location: [place] [facility];  Service: Cardiovascular;  Laterality: N/A;   CORONARY STENT INTERVENTION N/A 08/13/2021   Procedure: CORONARY STENT INTERVENTION;  Surgeon: Nigel Mormon, MD;  Location: [place] [facility];  Service: Cardiovascular;  Laterality: N/A;   CORONARY STENT INTERVENTION N/A 09/17/2021   Procedure: CORONARY STENT INTERVENTION;  Surgeon: Nigel Mormon, MD;  Location: [place] [facility];  Service:  Cardiovascular;  Laterality: N/A;   ELECTROPHYSIOLOGIC STUDY N/A 11/13/2016   Procedure: Atrial Fibrillation Ablation;  Surgeon: [name], MD;  Location: [place] [facility];  Service: Cardiovascular;  Laterality: N/A;   ESOPHAGOGASTRODUODENOSCOPY (EGD) WITH ESOPHAGEAL DILATION  1990s X 2   INTRAVASCULAR IMAGING/OCT N/A 09/17/2021   Procedure: INTRAVASCULAR IMAGING/OCT;  Surgeon: Nigel Mormon, MD;  Location: [place] [facility];  Service: Cardiovascular;  Laterality: N/A;   INTRAVASCULAR PRESSURE WIRE/FFR STUDY N/A 09/21/2018   Procedure: INTRAVASCULAR PRESSURE WIRE/FFR STUDY;  Surgeon: [name], MD;  Location: [place] [facility];  Service: Cardiovascular;  Laterality: N/A;   INTRAVASCULAR PRESSURE WIRE/FFR STUDY N/A 03/21/2019   Procedure: INTRAVASCULAR PRESSURE WIRE/FFR STUDY;  Surgeon: [name], MD;  Location: [place] [facility];  Service: Cardiovascular;  Laterality: N/A;   INTRAVASCULAR PRESSURE WIRE/FFR STUDY N/A 01/24/2020   Procedure: INTRAVASCULAR PRESSURE WIRE/FFR STUDY;  Surgeon: [name], MD;  Location: [place] [facility];  Service: Cardiovascular;  Laterality: N/A;   INTRAVASCULAR ULTRASOUND/IVUS N/A 08/13/2021   Procedure: Intravascular Ultrasound/IVUS;  Surgeon: Nigel Mormon, MD;  Location: [place] [facility];  Service: Cardiovascular;  Laterality: N/A;   KNEE ARTHROSCOPY Right 1986; ~ 1995 X [place]  2015   LEFT HEART CATH AND CORONARY ANGIOGRAPHY N/A 11/27/2017   Procedure: LEFT HEART CATH AND CORONARY ANGIOGRAPHY;  Surgeon: [name], MD;  Location: [place] [facility];  Service: Cardiovascular;  Laterality: N/A;   LEFT HEART CATH AND CORONARY ANGIOGRAPHY N/A 09/21/2018   Procedure: LEFT HEART CATH AND CORONARY ANGIOGRAPHY;  Surgeon: [name], MD;  Location: [place] [facility];  Service: Cardiovascular;  Laterality: N/A;   LEFT HEART CATH AND CORONARY ANGIOGRAPHY N/A 03/21/2019   Procedure: LEFT HEART CATH AND CORONARY ANGIOGRAPHY;   Surgeon: [name], MD;  Location: [place] [facility];  Service: Cardiovascular;  Laterality: N/A;   REPAIR OF ESOPHAGUS  1998   perforation of the distal esophagus from bad heartburn   RIGHT/LEFT HEART CATH AND CORONARY ANGIOGRAPHY N/A 01/24/2020   Procedure: RIGHT/LEFT HEART CATH AND CORONARY ANGIOGRAPHY;  Surgeon: [name], MD;  Location: [place] [facility];  Service: Cardiovascular;  Laterality: N/A;   RIGHT/LEFT HEART CATH AND CORONARY ANGIOGRAPHY N/A 08/13/2021   Procedure: RIGHT/LEFT HEART CATH AND CORONARY ANGIOGRAPHY;  Surgeon: Nigel Mormon, MD;  Location: [place] [facility];  Service: Cardiovascular;  Laterality: N/A;   TEE WITHOUT CARDIOVERSION N/A 11/13/2016   Procedure: TRANSESOPHAGEAL ECHOCARDIOGRAM (TEE);  Surgeon: Thayer Headings, MD;  Location: [facility] ENDOSCOPY;  Service: Cardiovascular;  Laterality: N/A;    Current Medications: Current Meds  Medication Sig   acetaminophen (TYLENOL) 500 MG tablet Take 500 mg by mouth 2 (two) times daily as needed for moderate pain.   Alirocumab (PRALUENT) 75 MG/ML SOAJ Inject 75 mg into the skin every 14 (fourteen) days.   amLODipine (NORVASC) 10 MG tablet Take 1 tablet (10 mg total) by mouth daily.   apixaban (ELIQUIS) 5 MG TABS tablet Take 1 tablet (  5 mg total) by mouth 2 (two) times daily.   bisacodyl (DULCOLAX) 5 MG EC tablet Take 5 mg by mouth daily as needed.   clopidogrel (PLAVIX) 75 MG tablet Take 1 tablet (75 mg total) by mouth daily.   docusate sodium (COLACE) 100 MG capsule Take 100 mg by mouth 2 (two) times daily as needed.   fexofenadine (ALLEGRA) 180 MG tablet Take 180 mg by mouth daily as needed (seasonal allergies).   hydrALAZINE (APRESOLINE) 50 MG tablet TAKE ADDITIONAL DOSE AS NEEDED FOR BP GREATER THAN 150/90 MMHG   hydrochlorothiazide (MICROZIDE) 12.5 MG capsule Take 1 capsule (12.5 mg total) by mouth daily.   metoprolol tartrate (LOPRESSOR) 100 MG tablet Take 1 tablet (100 mg total) by mouth 2 (two) times daily.    nicotine (NICODERM CQ) 14 mg/24hr patch Place 1 patch (14 mg total) onto the skin daily.   nicotine (NICODERM CQ) 7 mg/24hr patch Place 1 patch (7 mg total) onto the skin daily. Please start after you have used up 40 mg dosage   nitroGLYCERIN (NITROSTAT) 0.4 MG SL tablet Place 1 tablet (0.4 mg total) under the tongue every 5 (five) minutes as needed for chest pain.   pantoprazole (PROTONIX) 20 MG tablet Take 1 tablet (20 mg total) by mouth 2 (two) times daily.   sacubitril-valsartan (ENTRESTO) 97-103 MG Take 1 tablet by mouth 2 (two) times daily.   spironolactone (ALDACTONE) 25 MG tablet Take 1 tablet (25 mg total) by mouth daily.   [DISCONTINUED] spironolactone (ALDACTONE) 25 MG tablet Take 0.5 tablets (12.5 mg total) by mouth daily.     Allergies:   Codeine, Crestor [rosuvastatin], Albuterol, Dilaudid [hydromorphone], Isosorbide, Lexapro [escitalopram], Lipitor [atorvastatin calcium], Erythromycin, and Oxycodone   Social History   Socioeconomic History   Marital status: Married    Spouse name: Not on file   Number of children: 2   Years of education: Not on file   Highest education level: Not on file  Occupational History   Not on file  Tobacco Use   Smoking status: Every Day    Packs/day: 0.25    Years: 25.00    Pack years: 6.25    Types: Cigarettes    Start date: 1989   Smokeless tobacco: Never  Vaping Use   Vaping Use: Never used  Substance and Sexual Activity   Alcohol use: No   Drug use: No   Sexual activity: Yes  Other Topics Concern   Not on file  Social History Narrative   Pt lives in [place] with spouse and 36 year old son.   disabled   Social Determinants of Radio broadcast assistant Strain: Not on file  Food Insecurity: Not on file  Transportation Needs: Not on file  Physical Activity: Not on file  Stress: Not on file  Social Connections: Not on file     Family History: The patient's family history includes Breast cancer in his mother; Cancer in  his father; Congestive Heart Failure in his father; Diabetes in his father and mother; Heart attack in his mother; Heart attack (age of onset: 51) in his father; Heart disease (age of onset: 24) in his mother; Hypertension in his father and mother; Lung cancer in his father and paternal uncle; Tongue cancer in his mother. There is no history of Colon cancer, Esophageal cancer, Inflammatory bowel disease, Liver disease, Pancreatic cancer, Rectal cancer, or Stomach cancer.  ROS:   Please see the history of present illness.    Had blood work drawn  today.  He has had spikes in blood pressure at home.  All other systems reviewed and are negative.  EKGs/Labs/Other Studies Reviewed:    The following studies were reviewed today:  CARDIAC CATH /PCI 09/2021: Diagnostic Dominance: Left Intervention   He was left with 60% stenosis proximal to the proximal LAD stent.  Distal territory is small. EKG:  EKG not performed  Recent Labs: 10/03/2021: ALT 21; BUN 16; Creatinine, Ser 0.79; Hemoglobin 16.1; Platelets 312; Potassium 3.4; Sodium 137  Recent Lipid Panel    Component Value Date/Time   CHOL 281 (H) 09/10/2021 1456   TRIG 141 09/10/2021 1456   HDL 53 09/10/2021 1456   CHOLHDL 5.0 11/27/2017 0634   VLDL 16 11/27/2017 0634   LDLCALC 202 (H) 09/10/2021 1456    Physical Exam:    VS:  BP (!) 132/100   Pulse 62   Ht '5\' 10"'$  (1.778 m)   Wt 239 lb 9.6 oz (108.7 kg)   SpO2 97%   BMI 34.38 kg/m     Wt Readings from Last 3 Encounters:  03/25/22 239 lb 9.6 oz (108.7 kg)  12/23/21 243 lb 9.6 oz (110.5 kg)  11/12/21 243 lb 6.4 oz (110.4 kg)     GEN: Obese. No acute distress HEENT: Normal NECK: No JVD. LYMPHATICS: No lymphadenopathy CARDIAC: No murmur. RRR S4 gallop, or edema. VASCULAR:  Normal Pulses. No bruits. RESPIRATORY:  Clear to auscultation without rales, wheezing or rhonchi  ABDOMEN: Soft, non-tender, non-distended, No pulsatile mass, MUSCULOSKELETAL: No deformity  SKIN: Warm and  dry NEUROLOGIC:  Alert and oriented x 3 PSYCHIATRIC:  Normal affect   ASSESSMENT:    1. Coronary artery disease of native artery of native heart with stable angina pectoris (HCC)   2. Persistent atrial fibrillation ([place])   3. Chronic combined systolic and diastolic heart failure ([place])   4. Essential hypertension   5. Tobacco abuse counseling   6. Nonischemic cardiomyopathy ([place])    PLAN:    In order of problems listed above:  I encouraged use of nitroglycerin.  Suspect that episode of discomfort was related to the distal RCA which was not treated at the time of the last cath because.  Could have restenosis proximal to the RCA stent which had residual disease after stent deployment.  Could also be having angina related to disease in the diagonal.  Plan use sublingual nitroglycerin.  If anginal complaints increase, may need to have repeat cath. No recurrent episodes of atrial fibrillation.  Had ablation with resolution.  Still taking Eliquis and Plavix.  Plavix is for the most recent stents that were deployed in November 2022.  The stents were LAD and RCA (orbital atherectomy followed by stent). Continue 4-prong therapy with metoprolol tartrate (probably needs to be converted to either carvedilol or metoprolol succinate), Entresto 97/103 mg tablets twice daily, Aldactone 12.5 mg/day.  Not currently on SGLT2 therapy. Still elevated.  Increase spironolactone to 25 mg daily rather than 12.5 mg daily.  Basic metabolic panel is being obtained today.  We will determine if additional blood work needs to be done after this increase in spironolactone dose. He has discontinued smoking. He is on 3-prong therapy for systolic heart failure.  Only thing lacking is SGLT2.  Overall education and awareness concerning primary/secondary risk prevention was discussed in detail: LDL less than 70, hemoglobin A1c less than 7, blood pressure target less than 130/80 mmHg, >150 minutes of moderate aerobic activity per  week, avoidance of smoking, weight control (via diet and  exercise), and continued surveillance/management of/for obstructive sleep apnea.   Ileal hypercholesterolemia.  May need to consider referral to [facility] lipid clinic with Dr. Debara Pickett   Medication Adjustments/Labs and Tests Ordered: Current medicines are reviewed at length with the patient today.  Concerns regarding medicines are outlined above.  No orders of the defined types were placed in this encounter.  Meds ordered this encounter  Medications   spironolactone (ALDACTONE) 25 MG tablet    Sig: Take 1 tablet (25 mg total) by mouth daily.    Dispense:  90 tablet    Refill:  3    Patient Instructions  Medication Instructions:  Your physician has recommended you make the following change in your medication:   1) INCREASE Spironolactone '25mg'$  daily  *If you need a refill on your cardiac medications before your next appointment, please call your pharmacy*  Lab Work: NONE  Testing/Procedures: NONE  Follow-Up: At [facility], you and your health needs are our priority.  As part of our continuing mission to provide you with exceptional heart care, we have created designated Provider Care Teams.  These Care Teams include your primary Cardiologist (physician) and Advanced Practice Providers (APPs -  Physician Assistants and Nurse Practitioners) who all work together to provide you with the care you need, when you need it.  Your next appointment:   6 month(s)  The format for your next appointment:   In Person  Provider:   Sinclair Grooms, MD {   Important Information About Sugar         Signed, Sinclair Grooms, MD  03/25/2022 12:47 PM    [place]

## 2022-03-24 NOTE — Progress Notes (Signed)
Cardiology Office Note:    Date:  03/25/2022   ID:  Danny Kelley, DOB [birth year], MRN [id number]  PCP:  Angelina Sheriff, MD  Cardiologist:  Sinclair Grooms, MD   Referring MD: Angelina Sheriff, MD   Chief Complaint  Patient presents with   Coronary Artery Disease   Hyperlipidemia   Hypertension    History of Present Illness:    Danny Kelley is a 56 y.o. male with a hx of chronic systolic heart failure, hypertension, hyperlipidemia (statin intolerant), obstructive sleep apnea compliant with CPAP, tobacco use.  Patient has history of TIA in Feb 2018 while on anticoagulants, paroxysmal atrial fibrillation status post ablation 11/13/2016 and 03/01/2020 (Multaq and sotalol ineffective). Circumflex stents 2014 and 2019. Last cath 09/2021 where the LAD and right coronary was stented and the diagonal was angioplastied.  He reminds me that I take care of both his father and mother.   He is concerned about his blood pressure.  On this past Sunday he had a 30 to 40-minute episode of chest tightness that was eventually relieved after 2 sublingual nitroglycerin.'s cath in November demonstrated residual distal RCA disease that was not treated.  He has no exertional related chest discomfort.  There have been no recurrent episodes since Sunday.  He is accompanied today by his son.  At home blood pressures have been elevated with diastolics above 90 millimeter range.  Past Medical History:  Diagnosis Date   Arthritis    "a little in my right knee" (07/14/2016)   Atrial fibrillation (HCC)    CAD (coronary artery disease)    Chronic congestive heart failure with left ventricular diastolic dysfunction (HCC)    Complication of anesthesia    Pt reports "they have a hard time waking me up"   GERD (gastroesophageal reflux disease)    hx   Headache    "with every heart issue that I have" (07/14/2016)   High cholesterol    History of hiatal hernia 1990s   "fixed it w/scope down my throat"    Hypertension    Myocardial infarction ([place]) 05/2013   stents: left PDA, 1st OM at [facility]   Obesity    OSA (obstructive sleep apnea)    Persistent atrial fibrillation ([place])    Pneumonia ~ 1992   QT prolongation 07/15/2016   Seasonal allergies    "I take Allegra prn" (07/14/2016)   Stroke ([place])    Tobacco abuse 12/16/2016    Past Surgical History:  Procedure Laterality Date   APPENDECTOMY  1984   ATRIAL FIBRILLATION ABLATION N/A 03/01/2020   Procedure: ATRIAL FIBRILLATION ABLATION;  Surgeon: [name], MD;  Location: [place] [facility] LAB;  Service: Cardiovascular;  Laterality: N/A;   CARDIOVERSION N/A 05/27/2016   Procedure: CARDIOVERSION;  Surgeon: [name], MD;  Location: [facility] ENDOSCOPY;  Service: Cardiovascular;  Laterality: N/A;   CARDIOVERSION N/A 06/11/2016   Procedure: CARDIOVERSION;  Surgeon: [name], MD;  Location: [place];  Service: Cardiovascular;  Laterality: N/A;   CARDIOVERSION N/A 03/21/2020   Procedure: CARDIOVERSION;  Surgeon: Geralynn Rile, MD;  Location: [place];  Service: Cardiovascular;  Laterality: N/A;   CORONARY ANGIOPLASTY WITH STENT PLACEMENT  05/30/2013   "2 stents put in at [facility]" & /stent card   CORONARY STENT INTERVENTION N/A 11/27/2017   Procedure: CORONARY STENT INTERVENTION;  Surgeon: [name], MD;  Location: [place] CV LAB;  Service: Cardiovascular;  Laterality: N/A;   CORONARY STENT INTERVENTION N/A  03/21/2019   Procedure: CORONARY STENT INTERVENTION;  Surgeon: [name], MD;  Location: [place] CV LAB;  Service: Cardiovascular;  Laterality: N/A;   CORONARY STENT INTERVENTION N/A 08/13/2021   Procedure: CORONARY STENT INTERVENTION;  Surgeon: Nigel Mormon, MD;  Location: [place] CV LAB;  Service: Cardiovascular;  Laterality: N/A;   CORONARY STENT INTERVENTION N/A 09/17/2021   Procedure: CORONARY STENT INTERVENTION;  Surgeon: Nigel Mormon, MD;  Location: [place] CV LAB;  Service:  Cardiovascular;  Laterality: N/A;   ELECTROPHYSIOLOGIC STUDY N/A 11/13/2016   Procedure: Atrial Fibrillation Ablation;  Surgeon: [name], MD;  Location: [place] [facility];  Service: Cardiovascular;  Laterality: N/A;   ESOPHAGOGASTRODUODENOSCOPY (EGD) WITH ESOPHAGEAL DILATION  1990s X 2   INTRAVASCULAR IMAGING/OCT N/A 09/17/2021   Procedure: INTRAVASCULAR IMAGING/OCT;  Surgeon: Nigel Mormon, MD;  Location: [place] CV LAB;  Service: Cardiovascular;  Laterality: N/A;   INTRAVASCULAR PRESSURE WIRE/FFR STUDY N/A 09/21/2018   Procedure: INTRAVASCULAR PRESSURE WIRE/FFR STUDY;  Surgeon: [name], MD;  Location: [place] [facility] LAB;  Service: Cardiovascular;  Laterality: N/A;   INTRAVASCULAR PRESSURE WIRE/FFR STUDY N/A 03/21/2019   Procedure: INTRAVASCULAR PRESSURE WIRE/FFR STUDY;  Surgeon: [name], MD;  Location: [place] CV LAB;  Service: Cardiovascular;  Laterality: N/A;   INTRAVASCULAR PRESSURE WIRE/FFR STUDY N/A 01/24/2020   Procedure: INTRAVASCULAR PRESSURE WIRE/FFR STUDY;  Surgeon: [name], MD;  Location: [place] [facility] LAB;  Service: Cardiovascular;  Laterality: N/A;   INTRAVASCULAR ULTRASOUND/IVUS N/A 08/13/2021   Procedure: Intravascular Ultrasound/IVUS;  Surgeon: Nigel Mormon, MD;  Location: [place] CV LAB;  Service: Cardiovascular;  Laterality: N/A;   KNEE ARTHROSCOPY Right 1986; ~ 1995 X [place]  2015   LEFT HEART CATH AND CORONARY ANGIOGRAPHY N/A 11/27/2017   Procedure: LEFT HEART CATH AND CORONARY ANGIOGRAPHY;  Surgeon: [name], MD;  Location: [place] CV LAB;  Service: Cardiovascular;  Laterality: N/A;   LEFT HEART CATH AND CORONARY ANGIOGRAPHY N/A 09/21/2018   Procedure: LEFT HEART CATH AND CORONARY ANGIOGRAPHY;  Surgeon: [name], MD;  Location: [place] [facility] LAB;  Service: Cardiovascular;  Laterality: N/A;   LEFT HEART CATH AND CORONARY ANGIOGRAPHY N/A 03/21/2019   Procedure: LEFT HEART CATH AND CORONARY ANGIOGRAPHY;   Surgeon: [name], MD;  Location: [place] [facility] LAB;  Service: Cardiovascular;  Laterality: N/A;   REPAIR OF ESOPHAGUS  1998   perforation of the distal esophagus from bad heartburn   RIGHT/LEFT HEART CATH AND CORONARY ANGIOGRAPHY N/A 01/24/2020   Procedure: RIGHT/LEFT HEART CATH AND CORONARY ANGIOGRAPHY;  Surgeon: [name], MD;  Location: [place] [facility] LAB;  Service: Cardiovascular;  Laterality: N/A;   RIGHT/LEFT HEART CATH AND CORONARY ANGIOGRAPHY N/A 08/13/2021   Procedure: RIGHT/LEFT HEART CATH AND CORONARY ANGIOGRAPHY;  Surgeon: Nigel Mormon, MD;  Location: [place] [facility] LAB;  Service: Cardiovascular;  Laterality: N/A;   TEE WITHOUT CARDIOVERSION N/A 11/13/2016   Procedure: TRANSESOPHAGEAL ECHOCARDIOGRAM (TEE);  Surgeon: Thayer Headings, MD;  Location: [facility] ENDOSCOPY;  Service: Cardiovascular;  Laterality: N/A;    Current Medications: Current Meds  Medication Sig   acetaminophen (TYLENOL) 500 MG tablet Take 500 mg by mouth 2 (two) times daily as needed for moderate pain.   Alirocumab (PRALUENT) 75 MG/ML SOAJ Inject 75 mg into the skin every 14 (fourteen) days.   amLODipine (NORVASC) 10 MG tablet Take 1 tablet (10 mg total) by mouth daily.   apixaban (ELIQUIS) 5 MG TABS tablet Take 1 tablet (  5 mg total) by mouth 2 (two) times daily.   bisacodyl (DULCOLAX) 5 MG EC tablet Take 5 mg by mouth daily as needed.   clopidogrel (PLAVIX) 75 MG tablet Take 1 tablet (75 mg total) by mouth daily.   docusate sodium (COLACE) 100 MG capsule Take 100 mg by mouth 2 (two) times daily as needed.   fexofenadine (ALLEGRA) 180 MG tablet Take 180 mg by mouth daily as needed (seasonal allergies).   hydrALAZINE (APRESOLINE) 50 MG tablet TAKE ADDITIONAL DOSE AS NEEDED FOR BP GREATER THAN 150/90 MMHG   hydrochlorothiazide (MICROZIDE) 12.5 MG capsule Take 1 capsule (12.5 mg total) by mouth daily.   metoprolol tartrate (LOPRESSOR) 100 MG tablet Take 1 tablet (100 mg total) by mouth 2 (two) times daily.    nicotine (NICODERM CQ) 14 mg/24hr patch Place 1 patch (14 mg total) onto the skin daily.   nicotine (NICODERM CQ) 7 mg/24hr patch Place 1 patch (7 mg total) onto the skin daily. Please start after you have used up 40 mg dosage   nitroGLYCERIN (NITROSTAT) 0.4 MG SL tablet Place 1 tablet (0.4 mg total) under the tongue every 5 (five) minutes as needed for chest pain.   pantoprazole (PROTONIX) 20 MG tablet Take 1 tablet (20 mg total) by mouth 2 (two) times daily.   sacubitril-valsartan (ENTRESTO) 97-103 MG Take 1 tablet by mouth 2 (two) times daily.   spironolactone (ALDACTONE) 25 MG tablet Take 1 tablet (25 mg total) by mouth daily.   [DISCONTINUED] spironolactone (ALDACTONE) 25 MG tablet Take 0.5 tablets (12.5 mg total) by mouth daily.     Allergies:   Codeine, Crestor [rosuvastatin], Albuterol, Dilaudid [hydromorphone], Isosorbide, Lexapro [escitalopram], Lipitor [atorvastatin calcium], Erythromycin, and Oxycodone   Social History   Socioeconomic History   Marital status: Married    Spouse name: Not on file   Number of children: 2   Years of education: Not on file   Highest education level: Not on file  Occupational History   Not on file  Tobacco Use   Smoking status: Every Day    Packs/day: 0.25    Years: 25.00    Pack years: 6.25    Types: Cigarettes    Start date: 1989   Smokeless tobacco: Never  Vaping Use   Vaping Use: Never used  Substance and Sexual Activity   Alcohol use: No   Drug use: No   Sexual activity: Yes  Other Topics Concern   Not on file  Social History Narrative   Pt lives in [place] with spouse and 11 year old son.   disabled   Social Determinants of Radio broadcast assistant Strain: Not on file  Food Insecurity: Not on file  Transportation Needs: Not on file  Physical Activity: Not on file  Stress: Not on file  Social Connections: Not on file     Family History: The patient's family history includes Breast cancer in his mother; Cancer in  his father; Congestive Heart Failure in his father; Diabetes in his father and mother; Heart attack in his mother; Heart attack (age of onset: 71) in his father; Heart disease (age of onset: 91) in his mother; Hypertension in his father and mother; Lung cancer in his father and paternal uncle; Tongue cancer in his mother. There is no history of Colon cancer, Esophageal cancer, Inflammatory bowel disease, Liver disease, Pancreatic cancer, Rectal cancer, or Stomach cancer.  ROS:   Please see the history of present illness.    Had blood work drawn  today.  He has had spikes in blood pressure at home.  All other systems reviewed and are negative.  EKGs/Labs/Other Studies Reviewed:    The following studies were reviewed today:  CARDIAC CATH /PCI 09/2021: Diagnostic Dominance: Left Intervention   He was left with 60% stenosis proximal to the proximal LAD stent.  Distal territory is small. EKG:  EKG not performed  Recent Labs: 10/03/2021: ALT 21; BUN 16; Creatinine, Ser 0.79; Hemoglobin 16.1; Platelets 312; Potassium 3.4; Sodium 137  Recent Lipid Panel    Component Value Date/Time   CHOL 281 (H) 09/10/2021 1456   TRIG 141 09/10/2021 1456   HDL 53 09/10/2021 1456   CHOLHDL 5.0 11/27/2017 0634   VLDL 16 11/27/2017 0634   LDLCALC 202 (H) 09/10/2021 1456    Physical Exam:    VS:  BP (!) 132/100   Pulse 62   Ht '5\' 10"'$  (1.778 m)   Wt 239 lb 9.6 oz (108.7 kg)   SpO2 97%   BMI 34.38 kg/m     Wt Readings from Last 3 Encounters:  03/25/22 239 lb 9.6 oz (108.7 kg)  12/23/21 243 lb 9.6 oz (110.5 kg)  11/12/21 243 lb 6.4 oz (110.4 kg)     GEN: Obese. No acute distress HEENT: Normal NECK: No JVD. LYMPHATICS: No lymphadenopathy CARDIAC: No murmur. RRR S4 gallop, or edema. VASCULAR:  Normal Pulses. No bruits. RESPIRATORY:  Clear to auscultation without rales, wheezing or rhonchi  ABDOMEN: Soft, non-tender, non-distended, No pulsatile mass, MUSCULOSKELETAL: No deformity  SKIN: Warm and  dry NEUROLOGIC:  Alert and oriented x 3 PSYCHIATRIC:  Normal affect   ASSESSMENT:    1. Coronary artery disease of native artery of native heart with stable angina pectoris (HCC)   2. Persistent atrial fibrillation ([place])   3. Chronic combined systolic and diastolic heart failure ([place])   4. Essential hypertension   5. Tobacco abuse counseling   6. Nonischemic cardiomyopathy ([place])    PLAN:    In order of problems listed above:  I encouraged use of nitroglycerin.  Suspect that episode of discomfort was related to the distal RCA which was not treated at the time of the last cath because.  Could have restenosis proximal to the RCA stent which had residual disease after stent deployment.  Could also be having angina related to disease in the diagonal.  Plan use sublingual nitroglycerin.  If anginal complaints increase, may need to have repeat cath. No recurrent episodes of atrial fibrillation.  Had ablation with resolution.  Still taking Eliquis and Plavix.  Plavix is for the most recent stents that were deployed in November 2022.  The stents were LAD and RCA (orbital atherectomy followed by stent). Continue 4-prong therapy with metoprolol tartrate (probably needs to be converted to either carvedilol or metoprolol succinate), Entresto 97/103 mg tablets twice daily, Aldactone 12.5 mg/day.  Not currently on SGLT2 therapy. Still elevated.  Increase spironolactone to 25 mg daily rather than 12.5 mg daily.  Basic metabolic panel is being obtained today.  We will determine if additional blood work needs to be done after this increase in spironolactone dose. He has discontinued smoking. He is on 3-prong therapy for systolic heart failure.  Only thing lacking is SGLT2.  Overall education and awareness concerning primary/secondary risk prevention was discussed in detail: LDL less than 70, hemoglobin A1c less than 7, blood pressure target less than 130/80 mmHg, >150 minutes of moderate aerobic activity per  week, avoidance of smoking, weight control (via diet and  exercise), and continued surveillance/management of/for obstructive sleep apnea.   Ileal hypercholesterolemia.  May need to consider referral to [facility] lipid clinic with Dr. Debara Pickett   Medication Adjustments/Labs and Tests Ordered: Current medicines are reviewed at length with the patient today.  Concerns regarding medicines are outlined above.  No orders of the defined types were placed in this encounter.  Meds ordered this encounter  Medications   spironolactone (ALDACTONE) 25 MG tablet    Sig: Take 1 tablet (25 mg total) by mouth daily.    Dispense:  90 tablet    Refill:  3    Patient Instructions  Medication Instructions:  Your physician has recommended you make the following change in your medication:   1) INCREASE Spironolactone '25mg'$  daily  *If you need a refill on your cardiac medications before your next appointment, please call your pharmacy*  Lab Work: NONE  Testing/Procedures: NONE  Follow-Up: At [facility], you and your health needs are our priority.  As part of our continuing mission to provide you with exceptional heart care, we have created designated Provider Care Teams.  These Care Teams include your primary Cardiologist (physician) and Advanced Practice Providers (APPs -  Physician Assistants and Nurse Practitioners) who all work together to provide you with the care you need, when you need it.  Your next appointment:   6 month(s)  The format for your next appointment:   In Person  Provider:   Sinclair Grooms, MD {   Important Information About [place]         Signed, Sinclair Grooms, MD  03/25/2022 12:47 PM    [place]

## 2022-03-25 ENCOUNTER — Other Ambulatory Visit: Payer: Medicare Other | Admitting: *Deleted

## 2022-03-25 ENCOUNTER — Ambulatory Visit (INDEPENDENT_AMBULATORY_CARE_PROVIDER_SITE_OTHER): Payer: Medicare Other | Admitting: Interventional Cardiology

## 2022-03-25 ENCOUNTER — Encounter: Payer: Self-pay | Admitting: Interventional Cardiology

## 2022-03-25 VITALS — BP 132/100 | HR 62 | Ht 70.0 in | Wt 239.6 lb

## 2022-03-25 DIAGNOSIS — I10 Essential (primary) hypertension: Secondary | ICD-10-CM | POA: Diagnosis not present

## 2022-03-25 DIAGNOSIS — Z716 Tobacco abuse counseling: Secondary | ICD-10-CM

## 2022-03-25 DIAGNOSIS — I25118 Atherosclerotic heart disease of native coronary artery with other forms of angina pectoris: Secondary | ICD-10-CM | POA: Diagnosis not present

## 2022-03-25 DIAGNOSIS — I5042 Chronic combined systolic (congestive) and diastolic (congestive) heart failure: Secondary | ICD-10-CM | POA: Diagnosis not present

## 2022-03-25 DIAGNOSIS — I4819 Other persistent atrial fibrillation: Secondary | ICD-10-CM

## 2022-03-25 DIAGNOSIS — I428 Other cardiomyopathies: Secondary | ICD-10-CM

## 2022-03-25 DIAGNOSIS — Z79899 Other long term (current) drug therapy: Secondary | ICD-10-CM | POA: Diagnosis not present

## 2022-03-25 LAB — BASIC METABOLIC PANEL
BUN/Creatinine Ratio: 15 (ref 9–20)
BUN: 12 mg/dL (ref 6–24)
CO2: 25 mmol/L (ref 20–29)
Calcium: 9.4 mg/dL (ref 8.7–10.2)
Chloride: 101 mmol/L (ref 96–106)
Creatinine, Ser: 0.8 mg/dL (ref 0.76–1.27)
Glucose: 171 mg/dL — ABNORMAL HIGH (ref 70–99)
Potassium: 4.3 mmol/L (ref 3.5–5.2)
Sodium: 140 mmol/L (ref 134–144)
eGFR: 106 mL/min/{1.73_m2} (ref >59)

## 2022-03-25 MED ORDER — SPIRONOLACTONE 25 MG PO TABS: 25.0000 mg | ORAL_TABLET | Freq: Every day | ORAL | 3 refills | Status: DC

## 2022-03-25 NOTE — Patient Instructions (Signed)
Medication Instructions:  Your physician has recommended you make the following change in your medication:   1) INCREASE Spironolactone '25mg'$  daily  *If you need a refill on your cardiac medications before your next appointment, please call your pharmacy*  Lab Work: NONE  Testing/Procedures: NONE  Follow-Up: At [facility], you and your health needs are our priority.  As part of our continuing mission to provide you with exceptional heart care, we have created designated Provider Care Teams.  These Care Teams include your primary Cardiologist (physician) and Advanced Practice Providers (APPs -  Physician Assistants and Nurse Practitioners) who all work together to provide you with the care you need, when you need it.  Your next appointment:   6 month(s)  The format for your next appointment:   In Person  Provider:   Sinclair Grooms, MD {   Important Information About [place]

## 2022-03-26 ENCOUNTER — Encounter: Payer: Self-pay | Admitting: Interventional Cardiology

## 2022-03-27 ENCOUNTER — Other Ambulatory Visit: Payer: Self-pay | Admitting: Interventional Cardiology

## 2022-03-27 ENCOUNTER — Other Ambulatory Visit: Payer: Self-pay

## 2022-03-27 DIAGNOSIS — Z7901 Long term (current) use of anticoagulants: Secondary | ICD-10-CM

## 2022-03-27 DIAGNOSIS — Z01812 Encounter for preprocedural laboratory examination: Secondary | ICD-10-CM

## 2022-03-27 NOTE — Telephone Encounter (Signed)
Please see Dr Thompson Caul comments below for 03/25/22 visit  I encouraged use of nitroglycerin.  Suspect that episode of discomfort was related to the distal RCA which was not treated at the time of the last cath because.  Could have restenosis proximal to the RCA stent which had residual disease after stent deployment.  Could also be having angina related to disease in the diagonal.  Plan use sublingual nitroglycerin.  If anginal complaints increase, may need to have repeat cath.

## 2022-03-27 NOTE — Progress Notes (Signed)
CBC ordered for heart cath on 04/04/22. Released to [place], patient to have drawn at [facility] in [place] on 03/28/22.

## 2022-03-28 ENCOUNTER — Other Ambulatory Visit: Payer: Medicare Other

## 2022-04-03 NOTE — H&P (Signed)
Recurrent CP at rest responsive to SL NTG LAD, CFX, and RCA stents

## 2022-04-04 ENCOUNTER — Encounter (HOSPITAL_COMMUNITY)
Admission: RE | Disposition: A | Discharge status: Home / Self Care | Payer: Self-pay | Source: Home / Self Care | Attending: Interventional Cardiology

## 2022-04-04 ENCOUNTER — Ambulatory Visit (HOSPITAL_COMMUNITY)
Admission: RE | Admit: 2022-04-04 | Discharge: 2022-04-04 | Disposition: A | Discharge status: Home / Self Care | Payer: Medicare Other | Attending: Interventional Cardiology | Admitting: Interventional Cardiology

## 2022-04-04 ENCOUNTER — Other Ambulatory Visit: Payer: Self-pay

## 2022-04-04 DIAGNOSIS — E785 Hyperlipidemia, unspecified: Secondary | ICD-10-CM | POA: Diagnosis not present

## 2022-04-04 DIAGNOSIS — Z8673 Personal history of transient ischemic attack (TIA), and cerebral infarction without residual deficits: Secondary | ICD-10-CM | POA: Insufficient documentation

## 2022-04-04 DIAGNOSIS — F17210 Nicotine dependence, cigarettes, uncomplicated: Secondary | ICD-10-CM | POA: Insufficient documentation

## 2022-04-04 DIAGNOSIS — Z7901 Long term (current) use of anticoagulants: Secondary | ICD-10-CM | POA: Insufficient documentation

## 2022-04-04 DIAGNOSIS — Z79899 Other long term (current) drug therapy: Secondary | ICD-10-CM | POA: Insufficient documentation

## 2022-04-04 DIAGNOSIS — I5042 Chronic combined systolic (congestive) and diastolic (congestive) heart failure: Secondary | ICD-10-CM | POA: Diagnosis not present

## 2022-04-04 DIAGNOSIS — G4733 Obstructive sleep apnea (adult) (pediatric): Secondary | ICD-10-CM | POA: Insufficient documentation

## 2022-04-04 DIAGNOSIS — I428 Other cardiomyopathies: Secondary | ICD-10-CM | POA: Insufficient documentation

## 2022-04-04 DIAGNOSIS — I4819 Other persistent atrial fibrillation: Secondary | ICD-10-CM | POA: Diagnosis not present

## 2022-04-04 DIAGNOSIS — I25110 Atherosclerotic heart disease of native coronary artery with unstable angina pectoris: Secondary | ICD-10-CM | POA: Insufficient documentation

## 2022-04-04 DIAGNOSIS — I200 Unstable angina: Secondary | ICD-10-CM | POA: Diagnosis present

## 2022-04-04 DIAGNOSIS — Z955 Presence of coronary angioplasty implant and graft: Secondary | ICD-10-CM | POA: Diagnosis not present

## 2022-04-04 DIAGNOSIS — Z7902 Long term (current) use of antithrombotics/antiplatelets: Secondary | ICD-10-CM | POA: Diagnosis not present

## 2022-04-04 DIAGNOSIS — I110 Hypertensive heart disease with heart failure: Secondary | ICD-10-CM | POA: Diagnosis not present

## 2022-04-04 DIAGNOSIS — R0789 Other chest pain: Secondary | ICD-10-CM

## 2022-04-04 HISTORY — PX: LEFT HEART CATH AND CORONARY ANGIOGRAPHY: CATH118249

## 2022-04-04 HISTORY — PX: INTRAVASCULAR PRESSURE WIRE/FFR STUDY: CATH118243

## 2022-04-04 LAB — CBC
HCT: 49.1 % (ref 39.0–52.0)
Hemoglobin: 16.6 g/dL (ref 13.0–17.0)
MCH: 28.6 pg (ref 26.0–34.0)
MCHC: 33.8 g/dL (ref 30.0–36.0)
MCV: 84.7 fL (ref 80.0–100.0)
Platelets: 272 10*3/uL (ref 150–400)
RBC: 5.8 MIL/uL (ref 4.22–5.81)
RDW: 13.3 % (ref 11.5–15.5)
WBC: 14.8 10*3/uL — ABNORMAL HIGH (ref 4.0–10.5)
nRBC: 0 % (ref 0.0–0.2)

## 2022-04-04 LAB — POCT ACTIVATED CLOTTING TIME: Activated Clotting Time: 263 seconds

## 2022-04-04 SURGERY — LEFT HEART CATH AND CORONARY ANGIOGRAPHY
Anesthesia: LOCAL

## 2022-04-04 MED ORDER — SODIUM CHLORIDE 0.9 % IV SOLN: INTRAVENOUS | Status: DC

## 2022-04-04 MED ORDER — ISOSORBIDE MONONITRATE ER 30 MG PO TB24: 30.0000 mg | ORAL_TABLET | Freq: Every day | ORAL | 11 refills | Status: DC

## 2022-04-04 MED ORDER — ACETAMINOPHEN 325 MG PO TABS: 650.0000 mg | ORAL_TABLET | ORAL | Status: DC | PRN

## 2022-04-04 MED ORDER — FENTANYL CITRATE (PF) 100 MCG/2ML IJ SOLN
INTRAMUSCULAR | Status: DC | PRN
  Administered 2022-04-04: 25 ug via INTRAVENOUS

## 2022-04-04 MED ORDER — SODIUM CHLORIDE 0.9 % WEIGHT BASED INFUSION
1.0000 mL/kg/h | INTRAVENOUS | Status: DC
  Administered 2022-04-04: 1 mL/kg/h via INTRAVENOUS

## 2022-04-04 MED ORDER — SODIUM CHLORIDE 0.9 % IV SOLN: 250.0000 mL | INTRAVENOUS | Status: DC | PRN

## 2022-04-04 MED ORDER — MIDAZOLAM HCL 2 MG/2ML IJ SOLN
INTRAMUSCULAR | Status: DC | PRN
  Administered 2022-04-04 (×2): 1 mg via INTRAVENOUS

## 2022-04-04 MED ORDER — HEPARIN SODIUM (PORCINE) 1000 UNIT/ML IJ SOLN
INTRAMUSCULAR | Status: DC | PRN
  Administered 2022-04-04: 3000 [IU] via INTRAVENOUS
  Administered 2022-04-04: 5000 [IU] via INTRAVENOUS
  Administered 2022-04-04: 1500 [IU] via INTRAVENOUS

## 2022-04-04 MED ORDER — HYDRALAZINE HCL 20 MG/ML IJ SOLN: 10.0000 mg | INTRAMUSCULAR | Status: DC | PRN

## 2022-04-04 MED ORDER — HEPARIN SODIUM (PORCINE) 1000 UNIT/ML IJ SOLN
INTRAMUSCULAR | Status: AC
Start: 2022-04-04 — End: ?
  Filled 2022-04-04: qty 10

## 2022-04-04 MED ORDER — SODIUM CHLORIDE 0.9% FLUSH: 3.0000 mL | Freq: Two times a day (BID) | INTRAVENOUS | Status: DC

## 2022-04-04 MED ORDER — SODIUM CHLORIDE 0.9% FLUSH: 3.0000 mL | INTRAVENOUS | Status: DC | PRN

## 2022-04-04 MED ORDER — IOHEXOL 350 MG/ML SOLN
INTRAVENOUS | Status: DC | PRN
  Administered 2022-04-04: 105 mL

## 2022-04-04 MED ORDER — LIDOCAINE HCL (PF) 1 % IJ SOLN
INTRAMUSCULAR | Status: AC
Start: 2022-04-04 — End: ?
  Filled 2022-04-04: qty 30

## 2022-04-04 MED ORDER — LABETALOL HCL 5 MG/ML IV SOLN: 10.0000 mg | INTRAVENOUS | Status: DC | PRN

## 2022-04-04 MED ORDER — ASPIRIN 81 MG PO CHEW: 81.0000 mg | CHEWABLE_TABLET | ORAL | Status: DC

## 2022-04-04 MED ORDER — HEPARIN (PORCINE) IN NACL 1000-0.9 UT/500ML-% IV SOLN
INTRAVENOUS | Status: AC
  Filled 2022-04-04: qty 1000

## 2022-04-04 MED ORDER — SODIUM CHLORIDE 0.9 % WEIGHT BASED INFUSION
3.0000 mL/kg/h | INTRAVENOUS | Status: AC
  Administered 2022-04-04: 3 mL/kg/h via INTRAVENOUS

## 2022-04-04 MED ORDER — FENTANYL CITRATE (PF) 100 MCG/2ML IJ SOLN
INTRAMUSCULAR | Status: AC
  Filled 2022-04-04: qty 2

## 2022-04-04 MED ORDER — ONDANSETRON HCL 4 MG/2ML IJ SOLN: 4.0000 mg | Freq: Four times a day (QID) | INTRAMUSCULAR | Status: DC | PRN

## 2022-04-04 MED ORDER — SODIUM CHLORIDE 0.9% FLUSH
3.0000 mL | INTRAVENOUS | Status: DC | PRN
Start: 2022-04-04 — End: 2022-04-04

## 2022-04-04 MED ORDER — VERAPAMIL HCL 2.5 MG/ML IV SOLN
INTRAVENOUS | Status: DC | PRN
  Administered 2022-04-04: 10 mL via INTRA_ARTERIAL

## 2022-04-04 MED ORDER — LIDOCAINE HCL (PF) 1 % IJ SOLN
INTRAMUSCULAR | Status: DC | PRN
  Administered 2022-04-04: 5 mL

## 2022-04-04 MED ORDER — MIDAZOLAM HCL 2 MG/2ML IJ SOLN
INTRAMUSCULAR | Status: AC
  Filled 2022-04-04: qty 2

## 2022-04-04 MED ORDER — HEPARIN (PORCINE) IN NACL 1000-0.9 UT/500ML-% IV SOLN
INTRAVENOUS | Status: DC | PRN
  Administered 2022-04-04 (×2): 500 mL

## 2022-04-04 MED ORDER — VERAPAMIL HCL 2.5 MG/ML IV SOLN
INTRAVENOUS | Status: AC
Start: 2022-04-04 — End: ?
  Filled 2022-04-04: qty 2

## 2022-04-04 SURGICAL SUPPLY — 13 items
BAND ZEPHYR COMPRESS 30 LONG (HEMOSTASIS) ×1 IMPLANT
CATH 5FR JL3.5 JR4 ANG PIG MP (CATHETERS) ×1 IMPLANT
CATH VISTA GUIDE 6FR XBLAD3.5 (CATHETERS) ×1 IMPLANT
GLIDESHEATH SLEND A-KIT 6F 22G (SHEATH) ×1 IMPLANT
GUIDEWIRE INQWIRE 1.5J.035X260 (WIRE) IMPLANT
GUIDEWIRE PRESSURE X 175 (WIRE) ×1 IMPLANT
INQWIRE 1.5J .035X260CM (WIRE) ×2
KIT ENCORE 26 ADVANTAGE (KITS) ×1 IMPLANT
KIT HEART LEFT (KITS) ×2 IMPLANT
PACK CARDIAC CATHETERIZATION (CUSTOM PROCEDURE TRAY) ×2 IMPLANT
SHEATH PROBE COVER 6X72 (BAG) ×1 IMPLANT
TRANSDUCER W/STOPCOCK (MISCELLANEOUS) ×2 IMPLANT
TUBING CIL FLEX 10 FLL-RA (TUBING) ×2 IMPLANT

## 2022-04-04 NOTE — CV Procedure (Signed)
No significant change in anatomy since the last catheterization in late 2011. Eccentric 50% proximal LAD RFR 0.97 The second diagonal is severely diseased and has restenosed since angioplasty in November. The apical LAD has 95% obstruction. Distal circumflex proximal to the PDA contains eccentric 50% narrowing all 4 large obtuse marginal branches are widely patent including the stent in the second marginal and in the mid circumflex that jails the second marginal. Nondominant RCA is widely patent without evidence of in-stent restenosis Overall normal LV function.

## 2022-04-04 NOTE — Discharge Instructions (Signed)

## 2022-04-04 NOTE — Interval H&P Note (Signed)
Cath Lab Visit (complete for each Cath Lab visit)  Clinical Evaluation Leading to the Procedure:   ACS: Yes.    Non-ACS:    Anginal Classification: CCS Kelley  Anti-ischemic medical therapy: Minimal Therapy (1 class of medications)  Non-Invasive Test Results: No non-invasive testing performed  Prior CABG: No previous CABG      History and Physical Interval Note:  04/04/2022 11:35 AM  Danny Kelley  has presented today for surgery, with the diagnosis of unstable angina.  The various methods of treatment have been discussed with the patient and family. After consideration of risks, benefits and other options for treatment, the patient has consented to  Procedure(s): LEFT HEART CATH AND CORONARY ANGIOGRAPHY (N/A) as a surgical intervention.  The patient's history has been reviewed, patient examined, no change in status, stable for surgery.  I have reviewed the patient's chart and labs.  Questions were answered to the patient's satisfaction.     Danny Kelley

## 2022-04-05 ENCOUNTER — Encounter: Payer: Self-pay | Admitting: Interventional Cardiology

## 2022-04-05 DIAGNOSIS — I5042 Chronic combined systolic (congestive) and diastolic (congestive) heart failure: Secondary | ICD-10-CM

## 2022-04-05 DIAGNOSIS — I5041 Acute combined systolic (congestive) and diastolic (congestive) heart failure: Secondary | ICD-10-CM

## 2022-04-05 DIAGNOSIS — I25118 Atherosclerotic heart disease of native coronary artery with other forms of angina pectoris: Secondary | ICD-10-CM

## 2022-04-05 DIAGNOSIS — I4821 Permanent atrial fibrillation: Secondary | ICD-10-CM

## 2022-04-05 DIAGNOSIS — I10 Essential (primary) hypertension: Secondary | ICD-10-CM

## 2022-04-05 DIAGNOSIS — E7800 Pure hypercholesterolemia, unspecified: Secondary | ICD-10-CM

## 2022-04-07 ENCOUNTER — Other Ambulatory Visit: Payer: Self-pay | Admitting: Pharmacist

## 2022-04-07 ENCOUNTER — Encounter (HOSPITAL_COMMUNITY): Payer: Self-pay | Admitting: Interventional Cardiology

## 2022-04-07 DIAGNOSIS — I10 Essential (primary) hypertension: Secondary | ICD-10-CM

## 2022-04-07 MED ORDER — AMLODIPINE BESYLATE 10 MG PO TABS: 10.0000 mg | ORAL_TABLET | Freq: Every day | ORAL | 3 refills | Status: DC

## 2022-04-07 MED ORDER — APIXABAN 5 MG PO TABS: 5.0000 mg | ORAL_TABLET | Freq: Two times a day (BID) | ORAL | 1 refills | Status: DC

## 2022-04-07 MED ORDER — NITROGLYCERIN 0.4 MG SL SUBL
0.4000 mg | SUBLINGUAL_TABLET | SUBLINGUAL | 11 refills | Status: DC | PRN
Start: 2022-04-07 — End: 2024-02-25

## 2022-04-07 MED ORDER — METOPROLOL TARTRATE 100 MG PO TABS: 100.0000 mg | ORAL_TABLET | Freq: Two times a day (BID) | ORAL | 3 refills | Status: DC

## 2022-04-07 MED ORDER — PANTOPRAZOLE SODIUM 20 MG PO TBEC: 20.0000 mg | DELAYED_RELEASE_TABLET | Freq: Two times a day (BID) | ORAL | 3 refills | Status: DC

## 2022-04-07 MED ORDER — CLOPIDOGREL BISULFATE 75 MG PO TABS: 75.0000 mg | ORAL_TABLET | Freq: Every day | ORAL | 3 refills | Status: DC

## 2022-04-07 MED ORDER — HYDRALAZINE HCL 50 MG PO TABS: ORAL_TABLET | ORAL | 3 refills | Status: DC

## 2022-04-07 MED ORDER — PRALUENT 75 MG/ML ~~LOC~~ SOAJ: 75.0000 mg | SUBCUTANEOUS | 11 refills | Status: DC

## 2022-04-07 NOTE — Telephone Encounter (Signed)
Pt's medications were sent to pt's pharmacy as requested. Confirmation received.  

## 2022-04-09 NOTE — Addendum Note (Signed)
Addended by: Molli Barrows on: 04/09/2022 08:49 AM   Modules accepted: Orders

## 2022-04-14 ENCOUNTER — Other Ambulatory Visit: Payer: Medicare Other

## 2022-04-18 ENCOUNTER — Other Ambulatory Visit: Payer: Medicare Other | Admitting: *Deleted

## 2022-04-18 ENCOUNTER — Other Ambulatory Visit: Payer: Self-pay | Admitting: *Deleted

## 2022-04-18 ENCOUNTER — Encounter: Payer: Self-pay | Admitting: Interventional Cardiology

## 2022-04-18 DIAGNOSIS — I10 Essential (primary) hypertension: Secondary | ICD-10-CM | POA: Diagnosis not present

## 2022-04-18 LAB — BASIC METABOLIC PANEL
BUN/Creatinine Ratio: 19 (ref 9–20)
BUN: 14 mg/dL (ref 6–24)
CO2: 27 mmol/L (ref 20–29)
Calcium: 9.4 mg/dL (ref 8.7–10.2)
Chloride: 98 mmol/L (ref 96–106)
Creatinine, Ser: 0.75 mg/dL — ABNORMAL LOW (ref 0.76–1.27)
Glucose: 257 mg/dL — ABNORMAL HIGH (ref 70–99)
Potassium: 3.6 mmol/L (ref 3.5–5.2)
Sodium: 138 mmol/L (ref 134–144)
eGFR: 108 mL/min/{1.73_m2} (ref >59)

## 2022-06-02 DIAGNOSIS — G2581 Restless legs syndrome: Secondary | ICD-10-CM | POA: Diagnosis not present

## 2022-06-02 DIAGNOSIS — R252 Cramp and spasm: Secondary | ICD-10-CM | POA: Diagnosis not present

## 2022-06-02 DIAGNOSIS — Z0000 Encounter for general adult medical examination without abnormal findings: Secondary | ICD-10-CM | POA: Diagnosis not present

## 2022-06-02 DIAGNOSIS — R413 Other amnesia: Secondary | ICD-10-CM | POA: Diagnosis not present

## 2022-06-11 ENCOUNTER — Encounter: Payer: Self-pay | Admitting: Interventional Cardiology

## 2022-06-13 MED ORDER — ISOSORBIDE MONONITRATE ER 60 MG PO TB24
60.0000 mg | ORAL_TABLET | Freq: Every day | ORAL | 3 refills | Status: DC
Start: 2022-06-13 — End: 2023-04-16

## 2022-09-04 DIAGNOSIS — J329 Chronic sinusitis, unspecified: Secondary | ICD-10-CM | POA: Diagnosis not present

## 2022-09-04 DIAGNOSIS — I2510 Atherosclerotic heart disease of native coronary artery without angina pectoris: Secondary | ICD-10-CM | POA: Diagnosis not present

## 2022-09-04 DIAGNOSIS — J40 Bronchitis, not specified as acute or chronic: Secondary | ICD-10-CM | POA: Diagnosis not present

## 2022-09-28 ENCOUNTER — Other Ambulatory Visit: Payer: Self-pay | Admitting: Interventional Cardiology

## 2022-09-29 NOTE — Telephone Encounter (Signed)
Prescription refill request for Eliquis received. Indication:afib Last office visit:5/23 Scr:0.7 Age: 56 Weight:108.4  kg  Prescription refilled

## 2022-09-30 DIAGNOSIS — T466X5A Adverse effect of antihyperlipidemic and antiarteriosclerotic drugs, initial encounter: Secondary | ICD-10-CM

## 2022-09-30 NOTE — Progress Notes (Signed)
[name])                                            [place] Team                                        Statin Quality Measure Assessment    09/30/2022  Danny Kelley 01/09/1966 [id number]  Dr. Tamala Julian,   I am a [facility] clinical pharmacist that reviews patients for statin quality initiatives.     Per review of chart and payor information, patient has a diagnosis of cardiovascular disease but is not currently filling a statin prescription.  This places patient into the [facility] (Statin Use In Patients with Cardiovascular Disease) measure for CMS.    Patient has documented trials of multiple statins with reported severe myalgias, but no corresponding CPT codes that would exclude patient from [facility] measure.     Component Value Date/Time   CHOL 281 (H) 09/10/2021 1456   TRIG 141 09/10/2021 1456   HDL 53 09/10/2021 1456   CHOLHDL 5.0 11/27/2017 0634   VLDL 16 11/27/2017 0634   LDLCALC 202 (H) 09/10/2021 1456     Please consider ONE of the following recommendations:  Initiate high intensity statin Atorvastatin '40mg'$  once daily, #90, 3 refills   Rosuvastatin '20mg'$  once daily, #90, 3 refills    Initiate moderate intensity  statin with reduced frequency if prior  statin intolerance 1x weekly, #13, 3 refills   2x weekly, #26, 3 refills   3x weekly, #39, 3 refills    Code for past statin intolerance  (required annually)  Provider Requirements: Must asociate code during an office visit or telehealth encounter   Drug Induced Myopathy G72.0   Myalgia M79.1   Myositis, unspecified M60.9   Myopathy, unspecified G72.9   Rhabdomyolysis M62.82     Please let us know your decision.    Thank you!   Reed Breech, PharmD Clinical Pharmacist  [place] [phone]

## 2022-09-30 NOTE — Progress Notes (Unsigned)
Cardiology Office Note:    Date:  10/01/2022   ID:  Danny Kelley, DOB [birth year], MRN [id number]  PCP:  Angelina Sheriff, MD  Cardiologist:  Sinclair Grooms, MD   Referring MD: Angelina Sheriff, MD   Chief Complaint  Patient presents with   Coronary Artery Disease   Congestive Heart Failure    History of Present Illness:    Danny Kelley is a 56 y.o. male with a hx of chronic systolic heart failure => physician to chronic diastolic heart failure, hypertension, hyperlipidemia (statin intolerant), obstructive sleep apnea compliant with CPAP, tobacco use.  Patient has history of TIA in Feb 2018 while on anticoagulants, paroxysmal atrial fibrillation status post ablation 11/13/2016 and 03/01/2020 (Multaq and sotalol ineffective). Circumflex stents 2014 and 2019. Last cath 09/2021 where the LAD and right coronary was stented and the diagonal was angioplastied.   Unable to use statin therapy due to myalgias.  Heart reduction and anginal frequency and nitroglycerin use.  No exertional angina.  Compliant with his medication regimen.  Denies orthopnea and PND  Past Medical History:  Diagnosis Date   Arthritis    "a little in my right knee" (07/14/2016)   Atrial fibrillation (HCC)    CAD (coronary artery disease)    Chronic congestive heart failure with left ventricular diastolic dysfunction (HCC)    Complication of anesthesia    Pt reports "they have a hard time waking me up"   GERD (gastroesophageal reflux disease)    hx   Headache    "with every heart issue that I have" (07/14/2016)   High cholesterol    History of hiatal hernia 1990s   "fixed it w/scope down my throat"   Hypertension    Myocardial infarction ([place]) 05/2013   stents: left PDA, 1st OM at [facility]   Obesity    OSA (obstructive sleep apnea)    Persistent atrial fibrillation ([place])    Pneumonia ~ 1992   QT prolongation 07/15/2016   Seasonal allergies    "I take Allegra prn" (07/14/2016)    Stroke ([place])    Tobacco abuse 12/16/2016    Past Surgical History:  Procedure Laterality Date   APPENDECTOMY  1984   ATRIAL FIBRILLATION ABLATION N/A 03/01/2020   Procedure: ATRIAL FIBRILLATION ABLATION;  Surgeon: [name], MD;  Location: [place] [facility];  Service: Cardiovascular;  Laterality: N/A;   CARDIOVERSION N/A 05/27/2016   Procedure: CARDIOVERSION;  Surgeon: [name], MD;  Location: [facility] ENDOSCOPY;  Service: Cardiovascular;  Laterality: N/A;   CARDIOVERSION N/A 06/11/2016   Procedure: CARDIOVERSION;  Surgeon: [name], MD;  Location: [place];  Service: Cardiovascular;  Laterality: N/A;   CARDIOVERSION N/A 03/21/2020   Procedure: CARDIOVERSION;  Surgeon: Geralynn Rile, MD;  Location: [place];  Service: Cardiovascular;  Laterality: N/A;   CORONARY ANGIOPLASTY WITH STENT PLACEMENT  05/30/2013   "2 stents put in at [facility]" & /stent card   CORONARY STENT INTERVENTION N/A 11/27/2017   Procedure: CORONARY STENT INTERVENTION;  Surgeon: [name], MD;  Location: [place] [facility];  Service: Cardiovascular;  Laterality: N/A;   CORONARY STENT INTERVENTION N/A 03/21/2019   Procedure: CORONARY STENT INTERVENTION;  Surgeon: [name], MD;  Location: [place] [facility];  Service: Cardiovascular;  Laterality: N/A;   CORONARY STENT INTERVENTION N/A 08/13/2021   Procedure: CORONARY STENT INTERVENTION;  Surgeon: Nigel Mormon, MD;  Location: [place] [facility];  Service: Cardiovascular;  Laterality: N/A;  CORONARY STENT INTERVENTION N/A 09/17/2021   Procedure: CORONARY STENT INTERVENTION;  Surgeon: Nigel Mormon, MD;  Location: [place] [facility];  Service: Cardiovascular;  Laterality: N/A;   ELECTROPHYSIOLOGIC STUDY N/A 11/13/2016   Procedure: Atrial Fibrillation Ablation;  Surgeon: [name], MD;  Location: [place] [facility];  Service: Cardiovascular;  Laterality: N/A;   ESOPHAGOGASTRODUODENOSCOPY (EGD) WITH ESOPHAGEAL DILATION  1990s X 2   INTRAVASCULAR  IMAGING/OCT N/A 09/17/2021   Procedure: INTRAVASCULAR IMAGING/OCT;  Surgeon: Nigel Mormon, MD;  Location: [place] [facility];  Service: Cardiovascular;  Laterality: N/A;   INTRAVASCULAR PRESSURE WIRE/FFR STUDY N/A 09/21/2018   Procedure: INTRAVASCULAR PRESSURE WIRE/FFR STUDY;  Surgeon: [name], MD;  Location: [place] [facility];  Service: Cardiovascular;  Laterality: N/A;   INTRAVASCULAR PRESSURE WIRE/FFR STUDY N/A 03/21/2019   Procedure: INTRAVASCULAR PRESSURE WIRE/FFR STUDY;  Surgeon: [name], MD;  Location: [place] [facility];  Service: Cardiovascular;  Laterality: N/A;   INTRAVASCULAR PRESSURE WIRE/FFR STUDY N/A 01/24/2020   Procedure: INTRAVASCULAR PRESSURE WIRE/FFR STUDY;  Surgeon: [name], MD;  Location: [place] [facility];  Service: Cardiovascular;  Laterality: N/A;   INTRAVASCULAR PRESSURE WIRE/FFR STUDY N/A 04/04/2022   Procedure: INTRAVASCULAR PRESSURE WIRE/FFR STUDY;  Surgeon: Belva Crome, MD;  Location: [place] [facility];  Service: Cardiovascular;  Laterality: N/A;   INTRAVASCULAR ULTRASOUND/IVUS N/A 08/13/2021   Procedure: Intravascular Ultrasound/IVUS;  Surgeon: Nigel Mormon, MD;  Location: [place] [facility];  Service: Cardiovascular;  Laterality: N/A;   KNEE ARTHROSCOPY Right 1986; ~ 1995 X [place]  2015   LEFT HEART CATH AND CORONARY ANGIOGRAPHY N/A 11/27/2017   Procedure: LEFT HEART CATH AND CORONARY ANGIOGRAPHY;  Surgeon: [name], MD;  Location: [place] [facility];  Service: Cardiovascular;  Laterality: N/A;   LEFT HEART CATH AND CORONARY ANGIOGRAPHY N/A 09/21/2018   Procedure: LEFT HEART CATH AND CORONARY ANGIOGRAPHY;  Surgeon: [name], MD;  Location: [place] [facility];  Service: Cardiovascular;  Laterality: N/A;   LEFT HEART CATH AND CORONARY ANGIOGRAPHY N/A 03/21/2019   Procedure: LEFT HEART CATH AND CORONARY ANGIOGRAPHY;  Surgeon: [name], MD;  Location: [place] [facility];  Service: Cardiovascular;  Laterality: N/A;    LEFT HEART CATH AND CORONARY ANGIOGRAPHY N/A 04/04/2022   Procedure: LEFT HEART CATH AND CORONARY ANGIOGRAPHY;  Surgeon: Belva Crome, MD;  Location: [place] [facility];  Service: Cardiovascular;  Laterality: N/A;   REPAIR OF ESOPHAGUS  1998   perforation of the distal esophagus from bad heartburn   RIGHT/LEFT HEART CATH AND CORONARY ANGIOGRAPHY N/A 01/24/2020   Procedure: RIGHT/LEFT HEART CATH AND CORONARY ANGIOGRAPHY;  Surgeon: [name], MD;  Location: [place] [facility];  Service: Cardiovascular;  Laterality: N/A;   RIGHT/LEFT HEART CATH AND CORONARY ANGIOGRAPHY N/A 08/13/2021   Procedure: RIGHT/LEFT HEART CATH AND CORONARY ANGIOGRAPHY;  Surgeon: Nigel Mormon, MD;  Location: [place] [facility];  Service: Cardiovascular;  Laterality: N/A;   TEE WITHOUT CARDIOVERSION N/A 11/13/2016   Procedure: TRANSESOPHAGEAL ECHOCARDIOGRAM (TEE);  Surgeon: Thayer Headings, MD;  Location: [facility] ENDOSCOPY;  Service: Cardiovascular;  Laterality: N/A;    Current Medications: Current Meds  Medication Sig   acetaminophen (TYLENOL) 500 MG tablet Take 500 mg by mouth 2 (two) times daily as needed for moderate pain.   Alirocumab (PRALUENT) 75 MG/ML SOAJ Inject 75 mg into the skin every 14 (fourteen) days.   amLODipine (NORVASC) 10 MG tablet Take 1 tablet (10 mg total) by  mouth daily.   apixaban (ELIQUIS) 5 MG TABS tablet Take 1 tablet by mouth twice daily   bisacodyl (DULCOLAX) 5 MG EC tablet Take 5 mg by mouth daily as needed.   clopidogrel (PLAVIX) 75 MG tablet Take 1 tablet (75 mg total) by mouth daily.   docusate sodium (COLACE) 100 MG capsule Take 100 mg by mouth 2 (two) times daily as needed.   fexofenadine (ALLEGRA) 180 MG tablet Take 180 mg by mouth daily as needed (seasonal allergies).   gabapentin (NEURONTIN) 300 MG capsule Take 300 mg by mouth at bedtime.   hydrALAZINE (APRESOLINE) 50 MG tablet TAKE ADDITIONAL DOSE AS NEEDED FOR BP GREATER THAN 150/90 MMHG   hydrochlorothiazide (MICROZIDE) 12.5  MG capsule Take 1 capsule (12.5 mg total) by mouth daily.   isosorbide mononitrate (IMDUR) 60 MG 24 hr tablet Take 1 tablet (60 mg total) by mouth daily.   metoprolol tartrate (LOPRESSOR) 100 MG tablet Take 1 tablet (100 mg total) by mouth 2 (two) times daily.   nicotine (NICODERM CQ) 14 mg/24hr patch Place 1 patch (14 mg total) onto the skin daily.   nicotine (NICODERM CQ) 7 mg/24hr patch Place 1 patch (7 mg total) onto the skin daily. Please start after you have used up 40 mg dosage   nitroGLYCERIN (NITROSTAT) 0.4 MG SL tablet Place 1 tablet (0.4 mg total) under the tongue every 5 (five) minutes as needed for chest pain.   pantoprazole (PROTONIX) 20 MG tablet Take 1 tablet (20 mg total) by mouth 2 (two) times daily.   sacubitril-valsartan (ENTRESTO) 97-103 MG Take 1 tablet by mouth 2 (two) times daily.   spironolactone (ALDACTONE) 25 MG tablet Take 1 tablet (25 mg total) by mouth daily.     Allergies:   Codeine, Crestor [rosuvastatin], Albuterol, Dilaudid [hydromorphone], Isosorbide, Lexapro [escitalopram], Lipitor [atorvastatin calcium], Erythromycin, and Oxycodone   Social History   Socioeconomic History   Marital status: Married    Spouse name: Not on file   Number of children: 2   Years of education: Not on file   Highest education level: Not on file  Occupational History   Not on file  Tobacco Use   Smoking status: Every Day    Packs/day: 0.25    Years: 25.00    Total pack years: 6.25    Types: Cigarettes    Start date: 1989   Smokeless tobacco: Never  Vaping Use   Vaping Use: Never used  Substance and Sexual Activity   Alcohol use: No   Drug use: No   Sexual activity: Yes  Other Topics Concern   Not on file  Social History Narrative   Pt lives in [place] with spouse and 61 year old son.   disabled   Social Determinants of Radio broadcast assistant Strain: Not on file  Food Insecurity: Not on file  Transportation Needs: Not on file  Physical Activity: Not on  file  Stress: Not on file  Social Connections: Not on file     Family History: The patient's family history includes Breast cancer in his mother; Cancer in his father; Congestive Heart Failure in his father; Diabetes in his father and mother; Heart attack in his mother; Heart attack (age of onset: 37) in his father; Heart disease (age of onset: 76) in his mother; Hypertension in his father and mother; Lung cancer in his father and paternal uncle; Tongue cancer in his mother. There is no history of Colon cancer, Esophageal cancer, Inflammatory bowel disease, Liver disease,  Pancreatic cancer, Rectal cancer, or Stomach cancer.  ROS:   Please see the history of present illness.    Under stress at home.  Recent URI this has been lingering for longer than a month.  No side effects on Praluent.  All other systems reviewed and are negative.  EKGs/Labs/Other Studies Reviewed:    The following studies were reviewed today:  Cardiac catheterization June 2023: Diagnostic Dominance: Left    EKG:  EKG performed April 07, 2022 demonstrates normal sinus rhythm with nonspecific ST abnormality.  Recent Labs: 10/03/2021: ALT 21 04/04/2022: Hemoglobin 16.6; Platelets 272 04/18/2022: BUN 14; Creatinine, Ser 0.75; Potassium 3.6; Sodium 138  Recent Lipid Panel    Component Value Date/Time   CHOL 281 (H) 09/10/2021 1456   TRIG 141 09/10/2021 1456   HDL 53 09/10/2021 1456   CHOLHDL 5.0 11/27/2017 0634   VLDL 16 11/27/2017 0634   LDLCALC 202 (H) 09/10/2021 1456    Physical Exam:    VS:  BP (!) 144/94   Pulse (!) 46   Ht '5\' 9"'$  (1.753 m)   Wt 245 lb 12.8 oz (111.5 kg)   SpO2 96%   BMI 36.30 kg/m     Wt Readings from Last 3 Encounters:  10/01/22 245 lb 12.8 oz (111.5 kg)  04/04/22 239 lb (108.4 kg)  03/25/22 239 lb 9.6 oz (108.7 kg)     GEN: Bees. No acute distress HEENT: Normal NECK: No JVD. LYMPHATICS: No lymphadenopathy CARDIAC: No murmur. RRR no gallop, or edema. VASCULAR:  Normal Pulses.  No bruits. RESPIRATORY:  Clear to auscultation without rales, wheezing or rhonchi  ABDOMEN: Soft, non-tender, non-distended, No pulsatile mass, MUSCULOSKELETAL: No deformity  SKIN: Warm and dry NEUROLOGIC:  Alert and oriented x 3 PSYCHIATRIC:  Normal affect   ASSESSMENT:    1. Chronic combined systolic and diastolic heart failure ([place])   2. Primary hypertension   3. Paroxysmal atrial fibrillation (HCC)   4. Coronary artery disease of native artery of native heart with stable angina pectoris ([place])   5. Myalgia due to statin   6. Tobacco abuse counseling   7. Nonischemic cardiomyopathy (HCC)    PLAN:    In order of problems listed above:  Prior chronic combined systolic and diastolic heart failure improved to chronic diastolic heart failure.  Continue heart failure regimen that at this point includes Aldactone, Entresto, beta-blocker therapy, and salt restriction.  Probably need to switch beta-blocker from metoprolol to tartrate to metoprolol succinate or carvedilol.  We could also add SGLT2 therapy.  Will check a hemoglobin A1c today. Has not had his medication this morning and therefore blood pressure of 145/95 mmHg is at the end of the dosing interval.  He monitors blood pressure at home and states that it runs below 140/90 mmHg.  His son endorses this statement. No recurrence of atrial fibrillation Aggressive secondary prevention including lipid lowering.  Not currently on statin therapy because he is intolerant.  He is on Praluent.  Lipid panel will be obtained today.  Angina has significantly improved with up titration of anti-ischemic therapy (primarily long-acting nitrate therapy). No longer smoking cigarettes. LV function has improved to low normal, 50%.   Overall education and awareness concerning primary/secondary risk prevention was discussed in detail: LDL less than 70, hemoglobin A1c less than 7, blood pressure target less than 130/80 mmHg, >150 minutes of moderate aerobic  activity per week, avoidance of smoking, weight control (via diet and exercise), and continued surveillance/management of/for obstructive sleep apnea.  He  is interventional cardiology follow-up.  Recommend Dr. Casandra Doffing  Medication Adjustments/Labs and Tests Ordered: Current medicines are reviewed at length with the patient today.  Concerns regarding medicines are outlined above.  Orders Placed This Encounter  Procedures   Lipid panel   Hemoglobin A1c   Comprehensive metabolic panel   Pro b natriuretic peptide (BNP)   No orders of the defined types were placed in this encounter.   Patient Instructions  Medication Instructions:  Your physician recommends that you continue on your current medications as directed. Please refer to the Current Medication list given to you today.  *If you need a refill on your cardiac medications before your next appointment, please call your pharmacy*  Lab Work: TODAY: CMET, Hgb A1c, BNP, Lipid panel If you have labs (blood work) drawn today and your tests are completely normal, you will receive your results only by: [place] (if you have MyChart) OR A paper copy in the mail If you have any lab test that is abnormal or we need to change your treatment, we will call you to review the results.  Follow-Up: At [facility], you and your health needs are our priority.  As part of our continuing mission to provide you with exceptional heart care, we have created designated Provider Care Teams.  These Care Teams include your primary Cardiologist (physician) and Advanced Practice Providers (APPs -  Physician Assistants and Nurse Practitioners) who all work together to provide you with the care you need, when you need it.  Your next appointment:   6 month(s)  The format for your next appointment:   In Person  Provider:   Larae Grooms, MD  Important Information About Sugar         Signed, Sinclair Grooms, MD  10/01/2022  10:46 AM    [place]

## 2022-10-01 ENCOUNTER — Ambulatory Visit: Payer: Medicare Other | Attending: Interventional Cardiology | Admitting: Interventional Cardiology

## 2022-10-01 ENCOUNTER — Encounter: Payer: Self-pay | Admitting: Interventional Cardiology

## 2022-10-01 VITALS — BP 144/94 | HR 46 | Ht 69.0 in | Wt 245.8 lb

## 2022-10-01 DIAGNOSIS — T466X5A Adverse effect of antihyperlipidemic and antiarteriosclerotic drugs, initial encounter: Secondary | ICD-10-CM

## 2022-10-01 DIAGNOSIS — I25118 Atherosclerotic heart disease of native coronary artery with other forms of angina pectoris: Secondary | ICD-10-CM | POA: Diagnosis not present

## 2022-10-01 DIAGNOSIS — I10 Essential (primary) hypertension: Secondary | ICD-10-CM

## 2022-10-01 DIAGNOSIS — I5042 Chronic combined systolic (congestive) and diastolic (congestive) heart failure: Secondary | ICD-10-CM

## 2022-10-01 DIAGNOSIS — R739 Hyperglycemia, unspecified: Secondary | ICD-10-CM | POA: Diagnosis not present

## 2022-10-01 DIAGNOSIS — I480 Paroxysmal atrial fibrillation: Secondary | ICD-10-CM

## 2022-10-01 DIAGNOSIS — Z716 Tobacco abuse counseling: Secondary | ICD-10-CM

## 2022-10-01 DIAGNOSIS — M791 Myalgia, unspecified site: Secondary | ICD-10-CM

## 2022-10-01 DIAGNOSIS — I428 Other cardiomyopathies: Secondary | ICD-10-CM | POA: Diagnosis not present

## 2022-10-01 NOTE — Patient Instructions (Signed)
Medication Instructions:  Your physician recommends that you continue on your current medications as directed. Please refer to the Current Medication list given to you today.  *If you need a refill on your cardiac medications before your next appointment, please call your pharmacy*  Lab Work: TODAY: CMET, Hgb A1c, BNP, Lipid panel If you have labs (blood work) drawn today and your tests are completely normal, you will receive your results only by: [place] (if you have MyChart) OR A paper copy in the mail If you have any lab test that is abnormal or we need to change your treatment, we will call you to review the results.  Follow-Up: At [facility], you and your health needs are our priority.  As part of our continuing mission to provide you with exceptional heart care, we have created designated Provider Care Teams.  These Care Teams include your primary Cardiologist (physician) and Advanced Practice Providers (APPs -  Physician Assistants and Nurse Practitioners) who all work together to provide you with the care you need, when you need it.  Your next appointment:   6 month(s)  The format for your next appointment:   In Person  Provider:   Larae Grooms, MD  Important Information About Sugar

## 2022-10-02 LAB — COMPREHENSIVE METABOLIC PANEL
ALT: 25 IU/L (ref 0–44)
AST: 16 IU/L (ref 0–40)
Albumin/Globulin Ratio: 1.2 (ref 1.2–2.2)
Albumin: 3.5 g/dL — ABNORMAL LOW (ref 3.8–4.9)
Alkaline Phosphatase: 132 IU/L — ABNORMAL HIGH (ref 44–121)
BUN/Creatinine Ratio: 23 — ABNORMAL HIGH (ref 9–20)
BUN: 18 mg/dL (ref 6–24)
Bilirubin Total: <0.2 mg/dL (ref 0.0–1.2)
CO2: 25 mmol/L (ref 20–29)
Calcium: 9.2 mg/dL (ref 8.7–10.2)
Chloride: 102 mmol/L (ref 96–106)
Creatinine, Ser: 0.78 mg/dL (ref 0.76–1.27)
Globulin, Total: 2.9 g/dL (ref 1.5–4.5)
Glucose: 154 mg/dL — ABNORMAL HIGH (ref 70–99)
Potassium: 3.7 mmol/L (ref 3.5–5.2)
Sodium: 140 mmol/L (ref 134–144)
Total Protein: 6.4 g/dL (ref 6.0–8.5)
eGFR: 106 mL/min/{1.73_m2} (ref >59)

## 2022-10-02 LAB — LIPID PANEL
Chol/HDL Ratio: 6.3 ratio — ABNORMAL HIGH (ref 0.0–5.0)
Cholesterol, Total: 265 mg/dL — ABNORMAL HIGH (ref 100–199)
HDL: 42 mg/dL (ref >39)
LDL Chol Calc (NIH): 192 mg/dL — ABNORMAL HIGH (ref 0–99)
Triglycerides: 164 mg/dL — ABNORMAL HIGH (ref 0–149)
VLDL Cholesterol Cal: 31 mg/dL (ref 5–40)

## 2022-10-02 LAB — HEMOGLOBIN A1C
Est. average glucose Bld gHb Est-mCnc: 194 mg/dL
Hgb A1c MFr Bld: 8.4 % — ABNORMAL HIGH (ref 4.8–5.6)

## 2022-10-02 LAB — PRO B NATRIURETIC PEPTIDE: NT-Pro BNP: 126 pg/mL — ABNORMAL HIGH (ref 0–121)

## 2022-10-09 ENCOUNTER — Telehealth: Payer: Self-pay | Admitting: Interventional Cardiology

## 2022-10-09 ENCOUNTER — Encounter: Payer: Self-pay | Admitting: Interventional Cardiology

## 2022-10-09 DIAGNOSIS — I10 Essential (primary) hypertension: Secondary | ICD-10-CM

## 2022-10-09 DIAGNOSIS — Z79899 Other long term (current) drug therapy: Secondary | ICD-10-CM

## 2022-10-09 DIAGNOSIS — E7800 Pure hypercholesterolemia, unspecified: Secondary | ICD-10-CM

## 2022-10-09 MED ORDER — DAPAGLIFLOZIN PROPANEDIOL 10 MG PO TABS: 10.0000 mg | ORAL_TABLET | Freq: Every day | ORAL | 11 refills | Status: DC

## 2022-10-09 NOTE — Telephone Encounter (Signed)
Left message for patient with lab results (OK per DPR). Also released via MyChart.  Requested response either by telephone or MyChart.  Referral placed to Lipid Clinic per Dr. Tamala Julian.

## 2022-10-09 NOTE — Addendum Note (Signed)
Addended by: Molli Barrows on: 10/09/2022 03:51 PM   Modules accepted: Orders

## 2022-10-31 ENCOUNTER — Other Ambulatory Visit: Payer: Medicare Other

## 2022-11-19 ENCOUNTER — Ambulatory Visit: Payer: Medicare Other | Attending: Interventional Cardiology

## 2022-11-20 ENCOUNTER — Telehealth: Payer: Self-pay

## 2022-11-20 NOTE — Patient Outreach (Signed)
  Care Coordination   Initial Visit Note   11/20/2022 Name: Danny Kelley MRN: [id number] DOB: 11/07/1965  Danny Kelley is a 57 y.o. year old male who sees [place], Angelique Blonder, MD for primary care. I spoke with  Danny Kelley by phone today.  What matters to the patients health and wellness today?  Placed call to patient to review and offer [facility] care coordination program.  Patient reports that he is doing well and denies any needs today.    SDOH assessments and interventions completed:  No     Care Coordination Interventions:  No, not indicated   Follow up plan: No further intervention required.   Encounter Outcome:  Pt. Refused   Tomasa Rand, RN, BSN, CEN [facility] [employer] [phone]

## 2022-12-09 ENCOUNTER — Encounter: Payer: Self-pay | Admitting: Interventional Cardiology

## 2022-12-10 ENCOUNTER — Telehealth: Payer: Self-pay

## 2022-12-10 NOTE — Telephone Encounter (Signed)
Patient is scheduled with APP on 2/9 at [place]. Patient voiced understanding.

## 2022-12-11 NOTE — Progress Notes (Signed)
PCP:  Angelina Sheriff, MD Primary Cardiologist: Sinclair Grooms, MD (Inactive) -> Dr. Irish Lack Electrophysiologist: Dr. Rayann Heman ->  Will Meredith Leeds, MD   Danny Kelley is a 57 y.o. male seen today for Will Meredith Leeds, MD for acute visit due to increased palpitations/fluttering.    Patient reports more palpitations and "irregular" notifications from his BP cuff at home over the past 1-2 weeks.  He states he had a long (4-6 week) viral type illness back in October, but denies any recently.  He has been very fatigued, sleeping for 10-12 hrs at a time.  He denies angina, which has been typical in the past, but has been having increased SOB with mild/mod exertion.  It has been > 3 weeks since he has taken a NTG.  Chronic cough, without acute exacerbation.   AFib Hx Diagnosed 2017, reported lifelong hx of palpitations PVI ablation Jan 2018 and April 2021   AAD Hx Sotalol stopped 2/2 syncope and prolonged QT Tikosyn failed as well with prolonged QT Multaq failed   Past Medical History:  Diagnosis Date   Arthritis    "a little in my right knee" (07/14/2016)   Atrial fibrillation (HCC)    CAD (coronary artery disease)    Chronic congestive heart failure with left ventricular diastolic dysfunction (HCC)    Complication of anesthesia    Pt reports "they have a hard time waking me up"   GERD (gastroesophageal reflux disease)    hx   Headache    "with every heart issue that I have" (07/14/2016)   High cholesterol    History of hiatal hernia 1990s   "fixed it w/scope down my throat"   Hypertension    Myocardial infarction ([place]) 05/2013   stents: left PDA, 1st OM at [facility]   Obesity    OSA (obstructive sleep apnea)    Persistent atrial fibrillation (HCC)    Pneumonia ~ 1992   QT prolongation 07/15/2016   Seasonal allergies    "I take Allegra prn" (07/14/2016)   Stroke ([place])    Tobacco abuse 12/16/2016    Current Outpatient Medications  Medication  Instructions   acetaminophen (TYLENOL) 500 mg, Oral, 2 times daily PRN   amLODipine (NORVASC) 10 mg, Oral, Daily   bisacodyl (DULCOLAX) 5 mg, Oral, Daily PRN   clopidogrel (PLAVIX) 75 mg, Oral, Daily   dapagliflozin propanediol (FARXIGA) 10 mg, Oral, Daily before breakfast   docusate sodium (COLACE) 100 mg, Oral, 2 times daily PRN   Eliquis 5 mg, Oral, 2 times daily   fexofenadine (ALLEGRA) 180 mg, Oral, Daily PRN   gabapentin (NEURONTIN) 300 mg, Oral, Nightly   hydrALAZINE (APRESOLINE) 50 MG tablet TAKE ADDITIONAL DOSE AS NEEDED FOR BP GREATER THAN 150/90 MMHG   hydrochlorothiazide (MICROZIDE) 12.5 mg, Oral, Daily   isosorbide mononitrate (IMDUR) 60 mg, Oral, Daily   metoprolol tartrate (LOPRESSOR) 100 mg, Oral, 2 times daily   nicotine (NICODERM CQ) 7 mg, Transdermal, Every 24 hours, Please start after you have used up 40 mg dosage   nicotine (NICODERM CQ) 14 mg, Transdermal, Every 24 hours   nitroGLYCERIN (NITROSTAT) 0.4 mg, Sublingual, Every 5 min PRN   pantoprazole (PROTONIX) 20 mg, Oral, 2 times daily   Praluent 75 mg, Subcutaneous, Every 14 days   sacubitril-valsartan (ENTRESTO) 97-103 MG 1 tablet, Oral, 2 times daily   spironolactone (ALDACTONE) 25 mg, Oral, Daily    Physical Exam: Vitals:   12/12/22 0845  BP: 126/70  Pulse:  74  SpO2: 96%  Weight: 248 lb (112.5 kg)  Height: 5' 9"$  (1.753 m)    GEN- NAD. A&O x 3. Normal affect. HEENT: normocephalic, atraumatic Lungs- CTAB, Normal effort Heart- Regular rate and rhythm, No M/G/R Extremities- No peripheral edema. no clubbing or cyanosis; Skin- warm and dry, no rash or lesion  EKG is ordered today. Personal review shows NSR at 74 bpm  Additional studies reviewed include: Previous EP notes.   Assessment and Plan:  1. Persistent AFib EKG today shows NSR     CHA2DS2Vasc is 5, on Eliquis, appropriately dosed     low burden by symptoms, but increased over past several weeks.  Suspect from PACs/PVCs as prior, and by  history and symptoms wonder if he may have had a COVID or similar exposure with his increased fatigue and cardiac irritability Update Echo.  Will use 7 day Zio monitor to assess arrhyhtmia burden.  His AAD options are very limited given Tikosyn/Sotalol failure and CAD. Likely limited to amiodarone, which he is very wary of re: side effect profile.  Labs today   2. NICM 3. Chronic CHF 4. CAD Most recent echo 11/2020 showed LVEF 50-55%, will update.  No typical anginal symptoms, though more SOB. If echo down, will need new ischemic eval.  Volume status stable on exam.  He is OK resuming farxiga after indications were explained to him thoroughly today.   Follow up with Dr. Curt Bears in 3 months, sooner if work up is concerning  Shirley Friar, [place]  12/12/22 9:06 AM

## 2022-12-12 ENCOUNTER — Ambulatory Visit: Payer: Medicare Other | Attending: Student | Admitting: Student

## 2022-12-12 ENCOUNTER — Encounter: Payer: Self-pay | Admitting: Student

## 2022-12-12 ENCOUNTER — Ambulatory Visit: Payer: Medicare Other | Attending: Student

## 2022-12-12 VITALS — BP 126/70 | HR 74 | Ht 69.0 in | Wt 248.0 lb

## 2022-12-12 DIAGNOSIS — I480 Paroxysmal atrial fibrillation: Secondary | ICD-10-CM | POA: Diagnosis not present

## 2022-12-12 DIAGNOSIS — I25118 Atherosclerotic heart disease of native coronary artery with other forms of angina pectoris: Secondary | ICD-10-CM

## 2022-12-12 DIAGNOSIS — I5042 Chronic combined systolic (congestive) and diastolic (congestive) heart failure: Secondary | ICD-10-CM | POA: Diagnosis not present

## 2022-12-12 DIAGNOSIS — I10 Essential (primary) hypertension: Secondary | ICD-10-CM | POA: Diagnosis not present

## 2022-12-12 MED ORDER — DAPAGLIFLOZIN PROPANEDIOL 10 MG PO TABS: 10.0000 mg | ORAL_TABLET | Freq: Every day | ORAL | 11 refills | Status: DC

## 2022-12-12 NOTE — Progress Notes (Unsigned)
Enrolled patient for a 7 day Zio XT monitor to be mailed to patients home  [place] to read

## 2022-12-12 NOTE — Patient Instructions (Signed)
Medication Instructions:  Your physician recommends that you continue on your current medications as directed. Please refer to the Current Medication list given to you today.  *If you need a refill on your cardiac medications before your next appointment, please call your pharmacy*  Lab Work: TODAY: BMET, CBC If you have labs (blood work) drawn today and your tests are completely normal, you will receive your results only by: [place] (if you have MyChart) OR A paper copy in the mail If you have any lab test that is abnormal or we need to change your treatment, we will call you to review the results.  Testing/Procedures: Your physician has requested that you have an echocardiogram. Echocardiography is a painless test that uses sound waves to create images of your heart. It provides your doctor with information about the size and shape of your heart and how well your heart's chambers and valves are working. This procedure takes approximately one hour. There are no restrictions for this procedure. Please do NOT wear cologne, perfume, aftershave, or lotions (deodorant is allowed). Please arrive 15 minutes prior to your appointment time.  Your physician has requested that you wear a Zio heart monitor for 7 days. This will be mailed to your home with instructions on how to apply the monitor and how to return it when finished. Please allow 2 weeks after returning the heart monitor before our office calls you with the results.   Follow-Up: At [facility], you and your health needs are our priority.  As part of our continuing mission to provide you with exceptional heart care, we have created designated Provider Care Teams.  These Care Teams include your primary Cardiologist (physician) and Advanced Practice Providers (APPs -  Physician Assistants and Nurse Practitioners) who all work together to provide you with the care you need, when you need it.  Your next appointment:   3  month(s)  Provider:   Allegra Lai, MD  Other Instructions [facility]- [facility] Term Monitor Instructions     Your physician has requested you wear a ZIO patch monitor for 7 days.  This is a single patch monitor. [employer] supplies one patch monitor per enrollment. Additional  stickers are not available. Please do not apply patch if you will be having a Nuclear Stress Test,  Echocardiogram, Cardiac CT, MRI, or Chest Xray during the period you would be wearing the  monitor. The patch cannot be worn during these tests. You cannot remove and re-apply the  ZIO XT patch monitor.  Your ZIO patch monitor will be mailed 3 day USPS to your address on file. It may take 3-5 days  to receive your monitor after you have been enrolled.  Once you have received your monitor, please review the enclosed instructions. Your monitor  has already been registered assigning a specific monitor serial # to you.     Billing and Patient Assistance Program Information     We have supplied [employer] with any of your insurance information on file for billing purposes.  [employer] offers a sliding scale Patient Assistance Program for patients that do not have  insurance, or whose insurance does not completely cover the cost of the ZIO monitor.  You must apply for the Patient Assistance Program to qualify for this discounted rate.  To apply, please call [employer] at [phone], select option 4, select option 2, ask to apply for  Patient Assistance Program. Theodore Demark will ask your household income, and how many people  are in your  household. They will quote your out-of-pocket cost based on that information.  [employer] will also be able to set up a 70-month interest-free payment plan if needed.     Applying the monitor     Shave hair from upper left chest.  Hold abrader disc by orange tab. Rub abrader in 40 strokes over the upper left chest as  indicated in your monitor instructions.  Clean area with 4 enclosed alcohol pads. Let dry.   Apply patch as indicated in monitor instructions. Patch will be placed under collarbone on left  side of chest with arrow pointing upward.  Rub patch adhesive wings for 2 minutes. Remove white label marked "1". Remove the white  label marked "2". Rub patch adhesive wings for 2 additional minutes.  While looking in a mirror, press and release button in center of patch. A small green light will  flash 3-4 times. This will be your only indicator that the monitor has been turned on.  Do not shower for the first 24 hours. You may shower after the first 24 hours.  Press the button if you feel a symptom. You will hear a small click. Record Date, Time and  Symptom in the Patient Logbook.  When you are ready to remove the patch, follow instructions on the last 2 pages of Patient  Logbook. Stick patch monitor onto the last page of Patient Logbook.  Place Patient Logbook in the blue and white box. Use locking tab on box and tape box closed  securely. The blue and white box has prepaid postage on it. Please place it in the mailbox as  soon as possible. Your physician should have your test results approximately 7 days after the  monitor has [facility]  Call [facility] [phone] you have questions regarding  your ZIO XT patch monitor. Call them immediately if you see an orange light blinking on your  monitor.  If your monitor falls off in less than 4 days, contact our Monitor department at [phone]  If your monitor becomes loose or falls off after 4 days call [employer] at [phone]  suggestions on securing your monitor.

## 2022-12-14 LAB — CBC
Hematocrit: 46.7 % (ref 37.5–51.0)
Hemoglobin: 15.7 g/dL (ref 13.0–17.7)
MCH: 28.5 pg (ref 26.6–33.0)
MCHC: 33.6 g/dL (ref 31.5–35.7)
MCV: 85 fL (ref 79–97)
Platelets: 291 10*3/uL (ref 150–450)
RBC: 5.51 x10E6/uL (ref 4.14–5.80)
RDW: 13.6 % (ref 11.6–15.4)
WBC: 10.7 10*3/uL (ref 3.4–10.8)

## 2022-12-14 LAB — BASIC METABOLIC PANEL
BUN/Creatinine Ratio: 25 — ABNORMAL HIGH (ref 9–20)
BUN: 18 mg/dL (ref 6–24)
CO2: 25 mmol/L (ref 20–29)
Calcium: 9.6 mg/dL (ref 8.7–10.2)
Chloride: 100 mmol/L (ref 96–106)
Creatinine, Ser: 0.73 mg/dL — ABNORMAL LOW (ref 0.76–1.27)
Glucose: 194 mg/dL — ABNORMAL HIGH (ref 70–99)
Potassium: 4 mmol/L (ref 3.5–5.2)
Sodium: 140 mmol/L (ref 134–144)
eGFR: 108 mL/min/{1.73_m2} (ref >59)

## 2022-12-14 LAB — PRO B NATRIURETIC PEPTIDE: NT-Pro BNP: <36 pg/mL (ref 0–121)

## 2022-12-17 DIAGNOSIS — I480 Paroxysmal atrial fibrillation: Secondary | ICD-10-CM | POA: Diagnosis not present

## 2022-12-22 DIAGNOSIS — I509 Heart failure, unspecified: Secondary | ICD-10-CM | POA: Diagnosis not present

## 2022-12-22 DIAGNOSIS — E1165 Type 2 diabetes mellitus with hyperglycemia: Secondary | ICD-10-CM | POA: Diagnosis not present

## 2022-12-22 DIAGNOSIS — I2510 Atherosclerotic heart disease of native coronary artery without angina pectoris: Secondary | ICD-10-CM | POA: Diagnosis not present

## 2022-12-22 DIAGNOSIS — R519 Headache, unspecified: Secondary | ICD-10-CM | POA: Diagnosis not present

## 2022-12-22 DIAGNOSIS — I4891 Unspecified atrial fibrillation: Secondary | ICD-10-CM | POA: Diagnosis not present

## 2022-12-22 DIAGNOSIS — Z91199 Patient's noncompliance with other medical treatment and regimen due to unspecified reason: Secondary | ICD-10-CM | POA: Diagnosis not present

## 2022-12-30 DIAGNOSIS — E1165 Type 2 diabetes mellitus with hyperglycemia: Secondary | ICD-10-CM | POA: Diagnosis not present

## 2023-01-06 ENCOUNTER — Ambulatory Visit (HOSPITAL_COMMUNITY): Payer: Medicare Other | Attending: Student

## 2023-01-06 DIAGNOSIS — I5042 Chronic combined systolic (congestive) and diastolic (congestive) heart failure: Secondary | ICD-10-CM | POA: Diagnosis not present

## 2023-01-06 DIAGNOSIS — I480 Paroxysmal atrial fibrillation: Secondary | ICD-10-CM | POA: Insufficient documentation

## 2023-01-06 LAB — ECHOCARDIOGRAM COMPLETE
Area-P 1/2: 3.36 cm2
S' Lateral: 3.6 cm

## 2023-01-09 DIAGNOSIS — I480 Paroxysmal atrial fibrillation: Secondary | ICD-10-CM | POA: Diagnosis not present

## 2023-01-13 ENCOUNTER — Other Ambulatory Visit: Payer: Self-pay | Admitting: *Deleted

## 2023-01-13 MED ORDER — MEXILETINE HCL 150 MG PO CAPS: 150.0000 mg | ORAL_CAPSULE | Freq: Two times a day (BID) | ORAL | 3 refills | Status: DC

## 2023-01-21 ENCOUNTER — Encounter: Payer: Self-pay | Admitting: Cardiology

## 2023-01-25 ENCOUNTER — Encounter: Payer: Self-pay | Admitting: Cardiology

## 2023-01-29 ENCOUNTER — Other Ambulatory Visit: Payer: Self-pay | Admitting: *Deleted

## 2023-01-29 MED ORDER — ENTRESTO 97-103 MG PO TABS: 1.0000 | ORAL_TABLET | Freq: Two times a day (BID) | ORAL | 8 refills | Status: DC

## 2023-02-19 DIAGNOSIS — I10 Essential (primary) hypertension: Secondary | ICD-10-CM | POA: Diagnosis not present

## 2023-02-19 DIAGNOSIS — G4700 Insomnia, unspecified: Secondary | ICD-10-CM | POA: Diagnosis not present

## 2023-02-19 DIAGNOSIS — E7800 Pure hypercholesterolemia, unspecified: Secondary | ICD-10-CM | POA: Diagnosis not present

## 2023-02-19 DIAGNOSIS — E1165 Type 2 diabetes mellitus with hyperglycemia: Secondary | ICD-10-CM | POA: Diagnosis not present

## 2023-02-19 LAB — LAB REPORT - SCANNED
A1c: 6.7
EGFR: 60

## 2023-03-25 ENCOUNTER — Other Ambulatory Visit: Payer: Self-pay

## 2023-03-25 DIAGNOSIS — I25118 Atherosclerotic heart disease of native coronary artery with other forms of angina pectoris: Secondary | ICD-10-CM

## 2023-03-25 MED ORDER — PANTOPRAZOLE SODIUM 20 MG PO TBEC: 20.0000 mg | DELAYED_RELEASE_TABLET | Freq: Two times a day (BID) | ORAL | 0 refills | Status: DC

## 2023-03-25 MED ORDER — CLOPIDOGREL BISULFATE 75 MG PO TABS: 75.0000 mg | ORAL_TABLET | Freq: Every day | ORAL | 0 refills | Status: DC

## 2023-03-25 NOTE — Addendum Note (Signed)
Addended by: Margaret Pyle D on: 03/25/2023 10:33 AM   Modules accepted: Orders

## 2023-04-01 ENCOUNTER — Encounter: Payer: Self-pay | Admitting: Cardiology

## 2023-04-01 ENCOUNTER — Ambulatory Visit: Payer: Medicare Other | Attending: Cardiology | Admitting: Cardiology

## 2023-04-01 VITALS — BP 124/82 | HR 67 | Wt 228.8 lb

## 2023-04-01 DIAGNOSIS — I4819 Other persistent atrial fibrillation: Secondary | ICD-10-CM

## 2023-04-01 DIAGNOSIS — D6869 Other thrombophilia: Secondary | ICD-10-CM | POA: Diagnosis not present

## 2023-04-01 DIAGNOSIS — I5022 Chronic systolic (congestive) heart failure: Secondary | ICD-10-CM | POA: Diagnosis not present

## 2023-04-01 DIAGNOSIS — I2510 Atherosclerotic heart disease of native coronary artery without angina pectoris: Secondary | ICD-10-CM

## 2023-04-01 DIAGNOSIS — I493 Ventricular premature depolarization: Secondary | ICD-10-CM

## 2023-04-01 NOTE — Progress Notes (Signed)
Electrophysiology Office Note   Date:  04/01/2023   ID:  Danny Kelley, DOB 06-26-1966, MRN [id number]  PCP:  Noni Saupe, MD  Cardiologist:  Eldridge Dace Primary Electrophysiologist:  Danel Requena Jorja Loa, MD    Chief Complaint: PVC, AF   History of Present Illness: Danny Kelley is a 57 y.o. male who is being seen today for the evaluation of PVC, AF at the request of Noni Saupe, MD. Presenting today for electrophysiology evaluation.  He has a history significant for coronary artery disease, atrial fibrillation, diastolic heart failure, hyperlipidemia, hypertension, OSA, CVA.  He has been on multiple antiarrhythmics including sotalol, dofetilide, Multaq.  Complete prolonged with class III antiarrhythmics.  He was on Multaq which failed to control his arrhythmia.  Today, he denies symptoms of palpitations, chest pain, shortness of breath, orthopnea, PND, lower extremity edema, claudication, dizziness, presyncope, syncope, bleeding, or neurologic sequela. The patient is tolerating medications without difficulties.  He was found to have an elevated PVC burden at 15%.  His cardiac monitor did not show any episodes of atrial fibrillation.  He was started on mexiletine but had significant side effects.  Mexiletine was stopped.  Since stopping mexiletine, his palpitations have improved.   Past Medical History:  Diagnosis Date   Arthritis    "a little in my right knee" (07/14/2016)   Atrial fibrillation (HCC)    CAD (coronary artery disease)    Chronic congestive heart failure with left ventricular diastolic dysfunction (HCC)    Complication of anesthesia    Pt reports "they have a hard time waking me up"   GERD (gastroesophageal reflux disease)    hx   Headache    "with every heart issue that I have" (07/14/2016)   High cholesterol    History of hiatal hernia 1990s   "fixed it w/scope down my throat"   Hypertension    Myocardial infarction (HCC) 05/2013   stents: left  PDA, 1st OM at [facility]   Obesity    OSA (obstructive sleep apnea)    Persistent atrial fibrillation (HCC)    Pneumonia ~ 1992   QT prolongation 07/15/2016   Seasonal allergies    "I take Allegra prn" (07/14/2016)   Stroke (HCC)    Tobacco abuse 12/16/2016   Past Surgical History:  Procedure Laterality Date   APPENDECTOMY  1984   ATRIAL FIBRILLATION ABLATION N/A 03/01/2020   Procedure: ATRIAL FIBRILLATION ABLATION;  Surgeon: Hillis Range, MD;  [facility];  Service: Cardiovascular;  Laterality: N/A;   CARDIOVERSION N/A 05/27/2016   Procedure: CARDIOVERSION;  Surgeon: Yates Decamp, MD;  Location: [facility] ENDOSCOPY;  Service: Cardiovascular;  Laterality: N/A;   CARDIOVERSION N/A 06/11/2016   Procedure: CARDIOVERSION;  Surgeon: Yates Decamp, MD;  Location: [facility] ENDOSCOPY;  Service: Cardiovascular;  Laterality: N/A;   CARDIOVERSION N/A 03/21/2020   Procedure: CARDIOVERSION;  Surgeon: Sande Rives, MD;  Location: [facility] ENDOSCOPY;  Service: Cardiovascular;  Laterality: N/A;   CORONARY ANGIOPLASTY WITH STENT PLACEMENT  05/30/2013   "2 stents put in at [facility]" & /stent card   CORONARY IMAGING/OCT N/A 09/17/2021   Procedure: INTRAVASCULAR IMAGING/OCT;  Surgeon: Elder Negus, MD;  [facility]: [facility];  Service: Cardiovascular;  Laterality: N/A;   CORONARY PRESSURE/FFR STUDY N/A 09/21/2018   Procedure: INTRAVASCULAR PRESSURE WIRE/FFR STUDY;  Surgeon: Yates Decamp, MD;  [facility]: [facility];  Service: Cardiovascular;  Laterality: N/A;   CORONARY PRESSURE/FFR STUDY N/A 03/21/2019  Procedure: INTRAVASCULAR PRESSURE WIRE/FFR STUDY;  Surgeon: Yates Decamp, MD;  [facility]: [facility];  Service: Cardiovascular;  Laterality: N/A;   CORONARY PRESSURE/FFR STUDY N/A 01/24/2020   Procedure: INTRAVASCULAR PRESSURE WIRE/FFR STUDY;  Surgeon: Yates Decamp, MD;  [facility];  Service: Cardiovascular;  Laterality: N/A;   CORONARY PRESSURE/FFR STUDY N/A  04/04/2022   Procedure: INTRAVASCULAR PRESSURE WIRE/FFR STUDY;  Surgeon: Lyn Records, MD;  [facility];  Service: Cardiovascular;  Laterality: N/A;   CORONARY STENT INTERVENTION N/A 11/27/2017   Procedure: CORONARY STENT INTERVENTION;  Surgeon: Yates Decamp, MD;  [facility];  Service: Cardiovascular;  Laterality: N/A;   CORONARY STENT INTERVENTION N/A 03/21/2019   Procedure: CORONARY STENT INTERVENTION;  Surgeon: Yates Decamp, MD;  [facility];  Service: Cardiovascular;  Laterality: N/A;   CORONARY STENT INTERVENTION N/A 08/13/2021   Procedure: CORONARY STENT INTERVENTION;  Surgeon: Elder Negus, MD;  [facility];  Service: Cardiovascular;  Laterality: N/A;   CORONARY STENT INTERVENTION N/A 09/17/2021   Procedure: CORONARY STENT INTERVENTION;  Surgeon: Elder Negus, MD;  [facility];  Service: Cardiovascular;  Laterality: N/A;   CORONARY ULTRASOUND/IVUS N/A 08/13/2021   Procedure: Intravascular Ultrasound/IVUS;  Surgeon: Elder Negus, MD;  [facility];  Service: Cardiovascular;  Laterality: N/A;   ELECTROPHYSIOLOGIC STUDY N/A 11/13/2016   Procedure: Atrial Fibrillation Ablation;  Surgeon: Hillis Range, MD;  Location: [facility] INVASIVE CV LAB;  Service: Cardiovascular;  Laterality: N/A;   ESOPHAGOGASTRODUODENOSCOPY (EGD) WITH ESOPHAGEAL DILATION  1990s X 2   KNEE ARTHROSCOPY Right 1986; ~ 1995 X 2   LAPAROSCOPIC CHOLECYSTECTOMY  2015   LEFT HEART CATH AND CORONARY ANGIOGRAPHY N/A 11/27/2017   Procedure: LEFT HEART CATH AND CORONARY ANGIOGRAPHY;  Surgeon: Yates Decamp, MD;  [facility];  Service: Cardiovascular;  Laterality: N/A;   LEFT HEART CATH AND CORONARY ANGIOGRAPHY N/A 09/21/2018   Procedure: LEFT HEART CATH AND CORONARY ANGIOGRAPHY;  Surgeon: Yates Decamp, MD;  [facility];  Service: Cardiovascular;  Laterality: N/A;   LEFT HEART CATH AND CORONARY  ANGIOGRAPHY N/A 03/21/2019   Procedure: LEFT HEART CATH AND CORONARY ANGIOGRAPHY;  Surgeon: Yates Decamp, MD;  [facility];  Service: Cardiovascular;  Laterality: N/A;   LEFT HEART CATH AND CORONARY ANGIOGRAPHY N/A 04/04/2022   Procedure: LEFT HEART CATH AND CORONARY ANGIOGRAPHY;  Surgeon: Lyn Records, MD;  [facility];  Service: Cardiovascular;  Laterality: N/A;   REPAIR OF ESOPHAGUS  1998   perforation of the distal esophagus from bad heartburn   RIGHT/LEFT HEART CATH AND CORONARY ANGIOGRAPHY N/A 01/24/2020   Procedure: RIGHT/LEFT HEART CATH AND CORONARY ANGIOGRAPHY;  Surgeon: Yates Decamp, MD;  [facility];  Service: Cardiovascular;  Laterality: N/A;   RIGHT/LEFT HEART CATH AND CORONARY ANGIOGRAPHY N/A 08/13/2021   Procedure: RIGHT/LEFT HEART CATH AND CORONARY ANGIOGRAPHY;  Surgeon: Elder Negus, MD;  [facility];  Service: Cardiovascular;  Laterality: N/A;   TEE WITHOUT CARDIOVERSION N/A 11/13/2016   Procedure: TRANSESOPHAGEAL ECHOCARDIOGRAM (TEE);  Surgeon: Vesta Mixer, MD;  Location: [facility] ENDOSCOPY;  Service: Cardiovascular;  Laterality: N/A;     Current Outpatient Medications  Medication Sig Dispense Refill   acetaminophen (TYLENOL) 500 MG tablet Take 500 mg by mouth 2 (two) times daily as needed for moderate pain.     Alirocumab (PRALUENT) 75 MG/ML SOAJ  Inject 75 mg into the skin every 14 (fourteen) days. 2 mL 11   amLODipine (NORVASC) 10 MG tablet Take 1 tablet (10 mg total) by mouth daily. 90 tablet 3   apixaban (ELIQUIS) 5 MG TABS tablet Take 1 tablet by mouth twice daily 180 tablet 1   bisacodyl (DULCOLAX) 5 MG EC tablet Take 5 mg by mouth daily as needed.     clopidogrel (PLAVIX) 75 MG tablet Take 1 tablet (75 mg total) by mouth daily. 90 tablet 0   docusate sodium (COLACE) 100 MG capsule Take 100 mg by mouth 2 (two) times daily as needed.     fexofenadine (ALLEGRA) 180 MG tablet Take 180 mg by mouth daily as  needed (seasonal allergies).     gabapentin (NEURONTIN) 300 MG capsule Take 300 mg by mouth at bedtime.     hydrALAZINE (APRESOLINE) 50 MG tablet TAKE ADDITIONAL DOSE AS NEEDED FOR BP GREATER THAN 150/90 MMHG 90 tablet 3   hydrochlorothiazide (MICROZIDE) 12.5 MG capsule Take 1 capsule (12.5 mg total) by mouth daily. 90 capsule 3   isosorbide mononitrate (IMDUR) 60 MG 24 hr tablet Take 1 tablet (60 mg total) by mouth daily. 90 tablet 3   metoprolol tartrate (LOPRESSOR) 100 MG tablet Take 1 tablet (100 mg total) by mouth 2 (two) times daily. 180 tablet 3   nitroGLYCERIN (NITROSTAT) 0.4 MG SL tablet Place 1 tablet (0.4 mg total) under the tongue every 5 (five) minutes as needed for chest pain. 25 tablet 11   pantoprazole (PROTONIX) 20 MG tablet Take 1 tablet (20 mg total) by mouth 2 (two) times daily. 180 tablet 0   sacubitril-valsartan (ENTRESTO) 97-103 MG Take 1 tablet by mouth 2 (two) times daily. 60 tablet 8   Semaglutide,0.25 or 0.5MG /DOS, (OZEMPIC, 0.25 OR 0.5 MG/DOSE,) 2 MG/1.5ML SOPN 0.25mg  weekly     spironolactone (ALDACTONE) 25 MG tablet Take 1 tablet (25 mg total) by mouth daily. 90 tablet 3   No current facility-administered medications for this visit.    Allergies:   Codeine, Crestor [rosuvastatin], Albuterol, Dilaudid [hydromorphone], Isosorbide, Lexapro [escitalopram], Lipitor [atorvastatin calcium], Erythromycin, and Oxycodone   Social History:  The patient  reports that he has been smoking cigarettes. He started smoking about 35 years ago. He has a 6.25 pack-year smoking history. He has never used smokeless tobacco. He reports that he does not drink alcohol and does not use drugs.   Family History:  The patient's family history includes Breast cancer in his mother; Cancer in his father; Congestive Heart Failure in his father; Diabetes in his father and mother; Heart attack in his mother; Heart attack (age of onset: 55) in his father; Heart disease (age of onset: 6) in his mother;  Hypertension in his father and mother; Lung cancer in his father and paternal uncle; Tongue cancer in his mother.    ROS:  Please see the history of present illness.   Otherwise, review of systems is positive for none.   All other systems are reviewed and negative.    PHYSICAL EXAM: VS:  BP 124/82   Pulse 67   Wt 228 lb 12.8 oz (103.8 kg)   SpO2 97%   BMI 33.79 kg/m  , BMI Body mass index is 33.79 kg/m. GEN: Well nourished, well developed, in no acute distress  HEENT: normal  Neck: no JVD, carotid bruits, or masses Cardiac: RRR; no murmurs, rubs, or gallops,no edema  Respiratory:  clear to auscultation bilaterally, normal work of breathing GI: soft, nontender, nondistended, +  BS MS: no deformity or atrophy  Skin: warm and dry Neuro:  Strength and sensation are intact Psych: euthymic mood, full affect  EKG:  EKG is ordered today. Personal review of the ekg ordered shows sinus rhythm, PVC  Recent Labs: 10/01/2022: ALT 25 12/12/2022: BUN 18; Creatinine, Ser 0.73; Hemoglobin 15.7; NT-Pro BNP <36; Platelets 291; Potassium 4.0; Sodium 140    Lipid Panel     Component Value Date/Time   CHOL 265 (H) 10/01/2022 1050   TRIG 164 (H) 10/01/2022 1050   HDL 42 10/01/2022 1050   CHOLHDL 6.3 (H) 10/01/2022 1050   CHOLHDL 5.0 11/27/2017 0634   VLDL 16 11/27/2017 0634   LDLCALC 192 (H) 10/01/2022 1050     Wt Readings from Last 3 Encounters:  04/01/23 228 lb 12.8 oz (103.8 kg)  12/12/22 248 lb (112.5 kg)  10/01/22 245 lb 12.8 oz (111.5 kg)      Other studies Reviewed: Additional studies/ records that were reviewed today include: TTE 01/06/23  Review of the above records today demonstrates:   1. Left ventricular ejection fraction, by estimation, is 55 to 60%. The  left ventricle has normal function. The left ventricle has no regional  wall motion abnormalities. Left ventricular diastolic parameters are  consistent with Grade I diastolic  dysfunction (impaired relaxation).   2.  Right ventricular systolic function is normal. The right ventricular  size is normal.   3. Left atrial size was moderately dilated.   4. The mitral valve is normal in structure. No evidence of mitral valve  regurgitation.   5. The aortic valve is tricuspid. There is mild thickening of the aortic  valve. Aortic valve regurgitation is not visualized. Aortic valve  sclerosis is present, with no evidence of aortic valve stenosis.   6. Aortic dilatation noted. There is borderline dilatation of the aortic  root, measuring 39 mm. There is mild dilatation of the ascending aorta,  measuring 41 mm.   Cardiac monitor 01/12/2023 personally reviewed Predominant rhythm was sinus rhythm 15% ventricular ectopy Less than 1% supraventricular ectopy Triggered episodes associated with sinus rhythm and PVCs  LHC 01/02/22 Widely patent left main LAD wraps around the apex.  Previously placed proximal to mid LAD stent is widely patent.  Eccentric 50% stenosis proximal to the stent was interrogated with RFR and was none significant (0.97). 2 diagonals are jailed.  The large second diagonal contains mid body 70 to 80% stenosis.  Previously 99% stenosis prior to PTCA in November 2022.  There is some restenosis.  We did not attempt to redilate the diagonal. Circumflex is dominant.  It is widely patent.  A stent in the second marginal is widely patent.  A stent in the circumflex PDA is widely patent a stent of the mid to distal circumflex is widely patent. The nondominant right coronary contains overlapping stents that are widely patent. Overall LV function is normal.  EF estimated to be 50 to 55%.  LVEDP is normal.  ASSESSMENT AND PLAN:  1.  Persistent atrial fibrillation: Currently on Eliquis.  CHA2DS2-VASc of 5.  Recent cardiac monitor with an elevated PVC burden but no episodes of atrial fibrillation.  He is currently feeling well.  Bresha Hosack continue with current management.  2.  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  Plan per primary cardiology.  3.  Coronary artery disease: Currently on long-acting antianginals per primary cardiology.  4.  Second hypercoagulable state: Currently on Eliquis for atrial fibrillation  5.  PVCs: Elevated burden.  He fortunately has a normal ejection fraction.  He was started on mexiletine but had significant side effects and thus mexiletine was stopped.  He states that his palpitations have improved.  Mohini Heathcock continue with current management.  Thao Bauza repeat his echo in 1 year to ensure his ejection fraction has not changed.  Current medicines are reviewed at length with the patient today.   The patient does not have concerns regarding his medicines.  The following changes were made today:  none  Labs/ tests ordered today include:  Orders Placed This Encounter  Procedures   EKG 12-Lead   ECHOCARDIOGRAM COMPLETE     Disposition:   FU 6 months  Signed, Dania Marsan Jorja Loa, MD  04/01/2023 10:17 AM     [facility] [address] [place] [address] [phone] (office) [phone] (fax)

## 2023-04-01 NOTE — Patient Instructions (Addendum)
Medication Instructions:  Your physician recommends that you continue on your current medications as directed. Please refer to the Current Medication list given to you today.  *If you need a refill on your cardiac medications before your next appointment, please call your pharmacy*   Lab Work: None ordered   Testing/Procedures: Your physician has requested that you have an echocardiogram in one year. Echocardiography is a painless test that uses sound waves to create images of your heart. It provides your doctor with information about the size and shape of your heart and how well your heart's chambers and valves are working. This procedure takes approximately one hour. There are no restrictions for this procedure. Please do NOT wear cologne, perfume, aftershave, or lotions (deodorant is allowed). Please arrive 15 minutes prior to your appointment time.   Follow-Up: At [facility], you and your health needs are our priority.  As part of our continuing mission to provide you with exceptional heart care, we have created designated Provider Care Teams.  These Care Teams include your primary Cardiologist (physician) and Advanced Practice Providers (APPs -  Physician Assistants and Nurse Practitioners) who all work together to provide you with the care you need, when you need it.  Your next appointment:   6 month(s)  The format for your next appointment:   In Person  Provider:   Casimiro Needle "Mardelle Matte" Lanna Poche, PA-C    Thank you for choosing [facility]!!   Dory Horn, RN [phone]

## 2023-04-06 ENCOUNTER — Other Ambulatory Visit: Payer: Self-pay | Admitting: Pharmacist

## 2023-04-06 ENCOUNTER — Encounter: Payer: Self-pay | Admitting: Interventional Cardiology

## 2023-04-06 ENCOUNTER — Ambulatory Visit: Payer: Medicare Other | Attending: Interventional Cardiology | Admitting: Interventional Cardiology

## 2023-04-06 VITALS — BP 116/74 | HR 72 | Ht 69.5 in | Wt 224.0 lb

## 2023-04-06 DIAGNOSIS — I25118 Atherosclerotic heart disease of native coronary artery with other forms of angina pectoris: Secondary | ICD-10-CM | POA: Diagnosis not present

## 2023-04-06 DIAGNOSIS — I480 Paroxysmal atrial fibrillation: Secondary | ICD-10-CM

## 2023-04-06 DIAGNOSIS — I5032 Chronic diastolic (congestive) heart failure: Secondary | ICD-10-CM | POA: Diagnosis not present

## 2023-04-06 DIAGNOSIS — I493 Ventricular premature depolarization: Secondary | ICD-10-CM | POA: Diagnosis not present

## 2023-04-06 DIAGNOSIS — Z720 Tobacco use: Secondary | ICD-10-CM

## 2023-04-06 DIAGNOSIS — Z789 Other specified health status: Secondary | ICD-10-CM | POA: Diagnosis not present

## 2023-04-06 DIAGNOSIS — E7800 Pure hypercholesterolemia, unspecified: Secondary | ICD-10-CM

## 2023-04-06 MED ORDER — PRALUENT 75 MG/ML ~~LOC~~ SOAJ
75.0000 mg | SUBCUTANEOUS | 1 refills | Status: DC
Start: 2023-04-06 — End: 2023-09-07

## 2023-04-06 NOTE — Patient Instructions (Signed)
Medication Instructions:  Your physician recommends that you continue on your current medications as directed. Please refer to the Current Medication list given to you today.  *If you need a refill on your cardiac medications before your next appointment, please call your pharmacy*   Lab Work: none If you have labs (blood work) drawn today and your tests are completely normal, you will receive your results only by: MyChart Message (if you have MyChart) OR A paper copy in the mail If you have any lab test that is abnormal or we need to change your treatment, we will call you to review the results.   Testing/Procedures: none   Follow-Up: At [facility], you and your health needs are our priority.  As part of our continuing mission to provide you with exceptional heart care, we have created designated Provider Care Teams.  These Care Teams include your primary Cardiologist (physician) and Advanced Practice Providers (APPs -  Physician Assistants and Nurse Practitioners) who all work together to provide you with the care you need, when you need it.  We recommend signing up for the patient portal called "MyChart".  Sign up information is provided on this After Visit Summary.  MyChart is used to connect with patients for Virtual Visits (Telemedicine).  Patients are able to view lab/test results, encounter notes, upcoming appointments, etc.  Non-urgent messages can be sent to your provider as well.   To learn more about what you can do with MyChart, go to [email or web address].    Your next appointment:   December 3 at 10:00  Provider:   Lance Muss, MD     Other Instructions

## 2023-04-06 NOTE — Telephone Encounter (Signed)
Rx refill request received over Onbase

## 2023-04-06 NOTE — Progress Notes (Signed)
Cardiology Office Note   Date:  04/06/2023   ID:  Danny Kelley, DOB 03/30/1966, MRN [id number]  PCP:  Noni Saupe, MD    No chief complaint on file.  CAD  Wt Readings from Last 3 Encounters:  04/06/23 224 lb (101.6 kg)  04/01/23 228 lb 12.8 oz (103.8 kg)  12/12/22 248 lb (112.5 kg)       History of Present Illness: Danny Kelley is a 57 y.o. male former patient of Dr. Katrinka Blazing with a hx of chronic systolic heart failure => transitioned to chronic diastolic heart failure, hypertension, hyperlipidemia (statin intolerant), obstructive sleep apnea compliant with CPAP, tobacco use.  Patient has history of TIA in Feb 2018 while on anticoagulants, paroxysmal atrial fibrillation status post ablation 11/13/2016 and 03/01/2020 (Multaq and sotalol ineffective). Circumflex stents 2014 and 2019. Last cath 09/2021 where the LAD and right coronary was stented and the diagonal was angioplastied.    Unable to use statin therapy due to myalgias.  Also sees Dr. Elberta Fortis for PAF: "Currently on Eliquis.  CHA2DS2-VASc of 5.  Recent cardiac monitor with an elevated PVC burden but no episodes of atrial fibrillation. "  LHC 01/02/22 Widely patent left main LAD wraps around the apex.  Previously placed proximal to mid LAD stent is widely patent.  Eccentric 50% stenosis proximal to the stent was interrogated with RFR and was none significant (0.97). 2 diagonals are jailed.  The large second diagonal contains mid body 70 to 80% stenosis.  Previously 99% stenosis prior to PTCA in November 2022.  There is some restenosis.  We did not attempt to redilate the diagonal. Circumflex is dominant.  It is widely patent.  A stent in the second marginal is widely patent.  A stent in the circumflex PDA is widely patent a stent of the mid to distal circumflex is widely patent. The nondominant right coronary contains overlapping stents that are widely patent. Overall LV function is normal.  EF estimated to be 50 to 55%.   LVEDP is normal.   Had an episode of chest pain last week, first episode ina few months.  Resolved with NTG.    Walks 15-20 minutes at a time and can have some chest pressure.      Past Medical History:  Diagnosis Date   Arthritis    "a little in my right knee" (07/14/2016)   Atrial fibrillation (HCC)    CAD (coronary artery disease)    Chronic congestive heart failure with left ventricular diastolic dysfunction (HCC)    Complication of anesthesia    Pt reports "they have a hard time waking me up"   GERD (gastroesophageal reflux disease)    hx   Headache    "with every heart issue that I have" (07/14/2016)   High cholesterol    History of hiatal hernia 1990s   "fixed it w/scope down my throat"   Hypertension    Myocardial infarction (HCC) 05/2013   stents: left PDA, 1st OM at [facility]   Obesity    OSA (obstructive sleep apnea)    Persistent atrial fibrillation (HCC)    Pneumonia ~ 1992   QT prolongation 07/15/2016   Seasonal allergies    "I take Allegra prn" (07/14/2016)   Stroke (HCC)    Tobacco abuse 12/16/2016    Past Surgical History:  Procedure Laterality Date   APPENDECTOMY  1984   ATRIAL FIBRILLATION ABLATION N/A 03/01/2020   Procedure: ATRIAL FIBRILLATION ABLATION;  Surgeon: Hillis Range,  MD;  [facility]: [facility];  Service: Cardiovascular;  Laterality: N/A;   CARDIOVERSION N/A 05/27/2016   Procedure: CARDIOVERSION;  Surgeon: Yates Decamp, MD;  Location: [facility] ENDOSCOPY;  Service: Cardiovascular;  Laterality: N/A;   CARDIOVERSION N/A 06/11/2016   Procedure: CARDIOVERSION;  Surgeon: Yates Decamp, MD;  Location: [facility] ENDOSCOPY;  Service: Cardiovascular;  Laterality: N/A;   CARDIOVERSION N/A 03/21/2020   Procedure: CARDIOVERSION;  Surgeon: Sande Rives, MD;  Location: [facility] ENDOSCOPY;  Service: Cardiovascular;  Laterality: N/A;   CORONARY ANGIOPLASTY WITH STENT PLACEMENT  05/30/2013   "2 stents put in at [facility]" & /stent card   CORONARY IMAGING/OCT  N/A 09/17/2021   Procedure: INTRAVASCULAR IMAGING/OCT;  Surgeon: Elder Negus, MD;  Location: [facility];  Service: Cardiovascular;  Laterality: N/A;   CORONARY PRESSURE/FFR STUDY N/A 09/21/2018   Procedure: INTRAVASCULAR PRESSURE WIRE/FFR STUDY;  Surgeon: Yates Decamp, MD;  Location: [facility];  Service: Cardiovascular;  Laterality: N/A;   CORONARY PRESSURE/FFR STUDY N/A 03/21/2019   Procedure: INTRAVASCULAR PRESSURE WIRE/FFR STUDY;  Surgeon: Yates Decamp, MD;  [facility]: [facility];  Service: Cardiovascular;  Laterality: N/A;   CORONARY PRESSURE/FFR STUDY N/A 01/24/2020   Procedure: INTRAVASCULAR PRESSURE WIRE/FFR STUDY;  Surgeon: Yates Decamp, MD;  [facility]: [facility];  Service: Cardiovascular;  Laterality: N/A;   CORONARY PRESSURE/FFR STUDY N/A 04/04/2022   Procedure: INTRAVASCULAR PRESSURE WIRE/FFR STUDY;  Surgeon: Lyn Records, MD;  [facility]: [facility];  Service: Cardiovascular;  Laterality: N/A;   CORONARY STENT INTERVENTION N/A 11/27/2017   Procedure: CORONARY STENT INTERVENTION;  Surgeon: Yates Decamp, MD;  [facility]: [facility];  Service: Cardiovascular;  Laterality: N/A;   CORONARY STENT INTERVENTION N/A 03/21/2019   Procedure: CORONARY STENT INTERVENTION;  Surgeon: Yates Decamp, MD;  [facility];  Service: Cardiovascular;  Laterality: N/A;   CORONARY STENT INTERVENTION N/A 08/13/2021   Procedure: CORONARY STENT INTERVENTION;  Surgeon: Elder Negus, MD;  [facility];  Service: Cardiovascular;  Laterality: N/A;   CORONARY STENT INTERVENTION N/A 09/17/2021   Procedure: CORONARY STENT INTERVENTION;  Surgeon: Elder Negus, MD;  [facility]: [facility];  Service: Cardiovascular;  Laterality: N/A;   CORONARY ULTRASOUND/IVUS N/A 08/13/2021   Procedure: Intravascular Ultrasound/IVUS;  Surgeon: Elder Negus, MD;  [facility]: [facility];  Service: Cardiovascular;  Laterality: N/A;    ELECTROPHYSIOLOGIC STUDY N/A 11/13/2016   Procedure: Atrial Fibrillation Ablation;  Surgeon: Hillis Range, MD;  Location: [facility] INVASIVE CV LAB;  Service: Cardiovascular;  Laterality: N/A;   ESOPHAGOGASTRODUODENOSCOPY (EGD) WITH ESOPHAGEAL DILATION  1990s X 2   KNEE ARTHROSCOPY Right 1986; ~ 1995 X 2   LAPAROSCOPIC CHOLECYSTECTOMY  2015   LEFT HEART CATH AND CORONARY ANGIOGRAPHY N/A 11/27/2017   Procedure: LEFT HEART CATH AND CORONARY ANGIOGRAPHY;  Surgeon: Yates Decamp, MD;  [facility];  Service: Cardiovascular;  Laterality: N/A;   LEFT HEART CATH AND CORONARY ANGIOGRAPHY N/A 09/21/2018   Procedure: LEFT HEART CATH AND CORONARY ANGIOGRAPHY;  Surgeon: Yates Decamp, MD;  [facility];  Service: Cardiovascular;  Laterality: N/A;   LEFT HEART CATH AND CORONARY ANGIOGRAPHY N/A 03/21/2019   Procedure: LEFT HEART CATH AND CORONARY ANGIOGRAPHY;  Surgeon: Yates Decamp, MD;  [facility];  Service: Cardiovascular;  Laterality: N/A;   LEFT HEART CATH AND CORONARY ANGIOGRAPHY N/A 04/04/2022   Procedure: LEFT HEART CATH AND CORONARY ANGIOGRAPHY;  Surgeon: Katrinka Blazing,  Barry Dienes, MD;  Location: [facility];  Service: Cardiovascular;  Laterality: N/A;   REPAIR OF ESOPHAGUS  1998   perforation of the distal esophagus from bad heartburn   RIGHT/LEFT HEART CATH AND CORONARY ANGIOGRAPHY N/A 01/24/2020   Procedure: RIGHT/LEFT HEART CATH AND CORONARY ANGIOGRAPHY;  Surgeon: Yates Decamp, MD;  [facility];  Service: Cardiovascular;  Laterality: N/A;   RIGHT/LEFT HEART CATH AND CORONARY ANGIOGRAPHY N/A 08/13/2021   Procedure: RIGHT/LEFT HEART CATH AND CORONARY ANGIOGRAPHY;  Surgeon: Elder Negus, MD;  [facility];  Service: Cardiovascular;  Laterality: N/A;   TEE WITHOUT CARDIOVERSION N/A 11/13/2016   Procedure: TRANSESOPHAGEAL ECHOCARDIOGRAM (TEE);  Surgeon: Vesta Mixer, MD;  Location:  [facility] ENDOSCOPY;  Service: Cardiovascular;  Laterality: N/A;      Current Outpatient Medications  Medication Sig Dispense Refill   ACCU-CHEK GUIDE test strip USE 1 STRIP ONCE DAILY     Accu-Chek Softclix Lancets lancets 1 each daily.     acetaminophen (TYLENOL) 500 MG tablet Take 500 mg by mouth 2 (two) times daily as needed for moderate pain.     Alirocumab (PRALUENT) 75 MG/ML SOAJ Inject 1 mL (75 mg total) into the skin every 14 (fourteen) days. 6 mL 1   amLODipine (NORVASC) 10 MG tablet Take 1 tablet (10 mg total) by mouth daily. 90 tablet 3   apixaban (ELIQUIS) 5 MG TABS tablet Take 1 tablet by mouth twice daily 180 tablet 1   bisacodyl (DULCOLAX) 5 MG EC tablet Take 5 mg by mouth daily as needed.     Blood Glucose Monitoring Suppl (ACCU-CHEK GUIDE ME) w/Device KIT See admin instructions.     clopidogrel (PLAVIX) 75 MG tablet Take 1 tablet (75 mg total) by mouth daily. 90 tablet 0   docusate sodium (COLACE) 100 MG capsule Take 100 mg by mouth 2 (two) times daily as needed.     fexofenadine (ALLEGRA) 180 MG tablet Take 180 mg by mouth daily as needed (seasonal allergies).     gabapentin (NEURONTIN) 300 MG capsule Take 300 mg by mouth at bedtime.     hydrALAZINE (APRESOLINE) 50 MG tablet TAKE ADDITIONAL DOSE AS NEEDED FOR BP GREATER THAN 150/90 MMHG 90 tablet 3   hydrochlorothiazide (MICROZIDE) 12.5 MG capsule Take 1 capsule (12.5 mg total) by mouth daily. 90 capsule 3   ibuprofen (ADVIL) 200 MG tablet Take by mouth.     isosorbide mononitrate (IMDUR) 60 MG 24 hr tablet Take 1 tablet (60 mg total) by mouth daily. 90 tablet 3   metoprolol tartrate (LOPRESSOR) 100 MG tablet Take 1 tablet (100 mg total) by mouth 2 (two) times daily. 180 tablet 3   nitroGLYCERIN (NITROSTAT) 0.4 MG SL tablet Place 1 tablet (0.4 mg total) under the tongue every 5 (five) minutes as needed for chest pain. 25 tablet 11   OZEMPIC, 0.25 OR 0.5 MG/DOSE, 2 MG/3ML SOPN SMARTSIG:0.25 Milliliter(s) SUB-Q Once a Week     pantoprazole (PROTONIX) 20 MG tablet Take 1 tablet (20 mg  total) by mouth 2 (two) times daily. 180 tablet 0   sacubitril-valsartan (ENTRESTO) 97-103 MG Take 1 tablet by mouth 2 (two) times daily. 60 tablet 8   spironolactone (ALDACTONE) 25 MG tablet Take 1 tablet (25 mg total) by mouth daily. 90 tablet 3   No current facility-administered medications for this visit.    Allergies:   Codeine, Crestor [rosuvastatin], Albuterol, Dilaudid [hydromorphone], Isosorbide, Lexapro [escitalopram], Lipitor [atorvastatin calcium], Erythromycin, and Oxycodone  Social History:  The patient  reports that he has been smoking cigarettes. He started smoking about 35 years ago. He has a 6.25 pack-year smoking history. He has never used smokeless tobacco. He reports that he does not drink alcohol and does not use drugs.   Family History:  The patient's family history includes Breast cancer in his mother; Cancer in his father; Congestive Heart Failure in his father; Diabetes in his father and mother; Heart attack in his mother; Heart attack (age of onset: 26) in his father; Heart disease (age of onset: 77) in his mother; Hypertension in his father and mother; Lung cancer in his father and paternal uncle; Tongue cancer in his mother.    ROS:  Please see the history of present illness.   Otherwise, review of systems are positive for angina as noted above.   All other systems are reviewed and negative.    PHYSICAL EXAM: VS:  BP 116/74   Pulse 72   Ht 5' 9.5" (1.765 m)   Wt 224 lb (101.6 kg)   SpO2 95%   BMI 32.60 kg/m  , BMI Body mass index is 32.6 kg/m. GEN: Well nourished, well developed, in no acute distress HEENT: normal Neck: no JVD, carotid bruits, or masses Cardiac: RRR; no murmurs, rubs, or gallops,no edema  Respiratory:  clear to auscultation bilaterally, normal work of breathing GI: soft, nontender, nondistended, + BS MS: no deformity or atrophy; 2+ PT pulse Skin: warm and dry, no rash Neuro:  Strength and sensation are intact Psych: euthymic mood,  full affect   EKG:   The ekg ordered 04/01/23 demonstrates NSR, PVC   Recent Labs: 10/01/2022: ALT 25 12/12/2022: BUN 18; Creatinine, Ser 0.73; Hemoglobin 15.7; NT-Pro BNP <36; Platelets 291; Potassium 4.0; Sodium 140   Lipid Panel    Component Value Date/Time   CHOL 265 (H) 10/01/2022 1050   TRIG 164 (H) 10/01/2022 1050   HDL 42 10/01/2022 1050   CHOLHDL 6.3 (H) 10/01/2022 1050   CHOLHDL 5.0 11/27/2017 0634   VLDL 16 11/27/2017 0634   LDLCALC 192 (H) 10/01/2022 1050     Other studies Reviewed: Additional studies/ records that were reviewed today with results demonstrating: labs reviewed.   ASSESSMENT AND PLAN:  CAD: Continue aggressive secondary prevention.  Has some angina.  Manageable at this time.  If it gets worse, would have a low threshold for a repeat cath.  He feels that overall he is stable.  He will let us know if symptoms get worse. Chronic diastolic heart failure: Whole food, plant-based diet.  Avoid processed foods.  Avoid excessive salt.  LV ejection fraction improved to 50%.  Mixed cardiomyopathy. Hypertension: Low-salt diet.  Blood pressure well-controlled.  Continue current medications. Statin intolerant: Tolerating Praluent.  Taking it regularly.  Will recheck lipids today.  He is fasting.  Whole food plant-based diet.  Avoids red meat for the most part.  Avoids processed foods.  He has been intolerant of statins and intolerant of Zetia as well.  LDL was 129 in April 2024 when checked with his primary care doctor.  Would like to see his LDL around 70.  Will check with lipid clinic to see whether there are any other options to help get his cholesterol down. Tobacco abuse: Down to less than 1/2 ppd.  Cut back from 1.5 ppd.  Patches caused a rash. Paroxysmal atrial fibrillation: Stable.  Improved after ablation in 4/21.  Multiple cardioversions and have tried multiple antiarrhythmics in the past, none of  which were effective.   Current medicines are reviewed at  length with the patient today.  The patient concerns regarding his medicines were addressed.  The following changes have been made:  No change  Labs/ tests ordered today include:  No orders of the defined types were placed in this encounter.   Recommend 150 minutes/week of aerobic exercise Low fat, low carb, high fiber diet recommended  Disposition:   FU in 1 year   Signed, Lance Muss, MD  04/06/2023 10:03 AM    [facility] [address] [place], [place], [place]  [address] Phone: [phone]; Fax: [phone]

## 2023-04-14 ENCOUNTER — Encounter: Payer: Self-pay | Admitting: Interventional Cardiology

## 2023-04-14 NOTE — Telephone Encounter (Signed)
I spoke with patient.  He reports 2 recent episodes of chest pain.  Both relieved with one NTG. Today he has been able to do his normal activities without chest pain.  I explained to patient since this was a change in his symptoms he would need office visit for evaluation.  Appointment made for patient to see Ronie Spies, PA on June 18 at 8:50.  I told patient we would call him if sooner appointment became available.  I let patient know I tried to reach him earlier today. He reports he was having phone issues. He does not have another phone number where he can be reached.   Patient made aware if he has more chest pain he should go to ED for evaluation

## 2023-04-14 NOTE — Telephone Encounter (Signed)
Tried again to reach patient.  Left message to call office 

## 2023-04-14 NOTE — Telephone Encounter (Signed)
Left message to call office

## 2023-04-14 NOTE — Telephone Encounter (Signed)
Call placed to patient to get more information.  Left message to call office.  Message also sent to patient through my chart

## 2023-04-14 NOTE — Telephone Encounter (Signed)
Sooner appointment is available with Dr Wyline Mood on DOD day at [facility] on June 13.  I spoke with patient and scheduled him to see Dr Wyline Mood on June 13 at 9:30.  Patient aware this is at the [facility] office

## 2023-04-16 ENCOUNTER — Telehealth: Payer: Self-pay | Admitting: *Deleted

## 2023-04-16 ENCOUNTER — Ambulatory Visit: Payer: Medicare Other | Attending: Internal Medicine | Admitting: Internal Medicine

## 2023-04-16 ENCOUNTER — Encounter: Payer: Self-pay | Admitting: Internal Medicine

## 2023-04-16 VITALS — BP 122/86 | HR 61 | Ht 69.0 in | Wt 224.2 lb

## 2023-04-16 DIAGNOSIS — G720 Drug-induced myopathy: Secondary | ICD-10-CM | POA: Diagnosis not present

## 2023-04-16 DIAGNOSIS — T466X5D Adverse effect of antihyperlipidemic and antiarteriosclerotic drugs, subsequent encounter: Secondary | ICD-10-CM | POA: Diagnosis not present

## 2023-04-16 DIAGNOSIS — I25118 Atherosclerotic heart disease of native coronary artery with other forms of angina pectoris: Secondary | ICD-10-CM

## 2023-04-16 MED ORDER — ISOSORBIDE MONONITRATE ER 60 MG PO TB24: 90.0000 mg | ORAL_TABLET | Freq: Every day | ORAL | 3 refills | Status: DC

## 2023-04-16 NOTE — Telephone Encounter (Addendum)
Cardiac Catheterization scheduled at [facility] for: Monday April 20, 2023 7:30 AM Arrival time [facility] Main Entrance A at: 5:30 AM  Nothing to eat after midnight prior to procedure, clear liquids until 5 AM day of procedure.  Medication instructions: -Do not take:  Eliquis-do not take starting Saturday  04/18/23 until post procedure  HCTZ/Spironolactone-AM of procedure  Ozempic-weekly on Mondays-pt will hold until post procedure -Other usual morning medications can be taken with sips of water including aspirin 81 mg and Plavix 75 mg  Confirmed patient has responsible adult to drive home post procedure and be with patient first 24 hours after arriving home.  Plan to go home the same day, you will only stay overnight if medically necessary.  Reviewed procedure instructions with patient.

## 2023-04-16 NOTE — Progress Notes (Signed)
Cardiology Office Note:    Date:  04/16/2023   ID:  JHORDY FAZZINO, DOB 1966-04-13, MRN [id number]  PCP:  Noni Saupe, MD    [facility]Care Providers Cardiologist:  Lance Muss, MD Electrophysiologist:  Will Jorja Loa, MD     Referring MD: Noni Saupe, MD   No chief complaint on file. Angina  History of Present Illness:    Danny Kelley is a 57 y.o. male , recently saw Dr. Eldridge Dace 10 days ago. Per his note:" former patient of Dr. Katrinka Blazing with a hx of chronic systolic heart failure => transitioned to chronic diastolic heart failure, hypertension, hyperlipidemia (statin intolerant), obstructive sleep apnea compliant with CPAP, tobacco use.  Patient has history of TIA in Feb 2018 while on anticoagulants, paroxysmal atrial fibrillation status post ablation 11/13/2016 and 03/01/2020 (Multaq and sotalol ineffective). Circumflex stents 2014 and 2019. Last cath 09/2021 where the LAD and right coronary was stented and the diagonal was angioplastied.    Unable to use statin therapy due to myalgias.   Also sees Dr. Elberta Fortis for PAF: "Currently on Eliquis.  CHA2DS2-VASc of 5.  Recent cardiac monitor with an elevated PVC burden but no episodes of atrial fibrillation. "  He is here for an acute visit with angina. He has had chest pressure most pronounced yesterday. He notes he was sitting watching TV and had coffee. He took nitro SL yesterday around 5:30 PM. The nitro helped his relieve the chest pressure.  He denies SOB. The plan was low threshold for LHC  Past Medical History:  Diagnosis Date   Arthritis    "a little in my right knee" (07/14/2016)   Atrial fibrillation (HCC)    CAD (coronary artery disease)    Chronic congestive heart failure with left ventricular diastolic dysfunction (HCC)    Complication of anesthesia    Pt reports "they have a hard time waking me up"   GERD (gastroesophageal reflux disease)    hx   Headache    "with every heart issue that I  have" (07/14/2016)   High cholesterol    History of hiatal hernia 1990s   "fixed it w/scope down my throat"   Hypertension    Myocardial infarction (HCC) 05/2013   stents: left PDA, 1st OM at [facility]   Obesity    OSA (obstructive sleep apnea)    Persistent atrial fibrillation (HCC)    Pneumonia ~ 1992   QT prolongation 07/15/2016   Seasonal allergies    "I take Allegra prn" (07/14/2016)   Stroke (HCC)    Tobacco abuse 12/16/2016    Past Surgical History:  Procedure Laterality Date   APPENDECTOMY  1984   ATRIAL FIBRILLATION ABLATION N/A 03/01/2020   Procedure: ATRIAL FIBRILLATION ABLATION;  Surgeon: Hillis Range, MD;  [facility];  Service: Cardiovascular;  Laterality: N/A;   CARDIOVERSION N/A 05/27/2016   Procedure: CARDIOVERSION;  Surgeon: Yates Decamp, MD;  Location: [facility] ENDOSCOPY;  Service: Cardiovascular;  Laterality: N/A;   CARDIOVERSION N/A 06/11/2016   Procedure: CARDIOVERSION;  Surgeon: Yates Decamp, MD;  Location: [facility] ENDOSCOPY;  Service: Cardiovascular;  Laterality: N/A;   CARDIOVERSION N/A 03/21/2020   Procedure: CARDIOVERSION;  Surgeon: Sande Rives, MD;  Location: [facility] ENDOSCOPY;  Service: Cardiovascular;  Laterality: N/A;   CORONARY ANGIOPLASTY WITH STENT PLACEMENT  05/30/2013   "2 stents put in at [facility]" & /stent card   CORONARY IMAGING/OCT N/A 09/17/2021   Procedure: INTRAVASCULAR IMAGING/OCT;  Surgeon: Truett Mainland  J, MD;  [facility];  Service: Cardiovascular;  Laterality: N/A;   CORONARY PRESSURE/FFR STUDY N/A 09/21/2018   Procedure: INTRAVASCULAR PRESSURE WIRE/FFR STUDY;  Surgeon: Yates Decamp, MD;  [facility];  Service: Cardiovascular;  Laterality: N/A;   CORONARY PRESSURE/FFR STUDY N/A 03/21/2019   Procedure: INTRAVASCULAR PRESSURE WIRE/FFR STUDY;  Surgeon: Yates Decamp, MD;  [facility];  Service: Cardiovascular;  Laterality: N/A;   CORONARY PRESSURE/FFR STUDY N/A 01/24/2020    Procedure: INTRAVASCULAR PRESSURE WIRE/FFR STUDY;  Surgeon: Yates Decamp, MD;  [facility];  Service: Cardiovascular;  Laterality: N/A;   CORONARY PRESSURE/FFR STUDY N/A 04/04/2022   Procedure: INTRAVASCULAR PRESSURE WIRE/FFR STUDY;  Surgeon: Lyn Records, MD;  [facility];  Service: Cardiovascular;  Laterality: N/A;   CORONARY STENT INTERVENTION N/A 11/27/2017   Procedure: CORONARY STENT INTERVENTION;  Surgeon: Yates Decamp, MD;  [facility];  Service: Cardiovascular;  Laterality: N/A;   CORONARY STENT INTERVENTION N/A 03/21/2019   Procedure: CORONARY STENT INTERVENTION;  Surgeon: Yates Decamp, MD;  [facility];  Service: Cardiovascular;  Laterality: N/A;   CORONARY STENT INTERVENTION N/A 08/13/2021   Procedure: CORONARY STENT INTERVENTION;  Surgeon: Elder Negus, MD;  [facility];  Service: Cardiovascular;  Laterality: N/A;   CORONARY STENT INTERVENTION N/A 09/17/2021   Procedure: CORONARY STENT INTERVENTION;  Surgeon: Elder Negus, MD;  [facility];  Service: Cardiovascular;  Laterality: N/A;   CORONARY ULTRASOUND/IVUS N/A 08/13/2021   Procedure: Intravascular Ultrasound/IVUS;  Surgeon: Elder Negus, MD;  [facility];  Service: Cardiovascular;  Laterality: N/A;   ELECTROPHYSIOLOGIC STUDY N/A 11/13/2016   Procedure: Atrial Fibrillation Ablation;  Surgeon: Hillis Range, MD;  Location: [facility] INVASIVE CV LAB;  Service: Cardiovascular;  Laterality: N/A;   ESOPHAGOGASTRODUODENOSCOPY (EGD) WITH ESOPHAGEAL DILATION  1990s X 2   KNEE ARTHROSCOPY Right 1986; ~ 1995 X 2   LAPAROSCOPIC CHOLECYSTECTOMY  2015   LEFT HEART CATH AND CORONARY ANGIOGRAPHY N/A 11/27/2017   Procedure: LEFT HEART CATH AND CORONARY ANGIOGRAPHY;  Surgeon: Yates Decamp, MD;  [facility];  Service: Cardiovascular;  Laterality: N/A;   LEFT HEART CATH AND CORONARY ANGIOGRAPHY N/A 09/21/2018    Procedure: LEFT HEART CATH AND CORONARY ANGIOGRAPHY;  Surgeon: Yates Decamp, MD;  [facility];  Service: Cardiovascular;  Laterality: N/A;   LEFT HEART CATH AND CORONARY ANGIOGRAPHY N/A 03/21/2019   Procedure: LEFT HEART CATH AND CORONARY ANGIOGRAPHY;  Surgeon: Yates Decamp, MD;  [facility];  Service: Cardiovascular;  Laterality: N/A;   LEFT HEART CATH AND CORONARY ANGIOGRAPHY N/A 04/04/2022   Procedure: LEFT HEART CATH AND CORONARY ANGIOGRAPHY;  Surgeon: Lyn Records, MD;  [facility];  Service: Cardiovascular;  Laterality: N/A;   REPAIR OF ESOPHAGUS  1998   perforation of the distal esophagus from bad heartburn   RIGHT/LEFT HEART CATH AND CORONARY ANGIOGRAPHY N/A 01/24/2020   Procedure: RIGHT/LEFT HEART CATH AND CORONARY ANGIOGRAPHY;  Surgeon: Yates Decamp, MD;  [facility];  Service: Cardiovascular;  Laterality: N/A;   RIGHT/LEFT HEART CATH AND CORONARY ANGIOGRAPHY N/A 08/13/2021   Procedure: RIGHT/LEFT HEART CATH AND CORONARY ANGIOGRAPHY;  Surgeon: Elder Negus, MD;  [facility];  Service: Cardiovascular;  Laterality: N/A;   TEE WITHOUT CARDIOVERSION N/A 11/13/2016   Procedure: TRANSESOPHAGEAL ECHOCARDIOGRAM (TEE);  Surgeon: Deloris Ping  Nahser, MD;  [facility] ENDOS[facility];  Service: Cardiovascular;  Laterality: N/A;    Current Medications: Current Meds  Medication Sig   ACCU-CHEK GUIDE test strip USE 1 STRIP ONCE DAILY   Accu-Chek Softclix Lancets lancets 1 each daily.   acetaminophen (TYLENOL) 500 MG tablet Take 500 mg by mouth 2 (two) times daily as needed for moderate pain.   Alirocumab (PRALUENT) 75 MG/ML SOAJ Inject 1 mL (75 mg total) into the skin every 14 (fourteen) days.   amLODipine (NORVASC) 10 MG tablet Take 1 tablet (10 mg total) by mouth daily.   apixaban (ELIQUIS) 5 MG TABS tablet Take 1 tablet by mouth twice daily   bisacodyl (DULCOLAX) 5 MG EC tablet Take 5 mg by mouth daily as needed.    Blood Glucose Monitoring Suppl (ACCU-CHEK GUIDE ME) w/Device KIT See admin instructions.   clopidogrel (PLAVIX) 75 MG tablet Take 1 tablet (75 mg total) by mouth daily.   docusate sodium (COLACE) 100 MG capsule Take 100 mg by mouth 2 (two) times daily as needed.   fexofenadine (ALLEGRA) 180 MG tablet Take 180 mg by mouth daily as needed (seasonal allergies).   gabapentin (NEURONTIN) 300 MG capsule Take 300 mg by mouth at bedtime.   hydrALAZINE (APRESOLINE) 50 MG tablet TAKE ADDITIONAL DOSE AS NEEDED FOR BP GREATER THAN 150/90 MMHG   hydrochlorothiazide (MICROZIDE) 12.5 MG capsule Take 1 capsule (12.5 mg total) by mouth daily.   ibuprofen (ADVIL) 200 MG tablet Take by mouth.   metoprolol tartrate (LOPRESSOR) 100 MG tablet Take 1 tablet (100 mg total) by mouth 2 (two) times daily.   nitroGLYCERIN (NITROSTAT) 0.4 MG SL tablet Place 1 tablet (0.4 mg total) under the tongue every 5 (five) minutes as needed for chest pain.   OZEMPIC, 0.25 OR 0.5 MG/DOSE, 2 MG/3ML SOPN SMARTSIG:0.25 Milliliter(s) SUB-Q Once a Week   pantoprazole (PROTONIX) 20 MG tablet Take 1 tablet (20 mg total) by mouth 2 (two) times daily.   sacubitril-valsartan (ENTRESTO) 97-103 MG Take 1 tablet by mouth 2 (two) times daily.   spironolactone (ALDACTONE) 25 MG tablet Take 1 tablet (25 mg total) by mouth daily.   [DISCONTINUED] isosorbide mononitrate (IMDUR) 60 MG 24 hr tablet Take 1 tablet (60 mg total) by mouth daily.     Allergies:   Codeine, Crestor [rosuvastatin], Albuterol, Dilaudid [hydromorphone], Isosorbide, Lexapro [escitalopram], Lipitor [atorvastatin calcium], Erythromycin, and Oxycodone   Social History   Socioeconomic History   Marital status: Married    Spouse name: Not on file   Number of children: 2   Years of education: Not on file   Highest education level: Not on file  Occupational History   Not on file  Tobacco Use   Smoking status: Every Day    Packs/day: 0.25    Years: 25.00    Additional pack  years: 0.00    Total pack years: 6.25    Types: Cigarettes    Start date: 1989   Smokeless tobacco: Never  Vaping Use   Vaping Use: Never used  Substance and Sexual Activity   Alcohol use: No   Drug use: No   Sexual activity: Yes  Other Topics Concern   Not on file  Social History Narrative   Pt lives in [place] with spouse and 16 year old son.   disabled   Social Determinants of Health   Financial Resource Strain: Not on file  Food Insecurity: Not on file  Transportation Needs: Not on file  Physical Activity: Not on file  Stress: Not on file  Social Connections: Not on file     Family History: The patient's family history includes Breast cancer in his mother; Cancer in his father; Congestive Heart Failure in his father; Diabetes in his father and mother; Heart attack in his mother; Heart attack (age of onset: 72) in his father; Heart disease (age of onset: 23) in his mother; Hypertension in his father and mother; Lung cancer in his father and paternal uncle; Tongue cancer in his mother. There is no history of Colon cancer, Esophageal cancer, Inflammatory bowel disease, Liver disease, Pancreatic cancer, Rectal cancer, or Stomach cancer.  ROS:   Please see the history of present illness.     All other systems reviewed and are negative.  EKGs/Labs/Other Studies Reviewed:    The following studies were reviewed today:   EKG:  EKG is  ordered today.  The ekg ordered today demonstrates   04/16/2023- NSR  Recent Labs: 10/01/2022: ALT 25 12/12/2022: BUN 18; Creatinine, Ser 0.73; Hemoglobin 15.7; NT-Pro BNP <36; Platelets 291; Potassium 4.0; Sodium 140   Recent Lipid Panel    Component Value Date/Time   CHOL 265 (H) 10/01/2022 1050   TRIG 164 (H) 10/01/2022 1050   HDL 42 10/01/2022 1050   CHOLHDL 6.3 (H) 10/01/2022 1050   CHOLHDL 5.0 11/27/2017 0634   VLDL 16 11/27/2017 0634   LDLCALC 192 (H) 10/01/2022 1050     Risk Assessment/Calculations:         Physical  Exam:    VS:  BP 122/86 (BP Location: Left Arm, Patient Position: Sitting, Cuff Size: Large)   Pulse 61   Ht 5\' 9"  (1.753 m)   Wt 224 lb 3.2 oz (101.7 kg)   SpO2 96%   BMI 33.11 kg/m     Wt Readings from Last 3 Encounters:  04/16/23 224 lb 3.2 oz (101.7 kg)  04/06/23 224 lb (101.6 kg)  04/01/23 228 lb 12.8 oz (103.8 kg)     GEN:  Well nourished, well developed in no acute distress HEENT: Normal LYMPHATICS: No lymphadenopathy CARDIAC: RRR, no murmurs, rubs, gallops RESPIRATORY:  Clear to auscultation without rales, wheezing or rhonchi  ABDOMEN: Soft, non-tender, non-distended MUSCULOSKELETAL:  No edema; No deformity  SKIN: Warm and dry NEUROLOGIC:  Alert and oriented x 3 PSYCHIATRIC:  Normal affect   ASSESSMENT:   Acute Visit Issues Addressed: Angina: He has recurrent angina. Last [facility] 04/04/2022 showed 2 jailed diags that may be contributing. He had a 50% prox stenosis prior to his prox-mid LAD stent that may have progressed. Will plan for repeat LHC. He is in sinus rhythm, will have him hold his eliquis. Good R radial pulse. Normal prior renal fxn.  PLAN:    In order of problems listed above:  LHC Increase imdur to 90 mg daily APP FU in 1  month      Informed Consent   Shared Decision Making/Informed Consent The risks [stroke (1 in 1000), death (1 in 1000), kidney failure [usually temporary] (1 in 500), bleeding (1 in 200), allergic reaction [possibly serious] (1 in 200)], benefits (diagnostic support and management of coronary artery disease) and alternatives of a cardiac catheterization were discussed in detail with Mr. Ent and he is willing to proceed.       Medication Adjustments/Labs and Tests Ordered: Current medicines are reviewed at length with the patient today.  Concerns regarding medicines are outlined above.  Orders Placed This Encounter  Procedures   Basic metabolic panel   CBC  EKG 12-Lead   Meds ordered this encounter  Medications   isosorbide  mononitrate (IMDUR) 60 MG 24 hr tablet    Sig: Take 1.5 tablets (90 mg total) by mouth daily.    Dispense:  45 tablet    Refill:  3    Dose change.    Patient Instructions  Medication Instructions:  Increase isosorbide to 90mg  once daily (1.5 tabs).  Please follow medication changes for cath  *If you need a refill on your cardiac medications before your next appointment, please call your pharmacy*   Lab Work: Blood work today  If you have labs (blood work) drawn today and your tests are completely normal, you will receive your results only by: MyChart Message (if you have MyChart) OR A paper copy in the mail If you have any lab test that is abnormal or we need to change your treatment, we will call you to review the results.   Testing/Procedures: Your physician has requested that you have a cardiac catheterization. Cardiac catheterization is used to diagnose and/or treat various heart conditions. Doctors may recommend this procedure for a number of different reasons. The most common reason is to evaluate chest pain. Chest pain can be a symptom of coronary artery disease (CAD), and cardiac catheterization can show whether plaque is narrowing or blocking your heart's arteries. This procedure is also used to evaluate the valves, as well as measure the blood flow and oxygen levels in different parts of your heart. For further information please visit [email or web address] Please follow instruction sheet, as given.    Follow-Up: At [facility], you and your health needs are our priority.  As part of our continuing mission to provide you with exceptional heart care, we have created designated Provider Care Teams.  These Care Teams include your primary Cardiologist (physician) and Advanced Practice Providers (APPs -  Physician Assistants and Nurse Practitioners) who all work together to provide you with the care you need, when you need it.  We recommend signing up for the patient  portal called "MyChart".  Sign up information is provided on this After Visit Summary.  MyChart is used to connect with patients for Virtual Visits (Telemedicine).  Patients are able to view lab/test results, encounter notes, upcoming appointments, etc.  Non-urgent messages can be sent to your provider as well.   To learn more about what you can do with MyChart, go to [email or web address].    Your next appointment:   1 month(s)  Provider:   Any APP    Other Instructions  [facility] A DEPT OF [facility] AT [facility] [address] [place] [id number] [place] [address] Dept: [phone] Loc: [phone]  ALMA POLINO  04/16/2023  You are scheduled for a Cardiac Catheterization on Monday, June 17 with Dr. Bryan Lemma.  1. Please arrive at the [facility] (Main Entrance A) at [facility]: [address] [place], [place] [address] at 5:30 AM (This time is 2 hour(s) before your procedure to ensure your preparation). Free valet parking service is available. You will check in at ADMITTING. The support person will be asked to wait in the waiting room.  It is OK to have someone drop you off and come back when you are ready to be discharged.    Special note: Every effort is made to have your procedure done on time. Please understand that emergencies sometimes delay scheduled procedures.  2. Diet: Do not eat solid  foods after midnight.  The patient may have clear liquids until 5am upon the day of the procedure.  3. Labs: You will need to have blood drawn on TODAY  4. Medication instructions in preparation for your procedure:   Contrast Allergy: No  Stop taking Eliquis (Apixiban) on Saturday, June 15.  Take last dose Friday evening  Stop taking, HTCZ (Hydrochlorothiazide) Monday, June 17,   On the morning of your procedure, take your Plavix/Clopidogrel and any morning medicines NOT listed above.  You may  use sips of water.  5. Plan to go home the same day, you will only stay overnight if medically necessary. 6. Bring a current list of your medications and current insurance cards. 7. You MUST have a responsible person to drive you home. 8. Someone MUST be with you the first 24 hours after you arrive home or your discharge will be delayed. 9. Please wear clothes that are easy to get on and off and wear slip-on shoes.  Thank you for allowing [place] to care for you!   -- [place] [facility]    Signed, Maisie Fus, MD  04/16/2023 10:04 AM    [facility]

## 2023-04-16 NOTE — Patient Instructions (Addendum)
Medication Instructions:  Increase isosorbide to 90mg  once daily (1.5 tabs).  Please follow medication changes for cath  *If you need a refill on your cardiac medications before your next appointment, please call your pharmacy*   Lab Work: Blood work today  If you have labs (blood work) drawn today and your tests are completely normal, you will receive your results only by: MyChart Message (if you have MyChart) OR A paper copy in the mail If you have any lab test that is abnormal or we need to change your treatment, we will call you to review the results.   Testing/Procedures: Your physician has requested that you have a cardiac catheterization. Cardiac catheterization is used to diagnose and/or treat various heart conditions. Doctors may recommend this procedure for a number of different reasons. The most common reason is to evaluate chest pain. Chest pain can be a symptom of coronary artery disease (CAD), and cardiac catheterization can show whether plaque is narrowing or blocking your heart's arteries. This procedure is also used to evaluate the valves, as well as measure the blood flow and oxygen levels in different parts of your heart. For further information please visit [email or web address] Please follow instruction sheet, as given.    Follow-Up: At [facility], you and your health needs are our priority.  As part of our continuing mission to provide you with exceptional heart care, we have created designated Provider Care Teams.  These Care Teams include your primary Cardiologist (physician) and Advanced Practice Providers (APPs -  Physician Assistants and Nurse Practitioners) who all work together to provide you with the care you need, when you need it.  We recommend signing up for the patient portal called "MyChart".  Sign up information is provided on this After Visit Summary.  MyChart is used to connect with patients for Virtual Visits (Telemedicine).  Patients are able  to view lab/test results, encounter notes, upcoming appointments, etc.  Non-urgent messages can be sent to your provider as well.   To learn more about what you can do with MyChart, go to [email or web address].    Your next appointment:   1 month(s)  Provider:   Any APP    Other Instructions  [facility] A DEPT OF [facility] AT [facility] [address] [place] [id number] [place] [address] Dept: [phone] Loc: [phone]  KYLIL CHIARI  04/16/2023  You are scheduled for a Cardiac Catheterization on Monday, June 17 with Dr. Bryan Lemma.  1. Please arrive at the [facility] (Main Entrance A) at [facility]: [address] [place], [place] [address] at 5:30 AM (This time is 2 hour(s) before your procedure to ensure your preparation). Free valet parking service is available. You will check in at ADMITTING. The support person will be asked to wait in the waiting room.  It is OK to have someone drop you off and come back when you are ready to be discharged.    Special note: Every effort is made to have your procedure done on time. Please understand that emergencies sometimes delay scheduled procedures.  2. Diet: Do not eat solid foods after midnight.  The patient may have clear liquids until 5am upon the day of the procedure.  3. Labs: You will need to have blood drawn on TODAY  4. Medication instructions in preparation for your procedure:   Contrast Allergy: No  Stop taking Eliquis (Apixiban) on Saturday, June 15.  Take last dose Friday  evening  Stop taking, HTCZ (Hydrochlorothiazide) Monday, June 17,   On the morning of your procedure, take your Plavix/Clopidogrel and any morning medicines NOT listed above.  You may use sips of water.  5. Plan to go home the same day, you will only stay overnight if medically necessary. 6. Bring a current list of your medications and current insurance  cards. 7. You MUST have a responsible person to drive you home. 8. Someone MUST be with you the first 24 hours after you arrive home or your discharge will be delayed. 9. Please wear clothes that are easy to get on and off and wear slip-on shoes.  Thank you for allowing [place] to care for you!   -- [place] [facility]

## 2023-04-16 NOTE — H&P (View-Only) (Signed)
Cardiology Office Note:    Date:  04/16/2023   ID:  Danny Kelley, DOB 05/04/1966, MRN [id number]  PCP:  Redding, John F. II, MD    [facility] Providers Cardiologist:  Jayadeep Varanasi, MD Electrophysiologist:  Will Martin Camnitz, MD     Referring MD: Redding, John F. II, MD   No chief complaint on file. Angina  History of Present Illness:    Danny Kelley is a 57 y.o. male , recently saw Dr. Varanasi 10 days ago. Per his note:" former patient of Dr. Smith with a hx of chronic systolic heart failure => transitioned to chronic diastolic heart failure, hypertension, hyperlipidemia (statin intolerant), obstructive sleep apnea compliant with CPAP, tobacco use.  Patient has history of TIA in Feb 2018 while on anticoagulants, paroxysmal atrial fibrillation status post ablation 11/13/2016 and 03/01/2020 (Multaq and sotalol ineffective). Circumflex stents 2014 and 2019. Last cath 09/2021 where the LAD and right coronary was stented and the diagonal was angioplastied.    Unable to use statin therapy due to myalgias.   Also sees Dr. Camnitz for PAF: "Currently on Eliquis.  CHA2DS2-VASc of 5.  Recent cardiac monitor with an elevated PVC burden but no episodes of atrial fibrillation. "  He is here for an acute visit with angina. He has had chest pressure most pronounced yesterday. He notes he was sitting watching TV and had coffee. He took nitro SL yesterday around 5:30 PM. The nitro helped his relieve the chest pressure.  He denies SOB. The plan was low threshold for LHC  Past Medical History:  Diagnosis Date   Arthritis    "a little in my right knee" (07/14/2016)   Atrial fibrillation (HCC)    CAD (coronary artery disease)    Chronic congestive heart failure with left ventricular diastolic dysfunction (HCC)    Complication of anesthesia    Pt reports "they have a hard time waking me up"   GERD (gastroesophageal reflux disease)    hx   Headache    "with every heart issue that I  have" (07/14/2016)   High cholesterol    History of hiatal hernia 1990s   "fixed it w/scope down my throat"   Hypertension    Myocardial infarction (HCC) 05/2013   stents: left PDA, 1st OM at [facility]   Obesity    OSA (obstructive sleep apnea)    Persistent atrial fibrillation (HCC)    Pneumonia ~ 1992   QT prolongation 07/15/2016   Seasonal allergies    "I take Allegra prn" (07/14/2016)   Stroke (HCC)    Tobacco abuse 12/16/2016    Past Surgical History:  Procedure Laterality Date   APPENDECTOMY  1984   ATRIAL FIBRILLATION ABLATION N/A 03/01/2020   Procedure: ATRIAL FIBRILLATION ABLATION;  Surgeon: Allred, James, MD;  [facility];  Service: Cardiovascular;  Laterality: N/A;   CARDIOVERSION N/A 05/27/2016   Procedure: CARDIOVERSION;  Surgeon: Jay Ganji, MD;  [facility]: [facility];  Service: Cardiovascular;  Laterality: N/A;   CARDIOVERSION N/A 06/11/2016   Procedure: CARDIOVERSION;  Surgeon: Jay Ganji, MD;  [facility];  Service: Cardiovascular;  Laterality: N/A;   CARDIOVERSION N/A 03/21/2020   Procedure: CARDIOVERSION;  Surgeon: O'Neal, [place] Thomas, MD;  [facility]: [facility];  Service: Cardiovascular;  Laterality: N/A;   CORONARY ANGIOPLASTY WITH STENT PLACEMENT  05/30/2013   "2 stents put in at [facility]" & /stent card   CORONARY IMAGING/OCT N/A 09/17/2021   Procedure: INTRAVASCULAR IMAGING/OCT;  Surgeon: Patwardhan, Manish   J, MD;  [facility];  Service: Cardiovascular;  Laterality: N/A;   CORONARY PRESSURE/FFR STUDY N/A 09/21/2018   Procedure: INTRAVASCULAR PRESSURE WIRE/FFR STUDY;  Surgeon: Ganji, Jay, MD;  [facility];  Service: Cardiovascular;  Laterality: N/A;   CORONARY PRESSURE/FFR STUDY N/A 03/21/2019   Procedure: INTRAVASCULAR PRESSURE WIRE/FFR STUDY;  Surgeon: Ganji, Jay, MD;  [facility];  Service: Cardiovascular;  Laterality: N/A;   CORONARY PRESSURE/FFR STUDY N/A 01/24/2020    Procedure: INTRAVASCULAR PRESSURE WIRE/FFR STUDY;  Surgeon: Ganji, Jay, MD;  [facility];  Service: Cardiovascular;  Laterality: N/A;   CORONARY PRESSURE/FFR STUDY N/A 04/04/2022   Procedure: INTRAVASCULAR PRESSURE WIRE/FFR STUDY;  Surgeon: Smith, Tripp W, MD;  [facility];  Service: Cardiovascular;  Laterality: N/A;   CORONARY STENT INTERVENTION N/A 11/27/2017   Procedure: CORONARY STENT INTERVENTION;  Surgeon: Ganji, Jay, MD;  [facility];  Service: Cardiovascular;  Laterality: N/A;   CORONARY STENT INTERVENTION N/A 03/21/2019   Procedure: CORONARY STENT INTERVENTION;  Surgeon: Ganji, Jay, MD;  [facility];  Service: Cardiovascular;  Laterality: N/A;   CORONARY STENT INTERVENTION N/A 08/13/2021   Procedure: CORONARY STENT INTERVENTION;  Surgeon: Patwardhan, Manish J, MD;  [facility];  Service: Cardiovascular;  Laterality: N/A;   CORONARY STENT INTERVENTION N/A 09/17/2021   Procedure: CORONARY STENT INTERVENTION;  Surgeon: Patwardhan, Manish J, MD;  [facility];  Service: Cardiovascular;  Laterality: N/A;   CORONARY ULTRASOUND/IVUS N/A 08/13/2021   Procedure: Intravascular Ultrasound/IVUS;  Surgeon: Patwardhan, Manish J, MD;  [facility];  Service: Cardiovascular;  Laterality: N/A;   ELECTROPHYSIOLOGIC STUDY N/A 11/13/2016   Procedure: Atrial Fibrillation Ablation;  Surgeon: James Allred, MD;  [facility];  Service: Cardiovascular;  Laterality: N/A;   ESOPHAGOGASTRODUODENOSCOPY (EGD) WITH ESOPHAGEAL DILATION  1990s X 2   KNEE ARTHROSCOPY Right 1986; ~ 1995 X 2   LAPAROSCOPIC CHOLECYSTECTOMY  2015   LEFT HEART CATH AND CORONARY ANGIOGRAPHY N/A 11/27/2017   Procedure: LEFT HEART CATH AND CORONARY ANGIOGRAPHY;  Surgeon: Ganji, Jay, MD;  [facility];  Service: Cardiovascular;  Laterality: N/A;   LEFT HEART CATH AND CORONARY ANGIOGRAPHY N/A 09/21/2018    Procedure: LEFT HEART CATH AND CORONARY ANGIOGRAPHY;  Surgeon: Ganji, Jay, MD;  [facility];  Service: Cardiovascular;  Laterality: N/A;   LEFT HEART CATH AND CORONARY ANGIOGRAPHY N/A 03/21/2019   Procedure: LEFT HEART CATH AND CORONARY ANGIOGRAPHY;  Surgeon: Ganji, Jay, MD;  [facility];  Service: Cardiovascular;  Laterality: N/A;   LEFT HEART CATH AND CORONARY ANGIOGRAPHY N/A 04/04/2022   Procedure: LEFT HEART CATH AND CORONARY ANGIOGRAPHY;  Surgeon: Smith, Cashel W, MD;  [facility];  Service: Cardiovascular;  Laterality: N/A;   REPAIR OF ESOPHAGUS  1998   perforation of the distal esophagus from bad heartburn   RIGHT/LEFT HEART CATH AND CORONARY ANGIOGRAPHY N/A 01/24/2020   Procedure: RIGHT/LEFT HEART CATH AND CORONARY ANGIOGRAPHY;  Surgeon: Ganji, Jay, MD;  [facility];  Service: Cardiovascular;  Laterality: N/A;   RIGHT/LEFT HEART CATH AND CORONARY ANGIOGRAPHY N/A 08/13/2021   Procedure: RIGHT/LEFT HEART CATH AND CORONARY ANGIOGRAPHY;  Surgeon: Patwardhan, Manish J, MD;  [facility];  Service: Cardiovascular;  Laterality: N/A;   TEE WITHOUT CARDIOVERSION N/A 11/13/2016   Procedure: TRANSESOPHAGEAL ECHOCARDIOGRAM (TEE);  Surgeon: Philip J   Nahser, MD;  [facility];  Service: Cardiovascular;  Laterality: N/A;    Current Medications: Current Meds  Medication Sig   ACCU-CHEK GUIDE test strip USE 1 STRIP ONCE DAILY   Accu-Chek Softclix Lancets lancets 1 each daily.   acetaminophen (TYLENOL) 500 MG tablet Take 500 mg by mouth 2 (two) times daily as needed for moderate pain.   Alirocumab (PRALUENT) 75 MG/ML SOAJ Inject 1 mL (75 mg total) into the skin every 14 (fourteen) days.   amLODipine (NORVASC) 10 MG tablet Take 1 tablet (10 mg total) by mouth daily.   apixaban (ELIQUIS) 5 MG TABS tablet Take 1 tablet by mouth twice daily   bisacodyl (DULCOLAX) 5 MG EC tablet Take 5 mg by mouth daily as needed.    Blood Glucose Monitoring Suppl (ACCU-CHEK GUIDE ME) w/Device KIT See admin instructions.   clopidogrel (PLAVIX) 75 MG tablet Take 1 tablet (75 mg total) by mouth daily.   docusate sodium (COLACE) 100 MG capsule Take 100 mg by mouth 2 (two) times daily as needed.   fexofenadine (ALLEGRA) 180 MG tablet Take 180 mg by mouth daily as needed (seasonal allergies).   gabapentin (NEURONTIN) 300 MG capsule Take 300 mg by mouth at bedtime.   hydrALAZINE (APRESOLINE) 50 MG tablet TAKE ADDITIONAL DOSE AS NEEDED FOR BP GREATER THAN 150/90 MMHG   hydrochlorothiazide (MICROZIDE) 12.5 MG capsule Take 1 capsule (12.5 mg total) by mouth daily.   ibuprofen (ADVIL) 200 MG tablet Take by mouth.   metoprolol tartrate (LOPRESSOR) 100 MG tablet Take 1 tablet (100 mg total) by mouth 2 (two) times daily.   nitroGLYCERIN (NITROSTAT) 0.4 MG SL tablet Place 1 tablet (0.4 mg total) under the tongue every 5 (five) minutes as needed for chest pain.   OZEMPIC, 0.25 OR 0.5 MG/DOSE, 2 MG/3ML SOPN SMARTSIG:0.25 Milliliter(s) SUB-Q Once a Week   pantoprazole (PROTONIX) 20 MG tablet Take 1 tablet (20 mg total) by mouth 2 (two) times daily.   sacubitril-valsartan (ENTRESTO) 97-103 MG Take 1 tablet by mouth 2 (two) times daily.   spironolactone (ALDACTONE) 25 MG tablet Take 1 tablet (25 mg total) by mouth daily.   [DISCONTINUED] isosorbide mononitrate (IMDUR) 60 MG 24 hr tablet Take 1 tablet (60 mg total) by mouth daily.     Allergies:   Codeine, Crestor [rosuvastatin], Albuterol, Dilaudid [hydromorphone], Isosorbide, Lexapro [escitalopram], Lipitor [atorvastatin calcium], Erythromycin, and Oxycodone   Social History   Socioeconomic History   Marital status: Married    Spouse name: Not on file   Number of children: 2   Years of education: Not on file   Highest education level: Not on file  Occupational History   Not on file  Tobacco Use   Smoking status: Every Day    Packs/day: 0.25    Years: 25.00    Additional pack  years: 0.00    Total pack years: 6.25    Types: Cigarettes    Start date: 1989   Smokeless tobacco: Never  Vaping Use   Vaping Use: Never used  Substance and Sexual Activity   Alcohol use: No   Drug use: No   Sexual activity: Yes  Other Topics Concern   Not on file  Social History Narrative   Pt lives in [place] with spouse and 28 year old son.   disabled   Social Determinants of Health   Financial Resource Strain: Not on file  Food Insecurity: Not on file  Transportation Needs: Not on file  Physical Activity: Not on file    Stress: Not on file  Social Connections: Not on file     Family History: The patient's family history includes Breast cancer in his mother; Cancer in his father; Congestive Heart Failure in his father; Diabetes in his father and mother; Heart attack in his mother; Heart attack (age of onset: 53) in his father; Heart disease (age of onset: 46) in his mother; Hypertension in his father and mother; Lung cancer in his father and paternal uncle; Tongue cancer in his mother. There is no history of Colon cancer, Esophageal cancer, Inflammatory bowel disease, Liver disease, Pancreatic cancer, Rectal cancer, or Stomach cancer.  ROS:   Please see the history of present illness.     All other systems reviewed and are negative.  EKGs/Labs/Other Studies Reviewed:    The following studies were reviewed today:   EKG:  EKG is  ordered today.  The ekg ordered today demonstrates   04/16/2023- NSR  Recent Labs: 10/01/2022: ALT 25 12/12/2022: BUN 18; Creatinine, Ser 0.73; Hemoglobin 15.7; NT-Pro BNP <36; Platelets 291; Potassium 4.0; Sodium 140   Recent Lipid Panel    Component Value Date/Time   CHOL 265 (H) 10/01/2022 1050   TRIG 164 (H) 10/01/2022 1050   HDL 42 10/01/2022 1050   CHOLHDL 6.3 (H) 10/01/2022 1050   CHOLHDL 5.0 11/27/2017 0634   VLDL 16 11/27/2017 0634   LDLCALC 192 (H) 10/01/2022 1050     Risk Assessment/Calculations:         Physical  Exam:    VS:  BP 122/86 (BP Location: Left Arm, Patient Position: Sitting, Cuff Size: Large)   Pulse 61   Ht 5' 9" (1.753 m)   Wt 224 lb 3.2 oz (101.7 kg)   SpO2 96%   BMI 33.11 kg/m     Wt Readings from Last 3 Encounters:  04/16/23 224 lb 3.2 oz (101.7 kg)  04/06/23 224 lb (101.6 kg)  04/01/23 228 lb 12.8 oz (103.8 kg)     GEN:  Well nourished, well developed in no acute distress HEENT: Normal LYMPHATICS: No lymphadenopathy CARDIAC: RRR, no murmurs, rubs, gallops RESPIRATORY:  Clear to auscultation without rales, wheezing or rhonchi  ABDOMEN: Soft, non-tender, non-distended MUSCULOSKELETAL:  No edema; No deformity  SKIN: Warm and dry NEUROLOGIC:  Alert and oriented x 3 PSYCHIATRIC:  Normal affect   ASSESSMENT:   Acute Visit Issues Addressed: Angina: He has recurrent angina. Last LHC 04/04/2022 showed 2 jailed diags that may be contributing. He had a 50% prox stenosis prior to his prox-mid LAD stent that may have progressed. Will plan for repeat LHC. He is in sinus rhythm, will have him hold his eliquis. Good R radial pulse. Normal prior renal fxn.  PLAN:    In order of problems listed above:  LHC Increase imdur to 90 mg daily APP FU in 1  month      Informed Consent   Shared Decision Making/Informed Consent The risks [stroke (1 in 1000), death (1 in 1000), kidney failure [usually temporary] (1 in 500), bleeding (1 in 200), allergic reaction [possibly serious] (1 in 200)], benefits (diagnostic support and management of coronary artery disease) and alternatives of a cardiac catheterization were discussed in detail with Danny Kelley and he is willing to proceed.       Medication Adjustments/Labs and Tests Ordered: Current medicines are reviewed at length with the patient today.  Concerns regarding medicines are outlined above.  Orders Placed This Encounter  Procedures   Basic metabolic panel   CBC     EKG 12-Lead   Meds ordered this encounter  Medications   isosorbide  mononitrate (IMDUR) 60 MG 24 hr tablet    Sig: Take 1.5 tablets (90 mg total) by mouth daily.    Dispense:  45 tablet    Refill:  3    Dose change.    Patient Instructions  Medication Instructions:  Increase isosorbide to 90mg once daily (1.5 tabs).  Please follow medication changes for cath  *If you need a refill on your cardiac medications before your next appointment, please call your pharmacy*   Lab Work: Blood work today  If you have labs (blood work) drawn today and your tests are completely normal, you will receive your results only by: MyChart Message (if you have MyChart) OR A paper copy in the mail If you have any lab test that is abnormal or we need to change your treatment, we will call you to review the results.   Testing/Procedures: Your physician has requested that you have a cardiac catheterization. Cardiac catheterization is used to diagnose and/or treat various heart conditions. Doctors may recommend this procedure for a number of different reasons. The most common reason is to evaluate chest pain. Chest pain can be a symptom of coronary artery disease (CAD), and cardiac catheterization can show whether plaque is narrowing or blocking your heart's arteries. This procedure is also used to evaluate the valves, as well as measure the blood flow and oxygen levels in different parts of your heart. For further information please visit www.cardiosmart.org. Please follow instruction sheet, as given.    Follow-Up: At [facility], you and your health needs are our priority.  As part of our continuing mission to provide you with exceptional heart care, we have created designated Provider Care Teams.  These Care Teams include your primary Cardiologist (physician) and Advanced Practice Providers (APPs -  Physician Assistants and Nurse Practitioners) who all work together to provide you with the care you need, when you need it.  We recommend signing up for the patient  portal called "MyChart".  Sign up information is provided on this After Visit Summary.  MyChart is used to connect with patients for Virtual Visits (Telemedicine).  Patients are able to view lab/test results, encounter notes, upcoming appointments, etc.  Non-urgent messages can be sent to your provider as well.   To learn more about what you can do with MyChart, go to [email or web address].    Your next appointment:   1 month(s)  Provider:   Any APP    Other Instructions  [facility] OF [facility] AT [id number] [place] [address] Dept: [phone] Loc: [phone]  Jentzen D Oleary  04/16/2023  You are scheduled for a Cardiac Catheterization on Monday, June 17 with Dr. David Harding.  1. Please arrive at the [facility] (Main Entrance A) at [facility]: [address] [place], [place] [address] at 5:30 AM (This time is 2 hour(s) before your procedure to ensure your preparation). Free valet parking service is available. You will check in at ADMITTING. The support person will be asked to wait in the waiting room.  It is OK to have someone drop you off and come back when you are ready to be discharged.    Special note: Every effort is made to have your procedure done on time. Please understand that emergencies sometimes delay scheduled procedures.  2. Diet: Do not eat solid   foods after midnight.  The patient may have clear liquids until 5am upon the day of the procedure.  3. Labs: You will need to have blood drawn on TODAY  4. Medication instructions in preparation for your procedure:   Contrast Allergy: No  Stop taking Eliquis (Apixiban) on Saturday, June 15.  Take last dose Friday evening  Stop taking, HTCZ (Hydrochlorothiazide) Monday, June 17,   On the morning of your procedure, take your Plavix/Clopidogrel and any morning medicines NOT listed above.  You may  use sips of water.  5. Plan to go home the same day, you will only stay overnight if medically necessary. 6. Bring a current list of your medications and current insurance cards. 7. You MUST have a responsible person to drive you home. 8. Someone MUST be with you the first 24 hours after you arrive home or your discharge will be delayed. 9. Please wear clothes that are easy to get on and off and wear slip-on shoes.  Thank you for allowing us to care for you!   -- [facility]    Signed, Haywood Meinders E, MD  04/16/2023 10:04 AM    [facility] 

## 2023-04-17 LAB — CBC
Hematocrit: 47.4 % (ref 37.5–51.0)
Hemoglobin: 16.2 g/dL (ref 13.0–17.7)
MCH: 28.7 pg (ref 26.6–33.0)
MCHC: 34.2 g/dL (ref 31.5–35.7)
MCV: 84 fL (ref 79–97)
Platelets: 281 10*3/uL (ref 150–450)
RBC: 5.64 x10E6/uL (ref 4.14–5.80)
RDW: 14.2 % (ref 11.6–15.4)
WBC: 8 10*3/uL (ref 3.4–10.8)

## 2023-04-17 LAB — BASIC METABOLIC PANEL
BUN/Creatinine Ratio: 19 (ref 9–20)
BUN: 15 mg/dL (ref 6–24)
CO2: 24 mmol/L (ref 20–29)
Calcium: 8.8 mg/dL (ref 8.7–10.2)
Chloride: 105 mmol/L (ref 96–106)
Creatinine, Ser: 0.79 mg/dL (ref 0.76–1.27)
Glucose: 132 mg/dL — ABNORMAL HIGH (ref 70–99)
Potassium: 3.9 mmol/L (ref 3.5–5.2)
Sodium: 141 mmol/L (ref 134–144)
eGFR: 106 mL/min/{1.73_m2} (ref >59)

## 2023-04-20 ENCOUNTER — Encounter (HOSPITAL_COMMUNITY)
Admission: RE | Disposition: A | Discharge status: Home / Self Care | Payer: Self-pay | Source: Home / Self Care | Attending: Cardiology

## 2023-04-20 ENCOUNTER — Ambulatory Visit (HOSPITAL_COMMUNITY)
Admission: RE | Admit: 2023-04-20 | Discharge: 2023-04-20 | Disposition: A | Discharge status: Home / Self Care | Payer: Medicare Other | Attending: Cardiology | Admitting: Cardiology

## 2023-04-20 DIAGNOSIS — Z7901 Long term (current) use of anticoagulants: Secondary | ICD-10-CM | POA: Insufficient documentation

## 2023-04-20 DIAGNOSIS — G4733 Obstructive sleep apnea (adult) (pediatric): Secondary | ICD-10-CM | POA: Insufficient documentation

## 2023-04-20 DIAGNOSIS — I25118 Atherosclerotic heart disease of native coronary artery with other forms of angina pectoris: Secondary | ICD-10-CM

## 2023-04-20 DIAGNOSIS — I5042 Chronic combined systolic (congestive) and diastolic (congestive) heart failure: Secondary | ICD-10-CM | POA: Diagnosis not present

## 2023-04-20 DIAGNOSIS — Z8673 Personal history of transient ischemic attack (TIA), and cerebral infarction without residual deficits: Secondary | ICD-10-CM | POA: Insufficient documentation

## 2023-04-20 DIAGNOSIS — E785 Hyperlipidemia, unspecified: Secondary | ICD-10-CM | POA: Diagnosis not present

## 2023-04-20 DIAGNOSIS — F17210 Nicotine dependence, cigarettes, uncomplicated: Secondary | ICD-10-CM | POA: Diagnosis not present

## 2023-04-20 DIAGNOSIS — Z955 Presence of coronary angioplasty implant and graft: Secondary | ICD-10-CM | POA: Insufficient documentation

## 2023-04-20 DIAGNOSIS — I480 Paroxysmal atrial fibrillation: Secondary | ICD-10-CM | POA: Insufficient documentation

## 2023-04-20 DIAGNOSIS — Z7902 Long term (current) use of antithrombotics/antiplatelets: Secondary | ICD-10-CM | POA: Diagnosis not present

## 2023-04-20 DIAGNOSIS — I110 Hypertensive heart disease with heart failure: Secondary | ICD-10-CM | POA: Diagnosis not present

## 2023-04-20 HISTORY — PX: LEFT HEART CATH AND CORONARY ANGIOGRAPHY: CATH118249

## 2023-04-20 LAB — GLUCOSE, CAPILLARY
Glucose-Capillary: 121 mg/dL — ABNORMAL HIGH (ref 70–99)
Glucose-Capillary: 127 mg/dL — ABNORMAL HIGH (ref 70–99)

## 2023-04-20 SURGERY — LEFT HEART CATH AND CORONARY ANGIOGRAPHY
Anesthesia: LOCAL

## 2023-04-20 MED ORDER — VERAPAMIL HCL 2.5 MG/ML IV SOLN
INTRAVENOUS | Status: DC | PRN
  Administered 2023-04-20: 10 mL via INTRA_ARTERIAL

## 2023-04-20 MED ORDER — SODIUM CHLORIDE 0.9 % IV SOLN: 250.0000 mL | INTRAVENOUS | Status: DC | PRN

## 2023-04-20 MED ORDER — LIDOCAINE HCL (PF) 1 % IJ SOLN
INTRAMUSCULAR | Status: AC
  Filled 2023-04-20: qty 30

## 2023-04-20 MED ORDER — HEPARIN (PORCINE) IN NACL 1000-0.9 UT/500ML-% IV SOLN
INTRAVENOUS | Status: DC | PRN
  Administered 2023-04-20 (×2): 500 mL

## 2023-04-20 MED ORDER — ASPIRIN 81 MG PO CHEW: 81.0000 mg | CHEWABLE_TABLET | ORAL | Status: DC

## 2023-04-20 MED ORDER — FENTANYL CITRATE (PF) 100 MCG/2ML IJ SOLN
INTRAMUSCULAR | Status: AC
  Filled 2023-04-20: qty 2

## 2023-04-20 MED ORDER — SODIUM CHLORIDE 0.9% FLUSH: 3.0000 mL | INTRAVENOUS | Status: DC | PRN

## 2023-04-20 MED ORDER — SODIUM CHLORIDE 0.9 % WEIGHT BASED INFUSION
3.0000 mL/kg/h | INTRAVENOUS | Status: AC
  Administered 2023-04-20: 3 mL/kg/h via INTRAVENOUS

## 2023-04-20 MED ORDER — HYDRALAZINE HCL 20 MG/ML IJ SOLN: 10.0000 mg | INTRAMUSCULAR | Status: DC | PRN

## 2023-04-20 MED ORDER — SODIUM CHLORIDE 0.9% FLUSH: 3.0000 mL | Freq: Two times a day (BID) | INTRAVENOUS | Status: DC

## 2023-04-20 MED ORDER — IOHEXOL 350 MG/ML SOLN
INTRAVENOUS | Status: DC | PRN
  Administered 2023-04-20: 65 mL via INTRA_ARTERIAL

## 2023-04-20 MED ORDER — LABETALOL HCL 5 MG/ML IV SOLN: 10.0000 mg | INTRAVENOUS | Status: DC | PRN

## 2023-04-20 MED ORDER — SODIUM CHLORIDE 0.9 % WEIGHT BASED INFUSION: 1.0000 mL/kg/h | INTRAVENOUS | Status: DC

## 2023-04-20 MED ORDER — MIDAZOLAM HCL 2 MG/2ML IJ SOLN
INTRAMUSCULAR | Status: DC | PRN
  Administered 2023-04-20: 2 mg via INTRAVENOUS

## 2023-04-20 MED ORDER — VERAPAMIL HCL 2.5 MG/ML IV SOLN
INTRAVENOUS | Status: AC
  Filled 2023-04-20: qty 2

## 2023-04-20 MED ORDER — HEPARIN SODIUM (PORCINE) 1000 UNIT/ML IJ SOLN
INTRAMUSCULAR | Status: AC
  Filled 2023-04-20: qty 10

## 2023-04-20 MED ORDER — HEPARIN SODIUM (PORCINE) 1000 UNIT/ML IJ SOLN
INTRAMUSCULAR | Status: DC | PRN
  Administered 2023-04-20: 5000 [IU] via INTRAVENOUS

## 2023-04-20 MED ORDER — LIDOCAINE HCL (PF) 1 % IJ SOLN
INTRAMUSCULAR | Status: DC | PRN
  Administered 2023-04-20: 2 mL

## 2023-04-20 MED ORDER — FENTANYL CITRATE (PF) 100 MCG/2ML IJ SOLN
INTRAMUSCULAR | Status: DC | PRN
  Administered 2023-04-20: 25 ug via INTRAVENOUS

## 2023-04-20 MED ORDER — ACETAMINOPHEN 325 MG PO TABS: 650.0000 mg | ORAL_TABLET | ORAL | Status: DC | PRN

## 2023-04-20 MED ORDER — CLOPIDOGREL BISULFATE 75 MG PO TABS: 75.0000 mg | ORAL_TABLET | ORAL | Status: DC

## 2023-04-20 MED ORDER — ONDANSETRON HCL 4 MG/2ML IJ SOLN: 4.0000 mg | Freq: Four times a day (QID) | INTRAMUSCULAR | Status: DC | PRN

## 2023-04-20 SURGICAL SUPPLY — 10 items
CATH OPTITORQUE TIG 4.0 5F (CATHETERS) IMPLANT
DEVICE RAD COMP TR BAND LRG (VASCULAR PRODUCTS) IMPLANT
GLIDESHEATH SLEND SS 6F .021 (SHEATH) IMPLANT
GUIDEWIRE INQWIRE 1.5J.035X260 (WIRE) IMPLANT
INQWIRE 1.5J .035X260CM (WIRE) ×1
KIT HEART LEFT (KITS) ×1 IMPLANT
PACK CARDIAC CATHETERIZATION (CUSTOM PROCEDURE TRAY) ×1 IMPLANT
SHEATH PROBE COVER 6X72 (BAG) IMPLANT
TRANSDUCER W/STOPCOCK (MISCELLANEOUS) ×1 IMPLANT
TUBING CIL FLEX 10 FLL-RA (TUBING) ×1 IMPLANT

## 2023-04-20 NOTE — Interval H&P Note (Signed)
History and Physical Interval Note:  04/20/2023 7:30 AM  Danny Kelley  has presented today for surgery, with the diagnosis of angina.  The various methods of treatment have been discussed with the patient and family. After consideration of risks, benefits and other options for treatment, the patient has consented to  Procedure(s): LEFT HEART CATH AND CORONARY ANGIOGRAPHY (N/A)  PERCUTANEOUS CORONARY INTERVENTION  as a surgical intervention.  The patient's history has been reviewed, patient examined, no change in status, stable for surgery.  I have reviewed the patient's chart and labs.  Questions were answered to the patient's satisfaction.   Cath Lab Visit (complete for each Cath Lab visit)  Clinical Evaluation Leading to the Procedure:   ACS: No.  Non-ACS:    Anginal Classification: CCS III  Anti-ischemic medical therapy: Maximal Therapy (2 or Kelley classes of medications)  Non-Invasive Test Results: No non-invasive testing performed  Prior CABG: No previous CABG    Bryan Lemma

## 2023-04-20 NOTE — Progress Notes (Signed)
TR BAND REMOVAL  LOCATION:    right radial  DEFLATED PER PROTOCOL:    Yes.    TIME BAND OFF / DRESSING APPLIED: 04/20/23 at 1005   SITE UPON ARRIVAL:    Level 0  SITE AFTER BAND REMOVAL:    Level 0  CIRCULATION SENSATION AND MOVEMENT:    Within Normal Limits   Yes.    COMMENTS:

## 2023-04-21 ENCOUNTER — Ambulatory Visit: Payer: Medicare Other | Admitting: Physician Assistant

## 2023-04-21 ENCOUNTER — Encounter (HOSPITAL_COMMUNITY): Payer: Self-pay | Admitting: Cardiology

## 2023-05-04 ENCOUNTER — Other Ambulatory Visit: Payer: Self-pay

## 2023-05-04 DIAGNOSIS — I4821 Permanent atrial fibrillation: Secondary | ICD-10-CM

## 2023-05-04 DIAGNOSIS — I10 Essential (primary) hypertension: Secondary | ICD-10-CM

## 2023-05-04 DIAGNOSIS — I5041 Acute combined systolic (congestive) and diastolic (congestive) heart failure: Secondary | ICD-10-CM

## 2023-05-04 DIAGNOSIS — I5042 Chronic combined systolic (congestive) and diastolic (congestive) heart failure: Secondary | ICD-10-CM

## 2023-05-04 MED ORDER — APIXABAN 5 MG PO TABS: 5.0000 mg | ORAL_TABLET | Freq: Two times a day (BID) | ORAL | 1 refills | Status: DC

## 2023-05-04 MED ORDER — SPIRONOLACTONE 25 MG PO TABS: 25.0000 mg | ORAL_TABLET | Freq: Every day | ORAL | 3 refills | Status: DC

## 2023-05-04 MED ORDER — HYDRALAZINE HCL 50 MG PO TABS
50.0000 mg | ORAL_TABLET | Freq: Three times a day (TID) | ORAL | 3 refills | Status: DC
Start: 2023-05-04 — End: 2023-09-08

## 2023-05-04 MED ORDER — METOPROLOL TARTRATE 100 MG PO TABS
100.0000 mg | ORAL_TABLET | Freq: Two times a day (BID) | ORAL | 3 refills | Status: DC
Start: 2023-05-04 — End: 2024-01-15

## 2023-05-04 NOTE — Telephone Encounter (Signed)
Pt's pharmacy is requesting a refill on hydralazine. Would Dr. Eldridge Dace like to refill this medication? Please address

## 2023-05-04 NOTE — Telephone Encounter (Signed)
Prescription refill request for Eliquis received. Indication:afib Last office visit:6/24 Scr:0.79  6/24 Age: 57 Weight:101.6  kg  Prescription refilled

## 2023-05-15 ENCOUNTER — Other Ambulatory Visit: Payer: Self-pay | Admitting: Interventional Cardiology

## 2023-05-15 DIAGNOSIS — I25118 Atherosclerotic heart disease of native coronary artery with other forms of angina pectoris: Secondary | ICD-10-CM

## 2023-05-17 NOTE — Progress Notes (Deleted)
Cardiology Clinic Note   Patient Name: Danny Kelley Date of Encounter: 05/17/2023  Primary Care Provider:  Noni Saupe, MD Primary Cardiologist:  Lance Muss, MD  Patient Profile    57 year old male with history of chronic systolic heart failure, which transition to chronic diastolic heart failure, hypertension, hyperlipidemia (statin intolerant), tobacco abuse, history of TIA in 2018 while on anticoagulation, PAF status post ablation on 11/13/2016 and 03/01/2020, continues on Eliquis with CHA2DS2-VASc score 5.  CAD with stents to the circumflex in 2014 and 2019, LAD, the 2 diagonals were jailed, and 2 overlapping stents to the right coronary artery.  Last seen in the office by Dr. Carolan Clines on 04/16/2023 with complaints of recurrent chest pain and sent for cardiac catheterization.  Cardiac catheterization performed by Dr. Herbie Baltimore on 04/20/2023 revealed able coronary arteries from previous cardiac catheterization with no obvious culprit lesions besides downstream LAD disease and jailed diagonal branches.  EF of 50 to 55%.  Chest discomfort likely from small vessel CAD.  Past Medical History    Past Medical History:  Diagnosis Date   Arthritis    "a little in my right knee" (07/14/2016)   Atrial fibrillation (HCC)    CAD (coronary artery disease)    Chronic congestive heart failure with left ventricular diastolic dysfunction (HCC)    Complication of anesthesia    Pt reports "they have a hard time waking me up"   GERD (gastroesophageal reflux disease)    hx   Headache    "with every heart issue that I have" (07/14/2016)   High cholesterol    History of hiatal hernia 1990s   "fixed it w/scope down my throat"   Hypertension    Myocardial infarction (HCC) 05/2013   stents: left PDA, 1st OM at [facility]   Obesity    OSA (obstructive sleep apnea)    Persistent atrial fibrillation (HCC)    Pneumonia ~ 1992   QT prolongation 07/15/2016   Seasonal allergies    "I  take Allegra prn" (07/14/2016)   Stroke (HCC)    Tobacco abuse 12/16/2016   Past Surgical History:  Procedure Laterality Date   APPENDECTOMY  1984   ATRIAL FIBRILLATION ABLATION N/A 03/01/2020   Procedure: ATRIAL FIBRILLATION ABLATION;  Surgeon: Hillis Range, MD;  [facility];  Service: Cardiovascular;  Laterality: N/A;   CARDIOVERSION N/A 05/27/2016   Procedure: CARDIOVERSION;  Surgeon: Yates Decamp, MD;  Location: [facility] ENDOSCOPY;  Service: Cardiovascular;  Laterality: N/A;   CARDIOVERSION N/A 06/11/2016   Procedure: CARDIOVERSION;  Surgeon: Yates Decamp, MD;  Location: [facility] ENDOSCOPY;  Service: Cardiovascular;  Laterality: N/A;   CARDIOVERSION N/A 03/21/2020   Procedure: CARDIOVERSION;  Surgeon: Sande Rives, MD;  Location: [facility] ENDOSCOPY;  Service: Cardiovascular;  Laterality: N/A;   CORONARY ANGIOPLASTY WITH STENT PLACEMENT  05/30/2013   "2 stents put in at [facility]" & /stent card   CORONARY IMAGING/OCT N/A 09/17/2021   Procedure: INTRAVASCULAR IMAGING/OCT;  Surgeon: Elder Negus, MD;  [facility];  Service: Cardiovascular;  Laterality: N/A;   CORONARY PRESSURE/FFR STUDY N/A 09/21/2018   Procedure: INTRAVASCULAR PRESSURE WIRE/FFR STUDY;  Surgeon: Yates Decamp, MD;  [facility]: [facility];  Service: Cardiovascular;  Laterality: N/A;   CORONARY PRESSURE/FFR STUDY N/A 03/21/2019   Procedure: INTRAVASCULAR PRESSURE WIRE/FFR STUDY;  Surgeon: Yates Decamp, MD;  [facility];  Service: Cardiovascular;  Laterality: N/A;   CORONARY PRESSURE/FFR STUDY N/A 01/24/2020   Procedure: INTRAVASCULAR  PRESSURE WIRE/FFR STUDY;  Surgeon: Yates Decamp, MD;  Location: [facility] INVASIVE CV LAB;  Service: Cardiovascular;  Laterality: N/A;   CORONARY PRESSURE/FFR STUDY N/A 04/04/2022   Procedure: INTRAVASCULAR PRESSURE WIRE/FFR STUDY;  Surgeon: Lyn Records, MD;  [facility];  Service: Cardiovascular;  Laterality: N/A;   CORONARY STENT INTERVENTION  N/A 11/27/2017   Procedure: CORONARY STENT INTERVENTION;  Surgeon: Yates Decamp, MD;  [facility];  Service: Cardiovascular;  Laterality: N/A;   CORONARY STENT INTERVENTION N/A 03/21/2019   Procedure: CORONARY STENT INTERVENTION;  Surgeon: Yates Decamp, MD;  [facility];  Service: Cardiovascular;  Laterality: N/A;   CORONARY STENT INTERVENTION N/A 08/13/2021   Procedure: CORONARY STENT INTERVENTION;  Surgeon: Elder Negus, MD;  [facility];  Service: Cardiovascular;  Laterality: N/A;   CORONARY STENT INTERVENTION N/A 09/17/2021   Procedure: CORONARY STENT INTERVENTION;  Surgeon: Elder Negus, MD;  [facility];  Service: Cardiovascular;  Laterality: N/A;   CORONARY ULTRASOUND/IVUS N/A 08/13/2021   Procedure: Intravascular Ultrasound/IVUS;  Surgeon: Elder Negus, MD;  [facility];  Service: Cardiovascular;  Laterality: N/A;   ELECTROPHYSIOLOGIC STUDY N/A 11/13/2016   Procedure: Atrial Fibrillation Ablation;  Surgeon: Hillis Range, MD;  Location: [facility] INVASIVE CV LAB;  Service: Cardiovascular;  Laterality: N/A;   ESOPHAGOGASTRODUODENOSCOPY (EGD) WITH ESOPHAGEAL DILATION  1990s X 2   KNEE ARTHROSCOPY Right 1986; ~ 1995 X 2   LAPAROSCOPIC CHOLECYSTECTOMY  2015   LEFT HEART CATH AND CORONARY ANGIOGRAPHY N/A 11/27/2017   Procedure: LEFT HEART CATH AND CORONARY ANGIOGRAPHY;  Surgeon: Yates Decamp, MD;  [facility];  Service: Cardiovascular;  Laterality: N/A;   LEFT HEART CATH AND CORONARY ANGIOGRAPHY N/A 09/21/2018   Procedure: LEFT HEART CATH AND CORONARY ANGIOGRAPHY;  Surgeon: Yates Decamp, MD;  [facility];  Service: Cardiovascular;  Laterality: N/A;   LEFT HEART CATH AND CORONARY ANGIOGRAPHY N/A 03/21/2019   Procedure: LEFT HEART CATH AND CORONARY ANGIOGRAPHY;  Surgeon: Yates Decamp, MD;  [facility];  Service: Cardiovascular;  Laterality: N/A;   LEFT HEART  CATH AND CORONARY ANGIOGRAPHY N/A 04/04/2022   Procedure: LEFT HEART CATH AND CORONARY ANGIOGRAPHY;  Surgeon: Lyn Records, MD;  [facility];  Service: Cardiovascular;  Laterality: N/A;   LEFT HEART CATH AND CORONARY ANGIOGRAPHY N/A 04/20/2023   Procedure: LEFT HEART CATH AND CORONARY ANGIOGRAPHY;  Surgeon: Marykay Lex, MD;  Location: [facility] INVASIVE CV LAB;  Service: Cardiovascular;  Laterality: N/A;   REPAIR OF ESOPHAGUS  1998   perforation of the distal esophagus from bad heartburn   RIGHT/LEFT HEART CATH AND CORONARY ANGIOGRAPHY N/A 01/24/2020   Procedure: RIGHT/LEFT HEART CATH AND CORONARY ANGIOGRAPHY;  Surgeon: Yates Decamp, MD;  [facility];  Service: Cardiovascular;  Laterality: N/A;   RIGHT/LEFT HEART CATH AND CORONARY ANGIOGRAPHY N/A 08/13/2021   Procedure: RIGHT/LEFT HEART CATH AND CORONARY ANGIOGRAPHY;  Surgeon: Elder Negus, MD;  [facility];  Service: Cardiovascular;  Laterality: N/A;   TEE WITHOUT CARDIOVERSION N/A 11/13/2016   Procedure: TRANSESOPHAGEAL ECHOCARDIOGRAM (TEE);  Surgeon: Vesta Mixer, MD;  Location: [facility] ENDOSCOPY;  Service: Cardiovascular;  Laterality: N/A;    Allergies  Allergies  Allergen Reactions   Codeine Anaphylaxis   Crestor [Rosuvastatin] Other (See Comments)    Severe leg cramps   Albuterol Swelling   Dilaudid [Hydromorphone] Itching and Swelling  Isosorbide     Muscle pain and cramps   Lexapro [Escitalopram]     Have bad thoughts made anxiety worse    Lipitor [Atorvastatin Calcium] Other (See Comments)    myalgias   Erythromycin Other (See Comments)    Hallucinations    Oxycodone Itching and Rash    Swelling to face, hands, mouth Completley intolerant to any meds with oxycodone in it     History of Present Illness    Danny Kelley comes today for ongoing assessment and management of coronary artery disease as described above, hypertension, hyperlipidemia, paroxysmal atrial  fibrillation, status post cardiac catheterization on 04/20/2023 with patent stents to the LAD, circumflex and right coronary artery with 2 jailed diagonal branches.  Treated medically for chronic angina related to small vessel disease.  Home Medications    Current Outpatient Medications  Medication Sig Dispense Refill   ACCU-CHEK GUIDE test strip USE 1 STRIP ONCE DAILY     Accu-Chek Softclix Lancets lancets 1 each daily.     acetaminophen (TYLENOL) 500 MG tablet Take 500 mg by mouth 2 (two) times daily as needed for moderate pain.     Alirocumab (PRALUENT) 75 MG/ML SOAJ Inject 1 mL (75 mg total) into the skin every 14 (fourteen) days. 6 mL 1   amLODipine (NORVASC) 10 MG tablet Take 1 tablet (10 mg total) by mouth daily. 90 tablet 3   apixaban (ELIQUIS) 5 MG TABS tablet Take 1 tablet (5 mg total) by mouth 2 (two) times daily. 180 tablet 1   bisacodyl (DULCOLAX) 5 MG EC tablet Take 5 mg by mouth daily as needed.     Blood Glucose Monitoring Suppl (ACCU-CHEK GUIDE ME) w/Device KIT See admin instructions.     clopidogrel (PLAVIX) 75 MG tablet Take 1 tablet (75 mg total) by mouth daily. 90 tablet 0   docusate sodium (COLACE) 100 MG capsule Take 100 mg by mouth 2 (two) times daily as needed.     fexofenadine (ALLEGRA) 180 MG tablet Take 180 mg by mouth daily as needed (seasonal allergies).     gabapentin (NEURONTIN) 300 MG capsule Take 300 mg by mouth at bedtime.     hydrALAZINE (APRESOLINE) 50 MG tablet Take 1 tablet (50 mg total) by mouth 3 (three) times daily. TAKE ADDITIONAL DOSE AS NEEDED FOR BP GREATER THAN 150/90 MMHG 90 tablet 3   hydrochlorothiazide (MICROZIDE) 12.5 MG capsule Take 1 capsule (12.5 mg total) by mouth daily. 90 capsule 3   ibuprofen (ADVIL) 200 MG tablet Take by mouth.     isosorbide mononitrate (IMDUR) 60 MG 24 hr tablet Take 1.5 tablets (90 mg total) by mouth daily. 45 tablet 3   metoprolol tartrate (LOPRESSOR) 100 MG tablet Take 1 tablet (100 mg total) by mouth 2 (two) times  daily. 180 tablet 3   nitroGLYCERIN (NITROSTAT) 0.4 MG SL tablet Place 1 tablet (0.4 mg total) under the tongue every 5 (five) minutes as needed for chest pain. 25 tablet 11   OZEMPIC, 0.25 OR 0.5 MG/DOSE, 2 MG/3ML SOPN SMARTSIG:0.25 Milliliter(s) SUB-Q Once a Week     pantoprazole (PROTONIX) 20 MG tablet Take 1 tablet (20 mg total) by mouth 2 (two) times daily. 180 tablet 0   sacubitril-valsartan (ENTRESTO) 97-103 MG Take 1 tablet by mouth 2 (two) times daily. 60 tablet 8   spironolactone (ALDACTONE) 25 MG tablet Take 1 tablet (25 mg total) by mouth daily. 90 tablet 3   No current facility-administered medications for this visit.  Family History    Family History  Problem Relation Age of Onset   Hypertension Mother    Heart attack Mother    Heart disease Mother 39       CABG age 43   Breast cancer Mother    Tongue cancer Mother    Diabetes Mother    Hypertension Father    Cancer Father    Heart attack Father 55       Father had around 7 heart attacks per pt   Diabetes Father    Lung cancer Father    Congestive Heart Failure Father    Lung cancer Paternal Uncle    Colon cancer Neg Hx    Esophageal cancer Neg Hx    Inflammatory bowel disease Neg Hx    Liver disease Neg Hx    Pancreatic cancer Neg Hx    Rectal cancer Neg Hx    Stomach cancer Neg Hx    He indicated that his mother is deceased. He indicated that his father is deceased. He indicated that his sister is alive. He indicated that his maternal grandmother is deceased. He indicated that his maternal grandfather is deceased. He indicated that his paternal grandmother is deceased. He indicated that his paternal grandfather is deceased. He indicated that the status of his paternal uncle is unknown. He indicated that the status of his neg hx is unknown.  Social History    Social History   Socioeconomic History   Marital status: Married    Spouse name: Not on file   Number of children: 2   Years of education: Not on  file   Highest education level: Not on file  Occupational History   Not on file  Tobacco Use   Smoking status: Every Day    Current packs/day: 0.25    Average packs/day: 0.3 packs/day for 35.5 years (8.9 ttl pk-yrs)    Types: Cigarettes    Start date: 1989   Smokeless tobacco: Never  Vaping Use   Vaping status: Never Used  Substance and Sexual Activity   Alcohol use: No   Drug use: No   Sexual activity: Yes  Other Topics Concern   Not on file  Social History Narrative   Pt lives in [place] with spouse and 62 year old son.   disabled   Social Determinants of Corporate investment banker Strain: Not on file  Food Insecurity: Not on file  Transportation Needs: Not on file  Physical Activity: Not on file  Stress: Not on file  Social Connections: Not on file  Intimate Partner Violence: Not on file     Review of Systems    General:  No chills, fever, night sweats or weight changes.  Cardiovascular:  No chest pain, dyspnea on exertion, edema, orthopnea, palpitations, paroxysmal nocturnal dyspnea. Dermatological: No rash, lesions/masses Respiratory: No cough, dyspnea Urologic: No hematuria, dysuria Abdominal:   No nausea, vomiting, diarrhea, bright red blood per rectum, melena, or hematemesis Neurologic:  No visual changes, wkns, changes in mental status. All other systems reviewed and are otherwise negative except as noted above.       Physical Exam    VS:  There were no vitals taken for this visit. , BMI There is no height or weight on file to calculate BMI.     GEN: Well nourished, well developed, in no acute distress. HEENT: normal. Neck: Supple, no JVD, carotid bruits, or masses. Cardiac: RRR, no murmurs, rubs, or gallops. No clubbing, cyanosis, edema.  Radials/DP/PT  2+ and equal bilaterally.  Respiratory:  Respirations regular and unlabored, clear to auscultation bilaterally. GI: Soft, nontender, nondistended, BS + x 4. MS: no deformity or atrophy. Skin: warm  and dry, no rash. Neuro:  Strength and sensation are intact. Psych: Normal affect.      Lab Results  Component Value Date   WBC 8.0 04/16/2023   HGB 16.2 04/16/2023   HCT 47.4 04/16/2023   MCV 84 04/16/2023   PLT 281 04/16/2023   Lab Results  Component Value Date   CREATININE 0.79 04/16/2023   BUN 15 04/16/2023   NA 141 04/16/2023   K 3.9 04/16/2023   CL 105 04/16/2023   CO2 24 04/16/2023   Lab Results  Component Value Date   ALT 25 10/01/2022   AST 16 10/01/2022   ALKPHOS 132 (H) 10/01/2022   BILITOT <0.2 10/01/2022   Lab Results  Component Value Date   CHOL 265 (H) 10/01/2022   HDL 42 10/01/2022   LDLCALC 192 (H) 10/01/2022   TRIG 164 (H) 10/01/2022   CHOLHDL 6.3 (H) 10/01/2022    Lab Results  Component Value Date   HGBA1C 8.4 (H) 10/01/2022     Review of Prior Studies    [facility] 04/20/2023 Ost LAD to Prox LAD lesion is 50% stenosed => no change from recent cath; Prox LAD stent is widely patent   Dist LAD lesion is 95% stenosed.->  Stable from previous cath   2nd Diag lesion is 85% stenosed.   Mid Cx to Dist Cx stent is widely patent, with 0% stenosed side branch in 2nd Mrg; LPDA stent is widely patent   2nd Mrg-1 lesion is 40% stenosed.  2nd Mrg-2 stent is patent   LPAV lesion is 50% stenosed.   Prox RCA stent is 10% stenosed.   The left ventricular systolic function is normal.  The left ventricular ejection fraction is 50-55% by visual estimate.   LV end diastolic pressure is normal.   In the absence of any other complications or medical issues, we expect the patient to be ready for discharge from a cath perspective on 04/20/2023.   Recommend to resume Apixaban, at currently prescribed dose and frequency on 04/20/2023.   Recommend concurrent antiplatelet therapy of Clopidogrel 75mg  daily.   Continue home regiment Plavix plus Eliquis     Essentially stable coronary arteries from previous catheterization. No obvious culprit lesions besides the downstream LAD  disease and jailed diagonal branches.  Stable LV function with mild global hypokinesis but EF of roughly 50 to 55%.   RECOMMENDATIONS: Continue medical management for small vessel CAD. Okay to restart Eliquis tonight.  Assessment & Plan   1.  ***     {Are you ordering a CV Procedure (e.g. stress test, cath, DCCV, TEE, etc)?   Press F2        :[id number]}   Signed, Bettey Mare. Liborio Nixon, ANP, AACC   05/17/2023 3:14 PM      Office [phone] Fax [phone]  Notice: This dictation was prepared with Dragon dictation along with smaller phrase technology. Any transcriptional errors that result from this process are unintentional and may not be corrected upon review.

## 2023-05-19 ENCOUNTER — Ambulatory Visit: Payer: Medicare Other | Attending: Adult Health | Admitting: Adult Health

## 2023-06-11 ENCOUNTER — Encounter: Payer: Self-pay | Admitting: Interventional Cardiology

## 2023-06-12 ENCOUNTER — Telehealth: Payer: Self-pay | Admitting: Interventional Cardiology

## 2023-06-12 NOTE — Telephone Encounter (Signed)
Pt is requesting a callback after stating he was returning nurse Shamea's call. Please advise

## 2023-06-12 NOTE — Telephone Encounter (Signed)
Called pt to f/u BP reading.  Voice mail full unable to leave a message.

## 2023-06-12 NOTE — Telephone Encounter (Signed)
Called pt reports yesterday BP 78/40 felt like head was full of helium.  Felt lightheaded. Took BP X2 both were in 70's. BP today 126/78-72 reports feels okay.  Advised pt if BP is that low again to call 911 for evaluation.  Also advised if BP is slightly low to increase fluid intake and eat a small salty snack to help increase BP. Reviewed heart medications reports takes all as prescribed.  Per pt drinks about 2L fluid daily. Reports the following BP readings:  8/1-108/68-69 8/2-142/78-72 8/3-128/65-70 8/5-165-72-68 8/6-156/78-73 Advised will send message to primary RN.

## 2023-06-12 NOTE — Telephone Encounter (Signed)
I spoke with patient and gave him message from Dr Varanasi 

## 2023-06-12 NOTE — Telephone Encounter (Signed)
See phone note from today.

## 2023-06-12 NOTE — Telephone Encounter (Signed)
Would continue to monitor.

## 2023-06-17 DIAGNOSIS — G720 Drug-induced myopathy: Secondary | ICD-10-CM | POA: Insufficient documentation

## 2023-06-30 ENCOUNTER — Other Ambulatory Visit: Payer: Self-pay | Admitting: *Deleted

## 2023-06-30 DIAGNOSIS — I10 Essential (primary) hypertension: Secondary | ICD-10-CM

## 2023-06-30 MED ORDER — AMLODIPINE BESYLATE 10 MG PO TABS
10.0000 mg | ORAL_TABLET | Freq: Every day | ORAL | 3 refills | Status: AC
Start: 2023-06-30 — End: ?

## 2023-09-06 ENCOUNTER — Other Ambulatory Visit: Payer: Self-pay | Admitting: Cardiology

## 2023-09-06 DIAGNOSIS — E7800 Pure hypercholesterolemia, unspecified: Secondary | ICD-10-CM

## 2023-09-06 DIAGNOSIS — I25118 Atherosclerotic heart disease of native coronary artery with other forms of angina pectoris: Secondary | ICD-10-CM

## 2023-09-08 ENCOUNTER — Other Ambulatory Visit: Payer: Self-pay | Admitting: Interventional Cardiology

## 2023-09-08 DIAGNOSIS — I5042 Chronic combined systolic (congestive) and diastolic (congestive) heart failure: Secondary | ICD-10-CM

## 2023-09-08 DIAGNOSIS — I10 Essential (primary) hypertension: Secondary | ICD-10-CM

## 2023-09-21 DIAGNOSIS — E1165 Type 2 diabetes mellitus with hyperglycemia: Secondary | ICD-10-CM | POA: Diagnosis not present

## 2023-09-21 DIAGNOSIS — I10 Essential (primary) hypertension: Secondary | ICD-10-CM | POA: Diagnosis not present

## 2023-09-21 DIAGNOSIS — Z23 Encounter for immunization: Secondary | ICD-10-CM | POA: Diagnosis not present

## 2023-10-06 ENCOUNTER — Ambulatory Visit: Payer: Medicare Other | Admitting: Interventional Cardiology

## 2023-10-09 ENCOUNTER — Other Ambulatory Visit: Payer: Self-pay | Admitting: Internal Medicine

## 2023-10-13 NOTE — Telephone Encounter (Signed)
30 day refill sent.

## 2023-10-25 ENCOUNTER — Other Ambulatory Visit: Payer: Self-pay | Admitting: Interventional Cardiology

## 2023-10-26 NOTE — Telephone Encounter (Signed)
Prescription refill request for Eliquis received. Indication:afib Last office visit:6/24 Scr:0.79  6/24 Age: 57 Weight:101.6  kg  Prescription refilled

## 2023-11-03 ENCOUNTER — Other Ambulatory Visit (HOSPITAL_COMMUNITY): Payer: Self-pay

## 2023-11-03 ENCOUNTER — Telehealth: Payer: Self-pay

## 2023-11-03 NOTE — Telephone Encounter (Signed)
 Pharmacy Patient Advocate Encounter   Received notification from CoverMyMeds that prior authorization for PRALUENT  is required/requested.   Insurance verification completed.   The patient is insured through [facility] .   Per test claim: PA required; PA submitted to above mentioned insurance via CoverMyMeds Key/confirmation #/EOC [id number] Status is pending

## 2023-11-03 NOTE — Telephone Encounter (Signed)
Pharmacy Patient Advocate Encounter  Received notification from [facility] that Prior Authorization for PRALUENT has been APPROVED from 11/03/23 to 11/02/24

## 2023-11-08 ENCOUNTER — Other Ambulatory Visit: Payer: Self-pay | Admitting: Internal Medicine

## 2023-11-20 ENCOUNTER — Ambulatory Visit: Payer: Medicare Other | Attending: Interventional Cardiology | Admitting: Cardiology

## 2023-12-07 ENCOUNTER — Other Ambulatory Visit: Payer: Self-pay | Admitting: Internal Medicine

## 2023-12-07 DIAGNOSIS — I5042 Chronic combined systolic (congestive) and diastolic (congestive) heart failure: Secondary | ICD-10-CM

## 2023-12-07 DIAGNOSIS — I10 Essential (primary) hypertension: Secondary | ICD-10-CM

## 2023-12-10 ENCOUNTER — Other Ambulatory Visit: Payer: Self-pay | Admitting: Interventional Cardiology

## 2023-12-18 ENCOUNTER — Ambulatory Visit: Payer: Medicare Other | Admitting: Pulmonary Disease

## 2024-01-15 ENCOUNTER — Encounter: Payer: Self-pay | Admitting: Student

## 2024-01-15 ENCOUNTER — Ambulatory Visit: Payer: Medicare Other | Attending: Student | Admitting: Student

## 2024-01-15 VITALS — BP 132/100 | HR 77 | Ht 69.0 in | Wt 213.2 lb

## 2024-01-15 DIAGNOSIS — I493 Ventricular premature depolarization: Secondary | ICD-10-CM

## 2024-01-15 DIAGNOSIS — I480 Paroxysmal atrial fibrillation: Secondary | ICD-10-CM

## 2024-01-15 DIAGNOSIS — I25118 Atherosclerotic heart disease of native coronary artery with other forms of angina pectoris: Secondary | ICD-10-CM | POA: Diagnosis not present

## 2024-01-15 MED ORDER — METOPROLOL SUCCINATE ER 100 MG PO TB24
100.0000 mg | ORAL_TABLET | Freq: Two times a day (BID) | ORAL | 3 refills | Status: AC
Start: 2024-01-15 — End: 2024-09-13

## 2024-01-15 NOTE — Progress Notes (Signed)
  Electrophysiology Office Note:   Date:  01/15/2024  ID:  TIDUS UPCHURCH, DOB Apr 01, 1966, MRN [id number]  Primary Cardiologist: Lance Muss, MD Electrophysiologist: Will Jorja Loa, MD      History of Present Illness:   Danny Kelley is a 58 y.o. male with h/o PVCs, PAF, HTN, CAD, HFrecEF, and HLD seen today for routine electrophysiology followup.   Since last being seen in our clinic the patient reports doing well overall. He has had some mild palpitations a few times a week. Walking 6 blocks with only mild SOB. Otherwise, he denies chest pain, PND, orthopnea, nausea, vomiting, dizziness, syncope, weight gain, or early satiety. Occasional mild edema at the end of the day.   Review of systems complete and found to be negative unless listed in HPI.   EP Information / Studies Reviewed:    EKG is ordered today. Personal review as below.  EKG Interpretation Date/Time:  Friday January 15 2024 11:28:38 EDT Ventricular Rate:  77 PR Interval:  134 QRS Duration:  100 QT Interval:  420 QTC Calculation: 475 R Axis:   49  Text Interpretation: Sinus rhythm with occasional Premature ventricular complexes and Premature atrial complexes Confirmed by Maxine Glenn [phone]) on 01/15/2024 11:33:48 AM    Arrhythmia/Device History No specialty comments available.   Physical Exam:   VS:  BP (!) 132/100   Pulse 77   Ht 5\' 9"  (1.753 m)   Wt 213 lb 3.2 oz (96.7 kg)   SpO2 95%   BMI 31.48 kg/m    Wt Readings from Last 3 Encounters:  01/15/24 213 lb 3.2 oz (96.7 kg)  04/20/23 224 lb (101.6 kg)  04/16/23 224 lb 3.2 oz (101.7 kg)     GEN: No acute distress NECK: No JVD; No carotid bruits CARDIAC: Regular rate and rhythm, no murmurs, rubs, gallops RESPIRATORY:  Clear to auscultation without rales, wheezing or rhonchi  ABDOMEN: Soft, non-tender, non-distended EXTREMITIES:  No edema; No deformity   ASSESSMENT AND PLAN:    Persistent AF EKG today shows NSR Continue eliquis 5 mg BID  for CHA2DS2VASc of at least 5  Labs today.   Palpitations PVCs Monitor 01/2023 with 15% PVCs, started on mexitil but did not tolerate Change lopressor to Toprol 100 mg BID  CAD Stable disease cath 04/2023 Continue medical management  Aortic enlargement Pending repeat Echo next month.   Follow up with Dr. Elberta Fortis in 12 months  Signed, Graciella Freer, PA-C

## 2024-01-15 NOTE — Patient Instructions (Signed)
 Medication Instructions:  STOP metoprolol tartrate (Lopressor) START metoprolol succinate (Toprol XL) 100 mg twice daily *If you need a refill on your cardiac medications before your next appointment, please call your pharmacy*  Lab Work: BMET, CBC-TODAY If you have labs (blood work) drawn today and your tests are completely normal, you will receive your results only by: MyChart Message (if you have MyChart) OR A paper copy in the mail If you have any lab test that is abnormal or we need to change your treatment, we will call you to review the results.  Follow-Up: At [facility], you and your health needs are our priority.  As part of our continuing mission to provide you with exceptional heart care, we have created designated Provider Care Teams.  These Care Teams include your primary Cardiologist (physician) and Advanced Practice Providers (APPs -  Physician Assistants and Nurse Practitioners) who all work together to provide you with the care you need, when you need it.  Your next appointment:   1 year(s)  Provider:   Loman Brooklyn, MD

## 2024-02-05 ENCOUNTER — Encounter: Payer: Self-pay | Admitting: Cardiology

## 2024-02-09 ENCOUNTER — Encounter: Payer: Self-pay | Admitting: Cardiology

## 2024-02-24 ENCOUNTER — Telehealth: Payer: Self-pay | Admitting: Student

## 2024-02-24 NOTE — Telephone Encounter (Signed)
 Spoke to patient's wife and confirmed the message below. Reports that pt has been experiencing worsening crushing chest pressure. Admits that the patient has taken 7 nitroglycerin  over the last 2 weeks. Admits that pt has been having nausea, vomiting and shortness of breath as well. She admits that the last time patient felt like this that he had to have a heart cath.  Wife also states that patient symptoms occur generally early in the morning. Pt's wife was advised for patient to go to the ER for evaluation. Wife reports that patient is probably not going to want to go. Advised again that pt needs to go to the ER for chest pain evaluation. Wife reports that she will try to convenience the patient to go.

## 2024-02-24 NOTE — Telephone Encounter (Signed)
 Contacted patient's wife and they have not went to the ER. She states that he does not want to go to ER, but promised wife that if symptoms occur again that he will go to the ER. Looks like your 8:20 slot has been taken.

## 2024-02-24 NOTE — Telephone Encounter (Signed)
 Patient has been added in.

## 2024-02-24 NOTE — Telephone Encounter (Signed)
   Pt c/o of Chest Pain: STAT if active CP, including tightness, pressure, jaw pain, radiating pain to shoulder/upper arm/back, CP unrelieved by Nitro. Symptoms reported of SOB, nausea, vomiting, sweating.  1. Are you having CP right now? Pressure in his chest - not at this time   2. Are you experiencing any other symptoms (ex. SOB, nausea, vomiting, sweating)?  Get nauseated, lightheaded and anxious when this happen- no symptoms at this time   3. Is your CP continuous or coming and going? Stays until he takes Nitroglycerin    4. Have you taken Nitroglycerin ?  7 times in  the last 2 1/2 weeks   5. How long have you been experiencing CP? 2 1/2 weeks - patient wants to be seen   6. If NO CP at time of call then end call with telling Pt to call back or call 911 if Chest pain returns prior to return call from triage team.

## 2024-02-25 ENCOUNTER — Ambulatory Visit: Attending: Cardiology | Admitting: Cardiology

## 2024-02-25 ENCOUNTER — Encounter: Payer: Self-pay | Admitting: Cardiology

## 2024-02-25 ENCOUNTER — Encounter: Payer: Self-pay | Admitting: *Deleted

## 2024-02-25 VITALS — BP 130/80 | HR 91 | Ht 69.0 in | Wt 212.0 lb

## 2024-02-25 DIAGNOSIS — I493 Ventricular premature depolarization: Secondary | ICD-10-CM | POA: Diagnosis not present

## 2024-02-25 DIAGNOSIS — I25118 Atherosclerotic heart disease of native coronary artery with other forms of angina pectoris: Secondary | ICD-10-CM

## 2024-02-25 LAB — CBC

## 2024-02-25 MED ORDER — NITROGLYCERIN 0.4 MG SL SUBL: 0.4000 mg | SUBLINGUAL_TABLET | SUBLINGUAL | 11 refills | Status: AC | PRN

## 2024-02-25 MED ORDER — ISOSORBIDE MONONITRATE ER 120 MG PO TB24: 120.0000 mg | ORAL_TABLET | Freq: Every day | ORAL | 3 refills | Status: AC

## 2024-02-25 NOTE — Patient Instructions (Signed)
 Medication Instructions:  Please increase your Isosorbide  to 120 mg a day. Continue all other medications as listed.  *If you need a refill on your cardiac medications before your next appointment, please call your pharmacy*  Lab Work:  If you have labs (blood work) drawn today and your tests are completely normal, you will receive your results only by: MyChart Message (if you have MyChart) OR A paper copy in the mail If you have any lab test that is abnormal or we need to change your treatment, we will call you to review the results.  Testing/Procedures:   Follow-Up: At [facility] you and your health needs are our priority.  As part of our continuing mission to provide you with exceptional heart care, our providers are all part of one team.  This team includes your primary Cardiologist (physician) and Advanced Practice Providers or APPs (Physician Assistants and Nurse Practitioners) who all work together to provide you with the care you need, when you need it.  Your next appointment:   3 month(s)  Provider:   One of our Advanced Practice Providers (APPs): Melita Springer, PA-C  Friddie Jetty, NP Lawana Pray, NP  Theotis Flake, PA-C Lovette Rud, PA-C  [place], PA-C Charles Connor, NP  Ervin Heath, PA-C [place], PA-C  Marlana Silvan, NP    Dayna Dunn, PA-C  Taylor Parcells, PA-C Madison Fountain, NP Marlyse Single, PA-C Callie Goodrich, PA-C  Katlyn West, NP Sheng Haley, PA-C  Evan Williams, PA-C Kathleen Johnson, PA-C Xika Zhao, NP      We recommend signing up for the patient portal called "MyChart".  Sign up information is provided on this After Visit Summary.  MyChart is used to connect with patients for Virtual Visits (Telemedicine).  Patients are able to view lab/test results, encounter notes, upcoming appointments, etc.  Non-urgent messages can be sent to your provider as well.   To learn more about what you can do with MyChart, go to [email or web address].       1st Floor: - Lobby - Registration  - Pharmacy  - Lab - Cafe  2nd Floor: - PV Lab - Diagnostic Testing (echo, CT, nuclear med)  3rd Floor: - Vacant  4th Floor: - TCTS (cardiothoracic surgery) - AFib Clinic - Structural Heart Clinic - Vascular Surgery  - Vascular Ultrasound  5th Floor: - HeartCare Cardiology (general and EP) - Clinical Pharmacy for coumadin, hypertension, lipid, weight-loss medications, and med management appointments    Valet parking services will be available as well.

## 2024-02-25 NOTE — Addendum Note (Signed)
 Addended by: Hugh Madura on: 02/25/2024 05:08 PM   Modules accepted: Orders

## 2024-02-25 NOTE — Progress Notes (Signed)
 Cardiology Office Note:  .   Date:  02/25/2024  ID:  Danny Kelley, DOB November 26, 1965, MRN [id number] PCP: No primary care provider on file.  [facility] Providers Cardiologist:  Dorothye Gathers, MD Electrophysiologist:  Will Cortland Ding, MD     History of Present Illness: .   Danny Kelley is a 58 y.o. male Discussed the use of AI scribe software for clinical note transcription with the patient, who gave verbal consent to proceed.  History of Present Illness Danny Kelley is a 58 year old male with coronary artery disease who presents with accelerating chest discomfort.  He has experienced escalating chest discomfort over the past two weeks, necessitating increased use of nitroglycerin . He has used nitroglycerin  for seven or eight days, often requiring two doses. He describes the discomfort as a pressure sensation, likening it to 'somebody parked the house' on his chest. The pain does not radiate to the arm or shoulder, which he notes is atypical for him as he usually experiences 'male symptoms' of heart issues. No arm or shoulder pain is associated with the chest discomfort.  He has a history of coronary artery disease with multiple stents placed in the circumflex artery in 2014 and 2019, and the right coronary artery in 2022. His last cardiac catheterization in June 2024 showed stable stents with a minor narrowing in the right coronary stent and an 85% narrowing in a side branch of the LAD, which was previously ballooned but not stented due to its small size.  He has persistent atrial fibrillation but is currently in normal sinus rhythm with PACs. He has undergone multiple cardioversions and two atrial fibrillation ablations in 2018 and 2021. He is on Eliquis  5 mg twice daily with a CHADS-VASc score of at least five. He was previously on Lopressor  but was switched to Toprol  100 mg twice daily to manage a 15% PVC burden. He did not tolerate mexiletine in the past.  He has a history of a  TIA in February 2018. His left heart catheterization in 2023 showed two diagonals that were jailed. He is statin intolerant but is taking Praluent , with a prior LDL of 129 in April 2024.  He has a history of diabetes, which is well-controlled with an A1c of 5.1, down from 8.2. He is on Ozempic and has made significant lifestyle changes to manage his condition.  He continues to smoke, though he has reduced his intake to about half a pack a day from one and a half packs. His smoking fluctuates with stress levels.  His family history is significant for cardiovascular disease, with both parents and grandparents having similar issues. He is motivated to manage his health to see his four grandchildren grow up.      ROS: No bleeding, no syncope  Studies Reviewed: Aaron Aas   EKG Interpretation Date/Time:  Thursday February 25 2024 08:35:16 EDT Ventricular Rate:  91 PR Interval:  146 QRS Duration:  96 QT Interval:  378 QTC Calculation: 464 R Axis:   75  Text Interpretation: Sinus rhythm with Premature supraventricular complexes ST & T wave abnormality, consider inferior ischemia When compared with ECG of 15-Jan-2024 11:28, Premature ventricular complexes are no longer Present T wave inversion now evident in Inferior leads Confirmed by Dorothye Gathers ([id number]) on 02/25/2024 8:41:54 AM    Results LABS LDL: 129 mg/dL (19/1478) GNF6O: 1.3%  RADIOLOGY Aortic root measurement: 41 mm  DIAGNOSTIC Cardiac catheterization: LAD stent patent, circumflex stents patent, right coronary stent with 10% narrowing,  side branch of LAD with 85% narrowing (04/20/2023) EKG: Normal sinus rhythm with PACs Echocardiogram: Ejection fraction 55-60%, mild diastolic dysfunction Risk Assessment/Calculations:            Physical Exam:   VS:  BP 130/80   Pulse 91   Ht 5\' 9"  (1.753 m)   Wt 212 lb (96.2 kg)   SpO2 98%   BMI 31.31 kg/m    Wt Readings from Last 3 Encounters:  02/25/24 212 lb (96.2 kg)  01/15/24 213 lb 3.2 oz  (96.7 kg)  04/20/23 224 lb (101.6 kg)    GEN: Well nourished, well developed in no acute distress NECK: No JVD; No carotid bruits CARDIAC: RRR, no murmurs, no rubs, no gallops RESPIRATORY:  Clear to auscultation without rales, wheezing or rhonchi  ABDOMEN: Soft, non-tender, non-distended EXTREMITIES:  No edema; No deformity   ASSESSMENT AND PLAN: .    Assessment and Plan Assessment & Plan Coronary artery disease with angina Coronary artery disease with escalating angina symptoms and increased nitroglycerin  use, suggesting potential changes in coronary anatomy. Previous cardiac catheterization in June 2024 showed stable LAD stent, open circumflex stents, mild narrowing in the right coronary stent, and 85% narrowing in a side branch of the LAD, previously ballooned but not stented due to anatomical constraints. Current symptoms suggest possible progression of disease or changes in the side branch. Risks of angiogram include less than 1 in 1000 chance of stroke, myocardial infarction, or death. Potential need to address the jailed side branch if significant changes are observed. - Increase isosorbide  mononitrate to 120 mg daily. - Schedule cardiac catheterization to assess for changes in coronary anatomy. - Hold Eliquis  for two days prior to the procedure. - Son assisted with history   Persistent atrial fibrillation Persistent atrial fibrillation with history of multiple ablations and cardioversions. Currently in normal sinus rhythm with PACs on EKG. Continues on Eliquis  for anticoagulation with a CHADS-VASc score of at least five. Previous medications like Multaq  and sotalol  were ineffective.  Transient ischemic attack (TIA) TIA in February 2018. Continues on anticoagulation therapy with Eliquis  to reduce the risk of stroke.  Diabetes mellitus, type 2, well-controlled Type 2 diabetes mellitus, well-controlled with an A1c of 5.1. Previously elevated A1c reduced significantly with lifestyle  changes and initiation of Ozempic.  Tobacco use disorder Tobacco use disorder with current smoking reduced to half a pack per day from one and a half packs. Acknowledges stress-related fluctuations in smoking habits. - Encourage continued reduction in smoking.       Informed Consent   Shared Decision Making/Informed Consent The risks [stroke (1 in 1000), death (1 in 1000), kidney failure [usually temporary] (1 in 500), bleeding (1 in 200), allergic reaction [possibly serious] (1 in 200)], benefits (diagnostic support and management of coronary artery disease) and alternatives of a cardiac catheterization were discussed in detail with Mr. Bellizzi and he is willing to proceed.     Dispo: 3 months APP  Signed, Dorothye Gathers, MD

## 2024-02-25 NOTE — Telephone Encounter (Signed)
 Pt currently in office for evaluation

## 2024-02-25 NOTE — H&P (View-Only) (Signed)
 Cardiology Office Note:  .   Date:  02/25/2024  ID:  Danny Kelley, DOB November 26, 1965, MRN [id number] PCP: No primary care provider on file.  [facility] Prov[facility] Cardiologist:  Dorothye Gathers, MD Electrophysiologist:  Will Cortland Ding, MD     History of Present Illness: .   Danny Kelley is a 58 y.o. male Discussed the use of AI scribe software for clinical note transcription with the patient, who gave verbal consent to proceed.  History of Present Illness Danny Kelley is a 58 year old male with coronary artery disease who presents with accelerating chest discomfort.  He has experienced escalating chest discomfort over the past two weeks, necessitating increased use of nitroglycerin . He has used nitroglycerin  for seven or eight days, often requiring two doses. He describes the discomfort as a pressure sensation, likening it to 'somebody parked the house' on his chest. The pain does not radiate to the arm or shoulder, which he notes is atypical for him as he usually experiences 'male symptoms' of heart issues. No arm or shoulder pain is associated with the chest discomfort.  He has a history of coronary artery disease with multiple stents placed in the circumflex artery in 2014 and 2019, and the right coronary artery in 2022. His last cardiac catheterization in June 2024 showed stable stents with a minor narrowing in the right coronary stent and an 85% narrowing in a side branch of the LAD, which was previously ballooned but not stented due to its small size.  He has persistent atrial fibrillation but is currently in normal sinus rhythm with PACs. He has undergone multiple cardioversions and two atrial fibrillation ablations in 2018 and 2021. He is on Eliquis  5 mg twice daily with a CHADS-VASc score of at least five. He was previously on Lopressor  but was switched to Toprol  100 mg twice daily to manage a 15% PVC burden. He did not tolerate mexiletine in the past.  He has a history of a  TIA in February 2018. His left heart catheterization in 2023 showed two diagonals that were jailed. He is statin intolerant but is taking Praluent , with a prior LDL of 129 in April 2024.  He has a history of diabetes, which is well-controlled with an A1c of 5.1, down from 8.2. He is on Ozempic and has made significant lifestyle changes to manage his condition.  He continues to smoke, though he has reduced his intake to about half a pack a day from one and a half packs. His smoking fluctuates with stress levels.  His family history is significant for cardiovascular disease, with both parents and grandparents having similar issues. He is motivated to manage his health to see his four grandchildren grow up.      ROS: No bleeding, no syncope  Studies Reviewed: Aaron Aas   EKG Interpretation Date/Time:  Thursday February 25 2024 08:35:16 EDT Ventricular Rate:  91 PR Interval:  146 QRS Duration:  96 QT Interval:  378 QTC Calculation: 464 R Axis:   75  Text Interpretation: Sinus rhythm with Premature supraventricular complexes ST & T wave abnormality, consider inferior ischemia When compared with ECG of 15-Jan-2024 11:28, Premature ventricular complexes are no longer Present T wave inversion now evident in Inferior leads Confirmed by Dorothye Gathers ([id number]) on 02/25/2024 8:41:54 AM    Results LABS LDL: 129 mg/dL (19/1478) GNF6O: 1.3%  RADIOLOGY Aortic root measurement: 41 mm  DIAGNOSTIC Cardiac catheterization: LAD stent patent, circumflex stents patent, right coronary stent with 10% narrowing,  side branch of LAD with 85% narrowing (04/20/2023) EKG: Normal sinus rhythm with PACs Echocardiogram: Ejection fraction 55-60%, mild diastolic dysfunction Risk Assessment/Calculations:            Physical Exam:   VS:  BP 130/80   Pulse 91   Ht 5\' 9"  (1.753 m)   Wt 212 lb (96.2 kg)   SpO2 98%   BMI 31.31 kg/m    Wt Readings from Last 3 Encounters:  02/25/24 212 lb (96.2 kg)  01/15/24 213 lb 3.2 oz  (96.7 kg)  04/20/23 224 lb (101.6 kg)    GEN: Well nourished, well developed in no acute distress NECK: No JVD; No carotid bruits CARDIAC: RRR, no murmurs, no rubs, no gallops RESPIRATORY:  Clear to auscultation without rales, wheezing or rhonchi  ABDOMEN: Soft, non-tender, non-distended EXTREMITIES:  No edema; No deformity   ASSESSMENT AND PLAN: .    Assessment and Plan Assessment & Plan Coronary artery disease with angina Coronary artery disease with escalating angina symptoms and increased nitroglycerin  use, suggesting potential changes in coronary anatomy. Previous cardiac catheterization in June 2024 showed stable LAD stent, open circumflex stents, mild narrowing in the right coronary stent, and 85% narrowing in a side branch of the LAD, previously ballooned but not stented due to anatomical constraints. Current symptoms suggest possible progression of disease or changes in the side branch. Risks of angiogram include less than 1 in 1000 chance of stroke, myocardial infarction, or death. Potential need to address the jailed side branch if significant changes are observed. - Increase isosorbide  mononitrate to 120 mg daily. - Schedule cardiac catheterization to assess for changes in coronary anatomy. - Hold Eliquis  for two days prior to the procedure. - Son assisted with history   Persistent atrial fibrillation Persistent atrial fibrillation with history of multiple ablations and cardioversions. Currently in normal sinus rhythm with PACs on EKG. Continues on Eliquis  for anticoagulation with a CHADS-VASc score of at least five. Previous medications like Multaq  and sotalol  were ineffective.  Transient ischemic attack (TIA) TIA in February 2018. Continues on anticoagulation therapy with Eliquis  to reduce the risk of stroke.  Diabetes mellitus, type 2, well-controlled Type 2 diabetes mellitus, well-controlled with an A1c of 5.1. Previously elevated A1c reduced significantly with lifestyle  changes and initiation of Ozempic.  Tobacco use disorder Tobacco use disorder with current smoking reduced to half a pack per day from one and a half packs. Acknowledges stress-related fluctuations in smoking habits. - Encourage continued reduction in smoking.       Informed Consent   Shared Decision Making/Informed Consent The risks [stroke (1 in 1000), death (1 in 1000), kidney failure [usually temporary] (1 in 500), bleeding (1 in 200), allergic reaction [possibly serious] (1 in 200)], benefits (diagnostic support and management of coronary artery disease) and alternatives of a cardiac catheterization were discussed in detail with Mr. Bellizzi and he is willing to proceed.     Dispo: 3 months APP  Signed, Dorothye Gathers, MD

## 2024-02-26 LAB — BASIC METABOLIC PANEL WITH GFR
BUN/Creatinine Ratio: 20 (ref 9–20)
BUN: 18 mg/dL (ref 6–24)
CO2: 22 mmol/L (ref 20–29)
Calcium: 9.3 mg/dL (ref 8.7–10.2)
Chloride: 103 mmol/L (ref 96–106)
Creatinine, Ser: 0.89 mg/dL (ref 0.76–1.27)
Glucose: 97 mg/dL (ref 70–99)
Potassium: 4.1 mmol/L (ref 3.5–5.2)
Sodium: 139 mmol/L (ref 134–144)
eGFR: 101 mL/min/{1.73_m2} (ref >59)

## 2024-02-26 LAB — CBC
Hematocrit: 47.7 % (ref 37.5–51.0)
Hemoglobin: 16.3 g/dL (ref 13.0–17.7)
MCH: 29.1 pg (ref 26.6–33.0)
MCHC: 34.2 g/dL (ref 31.5–35.7)
MCV: 85 fL (ref 79–97)
Platelets: 284 10*3/uL (ref 150–450)
RBC: 5.61 x10E6/uL (ref 4.14–5.80)
RDW: 13.8 % (ref 11.6–15.4)
WBC: 14 10*3/uL — ABNORMAL HIGH (ref 3.4–10.8)

## 2024-02-29 ENCOUNTER — Telehealth: Payer: Self-pay | Admitting: *Deleted

## 2024-02-29 NOTE — Telephone Encounter (Addendum)
 Cardiac Catheterization scheduled at [facility] for: Tuesday March 01, 2024 7:30 AM Arrival time [facility] Main Entrance A at: 5:30 AM  Nothing to eat after midnight prior to procedure, clear liquids until 5 AM day of procedure.  Medication instructions: -Hold:  Eliquis -none 02/28/24 until post procedure  Hydrochlorothiazide /Spironolactone -AM of procedure -Other usual morning medications can be taken with sips of water including aspirin  81 mg and Plavix  75 mg.   Ozempic weekly on Fridays-instructions given pt state he will hold Friday 4/25. Plan to go home the same day, you will only stay overnight if medically necessary.  You must have responsible adult to drive you home.  Someone must be with you the first 24 hours after you arrive home.  Left message for patient to call back to review procedure instructions.  Left detailed message (DPR) with procedure instructions at phone number listed.

## 2024-03-01 ENCOUNTER — Ambulatory Visit (HOSPITAL_COMMUNITY)
Admission: RE | Admit: 2024-03-01 | Discharge: 2024-03-01 | Disposition: A | Discharge status: Home / Self Care | Attending: Cardiology | Admitting: Cardiology

## 2024-03-01 ENCOUNTER — Other Ambulatory Visit: Payer: Self-pay

## 2024-03-01 ENCOUNTER — Encounter (HOSPITAL_COMMUNITY)
Admission: RE | Disposition: A | Discharge status: Home / Self Care | Payer: Self-pay | Source: Home / Self Care | Attending: Cardiology

## 2024-03-01 ENCOUNTER — Encounter: Payer: Self-pay | Admitting: Cardiology

## 2024-03-01 DIAGNOSIS — Z7985 Long-term (current) use of injectable non-insulin antidiabetic drugs: Secondary | ICD-10-CM | POA: Insufficient documentation

## 2024-03-01 DIAGNOSIS — Z8673 Personal history of transient ischemic attack (TIA), and cerebral infarction without residual deficits: Secondary | ICD-10-CM | POA: Diagnosis not present

## 2024-03-01 DIAGNOSIS — I4819 Other persistent atrial fibrillation: Secondary | ICD-10-CM | POA: Diagnosis not present

## 2024-03-01 DIAGNOSIS — I209 Angina pectoris, unspecified: Secondary | ICD-10-CM

## 2024-03-01 DIAGNOSIS — Z7901 Long term (current) use of anticoagulants: Secondary | ICD-10-CM | POA: Diagnosis not present

## 2024-03-01 DIAGNOSIS — Z955 Presence of coronary angioplasty implant and graft: Secondary | ICD-10-CM | POA: Insufficient documentation

## 2024-03-01 DIAGNOSIS — E119 Type 2 diabetes mellitus without complications: Secondary | ICD-10-CM | POA: Insufficient documentation

## 2024-03-01 DIAGNOSIS — Z8249 Family history of ischemic heart disease and other diseases of the circulatory system: Secondary | ICD-10-CM | POA: Insufficient documentation

## 2024-03-01 DIAGNOSIS — Z79899 Other long term (current) drug therapy: Secondary | ICD-10-CM | POA: Insufficient documentation

## 2024-03-01 DIAGNOSIS — I25119 Atherosclerotic heart disease of native coronary artery with unspecified angina pectoris: Secondary | ICD-10-CM | POA: Diagnosis not present

## 2024-03-01 DIAGNOSIS — I2510 Atherosclerotic heart disease of native coronary artery without angina pectoris: Secondary | ICD-10-CM

## 2024-03-01 DIAGNOSIS — I25111 Atherosclerotic heart disease of native coronary artery with angina pectoris with documented spasm: Secondary | ICD-10-CM

## 2024-03-01 DIAGNOSIS — F17210 Nicotine dependence, cigarettes, uncomplicated: Secondary | ICD-10-CM | POA: Diagnosis not present

## 2024-03-01 DIAGNOSIS — I493 Ventricular premature depolarization: Secondary | ICD-10-CM

## 2024-03-01 DIAGNOSIS — I25118 Atherosclerotic heart disease of native coronary artery with other forms of angina pectoris: Secondary | ICD-10-CM

## 2024-03-01 LAB — GLUCOSE, CAPILLARY: Glucose-Capillary: 120 mg/dL — ABNORMAL HIGH (ref 70–99)

## 2024-03-01 SURGERY — LEFT HEART CATH AND CORONARY ANGIOGRAPHY
Anesthesia: LOCAL

## 2024-03-01 MED ORDER — ONDANSETRON HCL 4 MG/2ML IJ SOLN: 4.0000 mg | Freq: Four times a day (QID) | INTRAMUSCULAR | Status: DC | PRN

## 2024-03-01 MED ORDER — ACETAMINOPHEN 325 MG PO TABS: 650.0000 mg | ORAL_TABLET | ORAL | Status: DC | PRN

## 2024-03-01 MED ORDER — SODIUM CHLORIDE 0.9% FLUSH: 3.0000 mL | INTRAVENOUS | Status: DC | PRN

## 2024-03-01 MED ORDER — MIDAZOLAM HCL 2 MG/2ML IJ SOLN
INTRAMUSCULAR | Status: DC | PRN
  Administered 2024-03-01: 1 mg via INTRAVENOUS
  Administered 2024-03-01: 2 mg via INTRAVENOUS

## 2024-03-01 MED ORDER — HEPARIN SODIUM (PORCINE) 1000 UNIT/ML IJ SOLN
INTRAMUSCULAR | Status: DC | PRN
  Administered 2024-03-01 (×2): 4000 [IU] via INTRAVENOUS

## 2024-03-01 MED ORDER — NITROGLYCERIN 0.4 MG SL SUBL
SUBLINGUAL_TABLET | SUBLINGUAL | Status: AC
  Filled 2024-03-01: qty 1

## 2024-03-01 MED ORDER — HEPARIN (PORCINE) IN NACL 1000-0.9 UT/500ML-% IV SOLN
INTRAVENOUS | Status: DC | PRN
  Administered 2024-03-01: 1000 mL

## 2024-03-01 MED ORDER — VERAPAMIL HCL 2.5 MG/ML IV SOLN
INTRAVENOUS | Status: AC
  Filled 2024-03-01: qty 2

## 2024-03-01 MED ORDER — ASPIRIN 81 MG PO CHEW: 81.0000 mg | CHEWABLE_TABLET | ORAL | Status: DC

## 2024-03-01 MED ORDER — FENTANYL CITRATE (PF) 100 MCG/2ML IJ SOLN
INTRAMUSCULAR | Status: AC
  Filled 2024-03-01: qty 2

## 2024-03-01 MED ORDER — NITROGLYCERIN 0.4 MG SL SUBL
SUBLINGUAL_TABLET | SUBLINGUAL | Status: DC | PRN
  Administered 2024-03-01: .4 mg via SUBLINGUAL

## 2024-03-01 MED ORDER — MIDAZOLAM HCL 2 MG/2ML IJ SOLN
INTRAMUSCULAR | Status: AC
Start: 2024-03-01 — End: ?
  Filled 2024-03-01: qty 2

## 2024-03-01 MED ORDER — CLOPIDOGREL BISULFATE 300 MG PO TABS
ORAL_TABLET | ORAL | Status: AC
  Filled 2024-03-01: qty 1

## 2024-03-01 MED ORDER — SODIUM CHLORIDE 0.9 % IV SOLN: 250.0000 mL | INTRAVENOUS | Status: DC | PRN

## 2024-03-01 MED ORDER — SODIUM CHLORIDE 0.9% FLUSH: 3.0000 mL | Freq: Two times a day (BID) | INTRAVENOUS | Status: DC

## 2024-03-01 MED ORDER — LIDOCAINE HCL (PF) 1 % IJ SOLN
INTRAMUSCULAR | Status: AC
  Filled 2024-03-01: qty 30

## 2024-03-01 MED ORDER — VERAPAMIL HCL 2.5 MG/ML IV SOLN
INTRAVENOUS | Status: DC | PRN
  Administered 2024-03-01: 10 mL via INTRA_ARTERIAL

## 2024-03-01 MED ORDER — SODIUM CHLORIDE 0.9 % WEIGHT BASED INFUSION: 1.0000 mL/kg/h | INTRAVENOUS | Status: DC

## 2024-03-01 MED ORDER — SODIUM CHLORIDE 0.9 % WEIGHT BASED INFUSION: 3.0000 mL/kg/h | INTRAVENOUS | Status: AC

## 2024-03-01 MED ORDER — IOHEXOL 350 MG/ML SOLN
INTRAVENOUS | Status: DC | PRN
  Administered 2024-03-01: 40 mL

## 2024-03-01 MED ORDER — SODIUM CHLORIDE 0.9 % IV SOLN
INTRAVENOUS | Status: DC | PRN
  Administered 2024-03-01: 10 mL/h via INTRAVENOUS

## 2024-03-01 MED ORDER — FENTANYL CITRATE (PF) 100 MCG/2ML IJ SOLN
INTRAMUSCULAR | Status: DC | PRN
  Administered 2024-03-01: 50 ug via INTRAVENOUS
  Administered 2024-03-01: 25 ug via INTRAVENOUS

## 2024-03-01 MED ORDER — HEPARIN SODIUM (PORCINE) 1000 UNIT/ML IJ SOLN
INTRAMUSCULAR | Status: AC
  Filled 2024-03-01: qty 10

## 2024-03-01 MED ORDER — LIDOCAINE HCL (PF) 1 % IJ SOLN
INTRAMUSCULAR | Status: DC | PRN
  Administered 2024-03-01: 5 mL

## 2024-03-01 MED ORDER — MIDAZOLAM HCL 2 MG/2ML IJ SOLN
INTRAMUSCULAR | Status: AC
  Filled 2024-03-01: qty 2

## 2024-03-01 SURGICAL SUPPLY — 10 items
CATH INFINITI AMBI 6FR TG (CATHETERS) IMPLANT
CATH LAUNCHER 5F JR4 (CATHETERS) IMPLANT
DEVICE RAD COMP TR BAND LRG (VASCULAR PRODUCTS) IMPLANT
GLIDESHEATH SLEND A-KIT 6F 22G (SHEATH) IMPLANT
GUIDEWIRE INQWIRE 1.5J.035X260 (WIRE) IMPLANT
KIT ENCORE 26 ADVANTAGE (KITS) IMPLANT
PACK CARDIAC CATHETERIZATION (CUSTOM PROCEDURE TRAY) ×1 IMPLANT
SET ATX-X65L (MISCELLANEOUS) IMPLANT
SHEATH PROBE COVER 6X72 (BAG) IMPLANT
WIRE RUNTHROUGH .014X180CM (WIRE) IMPLANT

## 2024-03-01 NOTE — Interval H&P Note (Signed)
 History and Physical Interval Note:  03/01/2024 8:42 AM  Danny Kelley  has presented today for surgery, with the diagnosis of chest pain.  The various methods of treatment have been discussed with the patient and family. After consideration of risks, benefits and other options for treatment, the patient has consented to  Procedure(s): LEFT HEART CATH AND CORONARY ANGIOGRAPHY (N/A) and possible coronary angioplasty as a surgical intervention.  The patient's history has been reviewed, patient examined, no change in status, stable for surgery.  I have reviewed the patient's chart and labs.  Questions were answered to the patient's satisfaction.    Patient with class III-IV angina pectoris presently on maximum medical therapy, has known prior coronary interventions.  No noninvasive testing was performed in view of significant symptoms and scheduled for cardiac catheterization.  Patient met and discussed by me personally.  Knox Perl

## 2024-03-01 NOTE — Discharge Instructions (Signed)

## 2024-03-02 ENCOUNTER — Ambulatory Visit (HOSPITAL_COMMUNITY): Attending: Cardiovascular Disease

## 2024-03-02 ENCOUNTER — Encounter (HOSPITAL_COMMUNITY): Payer: Self-pay | Admitting: Cardiology

## 2024-03-02 MED FILL — Clopidogrel Bisulfate Tab 300 MG (Base Equiv): ORAL | Qty: 1 | Status: CN

## 2024-03-02 MED FILL — Clopidogrel Bisulfate Tab 300 MG (Base Equiv): ORAL | Qty: 1 | Status: AC

## 2024-03-07 ENCOUNTER — Ambulatory Visit: Payer: Medicare Other | Admitting: Cardiology

## 2024-03-07 ENCOUNTER — Encounter (HOSPITAL_COMMUNITY): Payer: Self-pay | Admitting: Cardiology

## 2024-04-21 ENCOUNTER — Other Ambulatory Visit: Payer: Self-pay | Admitting: Interventional Cardiology

## 2024-04-21 MED ORDER — APIXABAN 5 MG PO TABS: 5.0000 mg | ORAL_TABLET | Freq: Two times a day (BID) | ORAL | 1 refills | Status: DC

## 2024-04-21 NOTE — Telephone Encounter (Signed)
 Prescription refill request for Eliquis  received. Indication: Afib  Last office visit: 02/25/24 Danny Kelley)  Scr: 0.89 (02/25/24)  Age: 58 Weight: 96.2kg  Appropriate dose. Refill sent.

## 2024-05-22 ENCOUNTER — Other Ambulatory Visit: Payer: Self-pay | Admitting: Interventional Cardiology

## 2024-05-22 DIAGNOSIS — I25118 Atherosclerotic heart disease of native coronary artery with other forms of angina pectoris: Secondary | ICD-10-CM

## 2024-05-31 DIAGNOSIS — M7552 Bursitis of left shoulder: Secondary | ICD-10-CM | POA: Diagnosis not present

## 2024-05-31 DIAGNOSIS — I2510 Atherosclerotic heart disease of native coronary artery without angina pectoris: Secondary | ICD-10-CM | POA: Diagnosis not present

## 2024-06-10 ENCOUNTER — Ambulatory Visit: Admitting: Cardiology

## 2024-07-11 ENCOUNTER — Other Ambulatory Visit: Payer: Self-pay | Admitting: Cardiology

## 2024-07-15 ENCOUNTER — Other Ambulatory Visit: Payer: Self-pay | Admitting: Medical Genetics

## 2024-07-20 ENCOUNTER — Other Ambulatory Visit: Payer: Self-pay | Admitting: Cardiology

## 2024-08-08 ENCOUNTER — Encounter (INDEPENDENT_AMBULATORY_CARE_PROVIDER_SITE_OTHER): Payer: Self-pay | Admitting: Cardiology

## 2024-08-08 DIAGNOSIS — R55 Syncope and collapse: Secondary | ICD-10-CM

## 2024-08-08 NOTE — Telephone Encounter (Signed)
 Tried to call patient, no answer. Sent mychart message back. Will send message to Dr. Jeffrie for further advisement.

## 2024-08-09 NOTE — Telephone Encounter (Signed)
 Please see the MyChart message reply(ies) for my assessment and plan.    Thank you for the update.  Sorry that you felt poorly, dizzy and ended up at [facility].  Your blood pressure may have been elevated at that time secondary to the anxiety surrounding the fall.  Thankfully, you did not hit your head.  Once you are in the hospital your blood pressure was 126/78.  Initially around fall it was 164 /113.   Sounds like things normalized well.  Would definitely recommend that you notify your primary care physician as well based upon the symptoms.  Continue to monitor your blood pressure.  This patient gave consent for this Medical Advice Message and is aware that it may result in a bill to [employer], as well as the possibility of receiving a bill for a co-payment or deductible. They are an established patient, but are not seeking medical advice exclusively about a problem treated during an in person or video visit in the last seven days. I did not recommend an in person or video visit within seven days of my reply.    I spent a total of 7 minutes cumulative time within 7 days through [employer].  Oneil Parchment, MD

## 2024-09-13 ENCOUNTER — Ambulatory Visit: Attending: Cardiology | Admitting: Cardiology

## 2024-09-13 ENCOUNTER — Encounter: Payer: Self-pay | Admitting: Cardiology

## 2024-09-13 VITALS — BP 154/104 | HR 78 | Ht 69.5 in | Wt 211.2 lb

## 2024-09-13 DIAGNOSIS — I480 Paroxysmal atrial fibrillation: Secondary | ICD-10-CM | POA: Diagnosis not present

## 2024-09-13 DIAGNOSIS — I25118 Atherosclerotic heart disease of native coronary artery with other forms of angina pectoris: Secondary | ICD-10-CM | POA: Diagnosis not present

## 2024-09-13 DIAGNOSIS — I493 Ventricular premature depolarization: Secondary | ICD-10-CM

## 2024-09-13 NOTE — Patient Instructions (Signed)

## 2024-09-13 NOTE — Progress Notes (Signed)
 Danny Kelley                                          MRN: [id number]   09/13/2024   The [facility]BCI Quality Team Specialist reviewed this patient medical record for the purposes of chart review for care gap closure. The following were reviewed: chart review for care gap closure-kidney health evaluation for diabetes:eGFR  and uACR.    VBCI Quality Team

## 2024-09-13 NOTE — Progress Notes (Signed)
 Cardiology Office Note:  .   Date:  09/13/2024  ID:  Danny Kelley, DOB 1965-11-25, MRN [id number] PCP: Patient, No Pcp Per  [facility] Providers Cardiologist:  Oneil Parchment, MD Electrophysiologist:  Will Gladis Norton, MD     History of Present Illness: .   Danny Kelley is a 58 y.o. male Discussed the use of AI scribe   History of Present Illness Danny Kelley is a 58 year old male with hypertension and coronary artery disease who presents for follow-up after a fall and elevated blood pressure.  He experienced dizziness and a fall, leading to a visit to [facility] where his blood pressure was initially 164/113, later recorded at 126/78. He did not hit his head during the fall. He experiences dizziness when bending over, occasionally causing loss of balance.  He has a history of coronary artery disease with previous cardiac catheterizations showing stable stents in the LAD, circumflex, and mild right coronary artery. A spasm was noted in the proximal RCA during a prior catheterization, leading to an increase in isosorbide  to 120 mg daily.  He has persistent atrial fibrillation, previously treated with Multaq  and sotalol , which were ineffective. He has undergone multiple ablations and cardioversions and continues on Eliquis . He also has a history of a TIA in February 2018.  He has type 2 diabetes, which is well controlled. He has made dietary changes to manage his diabetes and is trying to increase physical activity.  He has a history of elevated blood pressure, which has been difficult to manage, requiring adjustments in medication. His current medications include Norvasc  10 mg daily, Praluent  75 mg for cholesterol, Entresto  97/103 mg twice daily, Toprol  100 mg twice daily, Aldactone  25 mg, and hydralazine  as needed, which he rarely uses.  He continues to smoke despite being encouraged to quit.     Studies Reviewed: .           Results DIAGNOSTIC Cardiac  catheterization (04/2023): Stable LAD stent, circumflex stent, mild right coronary stent, spasm in proximal RCA. Risk Assessment/Calculations:           Physical Exam:   VS:  BP (!) 154/104   Pulse 78   Ht 5' 9.5 (1.765 m)   Wt 211 lb 3.2 oz (95.8 kg)   SpO2 97%   BMI 30.74 kg/m    Wt Readings from Last 3 Encounters:  09/13/24 211 lb 3.2 oz (95.8 kg)  03/01/24 212 lb (96.2 kg)  02/25/24 212 lb (96.2 kg)    GEN: Well nourished, well developed in no acute distress NECK: No JVD; No carotid bruits CARDIAC: RRR, no murmurs, no rubs, no gallops RESPIRATORY:  Clear to auscultation without rales, wheezing or rhonchi  ABDOMEN: Soft, non-tender, non-distended EXTREMITIES:  No edema; No deformity   ASSESSMENT AND PLAN: .    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 Isosorbide  mononitrate was increased to manage spasm and angina. No new stents were required, and lesions appeared improved. - Continue isosorbide  mononitrate 120 mg oral daily  Persistent atrial fibrillation on anticoagulation Persistent atrial fibrillation managed with Eliquis . Previous treatments with Multaq  and sotalol  were ineffective. Multiple ablations and cardioversions have been performed. - Continue Eliquis  5 mg oral bid  Type 2 diabetes mellitus, well controlled Diabetes is well controlled with current management. He  has made dietary changes to manage diabetes. - Continue current diabetes management plan  Hypertension, controlled Blood pressure was elevated during the recent fall but normalized at the hospital. Current blood pressure in the office was 154/104, which is acceptable given his history of fluctuating blood pressure. He is on multiple antihypertensive medications. - Continue current  antihypertensive regimen including Norvasc , Entresto , Toprol , Aldactone , and hydralazine  as needed  Hyperlipidemia on PCSK9 inhibitor therapy Currently on Praluent  for hyperlipidemia. Discussed potential insurance coverage issues with Praluent  and the possibility of switching to Repatha  if needed. Previous trial of Repatha  resulted in side effects, but may be reconsidered if necessary. - Continue Praluent  75 mg injection - Will consider Repatha  if Praluent  becomes unaffordable  Tobacco use disorder Continues to smoke despite previous encouragement to quit. - Encouraged smoking cessation         Dispo: 1 yr  Signed, Oneil Parchment, MD

## 2024-09-15 ENCOUNTER — Other Ambulatory Visit: Payer: Self-pay | Admitting: Cardiology

## 2024-09-15 DIAGNOSIS — I25118 Atherosclerotic heart disease of native coronary artery with other forms of angina pectoris: Secondary | ICD-10-CM

## 2024-09-15 DIAGNOSIS — E7800 Pure hypercholesterolemia, unspecified: Secondary | ICD-10-CM

## 2024-09-19 ENCOUNTER — Other Ambulatory Visit (HOSPITAL_COMMUNITY): Payer: Self-pay

## 2024-09-19 ENCOUNTER — Telehealth: Payer: Self-pay | Admitting: Pharmacy Technician

## 2024-09-19 NOTE — Telephone Encounter (Signed)
 Pharmacy Patient Advocate Encounter   Received notification from Fax that prior authorization for praluent  is required/requested.   Insurance verification completed.   The patient is insured through [place].   Per test claim: PA required; PA submitted to above mentioned insurance via Latent Key/confirmation #/EOC [facility] Status is pending

## 2024-09-19 NOTE — Telephone Encounter (Signed)
 Cancelled-   Insurance will not let us  renew until December

## 2024-10-06 ENCOUNTER — Telehealth: Payer: Self-pay | Admitting: Pharmacy Technician

## 2024-10-06 NOTE — Telephone Encounter (Signed)
   Pharmacy Patient Advocate Encounter   Received notification from CoverMyMeds that prior authorization for praluent  is required/requested.   Insurance verification completed.   The patient is insured through [facility].   Per test claim: PA required; PA submitted to above mentioned insurance via Latent Key/confirmation #/EOC [id number] Status is pending

## 2024-10-06 NOTE — Telephone Encounter (Signed)
 Pharmacy Patient Advocate Encounter  Received notification from [facility] that Prior Authorization for praluent  has been APPROVED from 10/06/24 to 11/02/25   PA #/Case ID/Reference #: [id number]

## 2024-10-15 ENCOUNTER — Other Ambulatory Visit: Payer: Self-pay | Admitting: Cardiology

## 2024-10-17 NOTE — Telephone Encounter (Signed)
 Pt last saw Dr Jeffrie 09/13/24, last labs 02/25/24 Creat 0.89, age 58, weight 95.8kg, based on specified criteria pt is on appropriate dosage of Eliquis  5mg  BID for afib.  Will refill rx.

## 2024-11-16 ENCOUNTER — Other Ambulatory Visit: Payer: Self-pay | Admitting: Cardiology

## 2024-11-16 NOTE — Telephone Encounter (Signed)
 In accordance with refill protocols, please review and address the following requirements before this medication refill can be authorized:  Labs

## 2024-12-16 ENCOUNTER — Ambulatory Visit (HOSPITAL_BASED_OUTPATIENT_CLINIC_OR_DEPARTMENT_OTHER): Admitting: Family Medicine
